# Patient Record
Sex: Male | Born: 1950 | Race: White | Hispanic: No | Marital: Single
Health system: Midwestern US, Community
[De-identification: ages and names within clinical notes are randomized; demographics above are authoritative.]

## PROBLEM LIST (undated history)

## (undated) DIAGNOSIS — Z789 Other specified health status: Secondary | ICD-10-CM

## (undated) DIAGNOSIS — Z515 Encounter for palliative care: Secondary | ICD-10-CM

## (undated) DIAGNOSIS — A09 Infectious gastroenteritis and colitis, unspecified: Secondary | ICD-10-CM

## (undated) DIAGNOSIS — C029 Malignant neoplasm of tongue, unspecified: Principal | ICD-10-CM

## (undated) DIAGNOSIS — K9423 Gastrostomy malfunction: Principal | ICD-10-CM

## (undated) DIAGNOSIS — R911 Solitary pulmonary nodule: Secondary | ICD-10-CM

## (undated) DIAGNOSIS — F320 Major depressive disorder, single episode, mild: Secondary | ICD-10-CM

## (undated) DIAGNOSIS — G893 Neoplasm related pain (acute) (chronic): Secondary | ICD-10-CM

## (undated) DIAGNOSIS — R0602 Shortness of breath: Secondary | ICD-10-CM

## (undated) DIAGNOSIS — C069 Malignant neoplasm of mouth, unspecified: Principal | ICD-10-CM

## (undated) DIAGNOSIS — M545 Low back pain, unspecified: Secondary | ICD-10-CM

## (undated) DIAGNOSIS — I10 Essential (primary) hypertension: Secondary | ICD-10-CM

## (undated) DIAGNOSIS — R918 Other nonspecific abnormal finding of lung field: Secondary | ICD-10-CM

## (undated) DIAGNOSIS — G8929 Other chronic pain: Secondary | ICD-10-CM

## (undated) DIAGNOSIS — M79604 Pain in right leg: Secondary | ICD-10-CM

## (undated) DIAGNOSIS — R52 Pain, unspecified: Secondary | ICD-10-CM

## (undated) DIAGNOSIS — C760 Malignant neoplasm of head, face and neck: Secondary | ICD-10-CM

## (undated) DIAGNOSIS — F5101 Primary insomnia: Secondary | ICD-10-CM

## (undated) DIAGNOSIS — H53413 Scotoma involving central area, bilateral: Secondary | ICD-10-CM

## (undated) DIAGNOSIS — R059 Cough, unspecified: Secondary | ICD-10-CM

## (undated) DIAGNOSIS — C049 Malignant neoplasm of floor of mouth, unspecified: Secondary | ICD-10-CM

## (undated) DIAGNOSIS — K148 Other diseases of tongue: Secondary | ICD-10-CM

## (undated) DIAGNOSIS — M79605 Pain in left leg: Secondary | ICD-10-CM

## (undated) DIAGNOSIS — D0007 Carcinoma in situ of tongue: Secondary | ICD-10-CM

## (undated) DIAGNOSIS — E860 Dehydration: Secondary | ICD-10-CM

## (undated) DIAGNOSIS — K1379 Other lesions of oral mucosa: Secondary | ICD-10-CM

## (undated) DIAGNOSIS — F329 Major depressive disorder, single episode, unspecified: Secondary | ICD-10-CM

## (undated) DIAGNOSIS — J432 Centrilobular emphysema: Secondary | ICD-10-CM

## (undated) DIAGNOSIS — Z01818 Encounter for other preprocedural examination: Secondary | ICD-10-CM

## (undated) DIAGNOSIS — Z720 Tobacco use: Secondary | ICD-10-CM

## (undated) DIAGNOSIS — I071 Rheumatic tricuspid insufficiency: Secondary | ICD-10-CM

## (undated) DIAGNOSIS — J449 Chronic obstructive pulmonary disease, unspecified: Secondary | ICD-10-CM

## (undated) DIAGNOSIS — I519 Heart disease, unspecified: Secondary | ICD-10-CM

## (undated) DIAGNOSIS — R011 Cardiac murmur, unspecified: Secondary | ICD-10-CM

## (undated) DIAGNOSIS — I341 Nonrheumatic mitral (valve) prolapse: Secondary | ICD-10-CM

## (undated) DIAGNOSIS — J189 Pneumonia, unspecified organism: Secondary | ICD-10-CM

## (undated) DIAGNOSIS — R05 Cough: Secondary | ICD-10-CM

## (undated) MED FILL — oxyCODONE HCL TAB 10MG: 10 MG | 15 days supply | Qty: 90 | Fill #0 | Status: AC

## (undated) MED FILL — DIPHENOXYLATE-ATROPI 2.5-0.025 TABS: 2.5-0.025 MG | 15 days supply | Qty: 60 | Fill #0 | Status: AC

## (undated) MED FILL — LORAZEPAM 0.5MG TABS: 0.5 MG | 30 days supply | Qty: 30 | Fill #0 | Status: AC

## (undated) MED FILL — SERTRALINE HCL 20MG/ML CONC: 20 MG/ML | 24 days supply | Qty: 60 | Fill #0 | Status: AC

## (undated) MED FILL — SERTRALINE HCL 20MG/ML CONC: 20 MG/ML | 30 days supply | Qty: 60 | Fill #0 | Status: AC

## (undated) MED FILL — DIPHENOXYLATE-ATROPI 2.5-0.025 TABS: 2.5-0.025 MG | 15 days supply | Qty: 60 | Fill #1 | Status: AC

## (undated) MED FILL — SERTRALINE HCL 20MG/ML CONC: 20 MG/ML | 30 days supply | Qty: 60 | Fill #1 | Status: AC

---

## 2014-08-25 ENCOUNTER — Inpatient Hospital Stay (HOSPITAL_COMMUNITY)
Admission: EM | Admit: 2014-08-25 | Discharge: 2014-08-27 | DRG: 871 | Disposition: A | Discharge status: Home / Self Care | Payer: Medicaid Other | Attending: Internal Medicine | Admitting: Internal Medicine

## 2014-08-25 ENCOUNTER — Emergency Department (HOSPITAL_COMMUNITY): Payer: Medicaid Other

## 2014-08-25 ENCOUNTER — Encounter (HOSPITAL_COMMUNITY): Payer: Self-pay | Admitting: Emergency Medicine

## 2014-08-25 DIAGNOSIS — I071 Rheumatic tricuspid insufficiency: Secondary | ICD-10-CM | POA: Diagnosis present

## 2014-08-25 DIAGNOSIS — J441 Chronic obstructive pulmonary disease with (acute) exacerbation: Secondary | ICD-10-CM | POA: Diagnosis present

## 2014-08-25 DIAGNOSIS — R509 Fever, unspecified: Secondary | ICD-10-CM | POA: Diagnosis present

## 2014-08-25 DIAGNOSIS — E860 Dehydration: Secondary | ICD-10-CM | POA: Diagnosis present

## 2014-08-25 DIAGNOSIS — A419 Sepsis, unspecified organism: Secondary | ICD-10-CM | POA: Diagnosis present

## 2014-08-25 DIAGNOSIS — I519 Heart disease, unspecified: Secondary | ICD-10-CM | POA: Diagnosis present

## 2014-08-25 DIAGNOSIS — Z681 Body mass index (BMI) 19 or less, adult: Secondary | ICD-10-CM | POA: Diagnosis not present

## 2014-08-25 DIAGNOSIS — J9600 Acute respiratory failure, unspecified whether with hypoxia or hypercapnia: Secondary | ICD-10-CM | POA: Diagnosis present

## 2014-08-25 DIAGNOSIS — E41 Nutritional marasmus: Secondary | ICD-10-CM | POA: Diagnosis present

## 2014-08-25 DIAGNOSIS — R0600 Dyspnea, unspecified: Secondary | ICD-10-CM

## 2014-08-25 DIAGNOSIS — E871 Hypo-osmolality and hyponatremia: Secondary | ICD-10-CM | POA: Diagnosis present

## 2014-08-25 DIAGNOSIS — D72829 Elevated white blood cell count, unspecified: Secondary | ICD-10-CM

## 2014-08-25 DIAGNOSIS — R6520 Severe sepsis without septic shock: Secondary | ICD-10-CM

## 2014-08-25 DIAGNOSIS — Z23 Encounter for immunization: Secondary | ICD-10-CM | POA: Diagnosis not present

## 2014-08-25 DIAGNOSIS — R011 Cardiac murmur, unspecified: Secondary | ICD-10-CM | POA: Diagnosis present

## 2014-08-25 DIAGNOSIS — R7309 Other abnormal glucose: Secondary | ICD-10-CM | POA: Diagnosis present

## 2014-08-25 DIAGNOSIS — T380X5A Adverse effect of glucocorticoids and synthetic analogues, initial encounter: Secondary | ICD-10-CM | POA: Diagnosis present

## 2014-08-25 DIAGNOSIS — R0602 Shortness of breath: Secondary | ICD-10-CM | POA: Diagnosis not present

## 2014-08-25 DIAGNOSIS — F17200 Nicotine dependence, unspecified, uncomplicated: Secondary | ICD-10-CM | POA: Diagnosis present

## 2014-08-25 DIAGNOSIS — E861 Hypovolemia: Secondary | ICD-10-CM | POA: Diagnosis present

## 2014-08-25 DIAGNOSIS — J189 Pneumonia, unspecified organism: Secondary | ICD-10-CM | POA: Diagnosis present

## 2014-08-25 DIAGNOSIS — I2789 Other specified pulmonary heart diseases: Secondary | ICD-10-CM | POA: Diagnosis present

## 2014-08-25 DIAGNOSIS — D509 Iron deficiency anemia, unspecified: Secondary | ICD-10-CM | POA: Diagnosis present

## 2014-08-25 DIAGNOSIS — I369 Nonrheumatic tricuspid valve disorder, unspecified: Secondary | ICD-10-CM

## 2014-08-25 DIAGNOSIS — D649 Anemia, unspecified: Secondary | ICD-10-CM | POA: Diagnosis present

## 2014-08-25 DIAGNOSIS — E43 Unspecified severe protein-calorie malnutrition: Secondary | ICD-10-CM | POA: Diagnosis present

## 2014-08-25 DIAGNOSIS — I341 Nonrheumatic mitral (valve) prolapse: Secondary | ICD-10-CM

## 2014-08-25 DIAGNOSIS — R739 Hyperglycemia, unspecified: Secondary | ICD-10-CM | POA: Diagnosis present

## 2014-08-25 DIAGNOSIS — J9601 Acute respiratory failure with hypoxia: Secondary | ICD-10-CM

## 2014-08-25 HISTORY — DX: Heart disease, unspecified: I51.9

## 2014-08-25 HISTORY — DX: Nonrheumatic mitral (valve) prolapse: I34.1

## 2014-08-25 HISTORY — DX: Tobacco use: Z72.0

## 2014-08-25 HISTORY — DX: Pneumonia, unspecified organism: J18.9

## 2014-08-25 HISTORY — DX: Rheumatic tricuspid insufficiency: I07.1

## 2014-08-25 HISTORY — DX: Cardiac murmur, unspecified: R01.1

## 2014-08-25 HISTORY — DX: Cough: R05

## 2014-08-25 HISTORY — DX: Cough, unspecified: R05.9

## 2014-08-25 LAB — COMPREHENSIVE METABOLIC PANEL
ALK PHOS: 107 U/L (ref 39–117)
ALT: 27 U/L (ref 0–53)
AST: 29 U/L (ref 0–37)
Albumin: 3.5 g/dL (ref 3.5–5.2)
Anion gap: 13 (ref 5–15)
BILIRUBIN TOTAL: 0.4 mg/dL (ref 0.3–1.2)
BUN: 25 mg/dL — ABNORMAL HIGH (ref 6–23)
CHLORIDE: 100 meq/L (ref 96–112)
CO2: 23 mEq/L (ref 19–32)
Calcium: 8.7 mg/dL (ref 8.4–10.5)
Creatinine, Ser: 1.02 mg/dL (ref 0.50–1.35)
GFR calc Af Amer: 89 mL/min — ABNORMAL LOW (ref >90)
GFR calc non Af Amer: 77 mL/min — ABNORMAL LOW (ref >90)
Glucose, Bld: 137 mg/dL — ABNORMAL HIGH (ref 70–99)
Potassium: 3.9 mEq/L (ref 3.7–5.3)
SODIUM: 136 meq/L — AB (ref 137–147)
TOTAL PROTEIN: 7.8 g/dL (ref 6.0–8.3)

## 2014-08-25 LAB — CBC WITH DIFFERENTIAL/PLATELET
BASOS PCT: 0 % (ref 0–1)
Basophils Absolute: 0.1 10*3/uL (ref 0.0–0.1)
EOS ABS: 0.1 10*3/uL (ref 0.0–0.7)
Eosinophils Relative: 0 % (ref 0–5)
HCT: 35.7 % — ABNORMAL LOW (ref 39.0–52.0)
HEMOGLOBIN: 12.2 g/dL — AB (ref 13.0–17.0)
LYMPHS ABS: 1.4 10*3/uL (ref 0.7–4.0)
Lymphocytes Relative: 8 % — ABNORMAL LOW (ref 12–46)
MCH: 32.4 pg (ref 26.0–34.0)
MCHC: 34.2 g/dL (ref 30.0–36.0)
MCV: 94.7 fL (ref 78.0–100.0)
Monocytes Absolute: 1 10*3/uL (ref 0.1–1.0)
Monocytes Relative: 6 % (ref 3–12)
NEUTROS PCT: 86 % — AB (ref 43–77)
Neutro Abs: 15.1 10*3/uL — ABNORMAL HIGH (ref 1.7–7.7)
PLATELETS: 351 10*3/uL (ref 150–400)
RBC: 3.77 MIL/uL — AB (ref 4.22–5.81)
RDW: 13.7 % (ref 11.5–15.5)
WBC: 17.6 10*3/uL — ABNORMAL HIGH (ref 4.0–10.5)

## 2014-08-25 LAB — URINALYSIS, ROUTINE W REFLEX MICROSCOPIC
BILIRUBIN URINE: NEGATIVE
Glucose, UA: 100 mg/dL — AB
HGB URINE DIPSTICK: NEGATIVE
Ketones, ur: NEGATIVE mg/dL
Leukocytes, UA: NEGATIVE
Nitrite: NEGATIVE
PROTEIN: NEGATIVE mg/dL
SPECIFIC GRAVITY, URINE: 1.01 (ref 1.005–1.030)
UROBILINOGEN UA: 0.2 mg/dL (ref 0.0–1.0)
pH: 5.5 (ref 5.0–8.0)

## 2014-08-25 LAB — HEMOGLOBIN A1C
HEMOGLOBIN A1C: 5.6 % (ref <5.7)
MEAN PLASMA GLUCOSE: 114 mg/dL (ref <117)

## 2014-08-25 LAB — TROPONIN I: Troponin I: <0.3 ng/mL (ref <0.30)

## 2014-08-25 LAB — TSH: TSH: 1.74 u[IU]/mL (ref 0.350–4.500)

## 2014-08-25 LAB — GLUCOSE, CAPILLARY
GLUCOSE-CAPILLARY: 196 mg/dL — AB (ref 70–99)
Glucose-Capillary: 275 mg/dL — ABNORMAL HIGH (ref 70–99)

## 2014-08-25 LAB — LACTIC ACID, PLASMA: LACTIC ACID, VENOUS: 1.7 mmol/L (ref 0.5–2.2)

## 2014-08-25 LAB — PRO B NATRIURETIC PEPTIDE: Pro B Natriuretic peptide (BNP): 656.8 pg/mL — ABNORMAL HIGH (ref 0–125)

## 2014-08-25 LAB — MRSA PCR SCREENING: MRSA by PCR: POSITIVE — AB

## 2014-08-25 MED ORDER — SODIUM CHLORIDE 0.9 % IV SOLN
INTRAVENOUS | Status: AC
  Administered 2014-08-25: 11:00:00 via INTRAVENOUS

## 2014-08-25 MED ORDER — ONDANSETRON HCL 4 MG PO TABS
4.0000 mg | ORAL_TABLET | Freq: Four times a day (QID) | ORAL | Status: DC | PRN
  Administered 2014-08-26: 4 mg via ORAL
  Filled 2014-08-25: qty 1

## 2014-08-25 MED ORDER — ALBUTEROL SULFATE (2.5 MG/3ML) 0.083% IN NEBU
2.5000 mg | INHALATION_SOLUTION | RESPIRATORY_TRACT | Status: DC | PRN
  Administered 2014-08-25: 2.5 mg via RESPIRATORY_TRACT
  Filled 2014-08-25: qty 3

## 2014-08-25 MED ORDER — SALINE SPRAY 0.65 % NA SOLN: 1.0000 | NASAL | Status: DC | PRN

## 2014-08-25 MED ORDER — SODIUM CHLORIDE 0.9 % IV BOLUS (SEPSIS)
1000.0000 mL | Freq: Once | INTRAVENOUS | Status: AC
  Administered 2014-08-25: 1000 mL via INTRAVENOUS

## 2014-08-25 MED ORDER — ACETAMINOPHEN 325 MG PO TABS
650.0000 mg | ORAL_TABLET | Freq: Four times a day (QID) | ORAL | Status: DC | PRN
  Administered 2014-08-25 – 2014-08-27 (×2): 650 mg via ORAL
  Filled 2014-08-25 (×2): qty 2

## 2014-08-25 MED ORDER — ENOXAPARIN SODIUM 30 MG/0.3ML ~~LOC~~ SOLN
30.0000 mg | SUBCUTANEOUS | Status: DC
  Filled 2014-08-25 (×2): qty 0.3

## 2014-08-25 MED ORDER — PREDNISONE 20 MG PO TABS
40.0000 mg | ORAL_TABLET | Freq: Two times a day (BID) | ORAL | Status: DC
  Administered 2014-08-25 – 2014-08-26 (×2): 40 mg via ORAL
  Filled 2014-08-25 (×2): qty 2

## 2014-08-25 MED ORDER — BENZONATATE 100 MG PO CAPS
100.0000 mg | ORAL_CAPSULE | Freq: Three times a day (TID) | ORAL | Status: DC | PRN
  Administered 2014-08-25: 100 mg via ORAL
  Filled 2014-08-25: qty 1

## 2014-08-25 MED ORDER — ALUM & MAG HYDROXIDE-SIMETH 200-200-20 MG/5ML PO SUSP
30.0000 mL | Freq: Four times a day (QID) | ORAL | Status: DC | PRN
  Administered 2014-08-25 – 2014-08-27 (×3): 30 mL via ORAL
  Filled 2014-08-25 (×3): qty 30

## 2014-08-25 MED ORDER — LEVOFLOXACIN IN D5W 750 MG/150ML IV SOLN: 750.0000 mg | INTRAVENOUS | Status: DC

## 2014-08-25 MED ORDER — DEXAMETHASONE SODIUM PHOSPHATE 10 MG/ML IJ SOLN
8.0000 mg | Freq: Once | INTRAMUSCULAR | Status: AC
  Administered 2014-08-25: 8 mg via INTRAVENOUS
  Filled 2014-08-25: qty 1

## 2014-08-25 MED ORDER — OXYMETAZOLINE HCL 0.05 % NA SOLN
NASAL | Status: AC
  Filled 2014-08-25: qty 15

## 2014-08-25 MED ORDER — IPRATROPIUM-ALBUTEROL 0.5-2.5 (3) MG/3ML IN SOLN
3.0000 mL | Freq: Once | RESPIRATORY_TRACT | Status: AC
  Administered 2014-08-25: 3 mL via RESPIRATORY_TRACT
  Filled 2014-08-25: qty 3

## 2014-08-25 MED ORDER — NICOTINE 21 MG/24HR TD PT24
21.0000 mg | MEDICATED_PATCH | Freq: Every day | TRANSDERMAL | Status: DC
  Administered 2014-08-25 – 2014-08-27 (×3): 21 mg via TRANSDERMAL
  Filled 2014-08-25 (×3): qty 1

## 2014-08-25 MED ORDER — MUPIROCIN 2 % EX OINT
1.0000 "application " | TOPICAL_OINTMENT | Freq: Two times a day (BID) | CUTANEOUS | Status: DC
  Administered 2014-08-25 – 2014-08-27 (×4): 1 via NASAL
  Filled 2014-08-25: qty 22

## 2014-08-25 MED ORDER — BISACODYL 10 MG RE SUPP: 10.0000 mg | Freq: Every day | RECTAL | Status: DC | PRN

## 2014-08-25 MED ORDER — ACETAMINOPHEN 650 MG RE SUPP: 650.0000 mg | Freq: Four times a day (QID) | RECTAL | Status: DC | PRN

## 2014-08-25 MED ORDER — CHLORHEXIDINE GLUCONATE CLOTH 2 % EX PADS
6.0000 | MEDICATED_PAD | Freq: Every day | CUTANEOUS | Status: DC
  Administered 2014-08-26 – 2014-08-27 (×2): 6 via TOPICAL

## 2014-08-25 MED ORDER — LEVOFLOXACIN IN D5W 750 MG/150ML IV SOLN
750.0000 mg | Freq: Once | INTRAVENOUS | Status: AC
  Administered 2014-08-25: 750 mg via INTRAVENOUS
  Filled 2014-08-25: qty 150

## 2014-08-25 MED ORDER — ACETAMINOPHEN 500 MG PO TABS
1000.0000 mg | ORAL_TABLET | Freq: Once | ORAL | Status: AC
  Administered 2014-08-25: 1000 mg via ORAL
  Filled 2014-08-25: qty 2

## 2014-08-25 MED ORDER — HYDROCODONE-ACETAMINOPHEN 5-325 MG PO TABS
1.0000 | ORAL_TABLET | ORAL | Status: DC | PRN
  Administered 2014-08-26 (×2): 1 via ORAL
  Filled 2014-08-25 (×2): qty 1

## 2014-08-25 MED ORDER — INSULIN ASPART 100 UNIT/ML ~~LOC~~ SOLN
0.0000 [IU] | Freq: Three times a day (TID) | SUBCUTANEOUS | Status: DC
  Administered 2014-08-25: 8 [IU] via SUBCUTANEOUS
  Administered 2014-08-26 (×2): 3 [IU] via SUBCUTANEOUS
  Administered 2014-08-26: 2 [IU] via SUBCUTANEOUS

## 2014-08-25 MED ORDER — ENOXAPARIN SODIUM 40 MG/0.4ML ~~LOC~~ SOLN: 40.0000 mg | SUBCUTANEOUS | Status: DC

## 2014-08-25 MED ORDER — INSULIN ASPART 100 UNIT/ML ~~LOC~~ SOLN
0.0000 [IU] | Freq: Every day | SUBCUTANEOUS | Status: DC
  Administered 2014-08-25: 3 [IU] via SUBCUTANEOUS

## 2014-08-25 MED ORDER — ONDANSETRON HCL 4 MG/2ML IJ SOLN: 4.0000 mg | Freq: Four times a day (QID) | INTRAMUSCULAR | Status: DC | PRN

## 2014-08-25 MED ORDER — INSULIN DETEMIR 100 UNIT/ML ~~LOC~~ SOLN
5.0000 [IU] | Freq: Two times a day (BID) | SUBCUTANEOUS | Status: DC
  Administered 2014-08-25 – 2014-08-26 (×3): 5 [IU] via SUBCUTANEOUS
  Filled 2014-08-25 (×8): qty 0.05

## 2014-08-25 MED ORDER — ALBUTEROL SULFATE (2.5 MG/3ML) 0.083% IN NEBU
2.5000 mg | INHALATION_SOLUTION | RESPIRATORY_TRACT | Status: DC | PRN
Start: 2014-08-25 — End: 2014-08-27

## 2014-08-25 MED ORDER — OXYMETAZOLINE HCL 0.05 % NA SOLN
1.0000 | Freq: Two times a day (BID) | NASAL | Status: DC | PRN
  Administered 2014-08-25: 1 via NASAL
  Filled 2014-08-25: qty 15

## 2014-08-25 MED ORDER — IPRATROPIUM-ALBUTEROL 0.5-2.5 (3) MG/3ML IN SOLN
3.0000 mL | RESPIRATORY_TRACT | Status: DC
  Administered 2014-08-25 – 2014-08-26 (×7): 3 mL via RESPIRATORY_TRACT
  Filled 2014-08-25 (×7): qty 3

## 2014-08-25 MED ORDER — TRAZODONE HCL 50 MG PO TABS
25.0000 mg | ORAL_TABLET | Freq: Every evening | ORAL | Status: DC | PRN
  Administered 2014-08-26 (×2): 25 mg via ORAL
  Filled 2014-08-25 (×2): qty 1

## 2014-08-25 MED ORDER — SODIUM CHLORIDE 0.9 % IV SOLN
INTRAVENOUS | Status: DC
  Administered 2014-08-26 – 2014-08-27 (×2): via INTRAVENOUS

## 2014-08-25 NOTE — ED Provider Notes (Signed)
CSN: [id number]     Arrival date & time 08/25/14  4098 History  This chart was scribed for Enid Skeens, MD by Littie Deeds, ED Scribe. This patient was seen in room APA11/APA11 and the patient's care was started at 8:07 AM.     Chief Complaint  Patient presents with  . Shortness of Breath      Patient is a 63 y.o. male presenting with shortness of breath. The history is provided by the patient. No language interpreter was used.  Shortness of Breath Severity:  Severe Onset quality:  Gradual Duration:  1 month Timing:  Constant Progression:  Worsening Worsened by:  Exertion Associated symptoms: cough, diaphoresis and fever   Associated symptoms: no abdominal pain, no chest pain, no headaches and no sore throat   Risk factors: tobacco use    HPI Comments: Vincent Robertson is a 63 y.o. male who presents to the Emergency Department complaining of gradually worsening, severe, constant, SOB that started a month ago, but has worsened the past two days. He notes associated chills, fever, rhinorrhea, diaphoresis, throbbing back pain, sore throat, weakness and productive cough with greenish yellow sputum. Patient states that SOB is worsened with activity. Patient has not been seen by a PCP in 15 years. He denies Hx of lung dz, surgery, CA, and blood clots.  He denies CP, leg edema and abdominal pain. Patient has been smoking for about 40 years.   History reviewed. No pertinent past medical history. History reviewed. No pertinent past surgical history. History reviewed. No pertinent family history. History  Substance Use Topics  . Smoking status: Current Some Day Smoker -- 0.50 packs/day    Types: Cigarettes  . Smokeless tobacco: Not on file  . Alcohol Use: Yes    Review of Systems  Constitutional: Positive for fever, chills, diaphoresis and fatigue.  HENT: Positive for rhinorrhea. Negative for sore throat.   Eyes: Negative for visual disturbance.  Respiratory: Positive for cough  and shortness of breath.   Cardiovascular: Negative for chest pain.  Gastrointestinal: Negative for abdominal pain.  Musculoskeletal: Positive for back pain.  Neurological: Positive for light-headedness. Negative for headaches.  All other systems reviewed and are negative.     Allergies  Review of patient's allergies indicates no known allergies.  Home Medications   Prior to Admission medications   Not on File   BP 142/106  Pulse 117  Temp(Src) 98.8 F (37.1 C) (Oral)  Resp 24  Ht  (1.676 m)  Wt 100 lb (45.36 kg)  BMI 16.15 kg/m2  SpO2 92% Physical Exam  Nursing note and vitals reviewed. Constitutional: He is oriented to person, place, and time. He appears well-developed and well-nourished. No distress.  HENT:  Head: Normocephalic and atraumatic.  Mouth/Throat: No oropharyngeal exudate.  Mild dry mucous membranes  Eyes: Pupils are equal, round, and reactive to light.  Neck: Neck supple.  Cardiovascular: Normal rate.   Murmur heard. 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 He has wheezes. He has rales.  Mild tachypnea, mild decreased air movement bilaterally  Musculoskeletal: He exhibits no edema and no tenderness.  no calf tenderness edema bilaterally  Neurological: He is alert and oriented to person, place, and time. No cranial nerve deficit.  Skin: Skin is warm and dry. No rash noted.  Psychiatric: He has a normal mood and affect. His behavior is normal.    ED Course  Procedures    EMERGENCY DEPARTMENT [place] CARDIAC EXAM "Study: Limited Ultrasound of the heart and pericardium"  INDICATIONS:Dyspnea Multiple views of the heart and pericardium were obtained in real-time with a multi-frequency probe.  PERFORMED ZO:XWRUEA  IMAGES ARCHIVED?: Yes  FINDINGS: No pericardial effusion, Hyperdynamic contractility and Tamponade physiology absent  LIMITATIONS:   body habitus  VIEWS USED: Subcostal 4 chamber, Parasternal long axis, Parasternal short axis, Apical 4 chamber  and Inferior Vena Cava  INTERPRETATION: Cardiac activity present, Pericardial effusioin absent, Cardiac tamponade absent, Probable low CVP and Increased contractility   DIAGNOSTIC STUDIES: Oxygen Saturation is 92% on RA, adequate by my interpretation.    COORDINATION OF CARE: 8:19 AM-Discussed treatment plan which includes breathing Tx, labs, CXR with pt at bedside and pt agreed to plan.     Labs Review Labs Reviewed  CBC WITH DIFFERENTIAL - Abnormal; Notable for the following:    WBC 17.6 (*)    RBC 3.77 (*)    Hemoglobin 12.2 (*)    HCT 35.7 (*)    Neutrophils Relative % 86 (*)    Neutro Abs 15.1 (*)    Lymphocytes Relative 8 (*)    All other components within normal limits  COMPREHENSIVE METABOLIC PANEL - Abnormal; Notable for the following:    Sodium 136 (*)    Glucose, Bld 137 (*)    BUN 25 (*)    GFR calc non Af Amer 77 (*)    GFR calc Af Amer 89 (*)    All other components within normal limits  PRO B NATRIURETIC PEPTIDE - Abnormal; Notable for the following:    Pro B Natriuretic peptide (BNP) 656.8 (*)    All other components within normal limits  TROPONIN I  LACTIC ACID, PLASMA  LACTIC ACID, PLASMA  I-STAT CG4 LACTIC ACID, ED    Imaging Review Dg Chest 2 View  08/25/2014   CLINICAL DATA:  Cough, congestion  EXAM: CHEST  2 VIEW  COMPARISON:  None.  FINDINGS: Cardiomediastinal silhouette is unremarkable. Hyperinflation is noted. Mild perihilar increased bronchial markings. There is patchy infiltrate/pneumonia in left upper lobe. Follow-up to resolution is recommended.  IMPRESSION: Hyperinflation. Patchy infiltrate/ pneumonia in left upper lobe. Follow-up to resolution is recommended.   Electronically Signed   By: Natasha Mead M.D.   On: 08/25/2014 09:28     EKG Interpretation   Date/Time:  Thursday August 25 2014 09:05:25 EDT Ventricular Rate:   92 PR Interval:  112 QRS Duration: 94 QT Interval:  353 QTC Calculation: 437 R Axis:   157 Text Interpretation:  Sinus rhythm Borderline short PR interval Probable  left atrial enlargement Consider left ventricular hypertrophy  Probable   LVH Baseline wander in lead(s) V4 V5 V6 Confirmed by Theia Dezeeuw  MD, Loria Lacina  ([id number]) on 08/25/2014 9:11:35 AM      MDM   Final diagnoses:  CAP (community acquired pneumonia)  Acute dyspnea  Heart murmur  Sepsis due to pneumonia   Clinically patient has not seen a physician in many years presents with clinically underlying lung disease and bronchitis/pneumonia. Plan for blood work, chest x-ray, afebrile however tachycardic. With exertional shortness of breath, age, smoker and murmur since childhood plan for evaluation for signs of heart failure as well and cardiac strain.  No blood clot risk factors.  Patient has significant murmur left lower sternal border holosystolic with possible diastolic component. Patient says she's had a unique murmur for years and has been evaluated in the past. No current cardiologist. No heart failure  known, patient denies orthopnea or weight gain. Bedside ultrasound showed hyperdynamic heart with likely mitral regurgitation, grossly normal ejection fraction, IVC collapsing with respirations.  Chest x-ray reviewed by myself left upper infiltrate concerning for pneumonia with chronic lung disease appearance. Patient had improvement on recheck however still has mild tachypnea, mild low oxygenation in with likely lung disease and heart murmur plan for admission for further evaluation and treatment of pneumonia/sepsis.  Discussed with Dr. Sherrie Mustache who agrees with plan and stepped-down placed.  The patients results and plan were reviewed and discussed.   Any x-rays performed were personally reviewed by myself.   Differential diagnosis were considered with the presenting HPI.  Medications  levofloxacin (LEVAQUIN) IVPB 750 mg (750 mg  Intravenous New Bag/Given 08/25/14 1011)  0.9 %  sodium chloride infusion ( Intravenous New Bag/Given 08/25/14 1115)  albuterol (PROVENTIL) (2.5 MG/3ML) 0.083% nebulizer solution 2.5 mg (not administered)  ipratropium-albuterol (DUONEB) 0.5-2.5 (3) MG/3ML nebulizer solution 3 mL (not administered)  ipratropium-albuterol (DUONEB) 0.5-2.5 (3) MG/3ML nebulizer solution 3 mL (3 mLs Nebulization Given 08/25/14 0843)  dexamethasone (DECADRON) injection 8 mg (8 mg Intravenous Given 08/25/14 0836)  sodium chloride 0.9 % bolus 1,000 mL (0 mLs Intravenous Stopped 08/25/14 1114)  acetaminophen (TYLENOL) tablet 1,000 mg (1,000 mg Oral Given 08/25/14 1041)    Filed Vitals:   08/25/14 0951 08/25/14 0957 08/25/14 1041 08/25/14 1100  BP: 159/67 145/65 146/66 136/63  Pulse: 92 89 91 81  Temp:  102.7 F (39.3 C)    TempSrc:  Rectal    Resp: Height:      Weight:      SpO2: 94%  95% 96%    Final diagnoses:  CAP (community acquired pneumonia)  Acute dyspnea  Heart murmur  Sepsis due to pneumonia    Admission/ observation were discussed with the admitting physician, patient and/or family and they are comfortable with the plan.     Enid Skeens, MD 08/25/14 [phone]

## 2014-08-25 NOTE — ED Notes (Signed)
Fever of 102 Rectal. Warm blankets removed from pt and sheet given to pt. NAD.

## 2014-08-25 NOTE — H&P (Signed)
The patient was seen and examined. His medical record, vital signs, and laboratory studies were reviewed. He was discussed with nurse practitioner Ms. Black. I agree with her findings with additions below.  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 He also has a history of smoking one to 2 packs of cigarettes daily. He is being admitted for community-acquired pneumonia and COPD with exacerbation. Levaquin and dual nebs have been started. He was given a small dose of Decadron in the ED, but we will start a prednisone taper. We will add a nicotine patch and tobacco cessation counseling. He was instructed to stop smoking. We'll add when necessary Tessalon Perles. 2-D echocardiogram has been ordered for evaluation of a heart murmur.

## 2014-08-25 NOTE — Progress Notes (Signed)
ANTIBIOTIC CONSULT NOTE  Pharmacy Consult for Levaquin Indication: pneumonia  No Known Allergies  Patient Measurements: Height:  (167.6 cm) Weight: 92 lb 9.5 oz (42 kg) IBW/kg (Calculated) : 63.8  Vital Signs: Temp: 98.2 F (36.8 C) (09/17 1227) Temp src: Oral (09/17 1227) BP: 136/63 mmHg (09/17 1100) Pulse Rate: 90 (09/17 1130) Intake/Output from previous day:   Intake/Output from this shift: Total I/O In: 1000 [I.V.:1000] Out: -   Labs:  Recent Labs  08/25/14 0838  WBC 17.6*  HGB 12.2*  PLT 351  CREATININE 1.02   Estimated Creatinine Clearance: 44.6 ml/min (by C-G formula based on Cr of 1.02). No results found for this basename: VANCOTROUGH, VANCOPEAK, VANCORANDOM, GENTTROUGH, GENTPEAK, GENTRANDOM, TOBRATROUGH, TOBRAPEAK, TOBRARND, AMIKACINPEAK, AMIKACINTROU, AMIKACIN,  in the last 72 hours   Microbiology: No results found for this or any previous visit (from the past 720 hour(s)).  Anti-infectives   Start     Dose/Rate Route Frequency Ordered Stop   08/25/14 1000  levofloxacin (LEVAQUIN) IVPB 750 mg     750 mg 100 mL/hr over 90 Minutes Intravenous  Once 08/25/14 1610 08/25/14 1142     Assessment: Sepsis: Likely related to community-acquired pneumonia in the setting of probable COPD exacerbation and a heavy smoker  Goal of Therapy:  Eradicate infection.   Plan:  Levaquin  IV every 48 hours. Follow up culture results  Mady Gemma 08/25/2014,12:57 PM

## 2014-08-25 NOTE — ED Notes (Signed)
Pt co productive cough with green sputum, heart racing, and cold chills x3 days.

## 2014-08-25 NOTE — Progress Notes (Signed)
  Echocardiogram 2D Echocardiogram has been performed.  Vincent Robertson 08/25/2014, 4:03 PM 

## 2014-08-25 NOTE — H&P (Signed)
[facility] History and Physical  Vincent Robertson ZOX:[id number] DOB: May 13, 1951 DOA: 08/25/2014  Referring physician:  PCP: No PCP Per Patient   Chief Complaint: Worsening shortness of breath  HPI: Vincent Robertson is a 63 y.o. male who has a history of smoking and no medical care for over 10 years presents to the emergency department with the chief complaint of worsening dyspnea on exertion. Initial evaluation in the emergency room reveals sepsis related to community acquired pneumonia in the setting of likely COPD exacerbation resulting resulting in acute respiratory failure.  Patient reports a gradual worsening of dyspnea on exertion over the last 4-5 weeks. Over the last 2 days DOB developed into shortness of breath at rest with dry nonproductive cough. Associated symptoms include chills, subjective fever and generalized weakness. He denies chest pain palpitations headache dizziness syncope or near-syncope. He denies any abdominal pain nausea vomiting diarrhea constipation or melena. He denies dysuria hematuria frequency or urgency. He denies lower extremity edema but describes an intermittent orthopnea. He denies any unintentional weight loss  Workup in the emergency room reveals a chest x-ray with Hyperinflation. Patchy infiltrate/ pneumonia in left upper lobe, leukocytosis of 17.6 with 86% relative neutrophils, proBNP of 656.8, lactic acid 1.7. Basic metabolic panel with sodium and 136 BUN and 25 serum glucose 137. Initial troponin negative. EKG with normal sinus rhythm and probable left ventricular hypertrophy. Vital signs reveal a rectal temperature of 102.7. He has hemodynamically stable and mildly hypoxic with tachypnea.  He is provided with 2 L of normal saline, 80 mg of Decadron intravenously, nebulizer and Levaquin. The time of my exam he is nontoxic appearing  Review of Systems:  10 point review of systems completed and all systems are negative except as indicated in  the history of present illness   Past Medical History  Diagnosis Date  . Tobacco use   . Murmur   . Cough    History reviewed. No pertinent past surgical history. Social History:  reports that he has been smoking Cigarettes.  He has been smoking about 0.50 packs per day. He does not have any smokeless tobacco history on file. He reports that he drinks alcohol. His drug history is not on file.  No Known Allergies  History reviewed. No pertinent family history.   Prior to Admission medications   Medication Sig Start Date End Date Taking? Authorizing Provider  OVER THE COUNTER MEDICATION Take 30 mLs by mouth every 6 (six) hours as needed (cough). OTC cough medication   Yes Historical Provider, MD   Physical Exam: Filed Vitals:   08/25/14 1115 08/25/14 1130 08/25/14 1137 08/25/14 1149  BP:      Pulse: 83 90    Temp:   97.8 F (36.6 C)   TempSrc:   Oral   Resp: 25 22    Height:      Weight:      SpO2: 95% 96%  97%    Wt Readings from Last 3 Encounters:  08/25/14 45.36 kg (100 lb)    General:  Appears calm and comfortable . In Eyes: PERRL, normal lids, irises & conjunctiva ENT: grossly normal hearing, very poor dentition mucous membranes of his mouth slightly pale slightly dry Neck: no LAD, masses or thyromegaly Cardiovascular: S1 and S2 +murmur no gallup or rub no lower extremity pedal pulses are present and palpable Respiratory: Normal effort breath sounds very diminished. Faint wheezes diffusely. No Abdomen: soft, flat positive bowel sounds throughout nontender to palpate Skin: no rash or  induration seen on limited exam Musculoskeletal: grossly normal tone BUE/BLE Psychiatric: grossly normal mood and affect, speech fluent and appropriate Neurologic: grossly non-focal. Speech clear facial symmetry           Labs on Admission:  Basic Metabolic Panel:  Recent Labs Lab 08/25/14 0838  NA 136*  K 3.9  CL 100  CO2 23  GLUCOSE 137*  BUN 25*  CREATININE 1.02    CALCIUM 8.7   Liver Function Tests:  Recent Labs Lab 08/25/14 0838  AST 29  ALT 27  ALKPHOS 107  BILITOT 0.4  PROT 7.8  ALBUMIN 3.5   No results found for this basename: LIPASE, AMYLASE,  in the last 168 hours No results found for this basename: AMMONIA,  in the last 168 hours CBC:  Recent Labs Lab 08/25/14 0838  WBC 17.6*  NEUTROABS 15.1*  HGB 12.2*  HCT 35.7*  MCV 94.7  PLT 351   Cardiac Enzymes:  Recent Labs Lab 08/25/14 0838  TROPONINI <0.30    BNP (last 3 results)  Recent Labs  08/25/14 0838  PROBNP 656.8*   CBG: No results found for this basename: GLUCAP,  in the last 168 hours  Radiological Exams on Admission: Dg Chest 2 View  08/25/2014   CLINICAL DATA:  Cough, congestion  EXAM: CHEST  2 VIEW  COMPARISON:  None.  FINDINGS: Cardiomediastinal silhouette is unremarkable. Hyperinflation is noted. Mild perihilar increased bronchial markings. There is patchy infiltrate/pneumonia in left upper lobe. Follow-up to resolution is recommended.  IMPRESSION: Hyperinflation. Patchy infiltrate/ pneumonia in left upper lobe. Follow-up to resolution is recommended.   Electronically Signed   By: Natasha Mead M.D.   On: 08/25/2014 09:28    EKG: Independently reviewed. SR with LVH  Assessment/Plan Principal Problem:   Sepsis: Likely related to community-acquired pneumonia in the setting of probable COPD exacerbation and a heavy smoker. Patient with a history of heart murmur so some concern for some degree of heart failure. At the time of my exam he is afebrile. He has remained hemodynamically stable and only mildly hypoxic.Tachypnea  much improved. Will obtain blood cultures urine cultures sputum cultures. Will obtain Legionella urine antigen and strep pneumo urinary antigen. Will also obtain 2-D echo for further evaluation of murmur and reported prolapse valve. Will provide supportive therapy in the form of IV fluids and antibiotics as well as oxygen supplementation as  indicated. Admitted to step down unit for close monitoring Active Problems:    CAP (community acquired pneumonia): See 1. Will obtain blood cultures sputum cultures. Continue Levaquin that was initiated in the emergency department. Check strep pneumo urinary antigen and Legionella urine antigen. Surgeon supplementation as indicated.   COPD exacerbation: A heavy smoker. He hasn't been to the doctor in many years but likely chronic disease due to smoking. Will provide scheduled nebulizer treatment. Antibiotics as indicated above. He was provided with Decadron in the emergency department. Will consider prednisone if no improvement. He will likely need pulmonary function tests in the outpatient basis.     Acute respiratory failure with hypoxia: Rated to #2 and #3. Hypoxia mild. Improved at the time of my exam after nabs and Decadron provided in the emergency department. Oxygen saturation levels 93% on 2 L of nasal cannula. See therapy for #12 and 3. Monitor closely on the step down unit. Patient what he has described he may be in need of oxygen on the outpatient basis with activity. Once acute phase of illness resolved we'll conduct oxygen trials  Systolic murmur: Patient reports history of same. Will obtain a 2-D echo for further evaluation.    Fever, unspecified: See #1. Tylenol as indicated. Await blood cultures    Hyponatremia: Mild. Likely do to decreased by mouth intake over the last 2 days. Will gently hydrate with IV fluids. Will recheck in the morning    Dehydration: Patient reports decreased by mouth intake on the last 2-3 days. Will provide IV fluids.    Hyperglycemia: I clearly related to infectious process. No history of diabetes. Will check A1c. Monitor CBGs    Tobacco use disorder: Cessation counseling offered    Code Status: full DVT Prophylaxis: Family Communication: fiance at bedside Disposition Plan: home when ready  Time spent: 65 minutes  [facility] Pager [phone]

## 2014-08-26 ENCOUNTER — Encounter (HOSPITAL_COMMUNITY): Payer: Self-pay | Admitting: Internal Medicine

## 2014-08-26 DIAGNOSIS — J9600 Acute respiratory failure, unspecified whether with hypoxia or hypercapnia: Secondary | ICD-10-CM

## 2014-08-26 DIAGNOSIS — I341 Nonrheumatic mitral (valve) prolapse: Secondary | ICD-10-CM

## 2014-08-26 DIAGNOSIS — D649 Anemia, unspecified: Secondary | ICD-10-CM

## 2014-08-26 DIAGNOSIS — I071 Rheumatic tricuspid insufficiency: Secondary | ICD-10-CM

## 2014-08-26 DIAGNOSIS — I519 Heart disease, unspecified: Secondary | ICD-10-CM

## 2014-08-26 HISTORY — DX: Nonrheumatic mitral (valve) prolapse: I34.1

## 2014-08-26 HISTORY — DX: Heart disease, unspecified: I51.9

## 2014-08-26 HISTORY — DX: Rheumatic tricuspid insufficiency: I07.1

## 2014-08-26 LAB — CBC
HCT: 31 % — ABNORMAL LOW (ref 39.0–52.0)
HEMOGLOBIN: 10.4 g/dL — AB (ref 13.0–17.0)
MCH: 31.9 pg (ref 26.0–34.0)
MCHC: 33.5 g/dL (ref 30.0–36.0)
MCV: 95.1 fL (ref 78.0–100.0)
Platelets: 317 10*3/uL (ref 150–400)
RBC: 3.26 MIL/uL — ABNORMAL LOW (ref 4.22–5.81)
RDW: 13.8 % (ref 11.5–15.5)
WBC: 19.1 10*3/uL — ABNORMAL HIGH (ref 4.0–10.5)

## 2014-08-26 LAB — COMPREHENSIVE METABOLIC PANEL
ALK PHOS: 96 U/L (ref 39–117)
ALT: 20 U/L (ref 0–53)
AST: 18 U/L (ref 0–37)
Albumin: 2.8 g/dL — ABNORMAL LOW (ref 3.5–5.2)
Anion gap: 12 (ref 5–15)
BUN: 21 mg/dL (ref 6–23)
CO2: 21 meq/L (ref 19–32)
Calcium: 7.9 mg/dL — ABNORMAL LOW (ref 8.4–10.5)
Chloride: 105 mEq/L (ref 96–112)
Creatinine, Ser: 0.95 mg/dL (ref 0.50–1.35)
GFR, EST NON AFRICAN AMERICAN: 87 mL/min — AB (ref >90)
Glucose, Bld: 167 mg/dL — ABNORMAL HIGH (ref 70–99)
POTASSIUM: 3.9 meq/L (ref 3.7–5.3)
SODIUM: 138 meq/L (ref 137–147)
Total Bilirubin: 0.2 mg/dL — ABNORMAL LOW (ref 0.3–1.2)
Total Protein: 6.3 g/dL (ref 6.0–8.3)

## 2014-08-26 LAB — IRON AND TIBC
Iron: 11 ug/dL — ABNORMAL LOW (ref 42–135)
Saturation Ratios: 4 % — ABNORMAL LOW (ref 20–55)
TIBC: 257 ug/dL (ref 215–435)
UIBC: 246 ug/dL (ref 125–400)

## 2014-08-26 LAB — GLUCOSE, CAPILLARY
GLUCOSE-CAPILLARY: 191 mg/dL — AB (ref 70–99)
Glucose-Capillary: 146 mg/dL — ABNORMAL HIGH (ref 70–99)
Glucose-Capillary: 151 mg/dL — ABNORMAL HIGH (ref 70–99)

## 2014-08-26 LAB — LEGIONELLA ANTIGEN, URINE: LEGIONELLA ANTIGEN, URINE: NEGATIVE

## 2014-08-26 LAB — VITAMIN B12: VITAMIN B 12: 971 pg/mL — AB (ref 211–911)

## 2014-08-26 LAB — STREP PNEUMONIAE URINARY ANTIGEN: STREP PNEUMO URINARY ANTIGEN: NEGATIVE

## 2014-08-26 LAB — FERRITIN: Ferritin: 82 ng/mL (ref 22–322)

## 2014-08-26 MED ORDER — BOOST HIGH PROTEIN PO LIQD
237.0000 mL | Freq: Three times a day (TID) | ORAL | Status: DC
  Administered 2014-08-26 – 2014-08-27 (×3): 1 via ORAL
  Filled 2014-08-26 (×9): qty 1

## 2014-08-26 MED ORDER — PNEUMOCOCCAL VAC POLYVALENT 25 MCG/0.5ML IJ INJ
0.5000 mL | INJECTION | INTRAMUSCULAR | Status: AC
  Administered 2014-08-27: 0.5 mL via INTRAMUSCULAR
  Filled 2014-08-26: qty 0.5

## 2014-08-26 MED ORDER — PREDNISONE 20 MG PO TABS
30.0000 mg | ORAL_TABLET | Freq: Two times a day (BID) | ORAL | Status: DC
  Administered 2014-08-26 – 2014-08-27 (×2): 30 mg via ORAL
  Filled 2014-08-26 (×4): qty 1

## 2014-08-26 MED ORDER — LEVOFLOXACIN IN D5W 750 MG/150ML IV SOLN
750.0000 mg | INTRAVENOUS | Status: DC
  Administered 2014-08-27: 750 mg via INTRAVENOUS
  Filled 2014-08-26: qty 150

## 2014-08-26 MED ORDER — INFLUENZA VAC SPLIT QUAD 0.5 ML IM SUSY
0.5000 mL | PREFILLED_SYRINGE | INTRAMUSCULAR | Status: AC
  Administered 2014-08-27: 0.5 mL via INTRAMUSCULAR
  Filled 2014-08-26: qty 0.5

## 2014-08-26 NOTE — Plan of Care (Signed)
Problem: Phase I Progression Outcomes Goal: Dyspnea controlled at rest Outcome: Progressing Patient has no complaint of dyspnea  Goal: Pain controlled with appropriate interventions Outcome: Completed/Met Date Met:  08/26/14 No complaints of pain, has PRN medication if needed. Goal: OOB as tolerated unless otherwise ordered Outcome: Progressing OOB with BRP; unsteady on his feet, 1 assist Goal: Code status addressed with pt/family Outcome: Completed/Met Date Met:  08/26/14 Full code Goal: Initial discharge plan identified Outcome: Completed/Met Date Met:  08/26/14 Home with family Goal: Voiding-avoid urinary catheter unless indicated Outcome: Completed/Met Date Met:  08/26/14 Voids without difficulty Goal: Hemodynamically stable Outcome: Progressing Currently vital signs are stable

## 2014-08-26 NOTE — Progress Notes (Signed)
UR chart review completed.  

## 2014-08-26 NOTE — Progress Notes (Signed)
INITIAL NUTRITION ASSESSMENT  DOCUMENTATION CODES Per approved criteria  -Severe malnutrition in the context of social and environmental circumstances   Pt meets criteria for severe MALNUTRITION in the context of social and environmental circumstances as evidenced by severe fat and muscle depletion.  INTERVENTION: Boost TID  NUTRITION DIAGNOSIS: Inadequate oral intake related to disordered eating pattern as evidenced by diet hx, severe fat and muscle depletion.   Goal: Pt will meet >90% of estimated nutritional needs  Monitor:  PO/supplement intake, labs, weight changes, I/O's  Reason for Assessment: Low BMI  63 y.o. male  Admitting Dx: Sepsis  Vincent Robertson is a 63 y.o. male who presents to the Emergency Department complaining of gradually worsening, severe, constant, SOB that started a month ago, but has worsened the past two days. He notes associated chills, fever, rhinorrhea, diaphoresis, throbbing back pain, sore throat, weakness and productive cough with greenish yellow sputum. Patient states that SOB is worsened with activity. Patient has not been seen by a PCP in 15 years. He denies Hx of lung dz, surgery, CA, and blood clots.   ASSESSMENT: Pt admitted with sepsis and acute respiratory failure with hypoxia. He has not sought medical care in over 10 years.  Spoke with RN who reports pt is doing well and ate 100% of breakfast this AM.  Hx obtained by pt and fiance at bedside. 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 Reports UBW of 115#. He reveals his highest weight was 145#, which he last weighed in his 20's, when he enlisted in the [employer].  He reports nutritional status has improved greatly over the past 4 months, as his fiance cooks for him and eats a well balanced diet. His only complaint is he can never eat enough. He is constantly snacking on oreos, potato chips, and yogurt at night. Prior to this, however, he reports he would generally eat only one meal  per day. He has since gained weight- he reports that he weighed about 96# prior to this.  He has not taken nutritional supplements recently, but has tried Boost in the past and liked the chocolate flavor. He is agreeable to drink during hospitalization. Stressed importance of continued good nutrition in hospital and after discharge to promote healing and preserve muscle mass. Also discussed ways pt could include more protein in his diet by choosing healthier snack items such as yogurt, peanut butter, cheese, and lean proteins. Pt and fiance very grateful and agreeable to recommendations.   Labs reviewed. Glucose: 167. CBGS: 146-275.Elevated glucose likely due to initiation of steroids.  Nutrition Focused Physical Exam:  Subcutaneous Fat:  Orbital Region: severe depletion  Upper Arm Region: severe depletion  Thoracic and Lumbar Region: severe depletion  Muscle:  Temple Region: severe depeltion Clavicle Bone Region: severe depletion Clavicle and Acromion Bone Region: severe depeltion Scapular Bone Region: severe depletion Dorsal Hand: severe depletion Patellar Region: severe depletion Anterior Thigh Region: severe depletion Posterior Calf Region: severe depletion  Edema: none present  Height: Ht Readings from Last 1 Encounters:  08/25/14  (1.676 m)    Weight: Wt Readings from Last 1 Encounters:  08/26/14 100 lb 12 oz (45.7 kg)    Ideal Body Weight: 154#  % Ideal Body Weight: 65%  Wt Readings from Last 10 Encounters:  08/26/14 100 lb 12 oz (45.7 kg)    Usual Body Weight: 115#  % Usual Body Weight: 87%  BMI:  Body mass index is 16.27 kg/(m^2). Underweight  Estimated Nutritional Needs:  Kcal: 1371-1600 Protein: 57-67 grams Fluid: 1.4-1.6 L  Skin: WDL  Diet Order: Cardiac  EDUCATION NEEDS: -Education needs addressed   Intake/Output Summary (Last 24 hours) at 08/26/14 0937 Last data filed at 08/26/14 0800  Gross per 24 hour  Intake   4105 ml  Output      0  ml  Net   4105 ml    Last BM: 08/24/14  Labs:   Recent Labs Lab 08/25/14 0838 08/26/14 0437  NA 136* 138  K 3.9 3.9  CL 100 105  CO2 23 21  BUN 25* 21  CREATININE 1.02 0.95  CALCIUM 8.7 7.9*  GLUCOSE 137* 167*    CBG (last 3)   Recent Labs  08/25/14 1629 08/25/14 2058 08/26/14 0732  GLUCAP 275* 196* 146*    Scheduled Meds: . Chlorhexidine Gluconate Cloth  6 each Topical Q0600  . enoxaparin (LOVENOX) injection  30 mg Subcutaneous Q24H  . [START ON 08/27/2014] Influenza vac split quadrivalent PF  0.5 mL Intramuscular Tomorrow-1000  . insulin aspart  0-15 Units Subcutaneous TID WC  . insulin aspart  0-5 Units Subcutaneous QHS  . insulin detemir  5 Units Subcutaneous BID  . ipratropium-albuterol  3 mL Nebulization Q4H  . [START ON 08/27/2014] levofloxacin (LEVAQUIN) IV  750 mg Intravenous Q48H  . mupirocin ointment  1 application Nasal BID  . nicotine  21 mg Transdermal Daily  . [START ON 08/27/2014] pneumococcal 23 valent vaccine  0.5 mL Intramuscular Tomorrow-1000  . predniSONE  40 mg Oral BID WC    Continuous Infusions: . sodium chloride 75 mL/hr at 08/26/14 0800    Past Medical History  Diagnosis Date  . Tobacco use   . Murmur   . Cough   . 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 Tricuspid regurgitation 08/26/2014    Mild-mod; Mild Pulm HTN  48 mmHg  . CAP (community acquired pneumonia) 08/25/2014  . Mild mitral valve prolapse 08/26/2014    History reviewed. No pertinent past surgical history.  Burdette Gergely A. Mayford Knife, RD, LDN Pager: [phone]

## 2014-08-26 NOTE — Progress Notes (Signed)
[facility] PROGRESS NOTE  Vincent Robertson:[id number] DOB: February 01, 1951 DOA: 08/25/2014 PCP: No PCP Per Patient    Code Status: Full code Family Communication: Discussed with [name] on 9/17. Disposition Plan: Plan to discharge to home in the next 24-48 hours.   Consultants:  None  Procedures: 2-D echocardiogram 08/25/14:Study Conclusions - Left ventricle: Systolic function was vigorous. The estimated ejection fraction was in the range of 65% to 70%. Wall motion was normal; there were no regional wall motion abnormalities. Doppler parameters are consistent with abnormal left ventricular relaxation (grade 1 diastolic dysfunction). Doppler parameters are consistent with both elevated ventricular end-diastolic filling pressure and elevated left atrial filling pressure. Mild posterior wall thickening. - Aortic valve: Trileaflet; mildly thickened leaflets. - Mitral valve: Moderately thickened leaflets. 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 Late systolic anterior leaflet motion. There was mild regurgitation. Valve area by pressure half-time: 1.8 cm^2. Valve area by continuity equation (using LVOT flow): 1.88 cm^2. - Left atrium: The atrium was moderately dilated. - Tricuspid valve: Mild to moderately thickened leaflets. There was mild-moderate regurgitation. - Pulmonary arteries: PA peak pressure: 48 mm Hg (S). Mild to moderately elevated pulmonary pressures.    Antibiotics:   Levaquin 08/25/14>>  HPI/Subjective: The patient is sitting up in bed eating breakfast. He says that he is breathing much better. No complaints of chest pain or shortness of breath at rest.  Objective: Filed Vitals:   08/26/14 0800  BP: 125/63  Pulse: 75  Temp: 98.1 F (36.7 C)  Resp: 19   oxygen saturation 100% on nasal cannula oxygen.  Intake/Output Summary (Last 24 hours) at 08/26/14 0927 Last data filed at 08/26/14 0800  Gross per 24 hour   Intake   4105 ml  Output      0 ml  Net   4105 ml   Filed Weights   08/25/14 0758 08/25/14 1227 08/26/14 0500  Weight: 45.36 kg (100 lb) 42 kg (92 lb 9.5 oz) 45.7 kg (100 lb 12 oz)    Exam:   General:  Small framed 63 year old Caucasian man sitting up in bed, in no acute distress.  Cardiovascular: S1, S2, with a 2-3/6 systolic murmur.  Respiratory: Fine scattered crackles and wheezes, significantly decreased from yesterday. Breathing nonlabored at rest.  Abdomen: Positive bowel sounds, soft, nontender, nondistended.  Musculoskeletal: No acute hot joints. Pedal pulses palpable. No pedal edema.   Data Reviewed: Basic Metabolic Panel:  Recent Labs Lab 08/25/14 0838 08/26/14 0437  NA 136* 138  K 3.9 3.9  CL 100 105  CO2 23 21  GLUCOSE 137* 167*  BUN 25* 21  CREATININE 1.02 0.95  CALCIUM 8.7 7.9*   Liver Function Tests:  Recent Labs Lab 08/25/14 0838 08/26/14 0437  AST 29 18  ALT 27 20  ALKPHOS 107 96  BILITOT 0.4 0.2*  PROT 7.8 6.3  ALBUMIN 3.5 2.8*   No results found for this basename: LIPASE, AMYLASE,  in the last 168 hours No results found for this basename: AMMONIA,  in the last 168 hours CBC:  Recent Labs Lab 08/25/14 0838 08/26/14 0437  WBC 17.6* 19.1*  NEUTROABS 15.1*  --   HGB 12.2* 10.4*  HCT 35.7* 31.0*  MCV 94.7 95.1  PLT 351 317   Cardiac Enzymes:  Recent Labs Lab 08/25/14 0838 08/25/14 1532  TROPONINI <0.30 <0.30   BNP (last 3 results)  Recent Labs  08/25/14 0838  PROBNP 656.8*   CBG:  Recent Labs Lab 08/25/14 1629 08/25/14 2058  08/26/14 0732  GLUCAP 275* 196* 146*    Recent Results (from the past 240 hour(s))  MRSA PCR SCREENING     Status: Abnormal   Collection Time    08/25/14  1:20 PM      Result Value Ref Range Status   MRSA by PCR POSITIVE (*) NEGATIVE Final   Comment:            The GeneXpert MRSA Assay (FDA     approved for NASAL specimens     only), is one component of a     comprehensive MRSA  colonization     surveillance program. It is not     intended to diagnose MRSA     infection nor to guide or     monitor treatment for     MRSA infections.     RESULT CALLED TO, READ BACK BY AND VERIFIED WITH:     PHILLIPS,C. AT 1526 ON 08/25/2014 BY BAUGHAM,M.     Studies: Dg Chest 2 View  08/25/2014   CLINICAL DATA:  Cough, congestion  EXAM: CHEST  2 VIEW  COMPARISON:  None.  FINDINGS: Cardiomediastinal silhouette is unremarkable. Hyperinflation is noted. Mild perihilar increased bronchial markings. There is patchy infiltrate/pneumonia in left upper lobe. Follow-up to resolution is recommended.  IMPRESSION: Hyperinflation. Patchy infiltrate/ pneumonia in left upper lobe. Follow-up to resolution is recommended.   Electronically Signed   By: Natasha Mead M.D.   On: 08/25/2014 09:28    Scheduled Meds: . Chlorhexidine Gluconate Cloth  6 each Topical Q0600  . enoxaparin (LOVENOX) injection  30 mg Subcutaneous Q24H  . [START ON 08/27/2014] Influenza vac split quadrivalent PF  0.5 mL Intramuscular Tomorrow-1000  . insulin aspart  0-15 Units Subcutaneous TID WC  . insulin aspart  0-5 Units Subcutaneous QHS  . insulin detemir  5 Units Subcutaneous BID  . ipratropium-albuterol  3 mL Nebulization Q4H  . [START ON 08/27/2014] levofloxacin (LEVAQUIN) IV  750 mg Intravenous Q48H  . mupirocin ointment  1 application Nasal BID  . nicotine  21 mg Transdermal Daily  . [START ON 08/27/2014] pneumococcal 23 valent vaccine  0.5 mL Intramuscular Tomorrow-1000  . predniSONE  40 mg Oral BID WC   Continuous Infusions: . sodium chloride 75 mL/hr at 08/26/14 0800   Assessment and plan:  Principal Problem:   Sepsis Active Problems:   Mild diastolic dysfunction   Tricuspid regurgitation   Mild mitral valve prolapse   CAP (community acquired pneumonia)   Acute respiratory failure with hypoxia   COPD exacerbation   Systolic murmur   Fever, unspecified   Leukocytosis, unspecified   Hyponatremia    Dehydration   Hyperglycemia   Tobacco use disorder   Anemia, unspecified    1. Acute respiratory failure with hypoxia, secondary to CAP and COPD exacerbation. The patient is improving clinically and symptomatically. He is currently afebrile. He has fewer bronchospasms. His oxygen saturation has improved. His white blood cell count has increased, likely from the steroid therapy. We will continue IV Levaquin, supportive treatment, oxygen as needed, and prednisone. Will titrate down prednisone slightly today. Strep pneumo antigen was negative. Legionella antigen is still pending.  Sepsis secondary to #1. Clinically resolving. Blood cultures apparently not ordered in the ED. His blood pressure, heart rate, and oxygenation have improved. Lactic acid was within normal limits.  Tobacco abuse. The patient was strongly advised to stop smoking. Tobacco cessation counseling was ordered. We'll continue nicotine replacement therapy.  Significant heart murmur, appears to be  systolic, query diastolic heart murmur as well.. The patient has mild tricuspid valve regurgitation, mild pulmonary hypertension, and mild mitral mitral valve prolapse, per 2-D echocardiogram.. He has diastolic dysfunction, grade 1 as well. No signs of decompensated heart failure. He is asymptomatic.  Hyponatremia, secondary to hypovolemia/dehydration. Resolving with IV fluids. We'll titrate down the IV fluids.  Hyperglycemia. Likely steroid-induced and stress induced. His hemoglobin A1c was within normal limits at 5.6.  Anemia. Some dilutional effects from the IV fluids. Will order an anemia panel for further evaluation.  Underweight. The patient has been relatively small framed all of his life as reported by [name]. His TSH is within normal limits.   Time spent: 35 minutes    [facility]  [facility] Pager [phone]. If 7PM-7AM, please contact night-coverage at [email or web address], password [email or web address] 08/26/2014,  9:27 AM  LOS: 1 day

## 2014-08-26 NOTE — Progress Notes (Signed)
Pt transferred to room 324. Report given to Darvin Neighbours RN. Vital signs stable at transfer.

## 2014-08-26 NOTE — Progress Notes (Signed)
ANTIBIOTIC CONSULT NOTE  Pharmacy Consult for Levaquin Indication: pneumonia  No Known Allergies  Patient Measurements: Height:  (167.6 cm) Weight: 100 lb 12 oz (45.7 kg) IBW/kg (Calculated) : 63.8  Vital Signs: Temp: 98.1 F (36.7 C) (09/18 0800) Temp src: Oral (09/18 0800) BP: 125/63 mmHg (09/18 0800) Pulse Rate: 75 (09/18 0800) Intake/Output from previous day: 09/17 0701 - 09/18 0700 In: 3550 [P.O.:1200; I.V.:2350] Out: -  Intake/Output from this shift: Total I/O In: 555 [P.O.:480; I.V.:75] Out: -   Labs:  Recent Labs  08/25/14 0838 08/26/14 0437  WBC 17.6* 19.1*  HGB 12.2* 10.4*  PLT 351 317  CREATININE 1.02 0.95   Estimated Creatinine Clearance: 52.1 ml/min (by C-G formula based on Cr of 0.95). No results found for this basename: VANCOTROUGH, Leodis Binet, VANCORANDOM, GENTTROUGH, GENTPEAK, GENTRANDOM, TOBRATROUGH, TOBRAPEAK, TOBRARND, AMIKACINPEAK, AMIKACINTROU, AMIKACIN,  in the last 72 hours   Microbiology: Recent Results (from the past 720 hour(s))  MRSA PCR SCREENING     Status: Abnormal   Collection Time    08/25/14  1:20 PM      Result Value Ref Range Status   MRSA by PCR POSITIVE (*) NEGATIVE Final   Comment:            The GeneXpert MRSA Assay (FDA     approved for NASAL specimens     only), is one component of a     comprehensive MRSA colonization     surveillance program. It is not     intended to diagnose MRSA     infection nor to guide or     monitor treatment for     MRSA infections.     RESULT CALLED TO, READ BACK BY AND VERIFIED WITH:     PHILLIPS,C. AT 1526 ON 08/25/2014 BY BAUGHAM,M.    Anti-infectives   Start     Dose/Rate Route Frequency Ordered Stop   08/27/14 0800  levofloxacin (LEVAQUIN) IVPB 750 mg     750 mg 100 mL/hr over 90 Minutes Intravenous Every 48 hours 08/25/14 1305     08/25/14 1000  levofloxacin (LEVAQUIN) IVPB 750 mg     750 mg 100 mL/hr over 90 Minutes Intravenous  Once 08/25/14 0952 08/25/14 1142      Assessment: 62yo underweight F with sepsis likely related to community-acquired pneumonia in the setting of probable COPD exacerbation and a heavy smoker. Slight improvement in renal function since admission.  Estimated CrCl ~ 50-44ml/min.   Levaquin 9/17>>  Goal of Therapy:  Eradicate infection.   Plan:  Increase Levaquin  IV every 24 hours Monitor renal function and cx data  Duration of therapy per MD.   Change to PO once clinically appropriate.   Vincent Robertson 08/26/2014,11:08 AM

## 2014-08-27 ENCOUNTER — Encounter (HOSPITAL_COMMUNITY): Payer: Self-pay | Admitting: Internal Medicine

## 2014-08-27 DIAGNOSIS — E43 Unspecified severe protein-calorie malnutrition: Secondary | ICD-10-CM | POA: Diagnosis present

## 2014-08-27 LAB — GLUCOSE, CAPILLARY
Glucose-Capillary: 196 mg/dL — ABNORMAL HIGH (ref 70–99)
Glucose-Capillary: 116 mg/dL — ABNORMAL HIGH (ref 70–99)
Glucose-Capillary: 117 mg/dL — ABNORMAL HIGH (ref 70–99)

## 2014-08-27 LAB — CBC
HCT: 30.2 % — ABNORMAL LOW (ref 39.0–52.0)
Hemoglobin: 10 g/dL — ABNORMAL LOW (ref 13.0–17.0)
MCH: 31.9 pg (ref 26.0–34.0)
MCHC: 33.1 g/dL (ref 30.0–36.0)
MCV: 96.5 fL (ref 78.0–100.0)
PLATELETS: 346 10*3/uL (ref 150–400)
RBC: 3.13 MIL/uL — ABNORMAL LOW (ref 4.22–5.81)
RDW: 14.2 % (ref 11.5–15.5)
WBC: 18.7 10*3/uL — AB (ref 4.0–10.5)

## 2014-08-27 MED ORDER — PREDNISONE 10 MG PO TABS: ORAL_TABLET | ORAL | Status: DC

## 2014-08-27 MED ORDER — IPRATROPIUM-ALBUTEROL 0.5-2.5 (3) MG/3ML IN SOLN
3.0000 mL | RESPIRATORY_TRACT | Status: DC
  Administered 2014-08-27 (×2): 3 mL via RESPIRATORY_TRACT
  Filled 2014-08-27 (×3): qty 3

## 2014-08-27 MED ORDER — ALBUTEROL SULFATE HFA 108 (90 BASE) MCG/ACT IN AERS: 2.0000 | INHALATION_SPRAY | Freq: Three times a day (TID) | RESPIRATORY_TRACT | Status: AC

## 2014-08-27 MED ORDER — FERROUS SULFATE 325 (65 FE) MG PO TABS
325.0000 mg | ORAL_TABLET | Freq: Two times a day (BID) | ORAL | Status: DC
  Administered 2014-08-27: 325 mg via ORAL
  Filled 2014-08-27: qty 1

## 2014-08-27 MED ORDER — OXYMETAZOLINE HCL 0.05 % NA SOLN: 1.0000 | Freq: Two times a day (BID) | NASAL | Status: DC | PRN

## 2014-08-27 MED ORDER — FERROUS SULFATE 325 (65 FE) MG PO TABS: 325.0000 mg | ORAL_TABLET | Freq: Two times a day (BID) | ORAL | Status: AC

## 2014-08-27 MED ORDER — BENZONATATE 100 MG PO CAPS: 100.0000 mg | ORAL_CAPSULE | Freq: Three times a day (TID) | ORAL | Status: DC | PRN

## 2014-08-27 MED ORDER — LEVOFLOXACIN 750 MG PO TABS
750.0000 mg | ORAL_TABLET | Freq: Every day | ORAL | Status: DC
Start: 2014-08-27 — End: 2014-09-29

## 2014-08-27 NOTE — Progress Notes (Signed)
Vincent Robertson discharged home per MD order.  Discharge instructions reviewed and discussed with the patient, all questions and concerns answered. Copy of instructions and scripts given to patient. Reviewed and gave patient handouts on [facility] program. Patient had no questions.    Medication List    STOP taking these medications       OVER THE COUNTER MEDICATION      TAKE these medications       albuterol 108 (90 BASE) MCG/ACT inhaler  Commonly known as:  PROVENTIL HFA;VENTOLIN HFA  Inhale 2 puffs into the lungs 3 (three) times daily.     benzonatate 100 MG capsule  Commonly known as:  TESSALON  Take 1 capsule (100 mg total) by mouth 3 (three) times daily as needed for cough.     ferrous sulfate 325 (65 FE) MG tablet  Take 1 tablet (325 mg total) by mouth 2 (two) times daily with a meal.     levofloxacin 750 MG tablet  Commonly known as:  LEVAQUIN  Take 1 tablet (750 mg total) by mouth daily. Antibiotic to be taken for 5 more days.     oxymetazoline 0.05 % nasal spray  Commonly known as:  AFRIN  Place 1 spray into both nostrils 2 (two) times daily as needed for congestion.     predniSONE 10 MG tablet  Commonly known as:  DELTASONE  Starting tomorrow, take 5 tablets daily for one day; then 4 tablets the next day; then 3 tablets the next day; then 2 tablets the next day; then 1 tablet the next day; then stop.        Patients skin is clean, dry and intact, no evidence of skin break down. IV site discontinued and catheter remains intact. Site without signs and symptoms of complications. Dressing and pressure applied.  Patient escorted to car by Ladona Ridgel RN in a wheelchair,  no distress noted upon discharge.  Ubaldo Glassing 08/27/2014 3:54 PM

## 2014-08-27 NOTE — Discharge Summary (Signed)
Physician Discharge Summary  Vincent Robertson ZOX:[id number] DOB: 1951-07-12 DOA: 08/25/2014  PCP: No PCP Per Patient  Admit date: 08/25/2014 Discharge date: 08/27/2014  Time spent: Greater than 30 minutes  Recommendations for Outpatient Follow-up:  1. The patient was referred to the refill free clinic or the [facility] for primary care followup.  2. He will need his hemoglobin/hematocrit reassessed in the near future. Consider outpatient GI evaluation in the near future.   Discharge Diagnoses:  1. Sepsis secondary to pneumonia and COPD exacerbation. 2. Community-acquired pneumonia. 3. COPD with exacerbation. 4. Acute respiratory failure with hypoxia. Resolved with medical treatment. 5. Tobacco abuse. The patient was advised to stop smoking. 6. 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 7. Iron deficiency anemia. Total iron was 11. Hemoglobin at the time of discharge was 10.0. 8. Hyponatremia secondary to dehydration. Resolved. 9. Severe malnutrition. 10. Steroid-induced hyperglycemia.  Discharge Condition: Improved.  Diet recommendation: Regular as tolerated.  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 He presented to the emergency department on 08/25/2014 with a chief complaint of progressive shortness of breath. In the ED, he was febrile with a temperature of 102.7. His oxygen saturations were on the low 90s to upper 80s on supplemental oxygen. His heart rate was 117. His blood pressure was within normal limits. His serum sodium was 136. His glucose was 137. His WBC was 17.6.  His hemoglobin was 12.2. His lactic acid was within normal limits. His troponin I was negative. His chest x-ray revealed patchy infiltrate/pneumonia in the left upper lobe. He was admitted for further evaluation and management.  Hospital Course:   1. Acute respiratory failure with hypoxia, secondary to CAP and COPD exacerbation.  The patient was admitted to the step down unit for further evaluation and management. He was started on IV antibiotic therapy with Levaquin. Solu-Medrol was given for acute bronchospasms from COPD exacerbation. Oxygen was applied and titrated to keep his oxygen saturations greater than 90%. Bronchodilator therapy was given with albuterol and Atrovent nebulizers. He improved clinically and symptomatically. His fever resolved. Strep pneumo antigen was negative. Legionella antigen was negative. His white blood cell count did increase, but this was thought to be secondary to steroid therapy. His oxygen saturations were in the upper 90s on room air. He was discharged on a prednisone taper, Levaquin, and albuterol inhaler. He was strongly advised to stop smoking.  Sepsis secondary to #1.  Blood cultures apparently not ordered in the ED. They were not ordered following admission because he had already gotten IV Levaquin. With treatment as above in #1, sepsis resolved. His blood pressure, heart rate, and oxygenation had improved. Lactic acid was within normal limits.  Tobacco abuse.  The patient was strongly advised to stop smoking. Tobacco cessation counseling was ordered. Nicotine replacement therapy was given.  Heart murmur, secondary to...  The patient was noted to have a significant heart murmur. 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 The 2-D echocardiogram was ordered and it revealed mild tricuspid valve regurgitation, mild pulmonary hypertension, and mild mitral mitral valve prolapse,  and diastolic dysfunction, grade 1. No signs of decompensated heart failure.  He was asymptomatic.  Hyponatremia, secondary to hypovolemia/dehydration. Resolved with IV fluids. Hyperglycemia.  Likely steroid-induced and stress induced. He was started on sliding scale NovoLog. I would expect his blood glucose to normalize following the completion of steroid therapy. His hemoglobin A1c was within normal limits at 5.6.  Anemia.  His hemoglobin was 12.21 admission and fell to 10.0. This was thought to be from the dilutional effects of IV fluids. An anemia panel was ordered and revealed a total iron of 11, TIBC of 257, ferritin of 82, and vitamin B12 of 971. He was started on ferrous sulfate supplementation. He will likely need a screening colonoscopy in the near future if he would agree to it. His TSH was within normal limits.   Procedures: 2-D echocardiogram 08/25/14:Study Conclusions - Left ventricle: Systolic function was vigorous. The estimated ejection fraction was in the range of 65% to 70%. Wall motion was normal; there were no regional wall motion abnormalities. Doppler parameters are consistent with abnormal left ventricular relaxation (grade 1 diastolic dysfunction). Doppler parameters are consistent with both elevated ventricular end-diastolic filling pressure and elevated left atrial filling pressure. Mild posterior wall thickening. - Aortic valve: Trileaflet; mildly thickened leaflets. - Mitral valve: Moderately thickened leaflets. 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 Late systolic anterior leaflet motion. There was mild regurgitation. Valve area by pressure half-time: 1.8 cm^2. Valve area by continuity equation (using LVOT flow): 1.88 cm^2. - Left atrium: The atrium was moderately dilated. - Tricuspid valve: Mild to moderately thickened leaflets. There was mild-moderate regurgitation. - Pulmonary arteries: PA peak pressure: 48 mm Hg (S). Mild to moderately elevated pulmonary pressures.   Consultations:  None  Discharge  Exam: Filed Vitals:   08/27/14 0514  BP: 113/65  Pulse: 73  Temp: 97.9 F (36.6 C)  Resp: 20    General: Small framed 63 year old Caucasian man sitting up in bed, in no acute distress. Cardiovascular: S1, S2, with a 2-3/6 systolic murmur. Respiratory: Breathing is nonlabored. Lungs are mostly clear with coarse breath sounds in the bases. Breathing nonlabored at rest. Abdomen: Positive bowel sounds, soft, nontender, nondistended. Musculoskeletal: No acute hot joints. Pedal pulses palpable. No pedal edema.    Discharge Instructions You were cared for by a hospitalist during your hospital stay. If you have any questions about your discharge medications or the care you received while you were in the hospital after you are discharged, you can call the unit and asked to speak with the hospitalist on call if the hospitalist that took care of you is not available. Once you are discharged, your primary care physician will handle any further medical issues. Please note that NO REFILLS for any discharge medications will be authorized once you are discharged, as it is imperative that you return to your primary care physician (or establish a relationship with a primary care physician if you do not have one) for your aftercare needs so that they can reassess your need for medications and monitor your lab values.  Discharge Instructions   Diet general    Complete by:  As directed      Discharge instructions    Complete by:  As directed   Take medications as prescribed. Try to stop smoking. Consider making an appointment with the health department or the [facility] for primary care.     Increase activity slowly    Complete by:  As directed  Current Discharge Medication List    START taking these medications   Details  albuterol (PROVENTIL HFA;VENTOLIN HFA) 108 (90 BASE) MCG/ACT inhaler Inhale 2 puffs into the lungs 3 (three) times daily. Qty: 1 Inhaler, Refills: 2     benzonatate (TESSALON) 100 MG capsule Take 1 capsule (100 mg total) by mouth 3 (three) times daily as needed for cough. Qty: 20 capsule, Refills: 0    ferrous sulfate 325 (65 FE) MG tablet Take 1 tablet (325 mg total) by mouth 2 (two) times daily with a meal.    levofloxacin (LEVAQUIN) 750 MG tablet Take 1 tablet (750 mg total) by mouth daily. Antibiotic to be taken for 5 more days. Qty: 5 tablet, Refills: 0    oxymetazoline (AFRIN) 0.05 % nasal spray Place 1 spray into both nostrils 2 (two) times daily as needed for congestion.    predniSONE (DELTASONE) 10 MG tablet Starting tomorrow, take 5 tablets daily for one day; then 4 tablets the next day; then 3 tablets the next day; then 2 tablets the next day; then 1 tablet the next day; then stop. Qty: 15 tablet, Refills: 0      STOP taking these medications     OVER THE COUNTER MEDICATION        No Known Allergies    The results of significant diagnostics from this hospitalization (including imaging, microbiology, ancillary and laboratory) are listed below for reference.    Significant Diagnostic Studies: Dg Chest 2 View  08/25/2014   CLINICAL DATA:  Cough, congestion  EXAM: CHEST  2 VIEW  COMPARISON:  None.  FINDINGS: Cardiomediastinal silhouette is unremarkable. Hyperinflation is noted. Mild perihilar increased bronchial markings. There is patchy infiltrate/pneumonia in left upper lobe. Follow-up to resolution is recommended.  IMPRESSION: Hyperinflation. Patchy infiltrate/ pneumonia in left upper lobe. Follow-up to resolution is recommended.   Electronically Signed   By: Natasha Mead M.D.   On: 08/25/2014 09:28    Microbiology: Recent Results (from the past 240 hour(s))  MRSA PCR SCREENING     Status: Abnormal   Collection Time    08/25/14  1:20 PM      Result Value Ref Range Status   MRSA by PCR POSITIVE (*) NEGATIVE Final   Comment:            The GeneXpert MRSA Assay (FDA     approved for NASAL specimens     only), is one  component of a     comprehensive MRSA colonization     surveillance program. It is not     intended to diagnose MRSA     infection nor to guide or     monitor treatment for     MRSA infections.     RESULT CALLED TO, READ BACK BY AND VERIFIED WITH:     PHILLIPS,C. AT 1526 ON 08/25/2014 BY BAUGHAM,M.     Labs: Basic Metabolic Panel:  Recent Labs Lab 08/25/14 0838 08/26/14 0437  NA 136* 138  K 3.9 3.9  CL 100 105  CO2 23 21  GLUCOSE 137* 167*  BUN 25* 21  CREATININE 1.02 0.95  CALCIUM 8.7 7.9*   Liver Function Tests:  Recent Labs Lab 08/25/14 0838 08/26/14 0437  AST 29 18  ALT 27 20  ALKPHOS 107 96  BILITOT 0.4 0.2*  PROT 7.8 6.3  ALBUMIN 3.5 2.8*   No results found for this basename: LIPASE, AMYLASE,  in the last 168 hours No results found for this basename: AMMONIA,  in  the last 168 hours CBC:  Recent Labs Lab 08/25/14 0838 08/26/14 0437 08/27/14 0538  WBC 17.6* 19.1* 18.7*  NEUTROABS 15.1*  --   --   HGB 12.2* 10.4* 10.0*  HCT 35.7* 31.0* 30.2*  MCV 94.7 95.1 96.5  PLT 351 317 346   Cardiac Enzymes:  Recent Labs Lab 08/25/14 0838 08/25/14 1532  TROPONINI <0.30 <0.30   BNP: BNP (last 3 results)  Recent Labs  08/25/14 0838  PROBNP 656.8*   CBG:  Recent Labs Lab 08/26/14 1135 08/26/14 1614 08/26/14 2058 08/27/14 0723 08/27/14 1121  GLUCAP 151* 191* 196* 116* 117*       Signed:  Glennis Montenegro  [facility] 08/27/2014, 12:29 PM

## 2014-08-27 NOTE — Progress Notes (Signed)
CARE MANAGEMENT NOTE 08/27/2014  Patient:  Vincent Robertson   Account Number:  [id number]  Date Initiated:  08/27/2014  Documentation initiated by:  [facility]  Subjective/Objective Assessment:   pneumonia     Action/Plan:   Anticipated DC Date:  08/27/2014   Anticipated DC Plan:  HOME/SELF CARE      DC Planning Services  CM consult  MATCH Program  Medication Assistance      Choice offered to / List presented to:             Status of service:  Completed, signed off Medicare Important Message given?   (If response is "NO", the following Medicare IM given date fields will be blank) Date Medicare IM given:   Medicare IM given by:   Date Additional Medicare IM given:   Additional Medicare IM given by:    Discharge Disposition:  HOME/SELF CARE  Per UR Regulation:    If discussed at Long Length of Stay Meetings, dates discussed:    Comments:  08/27/2014 1245 NCM spoke to pt and explained MATCH and copay of $3.00. Explained can use once per year. Isidoro Donning RN CCM Case Mgmt phone [phone]

## 2014-08-27 NOTE — Plan of Care (Signed)
Problem: Phase II Progression Outcomes Goal: Wean O2 if indicated Outcome: Completed/Met Date Met:  08/27/14 Pt on Room Air

## 2014-09-28 ENCOUNTER — Ambulatory Visit (INDEPENDENT_AMBULATORY_CARE_PROVIDER_SITE_OTHER): Payer: Self-pay | Admitting: Psychology

## 2014-09-28 ENCOUNTER — Encounter (HOSPITAL_COMMUNITY): Payer: Self-pay | Admitting: Psychology

## 2014-09-28 DIAGNOSIS — F4322 Adjustment disorder with anxiety: Secondary | ICD-10-CM

## 2014-09-28 NOTE — Progress Notes (Signed)
PROGRESS NOTE  Patient:   Vincent Robertson   DOB:   08/15/1951  MR Number:  [id number]  [facility]  [facility] [address] [place] [address] Dept: [phone]           Date of Service:   09/28/2014  Start Time:   9 AM End Time:   9:55 AM  Provider/Observer:  Hershal CoriaJohn R Kiauna Zywicki PSYD       Billing Code/Service: [phone]  Chief Complaint:     Chief Complaint  Patient presents with  . Stress  . Agitation    Reason for Service:  The patient was self-referred because of difficulties with a new relationship he is been engaged in. The patient reports that when he was a teenager and very [place] he got married and they were married for 5 years. There relationships last marriage ended due to his infidelity but he always continued to have very strong feelings for his first wife. Recently, they have gotten back together after 30 years apart and mild the relationship is going very well and are talking about getting married again he has been engaging in behaviors that have pushed her away at times. He is trying to find out how we can stop these types of activities. The patient reports that he loves this woman but that he "keeps messing and." The patient reports that whenever they get very close he tends to chaser way. However, recently he was put on medications including prednisone and reports that he became much more agitated during his medication and these behaviors exacerbated with his wife/girlfriend the patient reports that he is likely still harboring deal from the marriage the first time. Narrowing was her infidelity but they also ended up having to abort the child they were going to have because of Rh factor issues. The patient has been married 3 times and his ex-wife/girlfriend has been married 4 times Intermarriage to each other.  Current Status:  The patient reports stress and worry most  anxiety and fear of losing this  Reliability of Information: The information is provided by the patient as well as review of available medical records.  Behavioral Observation: Vincent Robertson  presents as a 63 y.o.-year-old Right Caucasian Male who appeared his stated age. his dress was Appropriate and he was Fairly Groomed and his manners were Appropriate to the situation.  There were  physical disabilities noted.  he displayed an appropriate level of cooperation and motivation.    Interactions:    Active   Attention:   within normal limits  Memory:   within normal limits  Visuo-spatial:   within normal limits  Speech (Volume):  normal  Speech:   normal pitch  Thought Process:  Coherent  Though Content:  WNL  Orientation:   person, place, time/date and situation  Judgment:   Good  Planning:   Good  Affect:    Anxious  Mood:    Anxious  Insight:   Present  Intelligence:   normal  Marital Status/Living: The patient has been married 3 times and is back to her relationship with his first wife. This is the primary issue related to his difficulties at this point. He is worried that he will do think that we'll break this relationship again like it was in the past.  Current Employment: The patient is not working now and is the same. He worked in [employer] company prior.  Past Employment:  Substance Use:  No concerns of substance abuse are reported.    Education:   Graduate  Medical History:   Past Medical History  Diagnosis Date  . Tobacco use   . Murmur   . Cough   . 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 Tricuspid regurgitation 08/26/2014    Mild-mod; Mild Pulm HTN  48 mmHg  . CAP (community acquired pneumonia) 08/25/2014    With mild sepsis.  . Mild mitral valve prolapse 08/26/2014        Outpatient Encounter Prescriptions as of 09/28/2014  Medication Sig  . albuterol (PROVENTIL HFA;VENTOLIN HFA) 108 (90 BASE) MCG/ACT inhaler Inhale 2 puffs into the lungs 3  (three) times daily.  . benzonatate (TESSALON) 100 MG capsule Take 1 capsule (100 mg total) by mouth 3 (three) times daily as needed for cough.  . ferrous sulfate 325 (65 FE) MG tablet Take 1 tablet (325 mg total) by mouth 2 (two) times daily with a meal.  . levofloxacin (LEVAQUIN) 750 MG tablet Take 1 tablet (750 mg total) by mouth daily. Antibiotic to be taken for 5 more days.  Marland Kitchen. oxymetazoline (AFRIN) 0.05 % nasal spray Place 1 spray into both nostrils 2 (two) times daily as needed for congestion.  . predniSONE (DELTASONE) 10 MG tablet Starting tomorrow, take 5 tablets daily for one day; then 4 tablets the next day; then 3 tablets the next day; then 2 tablets the next day; then 1 tablet the next day; then stop.          Sexual History:   History  Sexual Activity  . Sexual Activity: Not on file    Abuse/Trauma History: The patient denies any history of abusive trauma.  Psychiatric History:  The patient denies any prior psychiatric history.  Family Med/Psych History: No family history on file.  Risk of Suicide/Violence: virtually non-existent the patient denies any suicidal or homicidal ideation.  Impression/DX:  The patient is having some adjustment difficulties and worried about maintaining a relationship. He had been married to this woman 30 years ago and the marriage ended when he was disloyal to her and had a fair. The patient reports that he continued to have and maintain feelings for her and then reconnected Begun a relationship again. However, he is very anxious and worried that he will do things in this relationship.  Disposition/Plan:  We'll set the patient for individual psychotherapeutic interventions.  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

## 2014-09-29 ENCOUNTER — Encounter (HOSPITAL_COMMUNITY): Payer: Self-pay | Admitting: Emergency Medicine

## 2014-09-29 ENCOUNTER — Emergency Department (HOSPITAL_COMMUNITY): Payer: Medicaid Other

## 2014-09-29 ENCOUNTER — Inpatient Hospital Stay (HOSPITAL_COMMUNITY)
Admission: EM | Admit: 2014-09-29 | Discharge: 2014-10-01 | DRG: 190 | Disposition: A | Discharge status: Home Health | Payer: Medicaid Other | Attending: Internal Medicine | Admitting: Internal Medicine

## 2014-09-29 DIAGNOSIS — T380X5A Adverse effect of glucocorticoids and synthetic analogues, initial encounter: Secondary | ICD-10-CM | POA: Diagnosis present

## 2014-09-29 DIAGNOSIS — J441 Chronic obstructive pulmonary disease with (acute) exacerbation: Principal | ICD-10-CM | POA: Diagnosis present

## 2014-09-29 DIAGNOSIS — F17210 Nicotine dependence, cigarettes, uncomplicated: Secondary | ICD-10-CM | POA: Diagnosis present

## 2014-09-29 DIAGNOSIS — R0602 Shortness of breath: Secondary | ICD-10-CM | POA: Diagnosis present

## 2014-09-29 DIAGNOSIS — Z720 Tobacco use: Secondary | ICD-10-CM

## 2014-09-29 DIAGNOSIS — J9601 Acute respiratory failure with hypoxia: Secondary | ICD-10-CM | POA: Diagnosis present

## 2014-09-29 DIAGNOSIS — Z791 Long term (current) use of non-steroidal anti-inflammatories (NSAID): Secondary | ICD-10-CM | POA: Diagnosis not present

## 2014-09-29 DIAGNOSIS — E41 Nutritional marasmus: Secondary | ICD-10-CM

## 2014-09-29 DIAGNOSIS — Z79899 Other long term (current) drug therapy: Secondary | ICD-10-CM | POA: Diagnosis not present

## 2014-09-29 DIAGNOSIS — E43 Unspecified severe protein-calorie malnutrition: Secondary | ICD-10-CM | POA: Diagnosis present

## 2014-09-29 DIAGNOSIS — Z681 Body mass index (BMI) 19 or less, adult: Secondary | ICD-10-CM

## 2014-09-29 DIAGNOSIS — F17200 Nicotine dependence, unspecified, uncomplicated: Secondary | ICD-10-CM | POA: Diagnosis present

## 2014-09-29 HISTORY — DX: Chronic obstructive pulmonary disease, unspecified: J44.9

## 2014-09-29 LAB — MRSA PCR SCREENING: MRSA by PCR: NEGATIVE

## 2014-09-29 LAB — PRO B NATRIURETIC PEPTIDE: PRO B NATRI PEPTIDE: 750.3 pg/mL — AB (ref 0–125)

## 2014-09-29 LAB — COMPREHENSIVE METABOLIC PANEL
ALT: 15 U/L (ref 0–53)
AST: 16 U/L (ref 0–37)
Albumin: 3.7 g/dL (ref 3.5–5.2)
Alkaline Phosphatase: 121 U/L — ABNORMAL HIGH (ref 39–117)
Anion gap: 13 (ref 5–15)
BUN: 18 mg/dL (ref 6–23)
CO2: 27 meq/L (ref 19–32)
CREATININE: 0.98 mg/dL (ref 0.50–1.35)
Calcium: 9.4 mg/dL (ref 8.4–10.5)
Chloride: 101 mEq/L (ref 96–112)
GFR calc Af Amer: >90 mL/min (ref >90)
GFR, EST NON AFRICAN AMERICAN: 86 mL/min — AB (ref >90)
Glucose, Bld: 97 mg/dL (ref 70–99)
Potassium: 4.3 mEq/L (ref 3.7–5.3)
Sodium: 141 mEq/L (ref 137–147)
Total Bilirubin: 0.4 mg/dL (ref 0.3–1.2)
Total Protein: 7.6 g/dL (ref 6.0–8.3)

## 2014-09-29 LAB — CBC WITH DIFFERENTIAL/PLATELET
BASOS ABS: 0.1 10*3/uL (ref 0.0–0.1)
Basophils Relative: 1 % (ref 0–1)
EOS ABS: 0.1 10*3/uL (ref 0.0–0.7)
EOS PCT: 1 % (ref 0–5)
HEMATOCRIT: 41.7 % (ref 39.0–52.0)
Hemoglobin: 13.8 g/dL (ref 13.0–17.0)
LYMPHS PCT: 11 % — AB (ref 12–46)
Lymphs Abs: 1.8 10*3/uL (ref 0.7–4.0)
MCH: 32 pg (ref 26.0–34.0)
MCHC: 33.1 g/dL (ref 30.0–36.0)
MCV: 96.8 fL (ref 78.0–100.0)
MONO ABS: 1.3 10*3/uL — AB (ref 0.1–1.0)
Monocytes Relative: 8 % (ref 3–12)
Neutro Abs: 12.7 10*3/uL — ABNORMAL HIGH (ref 1.7–7.7)
Neutrophils Relative %: 79 % — ABNORMAL HIGH (ref 43–77)
Platelets: 371 10*3/uL (ref 150–400)
RBC: 4.31 MIL/uL (ref 4.22–5.81)
RDW: 14 % (ref 11.5–15.5)
WBC: 16.1 10*3/uL — ABNORMAL HIGH (ref 4.0–10.5)

## 2014-09-29 LAB — TROPONIN I

## 2014-09-29 LAB — D-DIMER, QUANTITATIVE: D-Dimer, Quant: 0.4 ug/mL-FEU (ref 0.00–0.48)

## 2014-09-29 MED ORDER — ONDANSETRON HCL 4 MG/2ML IJ SOLN: 4.0000 mg | Freq: Four times a day (QID) | INTRAMUSCULAR | Status: DC | PRN

## 2014-09-29 MED ORDER — SODIUM CHLORIDE 0.9 % IV SOLN: 250.0000 mL | INTRAVENOUS | Status: DC | PRN

## 2014-09-29 MED ORDER — SODIUM CHLORIDE 0.9 % IJ SOLN: 3.0000 mL | INTRAMUSCULAR | Status: DC | PRN

## 2014-09-29 MED ORDER — GUAIFENESIN ER 600 MG PO TB12
600.0000 mg | ORAL_TABLET | Freq: Two times a day (BID) | ORAL | Status: DC
  Administered 2014-09-29 – 2014-10-01 (×5): 600 mg via ORAL
  Filled 2014-09-29 (×5): qty 1

## 2014-09-29 MED ORDER — OXYCODONE HCL 5 MG PO TABS
5.0000 mg | ORAL_TABLET | Freq: Once | ORAL | Status: AC
  Administered 2014-09-29: 5 mg via ORAL
  Filled 2014-09-29: qty 1

## 2014-09-29 MED ORDER — ENOXAPARIN SODIUM 40 MG/0.4ML ~~LOC~~ SOLN
40.0000 mg | SUBCUTANEOUS | Status: DC
  Administered 2014-09-29: 40 mg via SUBCUTANEOUS
  Filled 2014-09-29 (×2): qty 0.4

## 2014-09-29 MED ORDER — AMOXICILLIN-POT CLAVULANATE 875-125 MG PO TABS
1.0000 | ORAL_TABLET | Freq: Two times a day (BID) | ORAL | Status: DC
  Administered 2014-09-29 – 2014-10-01 (×5): 1 via ORAL
  Filled 2014-09-29 (×5): qty 1

## 2014-09-29 MED ORDER — METHYLPREDNISOLONE SODIUM SUCC 125 MG IJ SOLR
125.0000 mg | Freq: Once | INTRAMUSCULAR | Status: AC
  Administered 2014-09-29: 125 mg via INTRAVENOUS
  Filled 2014-09-29: qty 2

## 2014-09-29 MED ORDER — MOMETASONE FURO-FORMOTEROL FUM 100-5 MCG/ACT IN AERO
2.0000 | INHALATION_SPRAY | Freq: Two times a day (BID) | RESPIRATORY_TRACT | Status: DC
  Administered 2014-09-29 – 2014-10-01 (×5): 2 via RESPIRATORY_TRACT
  Filled 2014-09-29: qty 8.8

## 2014-09-29 MED ORDER — ACETAMINOPHEN 325 MG PO TABS
650.0000 mg | ORAL_TABLET | Freq: Four times a day (QID) | ORAL | Status: DC | PRN
  Administered 2014-09-30: 650 mg via ORAL
  Filled 2014-09-29: qty 2

## 2014-09-29 MED ORDER — IPRATROPIUM-ALBUTEROL 0.5-2.5 (3) MG/3ML IN SOLN
3.0000 mL | Freq: Once | RESPIRATORY_TRACT | Status: AC
  Administered 2014-09-29: 3 mL via RESPIRATORY_TRACT
  Filled 2014-09-29: qty 3

## 2014-09-29 MED ORDER — SODIUM CHLORIDE 0.9 % IV SOLN
INTRAVENOUS | Status: AC
  Administered 2014-09-29: 10:00:00 via INTRAVENOUS

## 2014-09-29 MED ORDER — FERROUS SULFATE 325 (65 FE) MG PO TABS
325.0000 mg | ORAL_TABLET | Freq: Two times a day (BID) | ORAL | Status: DC
  Administered 2014-09-29 – 2014-10-01 (×4): 325 mg via ORAL
  Filled 2014-09-29 (×4): qty 1

## 2014-09-29 MED ORDER — ALBUTEROL SULFATE (2.5 MG/3ML) 0.083% IN NEBU: 2.5000 mg | INHALATION_SOLUTION | RESPIRATORY_TRACT | Status: DC | PRN

## 2014-09-29 MED ORDER — ONDANSETRON HCL 4 MG PO TABS: 4.0000 mg | ORAL_TABLET | Freq: Four times a day (QID) | ORAL | Status: DC | PRN

## 2014-09-29 MED ORDER — ALBUTEROL SULFATE (2.5 MG/3ML) 0.083% IN NEBU
2.5000 mg | INHALATION_SOLUTION | Freq: Once | RESPIRATORY_TRACT | Status: AC
  Administered 2014-09-29: 2.5 mg via RESPIRATORY_TRACT
  Filled 2014-09-29: qty 3

## 2014-09-29 MED ORDER — BOOST HIGH PROTEIN PO LIQD
237.0000 mL | Freq: Three times a day (TID) | ORAL | Status: DC
  Administered 2014-09-29 – 2014-10-01 (×4): 237 mL via ORAL
  Filled 2014-09-29 (×12): qty 237

## 2014-09-29 MED ORDER — ACETAMINOPHEN 650 MG RE SUPP: 650.0000 mg | Freq: Four times a day (QID) | RECTAL | Status: DC | PRN

## 2014-09-29 MED ORDER — NICOTINE 21 MG/24HR TD PT24
21.0000 mg | MEDICATED_PATCH | Freq: Every day | TRANSDERMAL | Status: DC
  Administered 2014-09-29 – 2014-10-01 (×3): 21 mg via TRANSDERMAL
  Filled 2014-09-29 (×3): qty 1

## 2014-09-29 MED ORDER — SODIUM CHLORIDE 0.9 % IJ SOLN
3.0000 mL | Freq: Two times a day (BID) | INTRAMUSCULAR | Status: DC
  Administered 2014-09-29 – 2014-10-01 (×4): 3 mL via INTRAVENOUS

## 2014-09-29 MED ORDER — METHYLPREDNISOLONE SODIUM SUCC 125 MG IJ SOLR
60.0000 mg | Freq: Four times a day (QID) | INTRAMUSCULAR | Status: DC
  Administered 2014-09-29 – 2014-09-30 (×5): 60 mg via INTRAVENOUS
  Filled 2014-09-29 (×5): qty 2

## 2014-09-29 MED ORDER — ALPRAZOLAM 0.25 MG PO TABS
0.2500 mg | ORAL_TABLET | Freq: Three times a day (TID) | ORAL | Status: DC | PRN
  Administered 2014-09-29 – 2014-09-30 (×4): 0.25 mg via ORAL
  Filled 2014-09-29 (×4): qty 1

## 2014-09-29 MED ORDER — IPRATROPIUM-ALBUTEROL 0.5-2.5 (3) MG/3ML IN SOLN
3.0000 mL | RESPIRATORY_TRACT | Status: DC
  Administered 2014-09-29 – 2014-10-01 (×12): 3 mL via RESPIRATORY_TRACT
  Filled 2014-09-29 (×12): qty 3

## 2014-09-29 MED ORDER — LEVOFLOXACIN IN D5W 500 MG/100ML IV SOLN
500.0000 mg | Freq: Once | INTRAVENOUS | Status: AC
  Administered 2014-09-29: 500 mg via INTRAVENOUS
  Filled 2014-09-29: qty 100

## 2014-09-29 NOTE — Care Management Utilization Note (Signed)
UR completed 

## 2014-09-29 NOTE — ED Notes (Signed)
Pt.  Reports shortness of breath starting this morning. Pt. Reports that he is out of inhaler.

## 2014-09-29 NOTE — ED Notes (Signed)
Dr. Rancour at bedside. 

## 2014-09-29 NOTE — Progress Notes (Signed)
Patient complains of SOB, O2 sats 98% on O2 2lpm. Respiratory therapist called, patient just received neb treatment. patient appears anxious. Dr. Kerry HoughMemon notified.

## 2014-09-29 NOTE — ED Notes (Signed)
Pt. Decreased to 2L [place].

## 2014-09-29 NOTE — ED Notes (Signed)
In to ambulate patient with pulse ox. Patient 87-89% on RA and dyspnea at rest. MD notified, in to see patient. Did not ambulate patient. Awaiting admission.

## 2014-09-29 NOTE — Progress Notes (Signed)
INITIAL NUTRITION ASSESSMENT  DOCUMENTATION CODES Per approved criteria  -Severe malnutrition in the context of chronic illness -Underweight   INTERVENTION:  Boost Plus TID (provides 360 kcal, 14 gr protein each)  Recommend pt follow up with outpatient RD for further education and nutrition monitoring   NUTRITION DIAGNOSIS: Inadequate oral intake related to COPD exacerbation and chronic undernutrition (only 1 meal most days) as evidenced by muscle wasting and diet hx .   Goal: Pt to meet >/= 90% of their estimated nutrition needs    Monitor:  Po intake, labs and wt trends   Reason for Assessment: evaluate nutrition requirements/ status  63 y.o. male  Admitting Dx: COPD exacerbation  ASSESSMENT: Pt says he has always been small person but weighed 135-140# years ago. His weight has continued to decrease over the years to a low of 97# which he has maintained the past 4 years. Pt  says he had just stopped taking care of himself. He is smoker and eats only 1 meal daily. Skips breakfast and lunch then has an early dinner.  Reviewed with him the importance of adequate protein and energy daily taken consistentently throughout the day. Encouraged small frequent meals 6 times per day. We discussed high protein nutrient choices to help meet increased nutrient needs.   Nutrition Focused Physical Exam:  Subcutaneous Fat:  Orbital Region: mild depletion  Upper Arm Region: moderate depletion Thoracic and Lumbar Region: moderate depletion  Muscle:  Temple Region: moderate wasting Clavicle Bone Region: mild wasting Clavicle and Acromion Bone Region: mild wasting Scapular Bone Region: moderate wasting Dorsal Hand: moderate wasting Patellar Region: severe wasting Anterior Thigh Region: moderate wasting Posterior Calf Region: mild wasting  Edema: none    Height: Ht Readings from Last 1 Encounters:  09/29/14 5\' 6"  (1.676 m)    Weight: Wt Readings from Last 1 Encounters:  09/29/14  100 lb (45.36 kg)    Ideal Body Weight: 142# (65 kg)  % Ideal Body Weight: 70%   Wt Readings from Last 10 Encounters:  09/29/14 100 lb (45.36 kg)  08/26/14 100 lb 12 oz (45.7 kg)    Usual Body Weight: 97#  % Usual Body Weight: 102%  BMI:  Body mass index is 16.15 kg/(m^2). underweight  Estimated Nutritional Needs: Kcal: 1800 (to promote gradual wt gain) Protein: 80-90 gr Fluid: 1600 ml daily  Skin: intact  Diet Order: General  EDUCATION NEEDS: -No education needs identified at this time   Intake/Output Summary (Last 24 hours) at 09/29/14 1432 Last data filed at 09/29/14 1318  Gross per 24 hour  Intake    480 ml  Output      0 ml  Net    480 ml    Last BM: 10/21  Labs:   Recent Labs Lab 09/29/14 0536  NA 141  K 4.3  CL 101  CO2 27  BUN 18  CREATININE 0.98  CALCIUM 9.4  GLUCOSE 97    CBG (last 3)  No results found for this basename: GLUCAP,  in the last 72 hours  Scheduled Meds: . sodium chloride   Intravenous STAT  . amoxicillin-clavulanate  1 tablet Oral Q12H  . enoxaparin (LOVENOX) injection  40 mg Subcutaneous Q24H  . ferrous sulfate  325 mg Oral BID WC  . guaiFENesin  600 mg Oral BID  . ipratropium-albuterol  3 mL Nebulization Q4H  . methylPREDNISolone (SOLU-MEDROL) injection  60 mg Intravenous Q6H  . mometasone-formoterol  2 puff Inhalation BID  . nicotine  21 mg  Transdermal Daily  . sodium chloride  3 mL Intravenous Q12H    Continuous Infusions:   Past Medical History  Diagnosis Date  . Tobacco use   . Murmur   . Cough   . 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 Tricuspid regurgitation 08/26/2014    Mild-mod; Mild Pulm HTN  48 mmHg  . CAP (community acquired pneumonia) 08/25/2014    With mild sepsis.  . Mild mitral valve prolapse 08/26/2014  . COPD (chronic obstructive pulmonary disease)     History reviewed. No pertinent past surgical history.  Royann ShiversLynn Gwyndolyn Guilford MS,RD,CSG,LDN Office: [phone] Pager: [phone]

## 2014-09-29 NOTE — ED Provider Notes (Signed)
CSN: [id number]     Arrival date & time 09/29/14  0532 History   First MD Initiated Contact with Patient 09/29/14 [phone]     No chief complaint on file.    (Consider location/radiation/quality/duration/timing/severity/associated sxs/prior Treatment) HPI Comments: Patient presents with difficulty breathing the onset this morning and woke him from sleep. States out of his inhaler. He has a history of extensive smoking and COPD. Similar to admission to the hospital in September with pneumonia. Denies any chest pain. Cough is productive of yellow/green mucous. Denies any fever. Denies any leg pain or leg swelling. No history of blood clots. No abdominal pain, nausea vomiting.  The history is provided by the patient and a relative. The history is limited by the condition of the patient.    Past Medical History  Diagnosis Date  . Tobacco use   . Murmur   . Cough   . 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 Tricuspid regurgitation 08/26/2014    Mild-mod; Mild Pulm HTN  48 mmHg  . CAP (community acquired pneumonia) 08/25/2014    With mild sepsis.  . Mild mitral valve prolapse 08/26/2014  . COPD (chronic obstructive pulmonary disease)    History reviewed. No pertinent past surgical history. No family history on file. History  Substance Use Topics  . Smoking status: Current Some Day Smoker -- 0.50 packs/day    Types: Cigarettes  . Smokeless tobacco: Not on file  . Alcohol Use: No    Review of Systems  Constitutional: Positive for fatigue. Negative for fever.  HENT: Positive for congestion and rhinorrhea.   Eyes: Negative for visual disturbance.  Respiratory: Positive for cough and shortness of breath. Negative for chest tightness.   Cardiovascular: Negative for chest pain.  Gastrointestinal: Negative for nausea, vomiting and abdominal pain.  Genitourinary: Negative for dysuria and hematuria.  Musculoskeletal: Negative for arthralgias and myalgias.  Skin: Negative for rash.   Neurological: Negative for dizziness, weakness and headaches.  A complete 10 system review of systems was obtained and all systems are negative except as noted in the HPI and PMH.      Allergies  Review of patient's allergies indicates no known allergies.  Home Medications   Prior to Admission medications   Medication Sig Start Date End Date Taking? Authorizing Provider  albuterol (PROVENTIL HFA;VENTOLIN HFA) 108 (90 BASE) MCG/ACT inhaler Inhale 2 puffs into the lungs 3 (three) times daily. 08/27/14   Elliot Cousin, MD  benzonatate (TESSALON) 100 MG capsule Take 1 capsule (100 mg total) by mouth 3 (three) times daily as needed for cough. 08/27/14   Elliot Cousin, MD  ferrous sulfate 325 (65 FE) MG tablet Take 1 tablet (325 mg total) by mouth 2 (two) times daily with a meal. 08/27/14   Elliot Cousin, MD  levofloxacin (LEVAQUIN) 750 MG tablet Take 1 tablet (750 mg total) by mouth daily. Antibiotic to be taken for 5 more days. 08/27/14   Elliot Cousin, MD  oxymetazoline (AFRIN) 0.05 % nasal spray Place 1 spray into both nostrils 2 (two) times daily as needed for congestion. 08/27/14   Elliot Cousin, MD  predniSONE (DELTASONE) 10 MG tablet Starting tomorrow, take 5 tablets daily for one day; then 4 tablets the next day; then 3 tablets the next day; then 2 tablets the next day; then 1 tablet the next day; then stop. 08/27/14   Elliot Cousin, MD   BP 135/71  Pulse 87  Temp(Src) 98.6 F (37 C) (Oral)  Resp 26  Ht 5\' 6"  (1.676  m)  Wt 100 lb (45.36 kg)  BMI 16.15 kg/m2  SpO2 91% Physical Exam  Nursing note and vitals reviewed. Constitutional: He is oriented to person, place, and time. He appears well-developed and well-nourished. He appears distressed.  Mild respiratory distress with increased work of breathing  HENT:  Head: Normocephalic and atraumatic.  Mouth/Throat: Oropharynx is clear and moist. No oropharyngeal exudate.  Eyes: Conjunctivae and EOM are normal. Pupils are equal, round, and  reactive to light.  Neck: Normal range of motion. Neck supple.  No meningismus.  Cardiovascular: Normal rate, regular rhythm and intact distal pulses.   Murmur heard. Pulmonary/Chest: Effort normal. No respiratory distress. He has wheezes.  Decreased breath sounds throughout with scattered expiratory wheezing.  Abdominal: Soft. There is no tenderness. There is no rebound and no guarding.  Musculoskeletal: Normal range of motion. He exhibits no edema and no tenderness.  Neurological: He is alert and oriented to person, place, and time. No cranial nerve deficit. He exhibits normal muscle tone. Coordination normal.  No ataxia on finger to nose bilaterally. No pronator drift. 5/5 strength throughout. CN 2-12 intact. Negative Romberg. Equal grip strength. Sensation intact. Gait is normal.   Skin: Skin is warm.  Psychiatric: He has a normal mood and affect. His behavior is normal.    ED Course  Procedures (including critical care time) Labs Review Labs Reviewed  CBC WITH DIFFERENTIAL - Abnormal; Notable for the following:    WBC 16.1 (*)    Neutrophils Relative % 79 (*)    Neutro Abs 12.7 (*)    Lymphocytes Relative 11 (*)    Monocytes Absolute 1.3 (*)    All other components within normal limits  COMPREHENSIVE METABOLIC PANEL - Abnormal; Notable for the following:    Alkaline Phosphatase 121 (*)    GFR calc non Af Amer 86 (*)    All other components within normal limits  PRO B NATRIURETIC PEPTIDE - Abnormal; Notable for the following:    Pro B Natriuretic peptide (BNP) 750.3 (*)    All other components within normal limits  TROPONIN I  D-DIMER, QUANTITATIVE  URINALYSIS, ROUTINE W REFLEX MICROSCOPIC    Imaging Review Dg Chest Portable 1 View  09/29/2014   CLINICAL DATA:  Shortness of breath starting this morning.  EXAM: PORTABLE CHEST - 1 VIEW  COMPARISON:  On 08/25/2014  FINDINGS: Normal heart size and pulmonary vascularity. Emphysematous changes and scattered fibrosis in the  lungs. Improving infiltration in the left mid lung since prior study. Mild blunting of costophrenic angles probably represents pleural thickening.  IMPRESSION: Emphysematous changes and fibrosis in the lungs. Improving infiltrate in the left mid lung since prior study.   Electronically Signed   By: Burman NievesWilliam  Stevens M.D.   On: 09/29/2014 06:34     EKG Interpretation   Date/Time:  Thursday September 29 2014 05:39:44 EDT Ventricular Rate:  91 PR Interval:  118 QRS Duration: 89 QT Interval:  363 QTC Calculation: 447 R Axis:   89 Text Interpretation:  Sinus rhythm Ventricular premature complex  Borderline short PR interval Probable left atrial enlargement Borderline  right axis deviation Probable left ventricular hypertrophy Nonspecific T  abnormalities, lateral leads Baseline wander in lead(s) V5 Artifact No  significant change was found Confirmed by Manus GunningANCOUR  MD, Dosha Broshears [phone]([id number]) on  09/29/2014 5:47:35 AM      MDM   Final diagnoses:  Acute respiratory failure with hypoxia  COPD exacerbation    History of COPD with shortness of breath onset this morning.  No chest pain or fever. Cough productive of yellow mucus. No leg pain or leg swelling. Admission for pneumonia about one month ago.   Nebulizers, antibiotics, steroids for presumed COPD exacerbation.  EKG unchanged. D-dimer negative.   Patient with hypoxic respiratory failure and oxygenation dropped to 87% at rest. He does not wear oxygen at home. Breath sounds improved with improved air exchang. He Is improved but will admit for ongoing COPD exacerbation and hypoxia. D/w Dr. Kerry HoughMemon.    Glynn OctaveStephen Torrance Stockley, MD 09/29/14 [phone]

## 2014-09-29 NOTE — ED Notes (Signed)
RT paged.

## 2014-09-29 NOTE — H&P (Signed)
[facility] History and Physical  Vincent AdamLawrence E Kinney JYN:[id number] DOB: 08/25/1951 DOA: 09/29/2014  Referring physician: Dr. Manus Gunningancour, ER physician PCP: No PCP Per Patient   Chief Complaint: shortness of breath  HPI: Vincent Robertson is a 63 y.o. male with history of COPD and ongoing tobacco use, presents to the hospital with complaints of shortness of breath. Patient was recently discharged from the hospital on 9/19 after being treated for a COPD exacerbation. He reports that his breathing never did quite improved since his discharge. He has been short of breath since his last hospital stay and this has gotten progressively worse, especially the last several days. He reports having a productive cough. He has felt feverish although has not checked his temperature. He's been having diaphoresis at night. Denies any vomiting, diarrhea, dysuria. The patient continues to smoke half a pack of cigarettes a day. On further questioning, his family reports that the patient becomes extremely short of breath when ambulating to his bathroom from his bedroom. This appears to be his baseline functional status. He was evaluated in the emergency room and noted to be hypoxic on room air with wheezing and shortness of breath. He is admitted for further treatment of COPD exacerbation   Review of Systems:  Pertinent positives as per HPI, otherwise negative  Past Medical History  Diagnosis Date  . Tobacco use   . Murmur   . Cough   . 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 Tricuspid regurgitation 08/26/2014    Mild-mod; Mild Pulm HTN  48 mmHg  . CAP (community acquired pneumonia) 08/25/2014    With mild sepsis.  . Mild mitral valve prolapse 08/26/2014  . COPD (chronic obstructive pulmonary disease)    History reviewed. No pertinent past surgical history. Social History:  reports that he has been smoking Cigarettes.  He has been smoking about 0.50 packs per day. He does not have any smokeless  tobacco history on file. He reports that he does not drink alcohol. His drug history is not on file.  No Known Allergies  History reviewed. No pertinent family history.   Prior to Admission medications   Medication Sig Start Date End Date Taking? Authorizing Provider  albuterol (PROVENTIL HFA;VENTOLIN HFA) 108 (90 BASE) MCG/ACT inhaler Inhale 2 puffs into the lungs 3 (three) times daily. 08/27/14  Yes Elliot Cousinenise Fisher, MD  calcium carbonate (TUMS - DOSED IN MG ELEMENTAL CALCIUM) 500 MG chewable tablet Chew 1 tablet by mouth daily as needed for indigestion or heartburn.   Yes Historical Provider, MD  ferrous sulfate 325 (65 FE) MG tablet Take 1 tablet (325 mg total) by mouth 2 (two) times daily with a meal. 08/27/14  Yes Elliot Cousinenise Fisher, MD  ibuprofen (ADVIL,MOTRIN) 200 MG tablet Take 1,000 mg by mouth every 8 (eight) hours as needed for moderate pain.   Yes Historical Provider, MD  oxymetazoline (AFRIN) 0.05 % nasal spray Place 1 spray into both nostrils 2 (two) times daily as needed for congestion. 08/27/14  Yes Elliot Cousinenise Fisher, MD   Physical Exam: Filed Vitals:   09/29/14 0730 09/29/14 0800 09/29/14 0815 09/29/14 0859  BP: 130/65 135/71  149/67  Pulse: 90 84 87 94  Temp:    98.3 F (36.8 C)  TempSrc:    Oral  Resp: 23 18 26 20   Height:    5\' 6"  (1.676 m)  Weight:    45.36 kg (100 lb)  SpO2:   91% 97%    Wt Readings from Last 3 Encounters:  09/29/14 45.36  kg (100 lb)  08/26/14 45.7 kg (100 lb 12 oz)    General:  Appears calm and comfortable, frail Eyes: PERRL, normal lids, irises & conjunctiva ENT: grossly normal hearing, lips & tongue, poor dentition Neck: no LAD, masses or thyromegaly Cardiovascular: RRR, no m/r/g. No LE edema. Telemetry: SR, no arrhythmias  Respiratory: diminished breath sounds bilaterally with scattered rhonchi. Normal respiratory effort. Abdomen: soft, ntnd Skin: no rash or induration seen on limited exam Musculoskeletal: grossly normal tone  BUE/BLE Psychiatric: grossly normal mood and affect, speech fluent and appropriate Neurologic: grossly non-focal.          Labs on Admission:  Basic Metabolic Panel:  Recent Labs Lab 09/29/14 0536  NA 141  K 4.3  CL 101  CO2 27  GLUCOSE 97  BUN 18  CREATININE 0.98  CALCIUM 9.4   Liver Function Tests:  Recent Labs Lab 09/29/14 0536  AST 16  ALT 15  ALKPHOS 121*  BILITOT 0.4  PROT 7.6  ALBUMIN 3.7   No results found for this basename: LIPASE, AMYLASE,  in the last 168 hours No results found for this basename: AMMONIA,  in the last 168 hours CBC:  Recent Labs Lab 09/29/14 0536  WBC 16.1*  NEUTROABS 12.7*  HGB 13.8  HCT 41.7  MCV 96.8  PLT 371   Cardiac Enzymes:  Recent Labs Lab 09/29/14 0536  TROPONINI <0.30    BNP (last 3 results)  Recent Labs  08/25/14 0838 09/29/14 0536  PROBNP 656.8* 750.3*   CBG: No results found for this basename: GLUCAP,  in the last 168 hours  Radiological Exams on Admission: Dg Chest Portable 1 View  09/29/2014   CLINICAL DATA:  Shortness of breath starting this morning.  EXAM: PORTABLE CHEST - 1 VIEW  COMPARISON:  On 08/25/2014  FINDINGS: Normal heart size and pulmonary vascularity. Emphysematous changes and scattered fibrosis in the lungs. Improving infiltration in the left mid lung since prior study. Mild blunting of costophrenic angles probably represents pleural thickening.  IMPRESSION: Emphysematous changes and fibrosis in the lungs. Improving infiltrate in the left mid lung since prior study.   Electronically Signed   By: Burman NievesWilliam  Stevens M.D.   On: 09/29/2014 06:34    EKG: Independently reviewed. No acute findings  Assessment/Plan Principal Problem:   COPD exacerbation Active Problems:   Acute respiratory failure with hypoxia   Tobacco use disorder   Severe malnutrition   1. COPD exacerbation. Start the patient on intravenous steroids, bronchodilators and antibiotics. Add flutter valve and mucolytics.  Wean down oxygen as tolerated 2. Acute respiratory failure related to #1. Will try to wean off oxygen as tolerated. Patient is on room air home. He may in fact need to be on chronic oxygen, but this will be evaluated during his hospital course. 3. Tobacco use disorder. Patient extensively counseled on the importance of tobacco cessation and the effects of tobacco related to his disease process. We'll start the patient on a nicotine patch. 4. Severe malnutrition. Nutrition consult.   Code Status: full code DVT Prophylaxis: lovenox Family Communication: discussed with patient and wife at the bedside Disposition Plan: discharge home once improved  Time spent: 60mins  [facility] LLCMEMON,Vincent Rosales [facility] Pager [phone]

## 2014-09-30 DIAGNOSIS — E43 Unspecified severe protein-calorie malnutrition: Secondary | ICD-10-CM | POA: Insufficient documentation

## 2014-09-30 LAB — BASIC METABOLIC PANEL
Anion gap: 14 (ref 5–15)
BUN: 23 mg/dL (ref 6–23)
CHLORIDE: 100 meq/L (ref 96–112)
CO2: 26 mEq/L (ref 19–32)
Calcium: 9 mg/dL (ref 8.4–10.5)
Creatinine, Ser: 1.09 mg/dL (ref 0.50–1.35)
GFR, EST AFRICAN AMERICAN: 82 mL/min — AB (ref >90)
GFR, EST NON AFRICAN AMERICAN: 71 mL/min — AB (ref >90)
Glucose, Bld: 174 mg/dL — ABNORMAL HIGH (ref 70–99)
POTASSIUM: 3.4 meq/L — AB (ref 3.7–5.3)
SODIUM: 140 meq/L (ref 137–147)

## 2014-09-30 LAB — URINALYSIS, ROUTINE W REFLEX MICROSCOPIC
Bilirubin Urine: NEGATIVE
Glucose, UA: NEGATIVE mg/dL
Hgb urine dipstick: NEGATIVE
Ketones, ur: NEGATIVE mg/dL
Leukocytes, UA: NEGATIVE
Nitrite: NEGATIVE
PROTEIN: 30 mg/dL — AB
Specific Gravity, Urine: 1.02 (ref 1.005–1.030)
UROBILINOGEN UA: 0.2 mg/dL (ref 0.0–1.0)
pH: 7 (ref 5.0–8.0)

## 2014-09-30 LAB — CBC
HCT: 34.5 % — ABNORMAL LOW (ref 39.0–52.0)
HEMOGLOBIN: 11.6 g/dL — AB (ref 13.0–17.0)
MCH: 31.8 pg (ref 26.0–34.0)
MCHC: 33.6 g/dL (ref 30.0–36.0)
MCV: 94.5 fL (ref 78.0–100.0)
PLATELETS: 333 10*3/uL (ref 150–400)
RBC: 3.65 MIL/uL — AB (ref 4.22–5.81)
RDW: 13.7 % (ref 11.5–15.5)
WBC: 21.8 10*3/uL — AB (ref 4.0–10.5)

## 2014-09-30 LAB — URINE MICROSCOPIC-ADD ON

## 2014-09-30 MED ORDER — TRAMADOL HCL 50 MG PO TABS
50.0000 mg | ORAL_TABLET | Freq: Four times a day (QID) | ORAL | Status: DC | PRN
  Administered 2014-09-30 (×2): 50 mg via ORAL
  Filled 2014-09-30 (×3): qty 1

## 2014-09-30 MED ORDER — METHYLPREDNISOLONE SODIUM SUCC 125 MG IJ SOLR
60.0000 mg | Freq: Two times a day (BID) | INTRAMUSCULAR | Status: DC
Start: 2014-09-30 — End: 2014-10-01
  Administered 2014-09-30 – 2014-10-01 (×2): 60 mg via INTRAVENOUS
  Filled 2014-09-30 (×2): qty 2

## 2014-09-30 MED ORDER — POTASSIUM CHLORIDE CRYS ER 20 MEQ PO TBCR
40.0000 meq | EXTENDED_RELEASE_TABLET | Freq: Once | ORAL | Status: AC
  Administered 2014-09-30: 40 meq via ORAL
  Filled 2014-09-30: qty 2

## 2014-09-30 NOTE — Progress Notes (Signed)
[facility] PROGRESS NOTE  Vincent Robertson [name] DOB: 03/21/1951 DOA: 09/29/2014 PCP: No PCP Per Patient  Assessment/Plan: 1. COPD exacerbation. Patient is starting to feel better. Continue bronchodilators and antibiotics. We'll start to wean down steroids. Continue pulmonary hygiene. His baseline functional capacity appears to be poor. 2. Acute respiratory failure secondary to #1. Continue to wean down oxygen as tolerated. 3. Tobacco use. Continue nicotine patch. 4. Severe malnutrition. Impression following.  5. Leukocytosis. Likely related to steroids.  Code Status: full code Family Communication: discussed with patient, no family present Disposition Plan: discharge home once improved   Consultants:    Procedures:    Antibiotics:  augmentin 10/22>>  HPI/Subjective: Feeling better today, continues to have productive cough  Objective: Filed Vitals:   09/30/14 1543  BP:   Pulse: 112  Temp:   Resp:     Intake/Output Summary (Last 24 hours) at 09/30/14 1650 Last data filed at 09/30/14 1100  Gross per 24 hour  Intake 1501.25 ml  Output      0 ml  Net 1501.25 ml   Filed Weights   09/29/14 0535 09/29/14 0859  Weight: 45.36 kg (100 lb) 45.36 kg (100 lb)    Exam:   General:  NAD, thin, frail  Cardiovascular: s1, s2, tachycardic  Respiratory: diminished breath sounds bilaterally  Abdomen: soft, nt, nd, bs+  Musculoskeletal:  No edema b/l   Data Reviewed: Basic Metabolic Panel:  Recent Labs Lab 09/29/14 0536 09/30/14 0515  NA 141 140  K 4.3 3.4*  CL 101 100  CO2 27 26  GLUCOSE 97 174*  BUN 18 23  CREATININE 0.98 1.09  CALCIUM 9.4 9.0   Liver Function Tests:  Recent Labs Lab 09/29/14 0536  AST 16  ALT 15  ALKPHOS 121*  BILITOT 0.4  PROT 7.6  ALBUMIN 3.7   No results found for this basename: LIPASE, AMYLASE,  in the last 168 hours No results found for this basename: AMMONIA,  in the last 168 hours CBC:  Recent  Labs Lab 09/29/14 0536 09/30/14 0515  WBC 16.1* 21.8*  NEUTROABS 12.7*  --   HGB 13.8 11.6*  HCT 41.7 34.5*  MCV 96.8 94.5  PLT 371 333   Cardiac Enzymes:  Recent Labs Lab 09/29/14 0536  TROPONINI <0.30   BNP (last 3 results)  Recent Labs  08/25/14 0838 09/29/14 0536  PROBNP 656.8* 750.3*   CBG: No results found for this basename: GLUCAP,  in the last 168 hours  Recent Results (from the past 240 hour(s))  MRSA PCR SCREENING     Status: None   Collection Time    09/29/14 12:15 PM      Result Value Ref Range Status   MRSA by PCR NEGATIVE  NEGATIVE Final   Comment:            The GeneXpert MRSA Assay (FDA     approved for NASAL specimens     only), is one component of a     comprehensive MRSA colonization     surveillance program. It is not     intended to diagnose MRSA     infection nor to guide or     monitor treatment for     MRSA infections.     Studies: Dg Chest Portable 1 View  09/29/2014   CLINICAL DATA:  Shortness of breath starting this morning.  EXAM: PORTABLE CHEST - 1 VIEW  COMPARISON:  On 08/25/2014  FINDINGS: Normal heart size and pulmonary vascularity. Emphysematous  changes and scattered fibrosis in the lungs. Improving infiltration in the left mid lung since prior study. Mild blunting of costophrenic angles probably represents pleural thickening.  IMPRESSION: Emphysematous changes and fibrosis in the lungs. Improving infiltrate in the left mid lung since prior study.   Electronically Signed   By: Burman NievesWilliam  Stevens M.D.   On: 09/29/2014 06:34    Scheduled Meds: . amoxicillin-clavulanate  1 tablet Oral Q12H  . enoxaparin (LOVENOX) injection  40 mg Subcutaneous Q24H  . feeding supplement  237 mL Oral TID BM  . ferrous sulfate  325 mg Oral BID WC  . guaiFENesin  600 mg Oral BID  . ipratropium-albuterol  3 mL Nebulization Q4H  . [START ON 10/01/2014] methylPREDNISolone (SOLU-MEDROL) injection  60 mg Intravenous Q12H  . mometasone-formoterol  2 puff  Inhalation BID  . nicotine  21 mg Transdermal Daily  . potassium chloride  40 mEq Oral Once  . sodium chloride  3 mL Intravenous Q12H   Continuous Infusions:   Principal Problem:   COPD exacerbation Active Problems:   Acute respiratory failure with hypoxia   Tobacco use disorder   Severe malnutrition   Protein-calorie malnutrition, severe    Time spent: 25mins    [facility]MEMON,[name]  [facility] Pager [phone]. If 7PM-7AM, please contact night-coverage at [email or web address], password [email or web address] 09/30/2014, 4:50 PM  LOS: 1 day

## 2014-09-30 NOTE — Care Management Note (Signed)
    Page 1 of 1   09/30/2014     3:25:41 PM CARE MANAGEMENT NOTE 09/30/2014  Patient:  Vincent Robertson,Vincent Robertson   Account Number:  [id number]  Date Initiated:  09/30/2014  Documentation initiated by:  Sharrie RothmanBLACKWELL,Hugh Garrow C  Subjective/Objective Assessment:   Pt admitted from home with COPD exacerbation. Pt lives with his fiance and will return home at discharge. Pt is awaiting Medicaid to come through in November and no PCP.     Action/Plan:   [facility]. IF pt needs home O2 or neb machine, [facility] Weekend staff can arrange. Pt aware that he is not eligible for MATCH, just got last month.   Anticipated DC Date:  10/03/2014   Anticipated DC Plan:  HOME/SELF CARE  In-house referral  Financial Counselor      DC Planning Services  CM consult      Choice offered to / List presented to:             Status of service:  Completed, signed off Medicare Important Message given?   (If response is "NO", the following Medicare IM given date fields will be blank) Date Medicare IM given:   Medicare IM given by:   Date Additional Medicare IM given:   Additional Medicare IM given by:    Discharge Disposition:  HOME/SELF CARE  Per UR Regulation:    If discussed at Long Length of Stay Meetings, dates discussed:    Comments:  09/30/14 1500 Arlyss Queenammy Mariluz Crespo, RN BSN CM

## 2014-09-30 NOTE — Progress Notes (Signed)
Pt ambulated in hallway on RA with standby assist from NT. Pt tolerated well. No c/o SOB or pain during ambulation.  Pt ambulated approximately 200 feet.

## 2014-10-01 MED ORDER — PREDNISONE 10 MG PO TABS: ORAL_TABLET | ORAL | Status: DC

## 2014-10-01 MED ORDER — AMOXICILLIN-POT CLAVULANATE 875-125 MG PO TABS: 1.0000 | ORAL_TABLET | Freq: Two times a day (BID) | ORAL | Status: DC

## 2014-10-01 MED ORDER — HYDROXYZINE HCL 25 MG PO TABS: 25.0000 mg | ORAL_TABLET | Freq: Three times a day (TID) | ORAL | Status: DC | PRN

## 2014-10-01 MED ORDER — IPRATROPIUM-ALBUTEROL 0.5-2.5 (3) MG/3ML IN SOLN: 3.0000 mL | Freq: Three times a day (TID) | RESPIRATORY_TRACT | Status: DC

## 2014-10-01 MED ORDER — GUAIFENESIN ER 600 MG PO TB12: 600.0000 mg | ORAL_TABLET | Freq: Two times a day (BID) | ORAL | Status: DC

## 2014-10-01 MED ORDER — TRAMADOL HCL 50 MG PO TABS: 50.0000 mg | ORAL_TABLET | Freq: Four times a day (QID) | ORAL | Status: DC | PRN

## 2014-10-01 MED ORDER — ALBUTEROL SULFATE (2.5 MG/3ML) 0.083% IN NEBU: 2.5000 mg | INHALATION_SOLUTION | Freq: Four times a day (QID) | RESPIRATORY_TRACT | Status: AC | PRN

## 2014-10-01 MED ORDER — NICOTINE 21 MG/24HR TD PT24
21.0000 mg | MEDICATED_PATCH | Freq: Every day | TRANSDERMAL | Status: DC
Start: 2014-10-01 — End: 2015-06-14

## 2014-10-01 NOTE — Progress Notes (Signed)
DME Neb machine order, HH RN order, Face-to-face sheet, Facesheet, D/C summary faxed to [facility]. Pt made aware. Verbalized understanding.

## 2014-10-01 NOTE — Discharge Summary (Signed)
Physician Discharge Summary  Vincent Robertson ZOX:[id number] DOB: 1951/12/01 DOA: 09/29/2014  PCP: No PCP Per Patient  Admit date: 09/29/2014 Discharge date: 10/01/2014  Time spent: 40 minutes  Recommendations for Outpatient Follow-up:  1. Patient has been referred to [facility] clinic for primary care follow up  Discharge Diagnoses:  Principal Problem:   COPD exacerbation Active Problems:   Acute respiratory failure with hypoxia   Tobacco use disorder   Severe malnutrition   Protein-calorie malnutrition, severe   Discharge Condition: improved  Diet recommendation: low salt  Filed Weights   09/29/14 0535 09/29/14 0859  Weight: 45.36 kg (100 lb) 45.36 kg (100 lb)    History of present illness:  Vincent Robertson is a 63 y.o. male with history of COPD and ongoing tobacco use, who presented to the hospital with complaints of shortness of breath. Patient was recently discharged from the hospital on 9/19 after being treated for a COPD exacerbation. He reported that his breathing never did quite improved since his discharge. He had been short of breath since his last hospital stay and this had gotten progressively worse, especially in the last several days prior to admission. He reported having a productive cough. He had felt feverish although had not checked his temperature. He had been having diaphoresis at night. Denied any vomiting, diarrhea, dysuria. The patient continued to smoke half a pack of cigarettes a day. On further questioning, his family reported that the patient normally becomes extremely short of breath when ambulating to his bathroom from his bedroom. This appeared to be his baseline functional status. He was evaluated in the emergency room and noted to be hypoxic on room air with wheezing and shortness of breath. He was admitted for further treatment of COPD exacerbation   Hospital Course:  This patient was admitted to the hospital with progressive shortness of  breath related to COPD exacerbation. He was started on antibiotics, intravenous steroids and bronchodilators. With supportive treatment, his respiratory status has improved. He is weaned off of oxygen and is breathing comfortably on room air. He is able to ambulate in the hall without any significant difficulty. He has been transitioned to prednisone taper. We will arrange for a nebulizer treatment at home. He was strongly advised to abstain from any further smoking. He appears to be back to baseline and is ready for discharge home.  Procedures:    Consultations:    Discharge Exam: Filed Vitals:   10/01/14 0618  BP: 132/50  Pulse: 88  Temp: 98.2 F (36.8 C)  Resp: 24    General: NAD Cardiovascular: s1, s2, rrr Respiratory: CTA B  Discharge Instructions You were cared for by a hospitalist during your hospital stay. If you have any questions about your discharge medications or the care you received while you were in the hospital after you are discharged, you can call the unit and asked to speak with the hospitalist on call if the hospitalist that took care of you is not available. Once you are discharged, your primary care physician will handle any further medical issues. Please note that NO REFILLS for any discharge medications will be authorized once you are discharged, as it is imperative that you return to your primary care physician (or establish a relationship with a primary care physician if you do not have one) for your aftercare needs so that they can reassess your need for medications and monitor your lab values.  Discharge Instructions   Call MD for:  difficulty breathing, headache or  visual disturbances    Complete by:  As directed      Call MD for:  temperature >100.4    Complete by:  As directed      DME Nebulizer machine    Complete by:  As directed      Diet - low sodium heart healthy    Complete by:  As directed      Face-to-face encounter (required for  Medicare/Medicaid patients)    Complete by:  As directed   I [name],JEHANZEB certify that this patient is under my care and that I, or a nurse practitioner or physician's assistant working with me, had a face-to-face encounter that meets the physician face-to-face encounter requirements with this patient on 10/01/2014. The encounter with the patient was in whole, or in part for the following medical condition(s) which is the primary reason for home health care (List medical condition): copd exacerbation, would benefit from home health RN  The encounter with the patient was in whole, or in part, for the following medical condition, which is the primary reason for home health care:  copd exacerbation  I certify that, based on my findings, the following services are medically necessary home health services:  Nursing  My clinical findings support the need for the above services:  Shortness of breath with activity  Further, I certify that my clinical findings support that this patient is homebound due to:  Shortness of Breath with activity  Reason for Medically Necessary Home Health Services:  Skilled Nursing- Teaching of Disease Process/Symptom Management     Home Health    Complete by:  As directed   To provide the following care/treatments:  RN     Increase activity slowly    Complete by:  As directed           Current Discharge Medication List    START taking these medications   Details  albuterol (PROVENTIL) (2.5 MG/3ML) 0.083% nebulizer solution Take 3 mLs (2.5 mg total) by nebulization every 6 (six) hours as needed for wheezing. Qty: 75 mL, Refills: 12    amoxicillin-clavulanate (AUGMENTIN) 875-125 MG per tablet Take 1 tablet by mouth every 12 (twelve) hours. Qty: 14 tablet, Refills: 0    guaiFENesin (MUCINEX) 600 MG 12 hr tablet Take 1 tablet (600 mg total) by mouth 2 (two) times daily. Qty: 14 tablet, Refills: 0    hydrOXYzine (ATARAX/VISTARIL) 25 MG tablet Take 1 tablet (25 mg total)  by mouth 3 (three) times daily as needed for anxiety. Qty: 30 tablet, Refills: 0    nicotine (NICODERM CQ - DOSED IN MG/24 HOURS) 21 mg/24hr patch Place 1 patch (21 mg total) onto the skin daily. Qty: 28 patch, Refills: 0    predniSONE (DELTASONE) 10 MG tablet Take 40mg  po daily for 2 days then 30mg  po daily for 2 days then 20mg  po daily for 2 days then 10mg  po daily for 2 days then stop Qty: 20 tablet, Refills: 0    traMADol (ULTRAM) 50 MG tablet Take 1 tablet (50 mg total) by mouth every 6 (six) hours as needed for severe pain. Qty: 30 tablet, Refills: 0      CONTINUE these medications which have NOT CHANGED   Details  albuterol (PROVENTIL HFA;VENTOLIN HFA) 108 (90 BASE) MCG/ACT inhaler Inhale 2 puffs into the lungs 3 (three) times daily. Qty: 1 Inhaler, Refills: 2    calcium carbonate (TUMS - DOSED IN MG ELEMENTAL CALCIUM) 500 MG chewable tablet Chew 1 tablet by mouth daily as  needed for indigestion or heartburn.    ferrous sulfate 325 (65 FE) MG tablet Take 1 tablet (325 mg total) by mouth 2 (two) times daily with a meal.    ibuprofen (ADVIL,MOTRIN) 200 MG tablet Take 1,000 mg by mouth every 8 (eight) hours as needed for moderate pain.    oxymetazoline (AFRIN) 0.05 % nasal spray Place 1 spray into both nostrils 2 (two) times daily as needed for congestion.       No Known Allergies Follow-up Information   [facility] (at 1:30)    Contact information:   [address] [place] [address] [phone]        The results of significant diagnostics from this hospitalization (including imaging, microbiology, ancillary and laboratory) are listed below for reference.    Significant Diagnostic Studies: Dg Chest Portable 1 View  09/29/2014   CLINICAL DATA:  Shortness of breath starting this morning.  EXAM: PORTABLE CHEST - 1 VIEW  COMPARISON:  On 08/25/2014  FINDINGS: Normal heart size and pulmonary vascularity. Emphysematous  changes and scattered fibrosis in the lungs. Improving infiltration in the left mid lung since prior study. Mild blunting of costophrenic angles probably represents pleural thickening.  IMPRESSION: Emphysematous changes and fibrosis in the lungs. Improving infiltrate in the left mid lung since prior study.   Electronically Signed   By: Burman NievesWilliam  Stevens M.D.   On: 09/29/2014 06:34    Microbiology: Recent Results (from the past 240 hour(s))  MRSA PCR SCREENING     Status: None   Collection Time    09/29/14 12:15 PM      Result Value Ref Range Status   MRSA by PCR NEGATIVE  NEGATIVE Final   Comment:            The GeneXpert MRSA Assay (FDA     approved for NASAL specimens     only), is one component of a     comprehensive MRSA colonization     surveillance program. It is not     intended to diagnose MRSA     infection nor to guide or     monitor treatment for     MRSA infections.     Labs: Basic Metabolic Panel:  Recent Labs Lab 09/29/14 0536 09/30/14 0515  NA 141 140  K 4.3 3.4*  CL 101 100  CO2 27 26  GLUCOSE 97 174*  BUN 18 23  CREATININE 0.98 1.09  CALCIUM 9.4 9.0   Liver Function Tests:  Recent Labs Lab 09/29/14 0536  AST 16  ALT 15  ALKPHOS 121*  BILITOT 0.4  PROT 7.6  ALBUMIN 3.7   No results found for this basename: LIPASE, AMYLASE,  in the last 168 hours No results found for this basename: AMMONIA,  in the last 168 hours CBC:  Recent Labs Lab 09/29/14 0536 09/30/14 0515  WBC 16.1* 21.8*  NEUTROABS 12.7*  --   HGB 13.8 11.6*  HCT 41.7 34.5*  MCV 96.8 94.5  PLT 371 333   Cardiac Enzymes:  Recent Labs Lab 09/29/14 0536  TROPONINI <0.30   BNP: BNP (last 3 results)  Recent Labs  08/25/14 0838 09/29/14 0536  PROBNP 656.8* 750.3*   CBG: No results found for this basename: GLUCAP,  in the last 168 hours     Signed:  [name],JEHANZEB  [facility] 10/01/2014, 12:19 PM

## 2014-10-01 NOTE — Discharge Instructions (Signed)

## 2014-10-01 NOTE — Progress Notes (Signed)
Pt discharged home today per Dr. [name]. Pt's IV site D/C'd and WDL. Pt's VSS. Pt provided with home medication list, discharge instructions and prescriptions. Verbalized understanding. Pt left floor via WC in stable condition accompanied by NT.  

## 2014-10-13 ENCOUNTER — Ambulatory Visit (INDEPENDENT_AMBULATORY_CARE_PROVIDER_SITE_OTHER): Payer: Self-pay | Admitting: Psychology

## 2014-10-13 DIAGNOSIS — F4322 Adjustment disorder with anxiety: Secondary | ICD-10-CM

## 2014-11-09 ENCOUNTER — Ambulatory Visit (HOSPITAL_COMMUNITY): Payer: Self-pay | Admitting: Psychology

## 2014-11-16 ENCOUNTER — Encounter (HOSPITAL_COMMUNITY): Payer: Self-pay | Admitting: Psychology

## 2014-11-16 NOTE — Progress Notes (Signed)
   Patient:  Vincent Robertson   DOB: 12/03/1951  MR Number: [id number]  [facility] [address] [place] [address] Dept: [phone]  Start: 9 AM End: 10 AM  Provider/Observer:     Hershal CoriaJohn R Nykiah Ma PSYD  Chief Complaint:      Chief Complaint  Patient presents with  . Anxiety  . Depression  . Stress    Reason For Service:     The patient was self-referred because of difficulties with a new relationship he is been engaged in. The patient reports that when he was a teenager and very [place] he got married and they were married for 5 years. There relationships last marriage ended due to his infidelity but he always continued to have very strong feelings for his first wife. Recently, they have gotten back together after 30 years apart and mild the relationship is going very well and are talking about getting married again he has been engaging in behaviors that have pushed her away at times. He is trying to find out how we can stop these types of activities. The patient reports that he loves this woman but that he "keeps messing and." The patient reports that whenever they get very close he tends to chaser way. However, recently he was put on medications including prednisone and reports that he became much more agitated during his medication and these behaviors exacerbated with his wife/girlfriend the patient reports that he is likely still harboring deal from the marriage the first time. Narrowing was her infidelity but they also ended up having to abort the child they were going to have because of Rh factor issues. The patient has been married 3 times and his ex-wife/girlfriend has been married 4 times Intermarriage to each other.  Interventions Strategy:  Cognitive/behavioral psychotherapeutic interventions  Participation Level:   Active  Participation Quality:  Appropriate       Behavioral Observation:  Well Groomed, Alert, and Appropriate.   Current Psychosocial Factors: The patient reports that the situation with he and his girlfriend have been going very well and that he has been using some of the therapies and interventions we have develop.  Content of Session:   Reviewed current symptoms and continued work on therapeutic interventions around building better coping skills and strategies with his adjustment difficulties.  Current Status:   The patient reports that he is doing much better with his  Patient Progress:   Stable  Target Goals:   Target goals include building better relationships and understanding his destructive tendencies and relationships.  Last Reviewed:   10/12/2014  Goals Addressed Today:    Goals addressed building better resources and relationships.  Impression/Diagnosis:  The patient is having some adjustment difficulties and worried about maintaining a relationship. He had been married to this woman 30 years ago and the marriage ended when he was disloyal to her and had a fair. The patient reports that he continued to have and maintain feelings for her and then reconnected Begun a relationship again. However, he is very anxious and worried that he will do things in this relationship.   Diagnosis:    Axis I: Adjustment disorder with anxious mood

## 2015-06-03 ENCOUNTER — Emergency Department (HOSPITAL_COMMUNITY): Payer: Self-pay

## 2015-06-03 ENCOUNTER — Encounter (HOSPITAL_COMMUNITY): Payer: Self-pay

## 2015-06-03 ENCOUNTER — Emergency Department (HOSPITAL_COMMUNITY)
Admission: EM | Admit: 2015-06-03 | Discharge: 2015-06-04 | Disposition: A | Discharge status: Other Acute Inpatient Hospital | Payer: Self-pay | Attending: Emergency Medicine | Admitting: Emergency Medicine

## 2015-06-03 DIAGNOSIS — Z7952 Long term (current) use of systemic steroids: Secondary | ICD-10-CM | POA: Insufficient documentation

## 2015-06-03 DIAGNOSIS — Z8701 Personal history of pneumonia (recurrent): Secondary | ICD-10-CM | POA: Insufficient documentation

## 2015-06-03 DIAGNOSIS — J441 Chronic obstructive pulmonary disease with (acute) exacerbation: Secondary | ICD-10-CM | POA: Insufficient documentation

## 2015-06-03 DIAGNOSIS — Z79899 Other long term (current) drug therapy: Secondary | ICD-10-CM | POA: Insufficient documentation

## 2015-06-03 DIAGNOSIS — R011 Cardiac murmur, unspecified: Secondary | ICD-10-CM | POA: Insufficient documentation

## 2015-06-03 DIAGNOSIS — Z7951 Long term (current) use of inhaled steroids: Secondary | ICD-10-CM | POA: Insufficient documentation

## 2015-06-03 DIAGNOSIS — Z87891 Personal history of nicotine dependence: Secondary | ICD-10-CM | POA: Insufficient documentation

## 2015-06-03 DIAGNOSIS — Z8679 Personal history of other diseases of the circulatory system: Secondary | ICD-10-CM | POA: Insufficient documentation

## 2015-06-03 DIAGNOSIS — R45851 Suicidal ideations: Secondary | ICD-10-CM | POA: Insufficient documentation

## 2015-06-03 LAB — BASIC METABOLIC PANEL
ANION GAP: 7 (ref 5–15)
BUN: 21 mg/dL — ABNORMAL HIGH (ref 6–20)
CALCIUM: 9 mg/dL (ref 8.9–10.3)
CO2: 27 mmol/L (ref 22–32)
CREATININE: 1.22 mg/dL (ref 0.61–1.24)
Chloride: 106 mmol/L (ref 101–111)
GFR calc Af Amer: >60 mL/min (ref >60)
Glucose, Bld: 87 mg/dL (ref 65–99)
POTASSIUM: 3.8 mmol/L (ref 3.5–5.1)
SODIUM: 140 mmol/L (ref 135–145)

## 2015-06-03 LAB — CBC WITH DIFFERENTIAL/PLATELET
Basophils Absolute: 0.1 10*3/uL (ref 0.0–0.1)
Basophils Relative: 2 % — ABNORMAL HIGH (ref 0–1)
EOS ABS: 0.1 10*3/uL (ref 0.0–0.7)
Eosinophils Relative: 2 % (ref 0–5)
HCT: 39.3 % (ref 39.0–52.0)
Hemoglobin: 12.7 g/dL — ABNORMAL LOW (ref 13.0–17.0)
Lymphocytes Relative: 35 % (ref 12–46)
Lymphs Abs: 2.4 10*3/uL (ref 0.7–4.0)
MCH: 30.2 pg (ref 26.0–34.0)
MCHC: 32.3 g/dL (ref 30.0–36.0)
MCV: 93.3 fL (ref 78.0–100.0)
MONO ABS: 0.7 10*3/uL (ref 0.1–1.0)
MONOS PCT: 10 % (ref 3–12)
Neutro Abs: 3.5 10*3/uL (ref 1.7–7.7)
Neutrophils Relative %: 51 % (ref 43–77)
Platelets: 237 10*3/uL (ref 150–400)
RBC: 4.21 MIL/uL — ABNORMAL LOW (ref 4.22–5.81)
RDW: 13.8 % (ref 11.5–15.5)
WBC: 6.8 10*3/uL (ref 4.0–10.5)

## 2015-06-03 LAB — RAPID URINE DRUG SCREEN, HOSP PERFORMED
Amphetamines: NOT DETECTED
BARBITURATES: NOT DETECTED
Benzodiazepines: NOT DETECTED
COCAINE: NOT DETECTED
OPIATES: NOT DETECTED
Tetrahydrocannabinol: NOT DETECTED

## 2015-06-03 LAB — TROPONIN I

## 2015-06-03 LAB — ETHANOL: Alcohol, Ethyl (B): 5 mg/dL — ABNORMAL HIGH (ref <5)

## 2015-06-03 MED ORDER — PREDNISONE 50 MG PO TABS
60.0000 mg | ORAL_TABLET | Freq: Once | ORAL | Status: AC
  Administered 2015-06-03: 60 mg via ORAL
  Filled 2015-06-03 (×2): qty 1

## 2015-06-03 MED ORDER — IPRATROPIUM-ALBUTEROL 0.5-2.5 (3) MG/3ML IN SOLN
3.0000 mL | Freq: Once | RESPIRATORY_TRACT | Status: AC
  Administered 2015-06-03: 3 mL via RESPIRATORY_TRACT
  Filled 2015-06-03: qty 3

## 2015-06-03 MED ORDER — IBUPROFEN 800 MG PO TABS
800.0000 mg | ORAL_TABLET | Freq: Once | ORAL | Status: AC
  Administered 2015-06-03: 800 mg via ORAL
  Filled 2015-06-03: qty 1

## 2015-06-03 NOTE — ED Notes (Signed)
Patient states its been hard to breath for the past could of days and has left rib pain with deep breathing X2 days. Patient states he was diagnosed with COPD and uses rescue inhaler at home.

## 2015-06-03 NOTE — ED Notes (Signed)
Patient states that he has been on Prozac for a while and feels like something is not right in his head. States that everything around him is falling apart. Maybe stress is causing the pain, I don't know. Patient states that he has been having thoughts of hurting himself and that it is scary due to his upbringing in a [place] [employer] [place] and the straight line to hell is suicide.

## 2015-06-03 NOTE — ED Provider Notes (Addendum)
CSN: [id number]     Arrival date & time 06/03/15  2004 History  This chart was scribed for Dione Booze, MD by Evon Slack, ED Scribe. This patient was seen in room APA05/APA05 and the patient's care was started at 11:22 PM.    Chief Complaint  Patient presents with  . Pleurisy   The history is provided by the patient. No language interpreter was used.   HPI Comments: JACQUEES GONGORA is a 64 y.o. male with PMHx COPD who presents to the Emergency Department complaining of a constant difficulty breathing and chest pain onset 2 days prior. He rates the severity of his pain 7/10. Pt states that the pain is worse when taking deep breaths. Pt states that he has used his albuterol inhaler that provides slight relief. Pt states that he is prescribed a Dulera inhaler that he his suppose to inhale twice a day. Denies cough, fever, chills, nausea or diaphoresis. Pt states that he has quit smoking 8 months prior.   Past Medical History  Diagnosis Date  . Tobacco use   . Murmur   . Cough   . 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 Tricuspid regurgitation 08/26/2014    Mild-mod; Mild Pulm HTN  48 mmHg  . CAP (community acquired pneumonia) 08/25/2014    With mild sepsis.  . Mild mitral valve prolapse 08/26/2014  . COPD (chronic obstructive pulmonary disease)    History reviewed. No pertinent past surgical history. History reviewed. No pertinent family history. History  Substance Use Topics  . Smoking status: Former Smoker -- 0.50 packs/day    Types: Cigarettes    Quit date: 09/08/2014  . Smokeless tobacco: Not on file  . Alcohol Use: No    Review of Systems  Constitutional: Negative for fever and chills.  Respiratory: Positive for shortness of breath. Negative for cough.   Cardiovascular: Positive for chest pain.  Gastrointestinal: Negative for nausea.  All other systems reviewed and are negative.    Allergies  Review of patient's allergies indicates no known allergies.  Home  Medications   Prior to Admission medications   Medication Sig Start Date End Date Taking? Authorizing Provider  albuterol (PROVENTIL HFA;VENTOLIN HFA) 108 (90 BASE) MCG/ACT inhaler Inhale 2 puffs into the lungs 3 (three) times daily. 08/27/14  Yes Elliot Cousin, MD  albuterol (PROVENTIL) (2.5 MG/3ML) 0.083% nebulizer solution Take 3 mLs (2.5 mg total) by nebulization every 6 (six) hours as needed for wheezing. 10/01/14  Yes Erick Blinks, MD  ferrous sulfate 325 (65 FE) MG tablet Take 1 tablet (325 mg total) by mouth 2 (two) times daily with a meal. Patient taking differently: Take 325 mg by mouth daily.  08/27/14  Yes Elliot Cousin, MD  FLUoxetine (PROZAC) 20 MG capsule Take 20 mg by mouth daily.   Yes Historical Provider, MD  guaiFENesin (MUCINEX) 600 MG 12 hr tablet Take 1 tablet (600 mg total) by mouth 2 (two) times daily. Patient taking differently: Take 600 mg by mouth 2 (two) times daily as needed for to loosen phlegm.  10/01/14  Yes Erick Blinks, MD  ibuprofen (ADVIL,MOTRIN) 200 MG tablet Take 800 mg by mouth every 8 (eight) hours as needed for mild pain or moderate pain.    Yes Historical Provider, MD  mometasone-formoterol (DULERA) 100-5 MCG/ACT AERO Inhale 2 puffs into the lungs 2 (two) times daily.   Yes Historical Provider, MD  amoxicillin-clavulanate (AUGMENTIN) 875-125 MG per tablet Take 1 tablet by mouth every 12 (twelve) hours. 10/01/14   Durward Mallard  [name], MD  hydrOXYzine (ATARAX/VISTARIL) 25 MG tablet Take 1 tablet (25 mg total) by mouth 3 (three) times daily as needed for anxiety. 10/01/14   Erick Blinks, MD  nicotine (NICODERM CQ - DOSED IN MG/24 HOURS) 21 mg/24hr patch Place 1 patch (21 mg total) onto the skin daily. 10/01/14   Erick Blinks, MD  oxymetazoline (AFRIN) 0.05 % nasal spray Place 1 spray into both nostrils 2 (two) times daily as needed for congestion. 08/27/14   Elliot Cousin, MD  predniSONE (DELTASONE) 10 MG tablet Take 40mg  po daily for 2 days then 30mg  po daily  for 2 days then 20mg  po daily for 2 days then 10mg  po daily for 2 days then stop 10/01/14   Erick Blinks, MD  traMADol (ULTRAM) 50 MG tablet Take 1 tablet (50 mg total) by mouth every 6 (six) hours as needed for severe pain. 10/01/14   Erick Blinks, MD   BP 137/67 mmHg  Pulse 59  Temp(Src) 97.8 F (36.6 C) (Oral)  Resp 24  Ht 5\' 4"  (1.626 m)  Wt 105 lb (47.628 kg)  BMI 18.01 kg/m2  SpO2 98%   Physical Exam  Constitutional: He is oriented to person, place, and time. He appears well-developed and well-nourished. No distress.  HENT:  Head: Normocephalic and atraumatic.  Eyes: Conjunctivae and EOM are normal. Pupils are equal, round, and reactive to light.  Neck: Normal range of motion. Neck supple. No JVD present.  Cardiovascular: Normal rate, regular rhythm and normal heart sounds.   No murmur heard. Pulmonary/Chest: Effort normal and breath sounds normal. He has no wheezes. He has no rales. He exhibits tenderness.  Distant breath sounds. Chest localized pain right lower parasternal area with reproduces his pain.   Abdominal: Soft. Bowel sounds are normal. He exhibits no distension. There is no tenderness.  Musculoskeletal: Normal range of motion. He exhibits no edema or tenderness.  Lymphadenopathy:    He has no cervical adenopathy.  Neurological: He is alert and oriented to person, place, and time. No cranial nerve deficit. Coordination normal.  Skin: Skin is warm and dry. No rash noted.  Psychiatric: His behavior is normal. Thought content normal.  Nursing note and vitals reviewed.   ED Course  Procedures (including critical care time) DIAGNOSTIC STUDIES: Oxygen Saturation is 100% on RA, normal by my interpretation.    COORDINATION OF CARE: 11:25 PM-Discussed treatment plan with pt at bedside and pt agreed to plan.     Labs Review Results for orders placed or performed during the hospital encounter of 06/03/15  Ethanol  Result Value Ref Range   Alcohol, Ethyl (B) 5  (H) <5 mg/dL  Basic metabolic panel  Result Value Ref Range   Sodium 140 135 - 145 mmol/L   Potassium 3.8 3.5 - 5.1 mmol/L   Chloride 106 101 - 111 mmol/L   CO2 27 22 - 32 mmol/L   Glucose, Bld 87 65 - 99 mg/dL   BUN 21 (H) 6 - 20 mg/dL   Creatinine, Ser 1.61 0.61 - 1.24 mg/dL   Calcium 9.0 8.9 - 09.6 mg/dL   GFR calc non Af Amer >60 >60 mL/min   GFR calc Af Amer >60 >60 mL/min   Anion gap 7 5 - 15  Troponin I  Result Value Ref Range   Troponin I <0.03 <0.031 ng/mL  CBC with Differential  Result Value Ref Range   WBC 6.8 4.0 - 10.5 K/uL   RBC 4.21 (L) 4.22 - 5.81 MIL/uL   Hemoglobin  12.7 (L) 13.0 - 17.0 g/dL   HCT 94.0 76.8 - 08.8 %   MCV 93.3 78.0 - 100.0 fL   MCH 30.2 26.0 - 34.0 pg   MCHC 32.3 30.0 - 36.0 g/dL   RDW 11.0 31.5 - 94.5 %   Platelets 237 150 - 400 K/uL   Neutrophils Relative % 51 43 - 77 %   Neutro Abs 3.5 1.7 - 7.7 K/uL   Lymphocytes Relative 35 12 - 46 %   Lymphs Abs 2.4 0.7 - 4.0 K/uL   Monocytes Relative 10 3 - 12 %   Monocytes Absolute 0.7 0.1 - 1.0 K/uL   Eosinophils Relative 2 0 - 5 %   Eosinophils Absolute 0.1 0.0 - 0.7 K/uL   Basophils Relative 2 (H) 0 - 1 %   Basophils Absolute 0.1 0.0 - 0.1 K/uL  Urine rapid drug screen (hosp performed)  Result Value Ref Range   Opiates NONE DETECTED NONE DETECTED   Cocaine NONE DETECTED NONE DETECTED   Benzodiazepines NONE DETECTED NONE DETECTED   Amphetamines NONE DETECTED NONE DETECTED   Tetrahydrocannabinol NONE DETECTED NONE DETECTED   Barbiturates NONE DETECTED NONE DETECTED   Imaging Review Dg Chest 2 View  06/03/2015   CLINICAL DATA:  Difficulty breathing for a few days, LEFT rib pain with inspiration for 2 days. History of COPD, CHF. Prior smoker.  EXAM: CHEST  2 VIEW  COMPARISON:  Chest radiograph September 29, 2014  FINDINGS: Cardiomediastinal silhouette is unremarkable. Mild interstitial prominence and increased lung volumes consistent with COPD, pleural thickening at the lung bases. No pleural  effusion or focal consolidation. No pneumothorax. Soft tissue planes and included osseous structure nonsuspicious. Osteopenia.  IMPRESSION: COPD without acute cardiopulmonary process.   Electronically Signed   By: Awilda Metro M.D.   On: 06/03/2015 23:45     EKG Interpretation   Date/Time:  Saturday June 03 2015 20:27:11 EDT Ventricular Rate:  60 PR Interval:  136 QRS Duration: 102 QT Interval:  444 QTC Calculation: 444 R Axis:   69 Text Interpretation:  Sinus rhythm with occasional Premature ventricular  complexes Possible Left atrial enlargement Borderline ECG When compared  with ECG of 10/22/215, No significant change was found Confirmed by [facility]   MD, Otie Headlee ([id number]) on 06/03/2015 11:18:11 PM      MDM   Final diagnoses:  COPD exacerbation  Suicidal ideation      COPD exacerbation. He feels much better after albuterol with ipratropium. Chest x-ray shows no evidence of acute process. However, patient does claim that he has been having thoughts of hurting himself for the past week. He states that he has thought of what size rope and what them to use. He denies crying spells but has had early morning wakening and anhedonia. He denies hallucinations. He has been under the care of a therapist and was started on a mood stabilizer which has not helped. Consultation will be obtained with TTS.   TTS states patient meets inpatient criteria are currently working on appropriate placement.  I personally performed the services described in this documentation, which was scribed in my presence. The recorded information has been reviewed and is accurate.       Dione Booze, MD 06/04/15 0543  Patient has been accepted at [facility] by Dr. Guss Bunde.   Dione Booze, MD 06/04/15 0630

## 2015-06-04 MED ORDER — MOMETASONE FURO-FORMOTEROL FUM 100-5 MCG/ACT IN AERO
2.0000 | INHALATION_SPRAY | Freq: Two times a day (BID) | RESPIRATORY_TRACT | Status: DC
  Filled 2015-06-04: qty 8.8

## 2015-06-04 MED ORDER — ALBUTEROL SULFATE (2.5 MG/3ML) 0.083% IN NEBU: 2.5000 mg | INHALATION_SOLUTION | Freq: Four times a day (QID) | RESPIRATORY_TRACT | Status: DC | PRN

## 2015-06-04 MED ORDER — LORAZEPAM 1 MG PO TABS: 1.0000 mg | ORAL_TABLET | Freq: Three times a day (TID) | ORAL | Status: DC | PRN

## 2015-06-04 MED ORDER — ONDANSETRON HCL 4 MG PO TABS: 4.0000 mg | ORAL_TABLET | Freq: Three times a day (TID) | ORAL | Status: DC | PRN

## 2015-06-04 MED ORDER — ALUM & MAG HYDROXIDE-SIMETH 200-200-20 MG/5ML PO SUSP: 30.0000 mL | ORAL | Status: DC | PRN

## 2015-06-04 MED ORDER — FLUOXETINE HCL 20 MG PO CAPS: 20.0000 mg | ORAL_CAPSULE | Freq: Every day | ORAL | Status: DC

## 2015-06-04 MED ORDER — FERROUS SULFATE 325 (65 FE) MG PO TABS
325.0000 mg | ORAL_TABLET | Freq: Every day | ORAL | Status: DC
  Filled 2015-06-04 (×2): qty 1

## 2015-06-04 MED ORDER — ACETAMINOPHEN 325 MG PO TABS: 650.0000 mg | ORAL_TABLET | ORAL | Status: DC | PRN

## 2015-06-04 MED ORDER — ZOLPIDEM TARTRATE 5 MG PO TABS: 5.0000 mg | ORAL_TABLET | Freq: Every evening | ORAL | Status: DC | PRN

## 2015-06-04 MED ORDER — IBUPROFEN 400 MG PO TABS: 600.0000 mg | ORAL_TABLET | Freq: Three times a day (TID) | ORAL | Status: DC | PRN

## 2015-06-04 NOTE — BHH Counselor (Signed)
Received call from Candace at [facility] stating Dr. Guss Bunde has accepted Pt on voluntary basis. Nursing report can be called to [phone]. Notified Dr. Preston Fleeting and Wolfgang Phoenix, RN of acceptance.   Harlin Rain Patsy Baltimore, LPC, [facility], [facility] Triage Specialist [phone]

## 2015-06-04 NOTE — ED Notes (Signed)
Rep from [facility] to inquired about pts condition and update on status. States she would present pt case to the doctor and update ASAP if pt is accepted for placement.

## 2015-06-04 NOTE — ED Notes (Signed)
TTS online at this time 

## 2015-06-04 NOTE — BH Assessment (Signed)
Assessment completed. Consulted Vincent Means, DNP who recommended inpatient treatment. Dr. Preston Fleeting has been informed of the treatment recommendation. Spoke with pt to discuss treatment recommendation. Pt is willing to go to treatment voluntarily. TTS will seek placement.

## 2015-06-04 NOTE — BH Assessment (Signed)
Faxed clinical information to the following facilities for placement:  [facility]   [address] [place], [place], [facility], [facility] Triage Specialist [phone]

## 2015-06-04 NOTE — BH Assessment (Addendum)
Tele Assessment Note   Vincent Robertson is an 64 y.o. male presenting to [facility] due to difficulty breathing. During triage pt informed staff that he has been having thoughts to harm himself. Pt stated "I have been having trouble breathing". "About 2-3 days ago I sat in the yard and imagined what rope and which limb". "It's been building up". "I've seen a therapist because I have trouble controlling my temper". "Everything is going to hell in a hand basket". "I am hurting on the inside".  "I haven't grieved for my wife". "We were together for 21 years and I haven't shed a tear". "I am tired of playing the game, I just want out". Pt reported that he was raised a [address] and he is aware that hurting yourself with get you a free ride to hell. Pt stated "sometimes I'm tempted to override those thoughts". Pt did not report any previous suicide attempts or psychiatric hospitalizations. Pt reported that he has received mental health treatment in the past; however he was unable to provide the names of any of his providers. Pt did not report any self-injurious behaviors. Pt is endorsing multiple depressive symptoms and shared that he is dealing with multiple stressors such as the death of his wife, a failed engagement and not working due to his health issues. Pt denies HI and AVH at this time. When pt was asked about access to weapons or firearms pt stated "if it's all the same to you ma'am I'm not going to answer that question". 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 Pt did not report any physical, sexual or emotional abuse at this time.   Axis I: Major Depression, single episode  Past Medical History:  Past Medical History  Diagnosis Date  . Tobacco use   . Murmur   . Cough   . 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 Tricuspid regurgitation 08/26/2014    Mild-mod; Mild Pulm HTN  48 mmHg  . CAP (community acquired pneumonia)  08/25/2014    With mild sepsis.  . Mild mitral valve prolapse 08/26/2014  . COPD (chronic obstructive pulmonary disease)     History reviewed. No pertinent past surgical history.  Family History: History reviewed. No pertinent family history.  Social History:  reports that he quit smoking about 8 months ago. His smoking use included Cigarettes. He smoked 0.50 packs per day. He does not have any smokeless tobacco history on file. He reports that he does not drink alcohol or use illicit drugs.  Additional Social History:  Alcohol / Drug Use History of alcohol / drug use?: No history of alcohol / drug abuse (Pt reported that he stopped drinking 4 years ago. )  CIWA: CIWA-Ar BP: 140/64 mmHg Pulse Rate: (!) 58 COWS:    PATIENT STRENGTHS: (choose at least two) Average or above average intelligence Communication skills  Allergies: No Known Allergies  Home Medications:  (Not in a hospital admission)  OB/GYN Status:  No LMP for male patient.  General Assessment Data [facility] of [facility]: [facility] TTS Assessment: In system Is this a Tele or Face-to-Face Assessment?: Tele Assessment Is this an Initial Assessment or a Re-assessment for this encounter?: Initial Assessment Marital status: Widowed Living Arrangements: Alone Can pt return to current living arrangement?: Yes Admission Status: Voluntary Is patient capable of signing voluntary admission?: Yes Referral Source: Self/Family/Friend Insurance type: None      Crisis Care Plan  Living Arrangements: Alone Name of Psychiatrist: No provider reported at this time.  Name of Therapist: No provider reported at this time.   Education Status Is patient currently in school?: No Current Grade: NA Highest grade of school patient has completed: 12 Name of school: NA Contact person: NA  Risk to self with the past 6 months Suicidal Ideation: Yes-Currently Present Has patient been a risk to self within the past 6 months prior to  admission? : No Suicidal Intent: No Has patient had any suicidal intent within the past 6 months prior to admission? : No Is patient at risk for suicide?: No Suicidal Plan?: No Has patient had any suicidal plan within the past 6 months prior to admission? : No Access to Means: No What has been your use of drugs/alcohol within the last 12 months?: No alcohol or drug use reported.  Previous Attempts/Gestures: No How many times?: 0 Other Self Harm Risks: No other self harm risk identified at this time  Triggers for Past Attempts: None known Intentional Self Injurious Behavior: None Family Suicide History: No Recent stressful life event(s): Loss (Comment) (Death of wife-2012) Persecutory voices/beliefs?: No Depression: Yes Depression Symptoms: Isolating, Fatigue, Guilt, Loss of interest in usual pleasures, Feeling worthless/self pity, Feeling angry/irritable Substance abuse history and/or treatment for substance abuse?: No  Risk to Others within the past 6 months Homicidal Ideation: No Does patient have any lifetime risk of violence toward others beyond the six months prior to admission? : No Thoughts of Harm to Others: No Current Homicidal Intent: No Current Homicidal Plan: No Access to Homicidal Means: No Identified Victim: NA History of harm to others?: No Assessment of Violence: None Noted Violent Behavior Description: No violent behaviors observed. Pt is calm and cooperative.  Does patient have access to weapons?:  ("If it's all the same to you ma'am; I'm not goingt to answet) Criminal Charges Pending?: No Does patient have a court date: No Is patient on probation?: No  Psychosis Hallucinations: None noted Delusions: None noted  Mental Status Report Appearance/Hygiene: Unable to Assess Eye Contact: Good Motor Activity: Freedom of movement Speech: Logical/coherent Level of Consciousness: Quiet/awake Mood: Pleasant, Euthymic Affect: Appropriate to circumstance Anxiety  Level: Minimal Thought Processes: Relevant, Coherent Judgement: Unimpaired Orientation: Person, Place, Time, Situation Obsessive Compulsive Thoughts/Behaviors: None  Cognitive Functioning Concentration: Decreased Memory: Recent Intact IQ: Average Insight: Good Impulse Control: Good Appetite: Good Weight Loss: 0 Weight Gain: 0 Sleep: Decreased Total Hours of Sleep: 6 Vegetative Symptoms: None  ADLScreening [facility] Assessment Services) Patient's cognitive ability adequate to safely complete daily activities?: Yes Patient able to express need for assistance with ADLs?: Yes Independently performs ADLs?: Yes (appropriate for developmental age)  Prior Inpatient Therapy Prior Inpatient Therapy: No Prior Therapy Dates: NA Prior Therapy Facilty/Provider(s): NA Reason for Treatment: NA  Prior Outpatient Therapy Prior Outpatient Therapy: Yes Prior Therapy Dates: unknown  Prior Therapy Facilty/Provider(s): Pt is unable to provide the name of the provider.  Reason for Treatment: unknown  Does patient have an ACCT team?: No Does patient have Intensive In-House Services?  : No Does patient have [facility] services? : No Does patient have P4CC services?: No  ADL Screening (condition at time of admission) Patient's cognitive ability adequate to safely complete daily activities?: Yes Patient able to express need for assistance with ADLs?: Yes Independently performs ADLs?: Yes (appropriate for developmental age)       Abuse/Neglect Assessment (Assessment to be complete while patient is alone) Physical Abuse: Denies Verbal Abuse: Denies Sexual Abuse:  Denies Exploitation of patient/patient's resources: Denies Self-Neglect: Denies     Merchant [employer] officer (For Healthcare) Does patient have an advance directive?: No Would patient like information on creating an advanced directive?: No - patient declined information    Additional Information 1:1 In Past 12 Months?: No CIRT Risk:  No Elopement Risk: No Does patient have medical clearance?: Yes     Disposition:  Disposition Initial Assessment Completed for this Encounter: Yes Disposition of Patient: Inpatient treatment program Type of inpatient treatment program: Adult  Chaze Hruska S 06/04/2015 1:40 AM

## 2015-06-04 NOTE — ED Notes (Signed)
Resting quietly at this time, no concerns.

## 2015-06-04 NOTE — ED Notes (Signed)
Adela Lank from [facility] called for information on patient. Inquired if patient would have available transportation to pick him up if placed at [facility]. Pt stated he did not have anyone who would be able to pick him up from the facility.

## 2015-06-04 NOTE — ED Notes (Signed)
[facility] here to transport to [facility]

## 2015-06-04 NOTE — ED Notes (Signed)
Pt called nurse to room and stated he would have money available to him to be able to pay for transportation home from facility. Notified [facility].

## 2015-06-14 ENCOUNTER — Emergency Department (HOSPITAL_COMMUNITY): Payer: Self-pay

## 2015-06-14 ENCOUNTER — Emergency Department (HOSPITAL_COMMUNITY)
Admission: EM | Admit: 2015-06-14 | Discharge: 2015-06-15 | Disposition: A | Discharge status: Other Acute Inpatient Hospital | Payer: Self-pay | Attending: Emergency Medicine | Admitting: Emergency Medicine

## 2015-06-14 ENCOUNTER — Encounter (HOSPITAL_COMMUNITY): Payer: Self-pay | Admitting: Emergency Medicine

## 2015-06-14 DIAGNOSIS — R911 Solitary pulmonary nodule: Secondary | ICD-10-CM

## 2015-06-14 DIAGNOSIS — F1310 Sedative, hypnotic or anxiolytic abuse, uncomplicated: Secondary | ICD-10-CM | POA: Insufficient documentation

## 2015-06-14 DIAGNOSIS — Z8679 Personal history of other diseases of the circulatory system: Secondary | ICD-10-CM | POA: Insufficient documentation

## 2015-06-14 DIAGNOSIS — Z7951 Long term (current) use of inhaled steroids: Secondary | ICD-10-CM | POA: Insufficient documentation

## 2015-06-14 DIAGNOSIS — Z79899 Other long term (current) drug therapy: Secondary | ICD-10-CM | POA: Insufficient documentation

## 2015-06-14 DIAGNOSIS — R0789 Other chest pain: Secondary | ICD-10-CM

## 2015-06-14 DIAGNOSIS — F419 Anxiety disorder, unspecified: Secondary | ICD-10-CM | POA: Insufficient documentation

## 2015-06-14 DIAGNOSIS — R45851 Suicidal ideations: Secondary | ICD-10-CM

## 2015-06-14 DIAGNOSIS — Z87891 Personal history of nicotine dependence: Secondary | ICD-10-CM | POA: Insufficient documentation

## 2015-06-14 DIAGNOSIS — Z8701 Personal history of pneumonia (recurrent): Secondary | ICD-10-CM | POA: Insufficient documentation

## 2015-06-14 DIAGNOSIS — J441 Chronic obstructive pulmonary disease with (acute) exacerbation: Secondary | ICD-10-CM | POA: Insufficient documentation

## 2015-06-14 DIAGNOSIS — R011 Cardiac murmur, unspecified: Secondary | ICD-10-CM | POA: Insufficient documentation

## 2015-06-14 LAB — COMPREHENSIVE METABOLIC PANEL
ALK PHOS: 106 U/L (ref 38–126)
ALT: 22 U/L (ref 17–63)
AST: 20 U/L (ref 15–41)
Albumin: 4 g/dL (ref 3.5–5.0)
Anion gap: 7 (ref 5–15)
BUN: 26 mg/dL — ABNORMAL HIGH (ref 6–20)
CO2: 28 mmol/L (ref 22–32)
Calcium: 9 mg/dL (ref 8.9–10.3)
Chloride: 105 mmol/L (ref 101–111)
Creatinine, Ser: 1.36 mg/dL — ABNORMAL HIGH (ref 0.61–1.24)
GFR calc non Af Amer: 54 mL/min — ABNORMAL LOW (ref >60)
GLUCOSE: 80 mg/dL (ref 65–99)
POTASSIUM: 4.3 mmol/L (ref 3.5–5.1)
SODIUM: 140 mmol/L (ref 135–145)
TOTAL PROTEIN: 6.9 g/dL (ref 6.5–8.1)
Total Bilirubin: 0.4 mg/dL (ref 0.3–1.2)

## 2015-06-14 LAB — CBC WITH DIFFERENTIAL/PLATELET
Basophils Absolute: 0.1 10*3/uL (ref 0.0–0.1)
Basophils Relative: 2 % — ABNORMAL HIGH (ref 0–1)
EOS ABS: 0.1 10*3/uL (ref 0.0–0.7)
EOS PCT: 2 % (ref 0–5)
HCT: 40.1 % (ref 39.0–52.0)
Hemoglobin: 13.1 g/dL (ref 13.0–17.0)
LYMPHS ABS: 1.8 10*3/uL (ref 0.7–4.0)
Lymphocytes Relative: 29 % (ref 12–46)
MCH: 30.9 pg (ref 26.0–34.0)
MCHC: 32.7 g/dL (ref 30.0–36.0)
MCV: 94.6 fL (ref 78.0–100.0)
Monocytes Absolute: 0.7 10*3/uL (ref 0.1–1.0)
Monocytes Relative: 11 % (ref 3–12)
Neutro Abs: 3.6 10*3/uL (ref 1.7–7.7)
Neutrophils Relative %: 58 % (ref 43–77)
Platelets: 247 10*3/uL (ref 150–400)
RBC: 4.24 MIL/uL (ref 4.22–5.81)
RDW: 14.1 % (ref 11.5–15.5)
WBC: 6.3 10*3/uL (ref 4.0–10.5)

## 2015-06-14 LAB — RAPID URINE DRUG SCREEN, HOSP PERFORMED
AMPHETAMINES: NOT DETECTED
BARBITURATES: NOT DETECTED
BENZODIAZEPINES: POSITIVE — AB
Cocaine: NOT DETECTED
Opiates: NOT DETECTED
Tetrahydrocannabinol: NOT DETECTED

## 2015-06-14 LAB — TROPONIN I: Troponin I: <0.03 ng/mL (ref <0.031)

## 2015-06-14 MED ORDER — TEMAZEPAM 15 MG PO CAPS
15.0000 mg | ORAL_CAPSULE | Freq: Every day | ORAL | Status: DC
  Administered 2015-06-14: 15 mg via ORAL

## 2015-06-14 MED ORDER — IOHEXOL 350 MG/ML SOLN
100.0000 mL | Freq: Once | INTRAVENOUS | Status: AC | PRN
  Administered 2015-06-14: 100 mL via INTRAVENOUS

## 2015-06-14 MED ORDER — MOMETASONE FURO-FORMOTEROL FUM 100-5 MCG/ACT IN AERO
INHALATION_SPRAY | RESPIRATORY_TRACT | Status: AC
  Filled 2015-06-14: qty 8.8

## 2015-06-14 MED ORDER — FLUOXETINE HCL 20 MG PO CAPS
40.0000 mg | ORAL_CAPSULE | Freq: Every day | ORAL | Status: DC
  Administered 2015-06-14 (×2): 40 mg via ORAL

## 2015-06-14 MED ORDER — FLUOXETINE HCL 20 MG PO CAPS
ORAL_CAPSULE | ORAL | Status: AC
  Administered 2015-06-14: 40 mg via ORAL
  Filled 2015-06-14: qty 2

## 2015-06-14 MED ORDER — ALBUTEROL SULFATE HFA 108 (90 BASE) MCG/ACT IN AERS
2.0000 | INHALATION_SPRAY | Freq: Three times a day (TID) | RESPIRATORY_TRACT | Status: DC
  Administered 2015-06-14: 2 via RESPIRATORY_TRACT
  Filled 2015-06-14: qty 6.7

## 2015-06-14 MED ORDER — FERROUS SULFATE 325 (65 FE) MG PO TABS
325.0000 mg | ORAL_TABLET | Freq: Every day | ORAL | Status: DC
  Filled 2015-06-14 (×2): qty 1

## 2015-06-14 MED ORDER — MOMETASONE FURO-FORMOTEROL FUM 100-5 MCG/ACT IN AERO
2.0000 | INHALATION_SPRAY | Freq: Two times a day (BID) | RESPIRATORY_TRACT | Status: DC
  Administered 2015-06-14: 2 via RESPIRATORY_TRACT
  Filled 2015-06-14: qty 8.8

## 2015-06-14 MED ORDER — OXYMETAZOLINE HCL 0.05 % NA SOLN: 1.0000 | Freq: Two times a day (BID) | NASAL | Status: DC | PRN

## 2015-06-14 NOTE — BH Assessment (Addendum)
Tele Assessment Note   Vincent Robertson is an 64 y.o. male.  -Clinician spoke with Dr. Manus Gunning regarding need for TTS.  Patient has been discharged from [facility] on 06/10/15 after being there for about 5 days.  He is still having suicidal thoughts with plan to hang self or get hit by a truck.  Patient said that he was discharged from [facility] on Saturday (07/02).  He said that he had thought that he was okay but he realizes now that the depression and anxiety are the same as when he was admitted.  He has been thinking about getting hit by a truck or hanging himself.  No previous attempts.  When asked if he would be okay to go home he says, "I don't think that would be a good idea."  He denies any HI or A/V hallucinations.  Patient has depression and anxiety.  He talks about being estranged from his sons and his ex-wife.  Most of them are in [place] and patient says that they do not return calls and he has been in contact with them.  He says that this weighs on him also.  Patient says that his 2nd wife died four years ago.  Patient is worried about the relationship with current girlfriend.  -Clinician discussed patient care with Donell Sievert, PA.  He said that since patient was just discharged from [facility] that he should be referred there or to other facilities.  TTS to seek placement.  Clinician informed Dr. Manus Gunning of disposition.  Axis I: Major Depression, Recurrent severe Axis II: Deferred Axis III:  Past Medical History  Diagnosis Date  . Tobacco use   . Murmur   . Cough   . 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 Tricuspid regurgitation 08/26/2014    Mild-mod; Mild Pulm HTN  48 mmHg  . CAP (community acquired pneumonia) 08/25/2014    With mild sepsis.  . Mild mitral valve prolapse 08/26/2014  . COPD (chronic obstructive pulmonary disease)    Axis IV: housing problems, other psychosocial or environmental problems and problems with primary support group Axis V:  31-40 impairment in reality testing  Past Medical History:  Past Medical History  Diagnosis Date  . Tobacco use   . Murmur   . Cough   . 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 Tricuspid regurgitation 08/26/2014    Mild-mod; Mild Pulm HTN  48 mmHg  . CAP (community acquired pneumonia) 08/25/2014    With mild sepsis.  . Mild mitral valve prolapse 08/26/2014  . COPD (chronic obstructive pulmonary disease)     History reviewed. No pertinent past surgical history.  Family History: No family history on file.  Social History:  reports that he quit smoking about 9 months ago. His smoking use included Cigarettes. He smoked 0.50 packs per day. He does not have any smokeless tobacco history on file. He reports that he does not drink alcohol or use illicit drugs.  Additional Social History:  Alcohol / Drug Use Prescriptions: Prozac 40mg ; there are some other medications but patient is unsure of what they are. Over the Counter: Ibuprophen History of alcohol / drug use?: No history of alcohol / drug abuse  CIWA: CIWA-Ar BP: 159/76 mmHg Pulse Rate: 79 COWS:    PATIENT STRENGTHS: (choose at least two) Average or above average intelligence Capable of independent living Communication skills Motivation for treatment/growth  Allergies: No Known Allergies  Home Medications:  (Not in a hospital admission)  OB/GYN Status:  No LMP  for male patient.  General Assessment Data Location of Assessment: [facility] TTS Assessment: In system Is this a Tele or Face-to-Face Assessment?: Tele Assessment Is this an Initial Assessment or a Re-assessment for this encounter?: Initial Assessment Marital status: Widowed Is patient pregnant?: No Pregnancy Status: No Living Arrangements: Spouse/significant other (Pt lives currently with girlfriend) Can pt return to current living arrangement?: Yes Admission Status: Voluntary Is patient capable of signing voluntary admission?: Yes Referral Source:  Self/Family/Friend Insurance type: MCD?     Crisis Care Plan Living Arrangements: Spouse/significant other (Pt lives currently with girlfriend) Name of Psychiatrist: [facility] Name of Therapist: Lyla Sonarrie at [facility]  Education Status Is patient currently in school?: No Highest grade of school patient has completed: 12th grade  Risk to self with the past 6 months Suicidal Ideation: Yes-Currently Present Has patient been a risk to self within the past 6 months prior to admission? : Yes Suicidal Intent: Yes-Currently Present Has patient had any suicidal intent within the past 6 months prior to admission? : Yes Is patient at risk for suicide?: Yes Suicidal Plan?: Yes-Currently Present Has patient had any suicidal plan within the past 6 months prior to admission? : Yes Specify Current Suicidal Plan: Hang self or get hit by a truck Access to Means: Yes Specify Access to Suicidal Means: Rope or traffic What has been your use of drugs/alcohol within the last 12 months?: None Previous Attempts/Gestures: No How many times?: 0 Other Self Harm Risks: None Triggers for Past Attempts: None known Intentional Self Injurious Behavior: None Family Suicide History: No Recent stressful life event(s): Turmoil (Comment) Persecutory voices/beliefs?: No Depression: Yes Depression Symptoms: Despondent, Insomnia, Guilt, Loss of interest in usual pleasures, Feeling worthless/self pity Substance abuse history and/or treatment for substance abuse?: No Suicide prevention information given to non-admitted patients: Not applicable  Risk to Others within the past 6 months Homicidal Ideation: No Does patient have any lifetime risk of violence toward others beyond the six months prior to admission? : No Thoughts of Harm to Others: No Current Homicidal Intent: No Current Homicidal Plan: No Access to Homicidal Means: No Identified Victim: No one History of harm to others?: No Assessment of  Violence: None Noted Violent Behavior Description: None Does patient have access to weapons?: Yes (Comment) (Pt has access to knifes.) Criminal Charges Pending?: No Does patient have a court date: No Is patient on probation?: No  Psychosis Hallucinations: None noted Delusions: None noted  Mental Status Report Appearance/Hygiene: Unremarkable, In scrubs Eye Contact: Good Motor Activity: Freedom of movement, Unremarkable Speech: Logical/coherent Level of Consciousness: Alert Mood: Depressed, Anxious, Sad, Despair, Helpless Affect: Anxious, Depressed, Sad Anxiety Level: Moderate Thought Processes: Coherent, Relevant Judgement: Unimpaired Orientation: Person, Place, Time, Situation Obsessive Compulsive Thoughts/Behaviors: None  Cognitive Functioning Concentration: Decreased Memory: Recent Impaired, Remote Intact IQ: Average Insight: Good Impulse Control: Fair Appetite: Fair Weight Loss: 0 Weight Gain: 0 Sleep: Decreased Total Hours of Sleep:  (<6H/D) Vegetative Symptoms: None  ADLScreening [facility] Assessment Services) Patient's cognitive ability adequate to safely complete daily activities?: Yes Patient able to express need for assistance with ADLs?: Yes Independently performs ADLs?: Yes (appropriate for developmental age)  Prior Inpatient Therapy Prior Inpatient Therapy: Yes Prior Therapy Dates: 06-27/16 to 06-10-15 [facility] Reason for Treatment: SI  Prior Outpatient Therapy Prior Outpatient Therapy: Yes Prior Therapy Dates: February '16 to current Prior Therapy Facilty/Provider(s): [facility] Reason for Treatment: Med manatgement Does patient have an ACCT team?: No Does patient  have Intensive In-House Services?  : No Does patient have [facility] services? 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.) Does the patient have difficulty seeing, even when wearing glasses/contacts?: No (Does wear glasses.) Does the patient have difficulty concentrating, remembering, or making decisions?: Yes Patient able to express need for assistance with ADLs?: Yes Does the patient have difficulty dressing or bathing?: No Independently performs ADLs?: Yes (appropriate for developmental age) Does the patient have difficulty walking or climbing stairs?: No Weakness of Legs: None Weakness of Arms/Hands: None       Abuse/Neglect Assessment (Assessment to be complete while patient is alone) Physical Abuse: Denies Verbal Abuse: Denies Sexual Abuse: Denies Exploitation of patient/patient's resources: Denies Self-Neglect: Denies     Merchant [employer] officer (For Healthcare) Does patient have an advance directive?: No Would patient like information on creating an advanced directive?: No - patient declined information    Additional Information 1:1 In Past 12 Months?: No CIRT Risk: No Elopement Risk: No Does patient have medical clearance?: Yes     Disposition:  Disposition Initial Assessment Completed for this Encounter: Yes Disposition of Patient: Inpatient treatment program, Referred to Type of inpatient treatment program: Adult Patient referred to:  (To be reviewed wtih PA)  Beatriz Stallion Ray 06/14/2015 9:43 PM

## 2015-06-14 NOTE — ED Notes (Signed)
Pt states he lives with is girlfriend, but he has not support system. Pt having thoughts of hanging himself with a rope, using something with a a point, or hoping a semi truck would run over his van. Pt has been dealing with these thought for the last 2 day, gotten worse in the last 24 hrs.

## 2015-06-14 NOTE — ED Provider Notes (Signed)
CSN: [id number]     Arrival date & time 06/14/15  1445 History   First MD Initiated Contact with Patient 06/14/15 1459     Chief Complaint  Patient presents with  . V70.1     (Consider location/radiation/quality/duration/timing/severity/associated sxs/prior Treatment) HPI Comments: Patient complains of suicidal thoughts for the past several days. He was recently hospitalized for the same. He's had thoughts of hanging himself with a rope as well as hoping to the run over by a truck. He states he was recently hospitalized in his Prozac was increased. Denies drug or alcohol abuse. He also complains of pain to his left rib where his been hurting for the past one week. It is point tenderness worse with palpation. He feels his COPD is at baseline denies any difficulty breathing now or cough. He no longer smokes. He denies any exertional chest pain or diaphoresis.  The history is provided by the patient.    Past Medical History  Diagnosis Date  . Tobacco use   . Murmur   . Cough   . 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 Tricuspid regurgitation 08/26/2014    Mild-mod; Mild Pulm HTN  48 mmHg  . CAP (community acquired pneumonia) 08/25/2014    With mild sepsis.  . Mild mitral valve prolapse 08/26/2014  . COPD (chronic obstructive pulmonary disease)    History reviewed. No pertinent past surgical history. No family history on file. History  Substance Use Topics  . Smoking status: Former Smoker -- 0.50 packs/day    Types: Cigarettes    Quit date: 09/08/2014  . Smokeless tobacco: Not on file  . Alcohol Use: No    Review of Systems  Constitutional: Negative for fever, activity change and appetite change.  Respiratory: Positive for cough and shortness of breath. Negative for chest tightness.   Cardiovascular: Positive for chest pain.  Gastrointestinal: Negative for nausea, vomiting and abdominal pain.  Genitourinary: Negative for dysuria and hematuria.  Musculoskeletal: Negative for  myalgias, back pain and arthralgias.  Skin: Negative for rash.  Neurological: Negative for dizziness, facial asymmetry, weakness and headaches.  Psychiatric/Behavioral: Positive for suicidal ideas, self-injury, dysphoric mood and decreased concentration. The patient is nervous/anxious.   A complete 10 system review of systems was obtained and all systems are negative except as noted in the HPI and PMH.      Allergies  Review of patient's allergies indicates no known allergies.  Home Medications   Prior to Admission medications   Medication Sig Start Date End Date Taking? Authorizing Provider  albuterol (PROVENTIL HFA;VENTOLIN HFA) 108 (90 BASE) MCG/ACT inhaler Inhale 2 puffs into the lungs 3 (three) times daily. 08/27/14  Yes Elliot Cousin, MD  albuterol (PROVENTIL) (2.5 MG/3ML) 0.083% nebulizer solution Take 3 mLs (2.5 mg total) by nebulization every 6 (six) hours as needed for wheezing. 10/01/14  Yes Erick Blinks, MD  ferrous sulfate 325 (65 FE) MG tablet Take 1 tablet (325 mg total) by mouth 2 (two) times daily with a meal. Patient taking differently: Take 325 mg by mouth daily.  08/27/14  Yes Elliot Cousin, MD  FLUoxetine (PROZAC) 20 MG capsule Take 40 mg by mouth daily.    Yes Historical Provider, MD  ibuprofen (ADVIL,MOTRIN) 200 MG tablet Take 400-800 mg by mouth every 8 (eight) hours as needed for mild pain or moderate pain.    Yes Historical Provider, MD  mometasone-formoterol (DULERA) 100-5 MCG/ACT AERO Inhale 2 puffs into the lungs 2 (two) times daily.   Yes Historical Provider, MD  oxymetazoline (AFRIN) 0.05 % nasal spray Place 1 spray into both nostrils 2 (two) times daily as needed for congestion. 08/27/14  Yes Elliot Cousinenise Fisher, MD  temazepam (RESTORIL) 15 MG capsule Take 15 mg by mouth at bedtime.   Yes Historical Provider, MD   BP 159/76 mmHg  Pulse 79  Temp(Src) 98.5 F (36.9 C) (Oral)  Resp 18  Ht 5\' 4"  (1.626 m)  Wt 105 lb (47.628 kg)  BMI 18.01 kg/m2  SpO2  97% Physical Exam  Constitutional: He is oriented to person, place, and time. He appears well-developed and well-nourished. No distress.  HENT:  Head: Normocephalic and atraumatic.  Mouth/Throat: Oropharynx is clear and moist. No oropharyngeal exudate.  Eyes: Conjunctivae and EOM are normal. Pupils are equal, round, and reactive to light.  Neck: Normal range of motion. Neck supple.  No meningismus.  Cardiovascular: Normal rate, regular rhythm, normal heart sounds and intact distal pulses.   No murmur heard. Pulmonary/Chest: Effort normal and breath sounds normal. No respiratory distress. He has no wheezes. He exhibits tenderness.  Decreased breath sounds Point tenderness to L chest wall  Abdominal: Soft. There is no tenderness. There is no rebound and no guarding.  Musculoskeletal: Normal range of motion. He exhibits no edema or tenderness.  Neurological: He is alert and oriented to person, place, and time. No cranial nerve deficit. He exhibits normal muscle tone. Coordination normal.  No ataxia on finger to nose bilaterally. No pronator drift. 5/5 strength throughout. CN 2-12 intact. Negative Romberg. Equal grip strength. Sensation intact. Gait is normal.   Skin: Skin is warm.  Psychiatric: He has a normal mood and affect. His behavior is normal.  Nursing note and vitals reviewed.   ED Course  Procedures (including critical care time) Labs Review Labs Reviewed  CBC WITH DIFFERENTIAL/PLATELET - Abnormal; Notable for the following:    Basophils Relative 2 (*)    All other components within normal limits  COMPREHENSIVE METABOLIC PANEL - Abnormal; Notable for the following:    BUN 26 (*)    Creatinine, Ser 1.36 (*)    GFR calc non Af Amer 54 (*)    All other components within normal limits  URINE RAPID DRUG SCREEN, HOSP PERFORMED - Abnormal; Notable for the following:    Benzodiazepines POSITIVE (*)    All other components within normal limits  TROPONIN I    Imaging Review Dg  Chest 2 View  06/14/2015   CLINICAL DATA:  LEFT side chest pain for 1 week, chronic shortness of breath, history COPD, former smoker  EXAM: CHEST  2 VIEW  COMPARISON:  06/03/2015  FINDINGS: Normal heart size, mediastinal contours and pulmonary vascularity.  Scarring at the medial aspect of RIGHT middle lobe unchanged.  Emphysematous and bronchitic changes consistent with COPD.  No acute infiltrate, pleural effusion or pneumothorax.  Question new subtle LEFT upper lobe nodular density 10 mm diameter.  No acute osseous findings.  IMPRESSION: COPD changes with RIGHT middle lobe scarring.  Question subtle new LEFT upper lobe nodule 10 mm diameter; CT chest recommended to exclude pulmonary nodule.   Electronically Signed   By: Ulyses SouthwardMark  Boles M.D.   On: 06/14/2015 16:30   Ct Angio Chest Pe W/cm &/or Wo Cm  06/14/2015   CLINICAL DATA:  64 year old male with abnormal chest x-ray and lung nodule.  EXAM: CT ANGIOGRAPHY CHEST WITH CONTRAST  TECHNIQUE: Multidetector CT imaging of the chest was performed using the standard protocol during bolus administration of intravenous contrast. Multiplanar CT image reconstructions  and MIPs were obtained to evaluate the vascular anatomy.  CONTRAST:  OMNIPAQUE IOHEXOL 350 MG/ML SOLN  COMPARISON:  Chest radiograph dated from 06/14/2015.  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 There is a 7 mm right upper lobe pulmonary nodule (series 8, image 122). There are periapical scarring. There is partial consolidation of the right middle lobe with air bronchograms compatible with atelectasis versus pneumonia. There is no pleural effusion. No pneumothorax. The central airways are patent.  The thoracic aorta appears unremarkable. No aneurysm or dissection. No CT evidence of pulmonary embolism. No adenopathy. Top-normal cardiac size. No pericardial effusion. The mild degenerative changes of the spine.  Review of the MIP images confirms the above findings.  IMPRESSION: No CT evidence of pulmonary  embolism.  Emphysema with partial right middle lobe atelectasis versus pneumonia.  A 7 mm right upper lobe pulmonary nodule. If the patient is at high risk for bronchogenic carcinoma, follow-up chest CT at 3-63months is recommended. If the patient is at low risk for bronchogenic carcinoma, follow-up chest CT at 6-12 months is recommended. This recommendation follows the consensus statement: Guidelines for Management of Small Pulmonary Nodules Detected on CT Scans: A Statement from the Fleischner Society as published in Radiology 2005; 237:395-400.   Electronically Signed   By: Elgie Collard M.D.   On: 06/14/2015 18:06     EKG Interpretation   Date/Time:  Wednesday June 14 2015 16:43:38 EDT Ventricular Rate:  64 PR Interval:  132 QRS Duration: 94 QT Interval:  424 QTC Calculation: 437 R Axis:   82 Text Interpretation:  Normal sinus rhythm Possible Left atrial enlargement  Borderline ECG No significant change was found Confirmed by Manus Gunning  MD,  Jasemine Nawaz [id number]) on 06/14/2015 5:11:50 PM      MDM   Final diagnoses:  None   suicidal thoughts with plan to hang self. Breathing is at baseline. Chest pain is reproducible and constant for the past one week. Atypical for ACS.  EKG is unchanged. Chest x-ray shows possible new lung nodule. CT confirms this. Patient informed that he needs to have repeat CT scan in 6 months due to his risk for carcinoma.  He is medically clear for psychiatric evaluation.   Glynn Octave, MD 06/14/15 [phone]

## 2015-06-14 NOTE — Progress Notes (Signed)
Patient has been referred for IP treatment/followed up at: [facility] - per Delorise ShinerGrace, fax referral. D/c on 7/06.  [facility] - left voicemail. [facility] - left voicemail. [facility] - per intake, fax for waitlist. [facility] - per Morrie SheldonAshley, fax it for review. [facility] male bed, adolescent male/male, adult regular, and adult 2518-64yo. [facility] - left voicemail. [facility] - per Mervyn GayLora, d/c tomorrow, fax it.  [facility]   CSW will continue to seek placement.  Melbourne Abtsatia Cayde Held, LCSWA Disposition staff 06/14/2015 6:00 PM

## 2015-06-15 NOTE — ED Provider Notes (Signed)
Birder RobsonLequesta, TSS, states pt accepted at [facility] by Dr Althea Grimmerosado  PT is awake and cooperative, he has flat affect, he is willing for the admission.   Devoria AlbeIva Raven Harmes, MD, Concha PyoFACEP      Ceniya Fowers, MD 06/15/15 0630

## 2015-06-15 NOTE — ED Notes (Signed)
Pt calm and cooperative, having breakfast prior to transport. States his girlfriend, Eunice BlaseDebbie, picked up all his belongings yesterday. [facility] at bedside for transport.

## 2015-06-15 NOTE — ED Notes (Signed)
Patient has been accepted to [facility] in [place], [place], [facility], patient needs to enter through ED and be registered, [facility] will pick up patient around 730am.

## 2015-06-15 NOTE — BH Assessment (Addendum)
Spoke with Misty StanleyStacey at [facility] who reported that pt has been accepted to the services of Dr. Althea Grimmerosado. Nurse to nurse report can be called to [phone]. Dr. Devoria AlbeIva Knapp has been informed of the disposition. Spoke with pt's nurse Bronson IngYvette, RN to discuss transportation options back [facility]. She relayed that pt thinks he would have transportation back home.

## 2015-09-11 ENCOUNTER — Encounter (HOSPITAL_COMMUNITY): Payer: Self-pay | Admitting: Emergency Medicine

## 2015-09-11 ENCOUNTER — Emergency Department (HOSPITAL_COMMUNITY)
Admission: EM | Admit: 2015-09-11 | Discharge: 2015-09-11 | Disposition: A | Discharge status: Home / Self Care | Payer: Self-pay | Attending: Emergency Medicine | Admitting: Emergency Medicine

## 2015-09-11 DIAGNOSIS — Z79899 Other long term (current) drug therapy: Secondary | ICD-10-CM | POA: Insufficient documentation

## 2015-09-11 DIAGNOSIS — R011 Cardiac murmur, unspecified: Secondary | ICD-10-CM | POA: Insufficient documentation

## 2015-09-11 DIAGNOSIS — Y9289 Other specified places as the place of occurrence of the external cause: Secondary | ICD-10-CM | POA: Insufficient documentation

## 2015-09-11 DIAGNOSIS — Z720 Tobacco use: Secondary | ICD-10-CM | POA: Insufficient documentation

## 2015-09-11 DIAGNOSIS — M5441 Lumbago with sciatica, right side: Secondary | ICD-10-CM

## 2015-09-11 DIAGNOSIS — J449 Chronic obstructive pulmonary disease, unspecified: Secondary | ICD-10-CM | POA: Insufficient documentation

## 2015-09-11 DIAGNOSIS — Y9389 Activity, other specified: Secondary | ICD-10-CM | POA: Insufficient documentation

## 2015-09-11 DIAGNOSIS — Z8679 Personal history of other diseases of the circulatory system: Secondary | ICD-10-CM | POA: Insufficient documentation

## 2015-09-11 DIAGNOSIS — Y998 Other external cause status: Secondary | ICD-10-CM | POA: Insufficient documentation

## 2015-09-11 DIAGNOSIS — F419 Anxiety disorder, unspecified: Secondary | ICD-10-CM | POA: Insufficient documentation

## 2015-09-11 DIAGNOSIS — W501XXA Accidental kick by another person, initial encounter: Secondary | ICD-10-CM | POA: Insufficient documentation

## 2015-09-11 DIAGNOSIS — Z8701 Personal history of pneumonia (recurrent): Secondary | ICD-10-CM | POA: Insufficient documentation

## 2015-09-11 DIAGNOSIS — Z7951 Long term (current) use of inhaled steroids: Secondary | ICD-10-CM | POA: Insufficient documentation

## 2015-09-11 MED ORDER — KETOROLAC TROMETHAMINE 60 MG/2ML IM SOLN
60.0000 mg | Freq: Once | INTRAMUSCULAR | Status: AC
  Administered 2015-09-11: 60 mg via INTRAMUSCULAR
  Filled 2015-09-11: qty 2

## 2015-09-11 NOTE — ED Notes (Signed)
Pt made aware to return if symptoms worsen or if any life threatening symptoms occur.   

## 2015-09-11 NOTE — Discharge Instructions (Signed)
Back Pain, Adult °Back pain is very common. The pain often gets better over time. The cause of back pain is usually not dangerous. Most people can learn to manage their back pain on their own.  °HOME CARE  °· Stay active. Start with short walks on flat ground if you can. Try to walk farther each day. °· Do not sit, drive, or stand in one place for more than 30 minutes. Do not stay in bed. °· Do not avoid exercise or work. Activity can help your back heal faster. °· Be careful when you bend or lift an object. Bend at your knees, keep the object close to you, and do not twist. °· Sleep on a firm mattress. Lie on your side, and bend your knees. If you lie on your back, put a pillow under your knees. °· Only take medicines as told by your doctor. °· Put ice on the injured area. °· Put ice in a plastic bag. °· Place a towel between your skin and the bag. °· Leave the ice on for 15-20 minutes, 03-04 times a day for the first 2 to 3 days. After that, you can switch between ice and heat packs. °· Ask your doctor about back exercises or massage. °· Avoid feeling anxious or stressed. Find good ways to deal with stress, such as exercise. °GET HELP RIGHT AWAY IF:  °· Your pain does not go away with rest or medicine. °· Your pain does not go away in 1 week. °· You have new problems. °· You do not feel well. °· The pain spreads into your legs. °· You cannot control when you poop (bowel movement) or pee (urinate). °· Your arms or legs feel weak or lose feeling (numbness). °· You feel sick to your stomach (nauseous) or throw up (vomit). °· You have belly (abdominal) pain. °· You feel like you may pass out (faint). °MAKE SURE YOU:  °· Understand these instructions. °· Will watch your condition. °· Will get help right away if you are not doing well or get worse. °Document Released: 05/13/2008 Document Revised: 02/17/2012 Document Reviewed: 03/29/2014 °ExitCare® Patient Information ©2015 [employer]. This information is not intended  to replace advice given to you by your health care provider. Make sure you discuss any questions you have with your health care provider. ° °Back Exercises °Back exercises help treat and prevent back injuries. The goal of back exercises is to increase the strength of your abdominal and back muscles and the flexibility of your back. These exercises should be started when you no longer have back pain. Back exercises include: °· Pelvic Tilt. Lie on your back with your knees bent. Tilt your pelvis until the lower part of your back is against the floor. Hold this position 5 to 10 sec and repeat 5 to 10 times. °· Knee to Chest. Pull first 1 knee up against your chest and hold for 20 to 30 seconds, repeat this with the other knee, and then both knees. This may be done with the other leg straight or bent, whichever feels better. °· Sit-Ups or Curl-Ups. Bend your knees 90 degrees. Start with tilting your pelvis, and do a partial, slow sit-up, lifting your trunk only 30 to 45 degrees off the floor. Take at least 2 to 3 seconds for each sit-up. Do not do sit-ups with your knees out straight. If partial sit-ups are difficult, simply do the above but with only tightening your abdominal muscles and holding it as directed. °· Hip-Lift.   Lie on your back with your knees flexed 90 degrees. Push down with your feet and shoulders as you raise your hips a couple inches off the floor; hold for 10 seconds, repeat 5 to 10 times. °· Back arches. Lie on your stomach, propping yourself up on bent elbows. Slowly press on your hands, causing an arch in your low back. Repeat 3 to 5 times. Any initial stiffness and discomfort should lessen with repetition over time. °· Shoulder-Lifts. Lie face down with arms beside your body. Keep hips and torso pressed to floor as you slowly lift your head and shoulders off the floor. °Do not overdo your exercises, especially in the beginning. Exercises may cause you some mild back discomfort which lasts for a  few minutes; however, if the pain is more severe, or lasts for more than 15 minutes, do not continue exercises until you see your caregiver. Improvement with exercise therapy for back problems is slow.  °See your caregivers for assistance with developing a proper back exercise program. °Document Released: 01/02/2005 Document Revised: 02/17/2012 Document Reviewed: 09/26/2011 °ExitCare® Patient Information ©2015 [employer]. This information is not intended to replace advice given to you by your health care provider. Make sure you discuss any questions you have with your health care provider. °Sciatica °Sciatica is pain, weakness, numbness, or tingling along the path of the sciatic nerve. The nerve starts in the lower back and runs down the back of each leg. The nerve controls the muscles in the lower leg and in the back of the knee, while also providing sensation to the back of the thigh, lower leg, and the sole of your foot. Sciatica is a symptom of another medical condition. For instance, nerve damage or certain conditions, such as a herniated disk or bone spur on the spine, pinch or put pressure on the sciatic nerve. This causes the pain, weakness, or other sensations normally associated with sciatica. Generally, sciatica only affects one side of the body. °CAUSES  °· Herniated or slipped disc. °· Degenerative disk disease. °· A pain disorder involving the narrow muscle in the buttocks (piriformis syndrome). °· Pelvic injury or fracture. °· Pregnancy. °· Tumor (rare). °SYMPTOMS  °Symptoms can vary from mild to very severe. The symptoms usually travel from the low back to the buttocks and down the back of the leg. Symptoms can include: °· Mild tingling or dull aches in the lower back, leg, or hip. °· Numbness in the back of the calf or sole of the foot. °· Burning sensations in the lower back, leg, or hip. °· Sharp pains in the lower back, leg, or hip. °· Leg weakness. °· Severe back pain inhibiting  movement. °These symptoms may get worse with coughing, sneezing, laughing, or prolonged sitting or standing. Also, being overweight may worsen symptoms. °DIAGNOSIS  °Your caregiver will perform a physical exam to look for common symptoms of sciatica. He or she may ask you to do certain movements or activities that would trigger sciatic nerve pain. Other tests may be performed to find the cause of the sciatica. These may include: °· Blood tests. °· X-rays. °· Imaging tests, such as an MRI or CT scan. °TREATMENT  °Treatment is directed at the cause of the sciatic pain. Sometimes, treatment is not necessary and the pain and discomfort goes away on its own. If treatment is needed, your caregiver may suggest: °· Over-the-counter medicines to relieve pain. °· Prescription medicines, such as anti-inflammatory medicine, muscle relaxants, or narcotics. °· Applying heat or ice   to the painful area. °· Steroid injections to lessen pain, irritation, and inflammation around the nerve. °· Reducing activity during periods of pain. °· Exercising and stretching to strengthen your abdomen and improve flexibility of your spine. Your caregiver may suggest losing weight if the extra weight makes the back pain worse. °· Physical therapy. °· Surgery to eliminate what is pressing or pinching the nerve, such as a bone spur or part of a herniated disk. °HOME CARE INSTRUCTIONS  °· Only take over-the-counter or prescription medicines for pain or discomfort as directed by your caregiver. °· Apply ice to the affected area for 20 minutes, 3-4 times a day for the first 48-72 hours. Then try heat in the same way. °· Exercise, stretch, or perform your usual activities if these do not aggravate your pain. °· Attend physical therapy sessions as directed by your caregiver. °· Keep all follow-up appointments as directed by your caregiver. °· Do not wear high heels or shoes that do not provide proper support. °· Check your mattress to see if it is too  soft. A firm mattress may lessen your pain and discomfort. °SEEK IMMEDIATE MEDICAL CARE IF:  °· You lose control of your bowel or bladder (incontinence). °· You have increasing weakness in the lower back, pelvis, buttocks, or legs. °· You have redness or swelling of your back. °· You have a burning sensation when you urinate. °· You have pain that gets worse when you lie down or awakens you at night. °· Your pain is worse than you have experienced in the past. °· Your pain is lasting longer than 4 weeks. °· You are suddenly losing weight without reason. °MAKE SURE YOU: °· Understand these instructions. °· Will watch your condition. °· Will get help right away if you are not doing well or get worse. °Document Released: 11/19/2001 Document Revised: 05/26/2012 Document Reviewed: 04/05/2012 °ExitCare® Patient Information ©2015 [employer]. This information is not intended to replace advice given to you by your health care provider. Make sure you discuss any questions you have with your health care provider. ° °

## 2015-09-11 NOTE — ED Notes (Signed)
Patient complaining of right sided back pain since yesterday after moving furniture. States pain radiates down right leg.
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
## 2015-10-09 ENCOUNTER — Emergency Department (HOSPITAL_COMMUNITY): Payer: Self-pay

## 2015-10-09 ENCOUNTER — Emergency Department (HOSPITAL_COMMUNITY)
Admission: EM | Admit: 2015-10-09 | Discharge: 2015-10-09 | Disposition: A | Discharge status: Home / Self Care | Payer: Self-pay | Attending: Emergency Medicine | Admitting: Emergency Medicine

## 2015-10-09 ENCOUNTER — Encounter (HOSPITAL_COMMUNITY): Payer: Self-pay | Admitting: Emergency Medicine

## 2015-10-09 DIAGNOSIS — R42 Dizziness and giddiness: Secondary | ICD-10-CM | POA: Insufficient documentation

## 2015-10-09 DIAGNOSIS — Z720 Tobacco use: Secondary | ICD-10-CM | POA: Insufficient documentation

## 2015-10-09 DIAGNOSIS — M545 Low back pain: Secondary | ICD-10-CM | POA: Insufficient documentation

## 2015-10-09 DIAGNOSIS — R011 Cardiac murmur, unspecified: Secondary | ICD-10-CM | POA: Insufficient documentation

## 2015-10-09 DIAGNOSIS — Z79899 Other long term (current) drug therapy: Secondary | ICD-10-CM | POA: Insufficient documentation

## 2015-10-09 DIAGNOSIS — Z8701 Personal history of pneumonia (recurrent): Secondary | ICD-10-CM | POA: Insufficient documentation

## 2015-10-09 DIAGNOSIS — Z7951 Long term (current) use of inhaled steroids: Secondary | ICD-10-CM | POA: Insufficient documentation

## 2015-10-09 DIAGNOSIS — Z8679 Personal history of other diseases of the circulatory system: Secondary | ICD-10-CM | POA: Insufficient documentation

## 2015-10-09 DIAGNOSIS — J441 Chronic obstructive pulmonary disease with (acute) exacerbation: Secondary | ICD-10-CM

## 2015-10-09 LAB — BASIC METABOLIC PANEL
Anion gap: 7 (ref 5–15)
BUN: 21 mg/dL — ABNORMAL HIGH (ref 6–20)
CHLORIDE: 101 mmol/L (ref 101–111)
CO2: 27 mmol/L (ref 22–32)
CREATININE: 1.47 mg/dL — AB (ref 0.61–1.24)
Calcium: 8.8 mg/dL — ABNORMAL LOW (ref 8.9–10.3)
GFR calc non Af Amer: 49 mL/min — ABNORMAL LOW (ref >60)
GFR, EST AFRICAN AMERICAN: 57 mL/min — AB (ref >60)
Glucose, Bld: 115 mg/dL — ABNORMAL HIGH (ref 65–99)
POTASSIUM: 4 mmol/L (ref 3.5–5.1)
Sodium: 135 mmol/L (ref 135–145)

## 2015-10-09 LAB — URINE MICROSCOPIC-ADD ON

## 2015-10-09 LAB — URINALYSIS, ROUTINE W REFLEX MICROSCOPIC
Bilirubin Urine: NEGATIVE
Glucose, UA: NEGATIVE mg/dL
Ketones, ur: NEGATIVE mg/dL
LEUKOCYTES UA: NEGATIVE
NITRITE: NEGATIVE
PROTEIN: NEGATIVE mg/dL
SPECIFIC GRAVITY, URINE: 1.015 (ref 1.005–1.030)
UROBILINOGEN UA: 0.2 mg/dL (ref 0.0–1.0)
pH: 7 (ref 5.0–8.0)

## 2015-10-09 LAB — CBC
HEMATOCRIT: 40.3 % (ref 39.0–52.0)
HEMOGLOBIN: 13.5 g/dL (ref 13.0–17.0)
MCH: 31.3 pg (ref 26.0–34.0)
MCHC: 33.5 g/dL (ref 30.0–36.0)
MCV: 93.5 fL (ref 78.0–100.0)
Platelets: 189 10*3/uL (ref 150–400)
RBC: 4.31 MIL/uL (ref 4.22–5.81)
RDW: 13.9 % (ref 11.5–15.5)
WBC: 5.2 10*3/uL (ref 4.0–10.5)

## 2015-10-09 MED ORDER — PREDNISONE 10 MG PO TABS: ORAL_TABLET | ORAL | Status: DC

## 2015-10-09 MED ORDER — PREDNISONE 50 MG PO TABS
60.0000 mg | ORAL_TABLET | Freq: Once | ORAL | Status: AC
  Administered 2015-10-09: 60 mg via ORAL
  Filled 2015-10-09 (×2): qty 1

## 2015-10-09 MED ORDER — ALBUTEROL SULFATE HFA 108 (90 BASE) MCG/ACT IN AERS
2.0000 | INHALATION_SPRAY | Freq: Once | RESPIRATORY_TRACT | Status: AC
  Administered 2015-10-09: 2 via RESPIRATORY_TRACT
  Filled 2015-10-09: qty 6.7

## 2015-10-09 MED ORDER — ONDANSETRON HCL 4 MG PO TABS
4.0000 mg | ORAL_TABLET | Freq: Once | ORAL | Status: AC
  Administered 2015-10-09: 4 mg via ORAL
  Filled 2015-10-09: qty 1

## 2015-10-09 NOTE — ED Notes (Signed)
Went in to give pt medication and Pt reports," I forgot to tell the doctor something." Pt reports increased frequency in urination at night and dysuria with urination x several days. PA aware and given verbal order to obtain urine sample. Toileting offered and pt assisted to restroom. nad noted. Pt ambulated with steady gait.

## 2015-10-09 NOTE — ED Notes (Signed)
Having SOB, dizziness and cough for last 4 days.  C/o back pain, rates pain 8/10.

## 2015-10-09 NOTE — ED Provider Notes (Signed)
CSN: [id number]     Arrival date & time 10/09/15  0746 History   First MD Initiated Contact with Patient 10/09/15 [phone]     Chief Complaint  Patient presents with  . Shortness of Breath  . Dizziness  . Cough  . Back Pain     (Consider location/radiation/quality/duration/timing/severity/associated sxs/prior Treatment) HPI Comments: Patient is a 64 year old male who presents to the emergency department with complaint of shortness of breath, dizziness, cough, and back pain.The patient states that over the past 4 or 5 days he's been having increasing problems with shortness of breath and cough. He states that on yesterday he began to have some problems with dizziness. He has a sensation of spinning with certain movements. The patient also states he has some back pain, and would like to have these evaluated. No high fever reported. No vomiting or diarrhea reported. Patient states the phlegm has some color to it but he is unsure if there is any blood in it.  Patient is a 64 y.o. male presenting with shortness of breath, dizziness, cough, and back pain. The history is provided by the patient.  Shortness of Breath Severity:  Moderate Associated symptoms: cough   Dizziness Associated symptoms: shortness of breath   Cough Associated symptoms: shortness of breath   Back Pain   Past Medical History  Diagnosis Date  . Tobacco use   . Murmur   . Cough   . 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 Tricuspid regurgitation 08/26/2014    Mild-mod; Mild Pulm HTN  48 mmHg  . CAP (community acquired pneumonia) 08/25/2014    With mild sepsis.  . Mild mitral valve prolapse 08/26/2014  . COPD (chronic obstructive pulmonary disease) (HCC)    History reviewed. No pertinent past surgical history. History reviewed. No pertinent family history. Social History  Substance Use Topics  . Smoking status: Current Every Day Smoker -- 0.50 packs/day    Types: Cigarettes    Last Attempt to Quit: 09/08/2014  .  Smokeless tobacco: None  . Alcohol Use: No    Review of Systems  Respiratory: Positive for cough and shortness of breath.   Musculoskeletal: Positive for back pain.  Neurological: Positive for dizziness.  All other systems reviewed and are negative.     Allergies  Review of patient's allergies indicates no known allergies.  Home Medications   Prior to Admission medications   Medication Sig Start Date End Date Taking? Authorizing Provider  albuterol (PROVENTIL HFA;VENTOLIN HFA) 108 (90 BASE) MCG/ACT inhaler Inhale 2 puffs into the lungs 3 (three) times daily. 08/27/14  Yes Elliot Cousin, MD  albuterol (PROVENTIL) (2.5 MG/3ML) 0.083% nebulizer solution Take 3 mLs (2.5 mg total) by nebulization every 6 (six) hours as needed for wheezing. 10/01/14  Yes Erick Blinks, MD  ferrous sulfate 325 (65 FE) MG tablet Take 1 tablet (325 mg total) by mouth 2 (two) times daily with a meal. Patient taking differently: Take 325 mg by mouth daily.  08/27/14  Yes Elliot Cousin, MD  ibuprofen (ADVIL,MOTRIN) 200 MG tablet Take 200 mg by mouth every 6 (six) hours as needed.   Yes Historical Provider, MD  ibuprofen (ADVIL,MOTRIN) 800 MG tablet Take 800 mg by mouth every 8 (eight) hours as needed.   Yes Historical Provider, MD  mometasone-formoterol (DULERA) 100-5 MCG/ACT AERO Inhale 2 puffs into the lungs 2 (two) times daily.   Yes Historical Provider, MD  PARoxetine (PAXIL) 20 MG tablet Take 20 mg by mouth daily.   Yes Historical Provider, MD  temazepam (RESTORIL) 15 MG capsule Take 15 mg by mouth at bedtime.   Yes Historical Provider, MD   BP 144/79 mmHg  Pulse 74  Temp(Src) 99.6 F (37.6 C) (Oral)  Resp 14  Ht  (1.626 m)  Wt 112 lb (50.803 kg)  BMI 19.22 kg/m2  SpO2 97% Physical Exam  Constitutional: He is oriented to person, place, and time. He appears well-developed and well-nourished.  Non-toxic appearance.  HENT:  Head: Normocephalic.  Right Ear: Tympanic membrane and external ear  normal.  Left Ear: Tympanic membrane and external ear normal.  Eyes: EOM and lids are normal. Pupils are equal, round, and reactive to light.  Neck: Normal range of motion. Neck supple. Carotid bruit is not present.  Cardiovascular: Normal rate, regular rhythm, normal heart sounds, intact distal pulses and normal pulses.   Pulmonary/Chest: Breath sounds normal. No respiratory distress.  Course breath sounds present, with few scattered rhonchi. Symmetrical rise and fall of the chest. The patient speaks in complete sentences without problem.  Abdominal: Soft. Bowel sounds are normal. There is no tenderness. There is no guarding.  Musculoskeletal: Normal range of motion. He exhibits no edema.       Lumbar back: He exhibits tenderness.  Negative Homans sign  Pain of the lower back with change of position. No hot areas appreciated. No palpable step off.  Lymphadenopathy:       Head (right side): No submandibular adenopathy present.       Head (left side): No submandibular adenopathy present.    He has no cervical adenopathy.  Neurological: He is alert and oriented to person, place, and time. He has normal strength. No cranial nerve deficit or sensory deficit. He exhibits normal muscle tone. Coordination normal.  Skin: Skin is warm and dry.  Psychiatric: He has a normal mood and affect. His speech is normal.  Nursing note and vitals reviewed.   ED Course  Procedures (including critical care time) Labs Review Labs Reviewed  BASIC METABOLIC PANEL - Abnormal; Notable for the following:    Glucose, Bld 115 (*)    BUN 21 (*)    Creatinine, Ser 1.47 (*)    Calcium 8.8 (*)    GFR calc non Af Amer 49 (*)    GFR calc Af Amer 57 (*)    All other components within normal limits  URINALYSIS, ROUTINE W REFLEX MICROSCOPIC (NOT AT [facility]) - Abnormal; Notable for the following:    Hgb urine dipstick TRACE (*)    All other components within normal limits  URINE MICROSCOPIC-ADD ON - Abnormal; Notable for  the following:    Squamous Epithelial / LPF FEW (*)    All other components within normal limits  CBC    Imaging Review Dg Chest 2 View  10/09/2015  CLINICAL DATA:  Shortness of breath for 4 days.  COPD. EXAM: CHEST  2 VIEW COMPARISON:  CT chest 06/14/2015 FINDINGS: The lungs are hyperinflated likely secondary to COPD. There is no focal parenchymal opacity. There is no pleural effusion or pneumothorax. The heart and mediastinal contours are unremarkable. The osseous structures are unremarkable. IMPRESSION: No active cardiopulmonary disease. Electronically Signed   By: Elige Ko   On: 10/09/2015 08:38   I have personally reviewed and evaluated these images and lab results as part of my medical decision-making.   EKG Interpretation None      MDM  The pulse oximetry ranged from 98% to 92% on room air. Patient speaks in complete sentences without problem.  Patient has had similar episodes in the past. Breathing improved with albuterol.  The patient states even having some increasing urination recently, the urine was negative for infection or signs of kidney stone. Complete blood count was within normal limits. The basic metabolic panel showed some increase in renal function, but this has been present during most of the testing done at this facility. The chest x-ray showed no acute cardiopulmonary disease. There was noted hyperinflation related to chronic obstructive pulmonary disease.  Suspect patient had an exacerbation of his chronic obstructive pulmonary disease.  The patient is given albuterol 2 puffs every 4 hours and a prescription for prednisone taper. The patient is to follow-up with his primary physician as soon as possible. He will return to the emergency department if any changes, problems, or concerns.    Final diagnoses:  None    **I have reviewed nursing notes, vital signs, and all appropriate lab and imaging results for this patient.Ivery Quale*    Auriana Scalia, PA-C 10/09/15  2125  Samuel JesterKathleen McManus, DO 10/11/15 [phone]

## 2015-10-09 NOTE — Discharge Instructions (Signed)
Your chest x-ray suggests chronic obstructive pulmonary disease findings, but no acute pneumonia or acute lung findings. Your urine test is negative for infection. You have some abnormality of your kidney function, but this is in the same ranges that it has been over the last few times that you have been checked at this facility. Please see your primary physician concerning both your COPD, and your kidney function. Chronic Obstructive Pulmonary Disease Chronic obstructive pulmonary disease (COPD) is a common lung condition in which airflow from the lungs is limited. COPD is a general term that can be used to describe many different lung problems that limit airflow, including both chronic bronchitis and emphysema. If you have COPD, your lung function will probably never return to normal, but there are measures you can take to improve lung function and make yourself feel better. CAUSES   Smoking (common).  Exposure to secondhand smoke.  Genetic problems.  Chronic inflammatory lung diseases or recurrent infections. SYMPTOMS  Shortness of breath, especially with physical activity.  Deep, persistent (chronic) cough with a large amount of thick mucus.  Wheezing.  Rapid breaths (tachypnea).  Gray or bluish discoloration (cyanosis) of the skin, especially in your fingers, toes, or lips.  Fatigue.  Weight loss.  Frequent infections or episodes when breathing symptoms become much worse (exacerbations).  Chest tightness. DIAGNOSIS Your health care provider will take a medical history and perform a physical examination to diagnose COPD. Additional tests for COPD may include:  Lung (pulmonary) function tests.  Chest X-ray.  CT scan.  Blood tests. TREATMENT  Treatment for COPD may include:  Inhaler and nebulizer medicines. These help manage the symptoms of COPD and make your breathing more comfortable.  Supplemental oxygen. Supplemental oxygen is only helpful if you have a low oxygen  level in your blood.  Exercise and physical activity. These are beneficial for nearly all people with COPD.  Lung surgery or transplant.  Nutrition therapy to gain weight, if you are underweight.  Pulmonary rehabilitation. This may involve working with a team of health care providers and specialists, such as respiratory, occupational, and physical therapists. HOME CARE INSTRUCTIONS  Take all medicines (inhaled or pills) as directed by your health care provider.  Avoid over-the-counter medicines or cough syrups that dry up your airway (such as antihistamines) and slow down the elimination of secretions unless instructed otherwise by your health care provider.  If you are a smoker, the most important thing that you can do is stop smoking. Continuing to smoke will cause further lung damage and breathing trouble. Ask your health care provider for help with quitting smoking. He or she can direct you to community resources or hospitals that provide support.  Avoid exposure to irritants such as smoke, chemicals, and fumes that aggravate your breathing.  Use oxygen therapy and pulmonary rehabilitation if directed by your health care provider. If you require home oxygen therapy, ask your health care provider whether you should purchase a pulse oximeter to measure your oxygen level at home.  Avoid contact with individuals who have a contagious illness.  Avoid extreme temperature and humidity changes.  Eat healthy foods. Eating smaller, more frequent meals and resting before meals may help you maintain your strength.  Stay active, but balance activity with periods of rest. Exercise and physical activity will help you maintain your ability to do things you want to do.  Preventing infection and hospitalization is very important when you have COPD. Make sure to receive all the vaccines your health care  provider recommends, especially the pneumococcal and influenza vaccines. Ask your health care  provider whether you need a pneumonia vaccine.  Learn and use relaxation techniques to manage stress.  Learn and use controlled breathing techniques as directed by your health care provider. Controlled breathing techniques include:  Pursed lip breathing. Start by breathing in (inhaling) through your nose for 1 second. Then, purse your lips as if you were going to whistle and breathe out (exhale) through the pursed lips for 2 seconds.  Diaphragmatic breathing. Start by putting one hand on your abdomen just above your waist. Inhale slowly through your nose. The hand on your abdomen should move out. Then purse your lips and exhale slowly. You should be able to feel the hand on your abdomen moving in as you exhale.  Learn and use controlled coughing to clear mucus from your lungs. Controlled coughing is a series of short, progressive coughs. The steps of controlled coughing are: 1. Lean your head slightly forward. 2. Breathe in deeply using diaphragmatic breathing. 3. Try to hold your breath for 3 seconds. 4. Keep your mouth slightly open while coughing twice. 5. Spit any mucus out into a tissue. 6. Rest and repeat the steps once or twice as needed. SEEK MEDICAL CARE IF:  You are coughing up more mucus than usual.  There is a change in the color or thickness of your mucus.  Your breathing is more labored than usual.  Your breathing is faster than usual. SEEK IMMEDIATE MEDICAL CARE IF:  You have shortness of breath while you are resting.  You have shortness of breath that prevents you from:  Being able to talk.  Performing your usual physical activities.  You have chest pain lasting longer than 5 minutes.  Your skin color is more cyanotic than usual.  You measure low oxygen saturations for longer than 5 minutes with a pulse oximeter. MAKE SURE YOU:  Understand these instructions.  Will watch your condition.  Will get help right away if you are not doing well or get worse.     This information is not intended to replace advice given to you by your health care provider. Make sure you discuss any questions you have with your health care provider.   Document Released: 09/04/2005 Document Revised: 12/16/2014 Document Reviewed: 07/22/2013 Elsevier Interactive Patient Education [employer]

## 2015-10-28 IMAGING — DX DG CHEST 2V
2 series · 2 of 2 positions shown · non-contrast
Comparison: 06/03/2015

CLINICAL DATA: LEFT side chest pain for 1 week, chronic shortness
of breath, history COPD, former smoker

EXAM:
CHEST  2 VIEW

[chest pa]
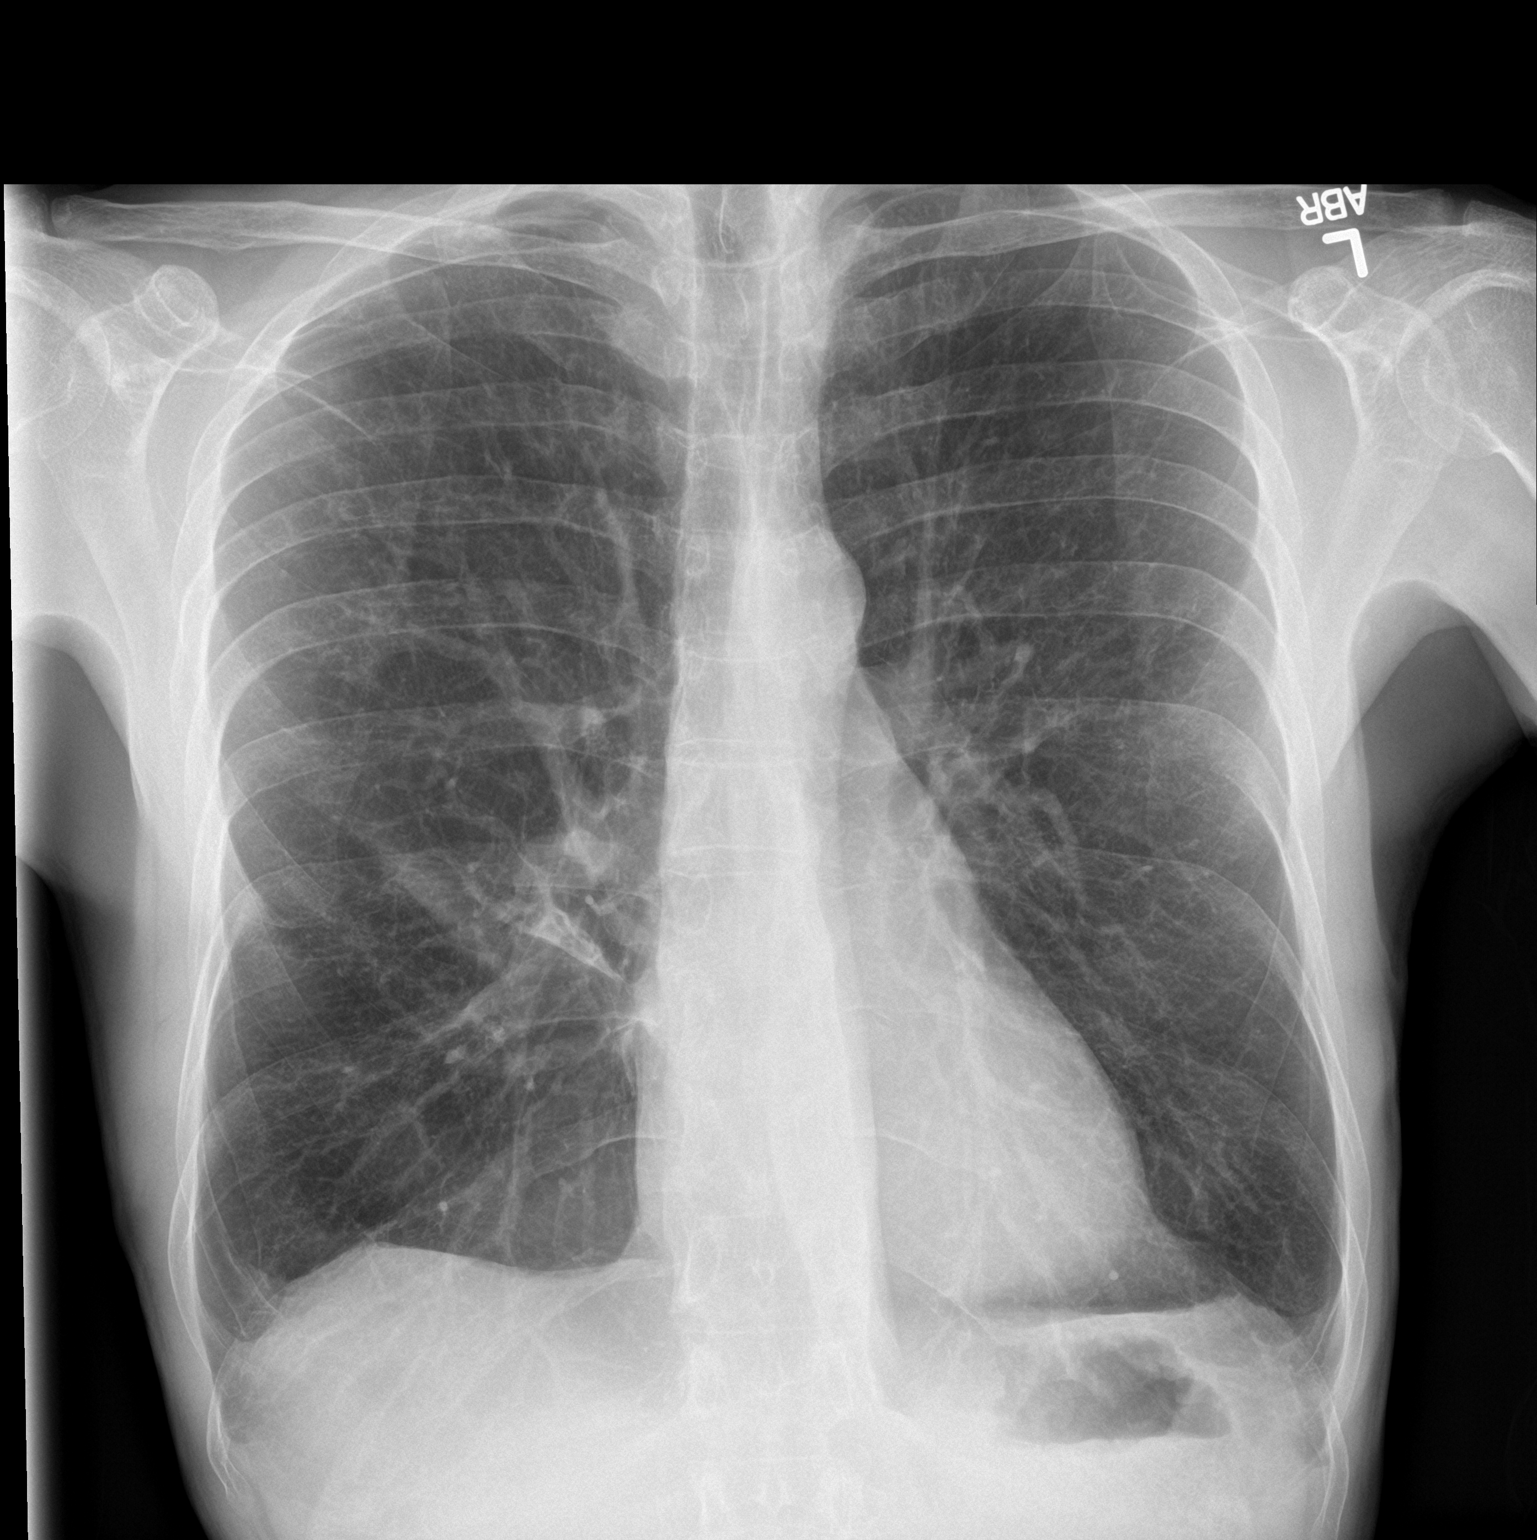

[chest lat]
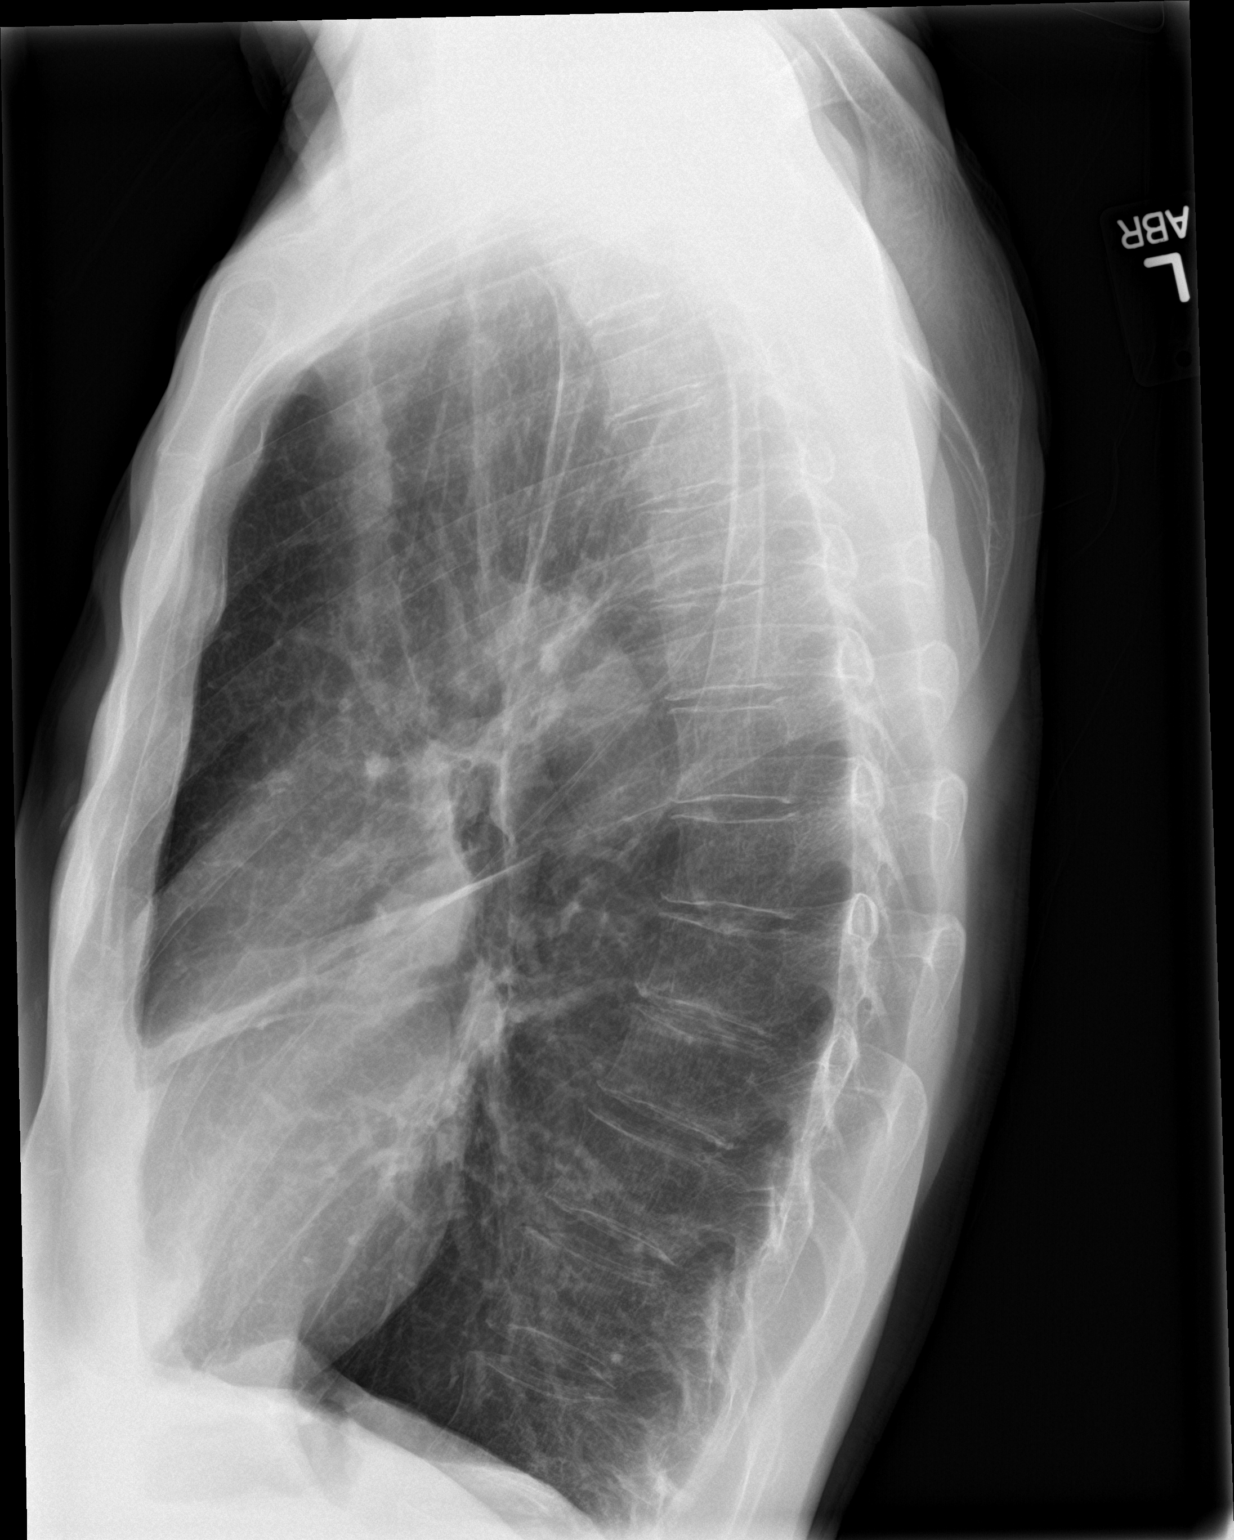

[2 of 2 positions shown; findings below may reference images not displayed]

FINDINGS: Normal heart size, mediastinal contours and pulmonary vascularity.

Scarring at the medial aspect of RIGHT middle lobe unchanged.

Emphysematous and bronchitic changes consistent with COPD.

No acute infiltrate, pleural effusion or pneumothorax.

Question new subtle LEFT upper lobe nodular density 10 mm diameter.

No acute osseous findings.
IMPRESSION: COPD changes with RIGHT middle lobe scarring.

Question subtle new LEFT upper lobe nodule 10 mm diameter; CT chest
recommended to exclude pulmonary nodule.

## 2015-12-26 ENCOUNTER — Encounter (HOSPITAL_COMMUNITY): Payer: Self-pay | Admitting: Emergency Medicine

## 2015-12-26 ENCOUNTER — Emergency Department (HOSPITAL_COMMUNITY)
Admission: EM | Admit: 2015-12-26 | Discharge: 2015-12-27 | Disposition: A | Discharge status: Home / Self Care | Payer: Self-pay | Attending: Emergency Medicine | Admitting: Emergency Medicine

## 2015-12-26 DIAGNOSIS — Z8679 Personal history of other diseases of the circulatory system: Secondary | ICD-10-CM | POA: Insufficient documentation

## 2015-12-26 DIAGNOSIS — Z7951 Long term (current) use of inhaled steroids: Secondary | ICD-10-CM | POA: Insufficient documentation

## 2015-12-26 DIAGNOSIS — K5909 Other constipation: Secondary | ICD-10-CM

## 2015-12-26 DIAGNOSIS — J45909 Unspecified asthma, uncomplicated: Secondary | ICD-10-CM | POA: Insufficient documentation

## 2015-12-26 DIAGNOSIS — Z87891 Personal history of nicotine dependence: Secondary | ICD-10-CM | POA: Insufficient documentation

## 2015-12-26 DIAGNOSIS — Z79899 Other long term (current) drug therapy: Secondary | ICD-10-CM | POA: Insufficient documentation

## 2015-12-26 DIAGNOSIS — K4020 Bilateral inguinal hernia, without obstruction or gangrene, not specified as recurrent: Secondary | ICD-10-CM

## 2015-12-26 DIAGNOSIS — Z8701 Personal history of pneumonia (recurrent): Secondary | ICD-10-CM | POA: Insufficient documentation

## 2015-12-26 DIAGNOSIS — R011 Cardiac murmur, unspecified: Secondary | ICD-10-CM | POA: Insufficient documentation

## 2015-12-26 NOTE — ED Provider Notes (Signed)
CSN: [id number]     Arrival date & time 12/26/15  2147 History   First MD Initiated Contact with Patient 12/26/15 2305     Chief Complaint  Patient presents with  . Groin Pain     (Consider location/radiation/quality/duration/timing/severity/associated sxs/prior Treatment) HPI Comments: Patient reports a bilateral groin pain for 2 or 3 days. The pain is aggravated with movement, walking, and cough. The patient does not recall any injury to the groin. He states he does not do any heavy lifting or straining. He's not had any recent abdominal surgeries.  Patient is a 65 y.o. male presenting with groin pain. The history is provided by the patient.  Groin Pain This is a new problem. The current episode started in the past 7 days. The problem has been unchanged. Pertinent negatives include no chills, fever, nausea or vomiting. The symptoms are aggravated by coughing and walking. He has tried nothing for the symptoms. The treatment provided no relief.    Past Medical History  Diagnosis Date  . Tobacco use   . Murmur   . Cough   . 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 Tricuspid regurgitation 08/26/2014    Mild-mod; Mild Pulm HTN  48 mmHg  . CAP (community acquired pneumonia) 08/25/2014    With mild sepsis.  . Mild mitral valve prolapse 08/26/2014  . COPD (chronic obstructive pulmonary disease) (HCC)    History reviewed. No pertinent past surgical history. No family history on file. Social History  Substance Use Topics  . Smoking status: Former Smoker -- 0.00 packs/day    Types: Cigarettes    Quit date: 09/08/2014  . Smokeless tobacco: None  . Alcohol Use: No    Review of Systems  Constitutional: Negative for fever and chills.  Gastrointestinal: Negative for nausea and vomiting.  Genitourinary:       Groin pain  All other systems reviewed and are negative.     Allergies  Review of patient's allergies indicates no known allergies.  Home Medications   Prior to Admission  medications   Medication Sig Start Date End Date Taking? Authorizing Provider  albuterol (PROVENTIL HFA;VENTOLIN HFA) 108 (90 BASE) MCG/ACT inhaler Inhale 2 puffs into the lungs 3 (three) times daily. 08/27/14   Elliot Cousin, MD  albuterol (PROVENTIL) (2.5 MG/3ML) 0.083% nebulizer solution Take 3 mLs (2.5 mg total) by nebulization every 6 (six) hours as needed for wheezing. 10/01/14   Erick Blinks, MD  ferrous sulfate 325 (65 FE) MG tablet Take 1 tablet (325 mg total) by mouth 2 (two) times daily with a meal. Patient taking differently: Take 325 mg by mouth daily.  08/27/14   Elliot Cousin, MD  ibuprofen (ADVIL,MOTRIN) 200 MG tablet Take 200 mg by mouth every 6 (six) hours as needed.    Historical Provider, MD  ibuprofen (ADVIL,MOTRIN) 800 MG tablet Take 800 mg by mouth every 8 (eight) hours as needed.    Historical Provider, MD  mometasone-formoterol (DULERA) 100-5 MCG/ACT AERO Inhale 2 puffs into the lungs 2 (two) times daily.    Historical Provider, MD  PARoxetine (PAXIL) 20 MG tablet Take 20 mg by mouth daily.    Historical Provider, MD  predniSONE (DELTASONE) 10 MG tablet 5,4,3,2,1 - take with food 10/09/15   Ivery Quale, PA-C  temazepam (RESTORIL) 15 MG capsule Take 15 mg by mouth at bedtime.    Historical Provider, MD   BP 142/63 mmHg  Pulse 61  Temp(Src) 99.7 F (37.6 C) (Oral)  Resp 18  Ht  (1.626  m)  Wt 44.453 kg  BMI 16.81 kg/m2  SpO2 99% Physical Exam  Constitutional: He is oriented to person, place, and time. He appears well-developed and well-nourished.  Non-toxic appearance.  HENT:  Head: Normocephalic.  Right Ear: Tympanic membrane and external ear normal.  Left Ear: Tympanic membrane and external ear normal.  Eyes: EOM and lids are normal. Pupils are equal, round, and reactive to light.  Neck: Normal range of motion. Neck supple. Carotid bruit is not present.  Cardiovascular: Normal rate, regular rhythm, normal heart sounds, intact distal pulses and normal  pulses.   Pulmonary/Chest: Breath sounds normal. No respiratory distress.  Course breath sounds.  Abdominal: Soft. Bowel sounds are normal. There is no tenderness. There is no guarding.  Hernia defect in the inguinal area with Valsalva maneuver on the right and the left. Pain and hernia aggravated with cough.  Musculoskeletal: Normal range of motion.  Lymphadenopathy:       Head (right side): No submandibular adenopathy present.       Head (left side): No submandibular adenopathy present.    He has no cervical adenopathy.  Neurological: He is alert and oriented to person, place, and time. He has normal strength. No cranial nerve deficit or sensory deficit.  Skin: Skin is warm and dry.  Psychiatric: He has a normal mood and affect. His speech is normal.  Nursing note and vitals reviewed.   ED Course  Procedures (including critical care time) Labs Review Labs Reviewed - No data to display  Imaging Review No results found. I have personally reviewed and evaluated these images and lab results as part of my medical decision-making.   EKG Interpretation None      MDM  The temperature is 99.7, the pulse rate is 61, the respiratory rate is 18, the blood pressures 142/63, the oxygen level is 99% on room air. The examination at this point suggest bilateral inguinal hernias. No signs of obstruction or infection. Complete blood count is well within normal limits. Urinalysis is negative for any acute changes.  CT pending.  Pt care to be continued by Dr Elesa Massed.   Final diagnoses:  None    *I have reviewed nursing notes, vital signs, and all appropriate lab and imaging results for this patient.Ivery Quale, PA-C 12/29/15 1315

## 2015-12-26 NOTE — ED Notes (Signed)
Onset 2 days ago, painful in groin area and swelling, worse with movement, and cough

## 2015-12-27 ENCOUNTER — Emergency Department (HOSPITAL_COMMUNITY): Payer: Self-pay

## 2015-12-27 LAB — COMPREHENSIVE METABOLIC PANEL
ALK PHOS: 129 U/L — AB (ref 38–126)
ALT: 18 U/L (ref 17–63)
ANION GAP: 8 (ref 5–15)
AST: 20 U/L (ref 15–41)
Albumin: 3.9 g/dL (ref 3.5–5.0)
BUN: 19 mg/dL (ref 6–20)
CALCIUM: 9.4 mg/dL (ref 8.9–10.3)
CO2: 28 mmol/L (ref 22–32)
CREATININE: 1.08 mg/dL (ref 0.61–1.24)
Chloride: 104 mmol/L (ref 101–111)
Glucose, Bld: 85 mg/dL (ref 65–99)
Potassium: 4.3 mmol/L (ref 3.5–5.1)
Sodium: 140 mmol/L (ref 135–145)
Total Bilirubin: 0.4 mg/dL (ref 0.3–1.2)
Total Protein: 6.8 g/dL (ref 6.5–8.1)

## 2015-12-27 LAB — CBC WITH DIFFERENTIAL/PLATELET
Basophils Absolute: 0.1 10*3/uL (ref 0.0–0.1)
Basophils Relative: 1 %
EOS PCT: 1 %
Eosinophils Absolute: 0.1 10*3/uL (ref 0.0–0.7)
HCT: 39.3 % (ref 39.0–52.0)
Hemoglobin: 13.2 g/dL (ref 13.0–17.0)
LYMPHS ABS: 1.7 10*3/uL (ref 0.7–4.0)
LYMPHS PCT: 20 %
MCH: 30.9 pg (ref 26.0–34.0)
MCHC: 33.6 g/dL (ref 30.0–36.0)
MCV: 92 fL (ref 78.0–100.0)
MONOS PCT: 9 %
Monocytes Absolute: 0.8 10*3/uL (ref 0.1–1.0)
Neutro Abs: 6 10*3/uL (ref 1.7–7.7)
Neutrophils Relative %: 69 %
PLATELETS: 302 10*3/uL (ref 150–400)
RBC: 4.27 MIL/uL (ref 4.22–5.81)
RDW: 14.4 % (ref 11.5–15.5)
WBC: 8.7 10*3/uL (ref 4.0–10.5)

## 2015-12-27 LAB — URINALYSIS, ROUTINE W REFLEX MICROSCOPIC
Bilirubin Urine: NEGATIVE
GLUCOSE, UA: NEGATIVE mg/dL
HGB URINE DIPSTICK: NEGATIVE
Ketones, ur: NEGATIVE mg/dL
Leukocytes, UA: NEGATIVE
Nitrite: NEGATIVE
PH: 7 (ref 5.0–8.0)
Protein, ur: NEGATIVE mg/dL
SPECIFIC GRAVITY, URINE: 1.02 (ref 1.005–1.030)

## 2015-12-27 MED ORDER — DOCUSATE SODIUM 100 MG PO CAPS: 100.0000 mg | ORAL_CAPSULE | Freq: Two times a day (BID) | ORAL | Status: DC

## 2015-12-27 MED ORDER — ONDANSETRON HCL 4 MG/2ML IJ SOLN
4.0000 mg | Freq: Once | INTRAMUSCULAR | Status: AC
  Administered 2015-12-27: 4 mg via INTRAMUSCULAR
  Filled 2015-12-27: qty 2

## 2015-12-27 MED ORDER — ONDANSETRON 4 MG PO TBDP: 4.0000 mg | ORAL_TABLET | Freq: Three times a day (TID) | ORAL | Status: DC | PRN

## 2015-12-27 MED ORDER — HYDROCODONE-ACETAMINOPHEN 5-325 MG PO TABS: 1.0000 | ORAL_TABLET | Freq: Four times a day (QID) | ORAL | Status: DC | PRN

## 2015-12-27 MED ORDER — IOHEXOL 300 MG/ML  SOLN
100.0000 mL | Freq: Once | INTRAMUSCULAR | Status: AC | PRN
  Administered 2015-12-27: 100 mL via INTRAVENOUS

## 2015-12-27 MED ORDER — MORPHINE SULFATE (PF) 4 MG/ML IV SOLN
4.0000 mg | Freq: Once | INTRAVENOUS | Status: AC
  Administered 2015-12-27: 4 mg via INTRAVENOUS
  Filled 2015-12-27: qty 1

## 2015-12-27 MED ORDER — POLYETHYLENE GLYCOL 3350 17 G PO PACK: 17.0000 g | PACK | Freq: Every day | ORAL | Status: DC

## 2015-12-27 NOTE — ED Provider Notes (Signed)
Medical screening examination/treatment/procedure(s) were conducted as a shared visit with non-physician practitioner(s) and myself.  I personally evaluated the patient during the encounter.   EKG Interpretation None      Pt is a 65 y.o. male with history of COPD who presents to the emergency department with bilateral inguinal pain for several days. No history of straining, lifting anything heavy. He does have bilateral inguinal hernias which appear mild. I am able to reduce these hernias when patient is in Trendelenburg. He is tender over this area. No testicular pain, tenderness on exam. No testicular swelling. Hemodynamically stable. Labs, urine unremarkable. CT scan shows moderate stool in the distal colon with no bowel obstruction. No evidence of hernia. Prostate and seminal vesicles appear normal. Normal appendix. He has prominence of the vasculature of the spermatic cord bilaterally and no drainable fluid collection or abscess. His testicular exam is completely normal. Discussed these findings with patient and have recommended bowel regimen. We'll discharge with short prescription for pain medication but have discussed with him that this could increase constipation. Recommendedclose outpatient follow-up with his PCP. Discussed return precautions. He verbalizes understanding and is comfortable with this plan.  Layla Maw Torina Ey, DO 12/27/15 [phone]

## 2015-12-27 NOTE — Discharge Instructions (Signed)
Hernia, Adult °A hernia is the bulging of an organ or tissue through a weak spot in the muscles of the abdomen (abdominal wall). Hernias develop most often near the navel or groin. °There are many kinds of hernias. Common kinds include: °· Femoral hernia. This kind of hernia develops under the groin in the upper thigh area. °· Inguinal hernia. This kind of hernia develops in the groin or scrotum. °· Umbilical hernia. This kind of hernia develops near the navel. °· Hiatal hernia. This kind of hernia causes part of the stomach to be pushed up into the chest. °· Incisional hernia. This kind of hernia bulges through a scar from an abdominal surgery. °CAUSES °This condition may be caused by: °· Heavy lifting. °· Coughing over a long period of time. °· Straining to have a bowel movement. °· An incision made during an abdominal surgery. °· A birth defect (congenital defect). °· Excess weight or obesity. °· Smoking. °· Poor nutrition. °· Cystic fibrosis. °· Excess fluid in the abdomen. °· Undescended testicles. °SYMPTOMS °Symptoms of a hernia include: °· A lump on the abdomen. This is the first sign of a hernia. The lump may become more obvious with standing, straining, or coughing. It may get bigger over time if it is not treated or if the condition causing it is not treated. °· Pain. A hernia is usually painless, but it may become painful over time if treatment is delayed. The pain is usually dull and may get worse with standing or lifting heavy objects. °Sometimes a hernia gets tightly squeezed in the weak spot (strangulated) or stuck there (incarcerated) and causes additional symptoms. These symptoms may include: °· Vomiting. °· Nausea. °· Constipation. °· Irritability. °DIAGNOSIS °A hernia may be diagnosed with: °· A physical exam. During the exam your health care provider may ask you to cough or to make a specific movement, because a hernia is usually more visible when you move. °· Imaging tests. These can  include: °· X-rays. °· Ultrasound. °· CT scan. °TREATMENT °A hernia that is small and painless may not need to be treated. A hernia that is large or painful may be treated with surgery. Inguinal hernias may be treated with surgery to prevent incarceration or strangulation. Strangulated hernias are always treated with surgery, because lack of blood to the trapped organ or tissue can cause it to die. °Surgery to treat a hernia involves pushing the bulge back into place and repairing the weak part of the abdomen. °HOME CARE INSTRUCTIONS °· Avoid straining. °· Do not lift anything heavier than 10 lb (4.5 kg). °· Lift with your leg muscles, not your back muscles. This helps avoid strain. °· When coughing, try to cough gently. °· Prevent constipation. Constipation leads to straining with bowel movements, which can make a hernia worse or cause a hernia repair to break down. You can prevent constipation by: °· Eating a high-fiber diet that includes plenty of fruits and vegetables. °· Drinking enough fluids to keep your urine clear or pale yellow. Aim to drink 6-8 glasses of water per day. °· Using a stool softener as directed by your health care provider. °· Lose weight, if you are overweight. °· Do not use any tobacco products, including cigarettes, chewing tobacco, or electronic cigarettes. If you need help quitting, ask your health care provider. °· Keep all follow-up visits as directed by your health care provider. This is important. Your health care provider may need to monitor your condition. °SEEK MEDICAL CARE IF: °· You have   swelling, redness, and pain in the affected area.  Your bowel habits change. SEEK IMMEDIATE MEDICAL CARE IF:  You have a fever.  You have abdominal pain that is getting worse.  You feel nauseous or you vomit.  You cannot push the hernia back in place by gently pressing on it while you are lying down.  The hernia:  Changes in shape or size.  Is stuck outside the  abdomen.  Becomes discolored.  Feels hard or tender.   This information is not intended to replace advice given to you by your health care provider. Make sure you discuss any questions you have with your health care provider.   Document Released: 11/25/2005 Document Revised: 12/16/2014 Document Reviewed: 10/05/2014 Elsevier Interactive Patient Education 2016 [employer].  Constipation, Adult Constipation is when a person has fewer than three bowel movements a week, has difficulty having a bowel movement, or has stools that are dry, hard, or larger than normal. As people grow older, constipation is more common. A low-fiber diet, not taking in enough fluids, and taking certain medicines may make constipation worse.  CAUSES   Certain medicines, such as antidepressants, pain medicine, iron supplements, antacids, and water pills.   Certain diseases, such as diabetes, irritable bowel syndrome (IBS), thyroid disease, or depression.   Not drinking enough water.   Not eating enough fiber-rich foods.   Stress or travel.   Lack of physical activity or exercise.   Ignoring the urge to have a bowel movement.   Using laxatives too much.  SIGNS AND SYMPTOMS   Having fewer than three bowel movements a week.   Straining to have a bowel movement.   Having stools that are hard, dry, or larger than normal.   Feeling full or bloated.   Pain in the lower abdomen.   Not feeling relief after having a bowel movement.  DIAGNOSIS  Your health care provider will take a medical history and perform a physical exam. Further testing may be done for severe constipation. Some tests may include:  A barium enema X-ray to examine your rectum, colon, and, sometimes, your small intestine.   A sigmoidoscopy to examine your lower colon.   A colonoscopy to examine your entire colon. TREATMENT  Treatment will depend on the severity of your constipation and what is causing it. Some dietary  treatments include drinking more fluids and eating more fiber-rich foods. Lifestyle treatments may include regular exercise. If these diet and lifestyle recommendations do not help, your health care provider may recommend taking over-the-counter laxative medicines to help you have bowel movements. Prescription medicines may be prescribed if over-the-counter medicines do not work.  HOME CARE INSTRUCTIONS   Eat foods that have a lot of fiber, such as fruits, vegetables, whole grains, and beans.  Limit foods high in fat and processed sugars, such as french fries, hamburgers, cookies, candies, and soda.   A fiber supplement may be added to your diet if you cannot get enough fiber from foods.   Drink enough fluids to keep your urine clear or pale yellow.   Exercise regularly or as directed by your health care provider.   Go to the restroom when you have the urge to go. Do not hold it.   Only take over-the-counter or prescription medicines as directed by your health care provider. Do not take other medicines for constipation without talking to your health care provider first.  SEEK IMMEDIATE MEDICAL CARE IF:   You have bright red blood in your stool.  Your constipation lasts for more than 4 days or gets worse.   You have abdominal or rectal pain.   You have thin, pencil-like stools.   You have unexplained weight loss. MAKE SURE YOU:   Understand these instructions.  Will watch your condition.  Will get help right away if you are not doing well or get worse.   This information is not intended to replace advice given to you by your health care provider. Make sure you discuss any questions you have with your health care provider.   Document Released: 08/23/2004 Document Revised: 12/16/2014 Document Reviewed: 09/06/2013 Elsevier Interactive Patient Education 2016 Elsevier Inc.  High-Fiber Diet Fiber, also called dietary fiber, is a type of carbohydrate found in fruits,  vegetables, whole grains, and beans. A high-fiber diet can have many health benefits. Your health care provider may recommend a high-fiber diet to help:  Prevent constipation. Fiber can make your bowel movements more regular.  Lower your cholesterol.  Relieve hemorrhoids, uncomplicated diverticulosis, or irritable bowel syndrome.  Prevent overeating as part of a weight-loss plan.  Prevent heart disease, type 2 diabetes, and certain cancers. WHAT IS MY PLAN? The recommended daily intake of fiber includes:  38 grams for men under age 58.  30 grams for men over age 19.  25 grams for women under age 15.  21 grams for women over age 74. You can get the recommended daily intake of dietary fiber by eating a variety of fruits, vegetables, grains, and beans. Your health care provider may also recommend a fiber supplement if it is not possible to get enough fiber through your diet. WHAT DO I NEED TO KNOW ABOUT A HIGH-FIBER DIET?  Fiber supplements have not been widely studied for their effectiveness, so it is better to get fiber through food sources.  Always check the fiber content on thenutrition facts label of any prepackaged food. Look for foods that contain at least 5 grams of fiber per serving.  Ask your dietitian if you have questions about specific foods that are related to your condition, especially if those foods are not listed in the following section.  Increase your daily fiber consumption gradually. Increasing your intake of dietary fiber too quickly may cause bloating, cramping, or gas.  Drink plenty of water. Water helps you to digest fiber. WHAT FOODS CAN I EAT? Grains Whole-grain breads. Multigrain cereal. Oats and oatmeal. Brown rice. Barley. Bulgur wheat. Millet. Bran muffins. Popcorn. Rye wafer crackers. Vegetables Sweet potatoes. Spinach. Kale. Artichokes. Cabbage. Broccoli. Green peas. Carrots. Squash. Fruits Berries. Pears. Apples. Oranges. Avocados. Prunes and  raisins. Dried figs. Meats and Other Protein Sources Navy, kidney, pinto, and soy beans. Split peas. Lentils. Nuts and seeds. Dairy Fiber-fortified yogurt. Beverages Fiber-fortified soy milk. Fiber-fortified orange juice. Other Fiber bars. The items listed above may not be a complete list of recommended foods or beverages. Contact your dietitian for more options. WHAT FOODS ARE NOT RECOMMENDED? Grains White bread. Pasta made with refined flour. White rice. Vegetables Fried potatoes. Canned vegetables. Well-cooked vegetables.  Fruits Fruit juice. Cooked, strained fruit. Meats and Other Protein Sources Fatty cuts of meat. Fried Environmental education officer or fried fish. Dairy Milk. Yogurt. Cream cheese. Sour cream. Beverages Soft drinks. Other Cakes and pastries. Butter and oils. The items listed above may not be a complete list of foods and beverages to avoid. Contact your dietitian for more information. WHAT ARE SOME TIPS FOR INCLUDING HIGH-FIBER FOODS IN MY DIET?  Eat a wide variety of high-fiber foods.  Make  sure that half of all grains consumed each day are whole grains.  Replace breads and cereals made from refined flour or white flour with whole-grain breads and cereals.  Replace white rice with brown rice, bulgur wheat, or millet.  Start the day with a breakfast that is high in fiber, such as a cereal that contains at least 5 grams of fiber per serving.  Use beans in place of meat in soups, salads, or pasta.  Eat high-fiber snacks, such as berries, raw vegetables, nuts, or popcorn.   This information is not intended to replace advice given to you by your health care provider. Make sure you discuss any questions you have with your health care provider.   Document Released: 11/25/2005 Document Revised: 12/16/2014 Document Reviewed: 05/10/2014 Elsevier Interactive Patient Education [employer].

## 2016-02-23 ENCOUNTER — Encounter (HOSPITAL_COMMUNITY): Payer: Self-pay

## 2016-02-23 ENCOUNTER — Emergency Department (HOSPITAL_COMMUNITY): Payer: Self-pay

## 2016-02-23 ENCOUNTER — Emergency Department (HOSPITAL_COMMUNITY)
Admission: EM | Admit: 2016-02-23 | Discharge: 2016-02-24 | Disposition: A | Discharge status: Home / Self Care | Payer: Self-pay | Attending: Emergency Medicine | Admitting: Emergency Medicine

## 2016-02-23 DIAGNOSIS — J441 Chronic obstructive pulmonary disease with (acute) exacerbation: Secondary | ICD-10-CM | POA: Insufficient documentation

## 2016-02-23 DIAGNOSIS — Z87891 Personal history of nicotine dependence: Secondary | ICD-10-CM | POA: Insufficient documentation

## 2016-02-23 LAB — BASIC METABOLIC PANEL
Anion gap: 9 (ref 5–15)
BUN: 20 mg/dL (ref 6–20)
CHLORIDE: 105 mmol/L (ref 101–111)
CO2: 26 mmol/L (ref 22–32)
Calcium: 9.1 mg/dL (ref 8.9–10.3)
Creatinine, Ser: 1.15 mg/dL (ref 0.61–1.24)
Glucose, Bld: 88 mg/dL (ref 65–99)
POTASSIUM: 4 mmol/L (ref 3.5–5.1)
SODIUM: 140 mmol/L (ref 135–145)

## 2016-02-23 LAB — CBC
HEMATOCRIT: 42 % (ref 39.0–52.0)
Hemoglobin: 14 g/dL (ref 13.0–17.0)
MCH: 31 pg (ref 26.0–34.0)
MCHC: 33.3 g/dL (ref 30.0–36.0)
MCV: 92.9 fL (ref 78.0–100.0)
PLATELETS: 262 10*3/uL (ref 150–400)
RBC: 4.52 MIL/uL (ref 4.22–5.81)
RDW: 14.3 % (ref 11.5–15.5)
WBC: 8.9 10*3/uL (ref 4.0–10.5)

## 2016-02-23 NOTE — ED Notes (Signed)
I think my COPD is kicking up again, having a cough and shortness of breath.

## 2016-02-24 LAB — BRAIN NATRIURETIC PEPTIDE: B NATRIURETIC PEPTIDE 5: 81 pg/mL (ref 0.0–100.0)

## 2016-02-24 MED ORDER — AZITHROMYCIN 250 MG PO TABS: 250.0000 mg | ORAL_TABLET | Freq: Every day | ORAL | Status: DC

## 2016-02-24 MED ORDER — IPRATROPIUM-ALBUTEROL 0.5-2.5 (3) MG/3ML IN SOLN
3.0000 mL | Freq: Once | RESPIRATORY_TRACT | Status: AC
  Administered 2016-02-24: 3 mL via RESPIRATORY_TRACT
  Filled 2016-02-24: qty 3

## 2016-02-24 MED ORDER — ALBUTEROL SULFATE (2.5 MG/3ML) 0.083% IN NEBU
5.0000 mg | INHALATION_SOLUTION | Freq: Once | RESPIRATORY_TRACT | Status: AC
  Administered 2016-02-24: 5 mg via RESPIRATORY_TRACT
  Filled 2016-02-24: qty 6

## 2016-02-24 MED ORDER — DOXYCYCLINE HYCLATE 100 MG PO TABS
100.0000 mg | ORAL_TABLET | Freq: Once | ORAL | Status: AC
  Administered 2016-02-24: 100 mg via ORAL
  Filled 2016-02-24: qty 1

## 2016-02-24 MED ORDER — METHYLPREDNISOLONE SODIUM SUCC 125 MG IJ SOLR
125.0000 mg | Freq: Once | INTRAMUSCULAR | Status: AC
  Administered 2016-02-24: 125 mg via INTRAVENOUS
  Filled 2016-02-24: qty 2

## 2016-02-24 MED ORDER — ALBUTEROL (5 MG/ML) CONTINUOUS INHALATION SOLN
10.0000 mg/h | INHALATION_SOLUTION | Freq: Once | RESPIRATORY_TRACT | Status: AC
  Administered 2016-02-24: 10 mg/h via RESPIRATORY_TRACT
  Filled 2016-02-24: qty 20

## 2016-02-24 MED ORDER — ALBUTEROL SULFATE HFA 108 (90 BASE) MCG/ACT IN AERS
2.0000 | INHALATION_SPRAY | Freq: Once | RESPIRATORY_TRACT | Status: DC
  Filled 2016-02-24: qty 6.7

## 2016-02-24 NOTE — ED Notes (Signed)
Julie PA at bedside,  

## 2016-02-24 NOTE — ED Notes (Signed)
Pt c/o sob that started yesterday, feels like it may be his copd acting up,

## 2016-02-24 NOTE — ED Notes (Signed)
Ambulate with pulse Ox in hallway pt o2 started at 98 dropped to 90 then ranged 90-93. After returning to bed sats dropped to 87 then recovered to 97-98.

## 2016-02-24 NOTE — ED Notes (Signed)
Pt updated on plan of care. Raynelle FanningJulie, PA at bedside,

## 2016-02-24 NOTE — ED Provider Notes (Signed)
CSN: [id number]     Arrival date & time 02/23/16  2247 History   First MD Initiated Contact with Patient 02/23/16 2330     Chief Complaint  Patient presents with  . Shortness of Breath     (Consider location/radiation/quality/duration/timing/severity/associated sxs/prior Treatment) The history is provided by the patient.   Vincent Robertson is a 65 y.o. male presenting with increased cough with white to yellow thick sputum production (denies hemoptysis) along with shortness of breath and wheezing with exertion.  He denies chest pain, orthopnea and peripheral edema. He reports feeling like he is not getting enough air with intake. He uses his dulera bid and has albuterol for rescue use, states has used this inhaler 4 times this week.  He denies fevers, chills, abdominal pain, nausea, vomiting or other complaints.  He states occasionally still smokes when under stress and had one cigarette today, but for the most part does not smoke daily.      Past Medical History  Diagnosis Date  . Tobacco use   . Murmur   . Cough   . 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 Tricuspid regurgitation 08/26/2014    Mild-mod; Mild Pulm HTN  48 mmHg  . CAP (community acquired pneumonia) 08/25/2014    With mild sepsis.  . Mild mitral valve prolapse 08/26/2014  . COPD (chronic obstructive pulmonary disease) (HCC)    History reviewed. No pertinent past surgical history. No family history on file. Social History  Substance Use Topics  . Smoking status: Former Smoker -- 0.00 packs/day    Types: Cigarettes    Quit date: 09/08/2014  . Smokeless tobacco: None  . Alcohol Use: No    Review of Systems  Constitutional: Negative for fever.  HENT: Negative for congestion and sore throat.   Eyes: Negative.   Respiratory: Positive for chest tightness, shortness of breath and wheezing.   Cardiovascular: Negative for chest pain, palpitations and leg swelling.  Gastrointestinal: Negative for nausea and  abdominal pain.  Genitourinary: Negative.   Musculoskeletal: Negative for joint swelling, arthralgias and neck pain.  Skin: Negative.  Negative for rash and wound.  Neurological: Negative for dizziness, weakness, light-headedness, numbness and headaches.  Psychiatric/Behavioral: Negative.       Allergies  Review of patient's allergies indicates no known allergies.  Home Medications   Prior to Admission medications   Medication Sig Start Date End Date Taking? Authorizing Provider  albuterol (PROVENTIL HFA;VENTOLIN HFA) 108 (90 BASE) MCG/ACT inhaler Inhale 2 puffs into the lungs 3 (three) times daily. 08/27/14   Elliot Cousin, MD  albuterol (PROVENTIL) (2.5 MG/3ML) 0.083% nebulizer solution Take 3 mLs (2.5 mg total) by nebulization every 6 (six) hours as needed for wheezing. 10/01/14   Erick Blinks, MD  docusate sodium (COLACE) 100 MG capsule Take 1 capsule (100 mg total) by mouth every 12 (twelve) hours. 12/27/15   Kristen N Ward, DO  ferrous sulfate 325 (65 FE) MG tablet Take 1 tablet (325 mg total) by mouth 2 (two) times daily with a meal. Patient taking differently: Take 325 mg by mouth daily.  08/27/14   Elliot Cousin, MD  HYDROcodone-acetaminophen (NORCO/VICODIN) 5-325 MG tablet Take 1-2 tablets by mouth every 6 (six) hours as needed. 12/27/15   Kristen N Ward, DO  ibuprofen (ADVIL,MOTRIN) 200 MG tablet Take 200 mg by mouth every 6 (six) hours as needed.    Historical Provider, MD  ibuprofen (ADVIL,MOTRIN) 800 MG tablet Take 800 mg by mouth every 8 (eight) hours as needed.  Historical Provider, MD  mometasone-formoterol (DULERA) 100-5 MCG/ACT AERO Inhale 2 puffs into the lungs 2 (two) times daily.    Historical Provider, MD  ondansetron (ZOFRAN ODT) 4 MG disintegrating tablet Take 1 tablet (4 mg total) by mouth every 8 (eight) hours as needed for nausea or vomiting. 12/27/15   Kristen N Ward, DO  PARoxetine (PAXIL) 20 MG tablet Take 20 mg by mouth daily.    Historical Provider, MD   polyethylene glycol (MIRALAX / GLYCOLAX) packet Take 17 g by mouth daily. 12/27/15   Kristen N Ward, DO  predniSONE (DELTASONE) 10 MG tablet 5,4,3,2,1 - take with food 10/09/15   Ivery QualeHobson Bryant, PA-C  temazepam (RESTORIL) 15 MG capsule Take 15 mg by mouth at bedtime.    Historical Provider, MD   BP 144/60 mmHg  Pulse 83  Temp(Src) 97.5 F (36.4 C) (Oral)  Resp 16  Ht 5\' 4"  (1.626 m)  Wt 48.988 kg  BMI 18.53 kg/m2  SpO2 94% Physical Exam  Constitutional: He appears well-developed and well-nourished.  HENT:  Head: Normocephalic and atraumatic.  Eyes: Conjunctivae are normal.  Neck: Normal range of motion.  Cardiovascular: Normal rate, regular rhythm, normal heart sounds and intact distal pulses.   Pulmonary/Chest: Effort normal. No respiratory distress. He has decreased breath sounds. He has no wheezes. He has no rales. He exhibits no tenderness.  Decreased breath sounds throughout with prolonged expiration, no wheezing.  Distant sounds throughout.  Abdominal: Soft. Bowel sounds are normal. There is no tenderness. There is no guarding.  Musculoskeletal: Normal range of motion. He exhibits no edema.  Neurological: He is alert.  Skin: Skin is warm and dry.  Psychiatric: He has a normal mood and affect.  Nursing note and vitals reviewed.   ED Course  Procedures (including critical care time)  Pt given albuterol/atrovent neb tx.  Solumedrol IV.  Examined after first neb, a little bit of improvement , cough more productive, but still feels like he is trying to breath "through a mask".  Will repeat albuterol neb.  Xray suggesting vascular congestion, bnp added to labs.  2:03 AM Recheck of sx after hour long albuterol neb.  Pt does not appear in respiratory distress, pulse ox 96% on room air. bnp normal.  Still with complaint of not feeling any better with breathing. Tight breath sounds, distant.  No wheezing on repeat exam.  Labs Review Labs Reviewed  BASIC METABOLIC PANEL  CBC   BRAIN NATRIURETIC PEPTIDE    Imaging Review Dg Chest 2 View  02/23/2016  CLINICAL DATA:  Acute onset of cough and shortness of breath. Initial encounter. EXAM: CHEST  2 VIEW COMPARISON:  Chest radiograph performed 10/09/2015 FINDINGS: The lungs are hyperexpanded, with flattening of the hemidiaphragms, compatible with COPD. Vascular congestion is noted. There is no evidence of focal opacification, pleural effusion or pneumothorax. The heart is normal in size; the mediastinal contour is within normal limits. No acute osseous abnormalities are seen. IMPRESSION: Findings of COPD. Vascular congestion noted. Lungs otherwise grossly clear. Electronically Signed   By: Roanna RaiderJeffery  Chang M.D.   On: 02/23/2016 23:57   I have personally reviewed and evaluated these images and lab results as part of my medical decision-making.   EKG Interpretation   Date/Time:  Saturday February 24 2016 00:10:52 EDT Ventricular Rate:  73 PR Interval:  141 QRS Duration: 93 QT Interval:  416 QTC Calculation: 458 R Axis:   80 Text Interpretation:  Sinus rhythm Left atrial enlargement Minimal ST  elevation, inferior leads  No significant change since last tracing  Confirmed by Rhunette Croft, MD, ANKIT [phone]) on 02/24/2016 1:04:05 AM      MDM   Final diagnoses:  Chronic obstructive pulmonary disease with acute exacerbation (HCC)    Discussed with Dr. Rhunette Croft who will eval and dispo patient.  Walking pulse ox ordered.    Burgess Amor, PA-C 02/24/16 [id number]  Derwood Kaplan, MD 02/24/16 [phone]

## 2016-02-24 NOTE — Discharge Instructions (Signed)
We saw you in the ER for your COPd  related complains. We gave you some breathing treatments in the ER, and seems like your symptoms have improved. Please take albuterol as needed every 4 hours. Please take the medications prescribed. Please refrain from smoking or smoke exposure. Please see a primary care doctor in 1 week. Return to the ER if your symptoms worsen.   Chronic Obstructive Pulmonary Disease Exacerbation Chronic obstructive pulmonary disease (COPD) is a common lung condition in which airflow from the lungs is limited. COPD is a general term that can be used to describe many different lung problems that limit airflow, including chronic bronchitis and emphysema. COPD exacerbations are episodes when breathing symptoms become much worse and require extra treatment. Without treatment, COPD exacerbations can be life threatening, and frequent COPD exacerbations can cause further damage to your lungs. CAUSES  Respiratory infections.  Exposure to smoke.  Exposure to air pollution, chemical fumes, or dust. Sometimes there is no apparent cause or trigger. RISK FACTORS  Smoking cigarettes.  Older age.  Frequent prior COPD exacerbations. SIGNS AND SYMPTOMS  Increased coughing.  Increased thick spit (sputum) production.  Increased wheezing.  Increased shortness of breath.  Rapid breathing.  Chest tightness. DIAGNOSIS Your medical history, a physical exam, and tests will help your health care provider make a diagnosis. Tests may include:  A chest X-ray.  Basic lab tests.  Sputum testing.  An arterial blood gas test. TREATMENT Depending on the severity of your COPD exacerbation, you may need to be admitted to a hospital for treatment. Some of the treatments commonly used to treat COPD exacerbations are:   Antibiotic medicines.  Bronchodilators. These are drugs that expand the air passages. They may be given with an inhaler or nebulizer. Spacer devices may be needed  to help improve drug delivery.  Corticosteroid medicines.  Supplemental oxygen therapy.  Airway clearing techniques, such as noninvasive ventilation (NIV) and positive expiratory pressure (PEP). These provide respiratory support through a mask or other noninvasive device. HOME CARE INSTRUCTIONS  Do not smoke. Quitting smoking is very important to prevent COPD from getting worse and exacerbations from happening as often.  Avoid exposure to all substances that irritate the airway, especially to tobacco smoke.  If you were prescribed an antibiotic medicine, finish it all even if you start to feel better.  Take all medicines as directed by your health care provider.It is important to use correct technique with inhaled medicines.  Drink enough fluids to keep your urine clear or pale yellow (unless you have a medical condition that requires fluid restriction).  Use a cool mist vaporizer. This makes it easier to clear your chest when you cough.  If you have a home nebulizer and oxygen, continue to use them as directed.  Maintain all necessary vaccinations to prevent infections.  Exercise regularly.  Eat a healthy diet.  Keep all follow-up appointments as directed by your health care provider. SEEK IMMEDIATE MEDICAL CARE IF:  You have worsening shortness of breath.  You have trouble talking.  You have severe chest pain.  You have blood in your sputum.  You have a fever.  You have weakness, vomit repeatedly, or faint.  You feel confused.  You continue to get worse. MAKE SURE YOU:  Understand these instructions.  Will watch your condition.  Will get help right away if you are not doing well or get worse.   This information is not intended to replace advice given to you by your health care  provider. Make sure you discuss any questions you have with your health care provider.   Document Released: 09/22/2007 Document Revised: 12/16/2014 Document Reviewed:  07/30/2013 Elsevier Interactive Patient Education [employer]2016 Elsevier Inc.

## 2016-08-04 ENCOUNTER — Encounter (HOSPITAL_COMMUNITY): Payer: Self-pay | Admitting: Emergency Medicine

## 2016-08-04 ENCOUNTER — Emergency Department (HOSPITAL_COMMUNITY): Payer: Self-pay

## 2016-08-04 ENCOUNTER — Other Ambulatory Visit: Payer: Self-pay

## 2016-08-04 ENCOUNTER — Emergency Department (HOSPITAL_COMMUNITY)
Admission: EM | Admit: 2016-08-04 | Discharge: 2016-08-04 | Disposition: A | Discharge status: Home / Self Care | Payer: Self-pay | Attending: Emergency Medicine | Admitting: Emergency Medicine

## 2016-08-04 DIAGNOSIS — J441 Chronic obstructive pulmonary disease with (acute) exacerbation: Secondary | ICD-10-CM | POA: Insufficient documentation

## 2016-08-04 DIAGNOSIS — Z791 Long term (current) use of non-steroidal anti-inflammatories (NSAID): Secondary | ICD-10-CM | POA: Insufficient documentation

## 2016-08-04 DIAGNOSIS — Z79899 Other long term (current) drug therapy: Secondary | ICD-10-CM | POA: Insufficient documentation

## 2016-08-04 DIAGNOSIS — Z87891 Personal history of nicotine dependence: Secondary | ICD-10-CM | POA: Insufficient documentation

## 2016-08-04 LAB — TROPONIN I: Troponin I: <0.03 ng/mL (ref <0.03)

## 2016-08-04 LAB — COMPREHENSIVE METABOLIC PANEL
ALBUMIN: 4.3 g/dL (ref 3.5–5.0)
ALK PHOS: 89 U/L (ref 38–126)
ALT: 28 U/L (ref 17–63)
ANION GAP: 3 — AB (ref 5–15)
AST: 39 U/L (ref 15–41)
BUN: 22 mg/dL — ABNORMAL HIGH (ref 6–20)
CALCIUM: 8.7 mg/dL — AB (ref 8.9–10.3)
CO2: 26 mmol/L (ref 22–32)
Chloride: 106 mmol/L (ref 101–111)
Creatinine, Ser: 1.18 mg/dL (ref 0.61–1.24)
GFR calc Af Amer: >60 mL/min (ref >60)
GFR calc non Af Amer: >60 mL/min (ref >60)
GLUCOSE: 81 mg/dL (ref 65–99)
Potassium: 4.2 mmol/L (ref 3.5–5.1)
SODIUM: 135 mmol/L (ref 135–145)
Total Bilirubin: 0.4 mg/dL (ref 0.3–1.2)
Total Protein: 7 g/dL (ref 6.5–8.1)

## 2016-08-04 LAB — CBC
HCT: 42.3 % (ref 39.0–52.0)
HEMOGLOBIN: 14.1 g/dL (ref 13.0–17.0)
MCH: 30.7 pg (ref 26.0–34.0)
MCHC: 33.3 g/dL (ref 30.0–36.0)
MCV: 92.2 fL (ref 78.0–100.0)
Platelets: 290 10*3/uL (ref 150–400)
RBC: 4.59 MIL/uL (ref 4.22–5.81)
RDW: 13.4 % (ref 11.5–15.5)
WBC: 5.8 10*3/uL (ref 4.0–10.5)

## 2016-08-04 MED ORDER — DOXYCYCLINE HYCLATE 100 MG PO TABS: 100.0000 mg | ORAL_TABLET | Freq: Two times a day (BID) | ORAL | 0 refills | Status: DC

## 2016-08-04 MED ORDER — IPRATROPIUM BROMIDE 0.02 % IN SOLN
1.0000 mg | Freq: Once | RESPIRATORY_TRACT | Status: AC
  Administered 2016-08-04: 1 mg via RESPIRATORY_TRACT
  Filled 2016-08-04: qty 5

## 2016-08-04 MED ORDER — PREDNISONE 20 MG PO TABS: 40.0000 mg | ORAL_TABLET | Freq: Every day | ORAL | 0 refills | Status: DC

## 2016-08-04 MED ORDER — ALBUTEROL (5 MG/ML) CONTINUOUS INHALATION SOLN
10.0000 mg/h | INHALATION_SOLUTION | Freq: Once | RESPIRATORY_TRACT | Status: AC
  Administered 2016-08-04: 10 mg/h via RESPIRATORY_TRACT
  Filled 2016-08-04: qty 20

## 2016-08-04 MED ORDER — ALBUTEROL SULFATE HFA 108 (90 BASE) MCG/ACT IN AERS
4.0000 | INHALATION_SPRAY | RESPIRATORY_TRACT | Status: AC
  Administered 2016-08-04: 4 via RESPIRATORY_TRACT
  Filled 2016-08-04: qty 6.7

## 2016-08-04 MED ORDER — PREDNISONE 50 MG PO TABS
60.0000 mg | ORAL_TABLET | Freq: Once | ORAL | Status: AC
  Administered 2016-08-04: 60 mg via ORAL
  Filled 2016-08-04: qty 1

## 2016-08-04 NOTE — ED Provider Notes (Signed)
[facility]-EMERGENCY DEPT Provider Note   CSN: [id number] Arrival date & time: 08/04/16  1824     History   Chief Complaint Chief Complaint  Patient presents with  . Shortness of Breath    HPI Vincent Robertson is a 65 y.o. male.  HPI  Pt was seen at 2000.  Per pt, c/o gradual onset and worsening of persistent cough, wheezing and SOB for the past 2 days, worse since waking up this morning at 0700/0730. Has been associated with bilateral upper chest wall "cramping pain."  Pt endorses mowing his lawn yesterday with a push mower before his symptoms began. Denies CP while mowing his lawn with the lawn mower.  Denies palpitations, no back pain, no abd pain, no N/V/D, no fevers, no rash.     Past Medical History:  Diagnosis Date  . CAP (community acquired pneumonia) 08/25/2014   With mild sepsis.  Marland Kitchen COPD (chronic obstructive pulmonary disease) (HCC)   . Cough   . 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 Mild mitral valve prolapse 08/26/2014  . Murmur   . Tobacco use   . Tricuspid regurgitation 08/26/2014   Mild-mod; Mild Pulm HTN  48 mmHg    Patient Active Problem List   Diagnosis Date Noted  . Protein-calorie malnutrition, severe (HCC) 09/30/2014  . Severe malnutrition (HCC) 08/27/2014  . 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 Tricuspid regurgitation 08/26/2014  . Anemia, unspecified 08/26/2014  . Mild mitral valve prolapse 08/26/2014  . Sepsis (HCC) 08/25/2014  . CAP (community acquired pneumonia) 08/25/2014  . Acute respiratory failure with hypoxia (HCC) 08/25/2014  . COPD exacerbation (HCC) 08/25/2014  . Systolic murmur 08/25/2014  . Fever, unspecified 08/25/2014  . Leukocytosis, unspecified 08/25/2014  . Hyponatremia 08/25/2014  . Dehydration 08/25/2014  . Hyperglycemia 08/25/2014  . Tobacco use disorder 08/25/2014    History reviewed. No pertinent surgical history.     Home Medications    Prior to Admission medications   Medication Sig Start Date End Date  Taking? Authorizing Provider  albuterol (PROVENTIL HFA;VENTOLIN HFA) 108 (90 BASE) MCG/ACT inhaler Inhale 2 puffs into the lungs 3 (three) times daily. 08/27/14  Yes Elliot Cousin, MD  albuterol (PROVENTIL) (2.5 MG/3ML) 0.083% nebulizer solution Take 3 mLs (2.5 mg total) by nebulization every 6 (six) hours as needed for wheezing. 10/01/14  Yes Erick Blinks, MD  ferrous sulfate 325 (65 FE) MG tablet Take 1 tablet (325 mg total) by mouth 2 (two) times daily with a meal. Patient taking differently: Take 325 mg by mouth daily.  08/27/14  Yes Elliot Cousin, MD  ibuprofen (ADVIL,MOTRIN) 800 MG tablet Take 800 mg by mouth every 8 (eight) hours as needed.   Yes Historical Provider, MD  mometasone-formoterol (DULERA) 100-5 MCG/ACT AERO Inhale 2 puffs into the lungs 2 (two) times daily.   Yes Historical Provider, MD    Family History History reviewed. No pertinent family history.  Social History Social History  Substance Use Topics  . Smoking status: Former Smoker    Packs/day: 0.00    Types: Cigarettes    Quit date: 09/08/2014  . Smokeless tobacco: Not on file  . Alcohol use No     Allergies   Review of patient's allergies indicates no known allergies.   Review of Systems Review of Systems ROS: Statement: All systems negative except as marked or noted in the HPI; Constitutional: Negative for fever and chills. ; ; Eyes: Negative for eye pain, redness and discharge. ; ; ENMT: Negative for ear pain, hoarseness, nasal  congestion, sinus pressure and sore throat. ; ; Cardiovascular: Negative for palpitations, diaphoresis, and peripheral edema. ; ; Respiratory: +cough, wheezing, SOB. Negative for stridor. ; ; Gastrointestinal: Negative for nausea, vomiting, diarrhea, abdominal pain, blood in stool, hematemesis, jaundice and rectal bleeding. . ; ; Genitourinary: Negative for dysuria, flank pain and hematuria. ; ; Musculoskeletal: +upper chest wall pain. Negative for back pain and neck pain. Negative for  swelling and trauma.; ; Skin: Negative for pruritus, rash, abrasions, blisters, bruising and skin lesion.; ; Neuro: Negative for headache, lightheadedness and neck stiffness. Negative for weakness, altered level of consciousness, altered mental status, extremity weakness, paresthesias, involuntary movement, seizure and syncope.       Physical Exam Updated Vital Signs BP 138/71 (BP Location: Left Arm)   Pulse (!) 52   Temp 98.1 F (36.7 C) (Oral)   Resp 18   Ht 5\' 4"  (1.626 m)   Wt 108 lb (49 kg)   SpO2 100%   BMI 18.54 kg/m   Physical Exam 2005: Physical examination:  Nursing notes reviewed; Vital signs and O2 SAT reviewed;  Constitutional: Well developed, Well nourished, Well hydrated, In no acute distress; Head:  Normocephalic, atraumatic; Eyes: EOMI, PERRL, No scleral icterus; ENMT: Mouth and pharynx normal, Mucous membranes moist; Neck: Supple, Full range of motion, No lymphadenopathy; Cardiovascular: Regular rate and rhythm, No gallop; Respiratory: Breath sounds diminished & equal bilaterally, faint scattered wheezes. No audible wheezing. Speaking full sentences with ease, Normal respiratory effort/excursion; Chest: +upper bilat anterior chest wall areas tender to palp. No rash, no deformity, no soft tissue crepitus. Movement normal; Abdomen: Soft, Nontender, Nondistended, Normal bowel sounds; Genitourinary: No CVA tenderness; Extremities: Pulses normal, No tenderness, No edema, No calf edema or asymmetry.; Neuro: AA&Ox3, Major CN grossly intact.  Speech clear. No gross focal motor or sensory deficits in extremities.; Skin: Color normal, Warm, Dry.   ED Treatments / Results  Labs (all labs ordered are listed, but only abnormal results are displayed)   EKG  EKG Interpretation  Date/Time:  Sunday August 04 2016 18:32:50 EDT Ventricular Rate:  72 PR Interval:  134 QRS Duration: 94 QT Interval:  404 QTC Calculation: 442 R Axis:   26 Text Interpretation:  Normal sinus rhythm  Possible Left atrial enlargement Borderline ECG When compared with ECG of 06/14/2015 No significant change was found Confirmed by [facility]CMANUS  MD, Nicholos JohnsKATHLEEN [phone]([id number]) on 08/04/2016 8:43:19 PM       Radiology   Procedures Procedures (including critical care time)  Medications Ordered in ED Medications  predniSONE (DELTASONE) tablet 60 mg (60 mg Oral Given 08/04/16 2025)  albuterol (PROVENTIL,VENTOLIN) solution continuous neb (10 mg/hr Nebulization Given 08/04/16 2022)  ipratropium (ATROVENT) nebulizer solution 1 mg (1 mg Nebulization Given 08/04/16 2022)     Initial Impression / Assessment and Plan / ED Course  I have reviewed the triage vital signs and the nursing notes.  Pertinent labs & imaging results that were available during my care of the patient were reviewed by me and considered in my medical decision making (see chart for details).  MDM Reviewed: previous chart, nursing note and vitals Reviewed previous: labs and ECG Interpretation: labs, ECG and x-ray   Results for orders placed or performed during the hospital encounter of 08/04/16  CBC  Result Value Ref Range   WBC 5.8 4.0 - 10.5 K/uL   RBC 4.59 4.22 - 5.81 MIL/uL   Hemoglobin 14.1 13.0 - 17.0 g/dL   HCT 84.642.3 96.239.0 - 95.252.0 %   MCV 92.2  78.0 - 100.0 fL   MCH 30.7 26.0 - 34.0 pg   MCHC 33.3 30.0 - 36.0 g/dL   RDW 21.3 08.6 - 57.8 %   Platelets 290 150 - 400 K/uL  Comprehensive metabolic panel  Result Value Ref Range   Sodium 135 135 - 145 mmol/L   Potassium 4.2 3.5 - 5.1 mmol/L   Chloride 106 101 - 111 mmol/L   CO2 26 22 - 32 mmol/L   Glucose, Bld 81 65 - 99 mg/dL   BUN 22 (H) 6 - 20 mg/dL   Creatinine, Ser 4.69 0.61 - 1.24 mg/dL   Calcium 8.7 (L) 8.9 - 10.3 mg/dL   Total Protein 7.0 6.5 - 8.1 g/dL   Albumin 4.3 3.5 - 5.0 g/dL   AST 39 15 - 41 U/L   ALT 28 17 - 63 U/L   Alkaline Phosphatase 89 38 - 126 U/L   Total Bilirubin 0.4 0.3 - 1.2 mg/dL   GFR calc non Af Amer >60 >60 mL/min   GFR calc Af Amer >60 >60  mL/min   Anion gap 3 (L) 5 - 15  Troponin I  Result Value Ref Range   Troponin I <0.03 <0.03 ng/mL   Dg Chest 2 View Result Date: 08/04/2016 CLINICAL DATA:  Chest pain, shortness of breath and dry cough. EXAM: CHEST  2 VIEW COMPARISON:  02/23/2016 FINDINGS: Cardiomediastinal silhouette is normal. Mediastinal contours appear intact. There is no evidence of focal airspace consolidation, pleural effusion or pneumothorax. Hyperinflation of the lungs with exaggerated interstitial markings, consistent with COPD. Osseous structures are without acute abnormality. Soft tissues are grossly normal. IMPRESSION: COPD. No evidence of acute process. Electronically Signed   By: Ted Mcalpine M.D.   On: 08/04/2016 18:50    2220:  Pt states he "feels better" after neb and steroid.  NAD, lungs CTA bilat, no wheezing, resps easy, speaking full sentences, Sats 98-100% R/A.  Pt ambulated around the ED with Sats remaining 93-97 % R/A, resps easy, NAD.  Pt states he wants to go home now. Doubt PE as cause for symptoms with low risk Wells.  Doubt ACS as cause for symptoms with normal troponin and unchanged EKG from previous after 12+ hours of constant/atypical symptoms. Tx COPD exacerbation at this time.  Dx and testing d/w pt.  Questions answered.  Verb understanding, agreeable to d/c home with outpt f/u.    Final Clinical Impressions(s) / ED Diagnoses   Final diagnoses:  None    New Prescriptions New Prescriptions   No medications on file     Samuel Jester, DO 08/08/16 2211

## 2016-08-04 NOTE — Discharge Instructions (Signed)
Take the prescriptions as directed.  Use your albuterol inhaler (2 to 4 puffs) or your albuterol nebulizer (1 unit dose) every 4 hours for the next 7 days, then as needed for cough, wheezing, or shortness of breath.  Call your regular medical doctor tomorrow morning to schedule a follow up appointment within the next 3 days.  Return to the Emergency Department immediately sooner if worsening.  ° °

## 2016-08-04 NOTE — ED Notes (Signed)
Patient ambulated around nursing station. Patient O2 sat ranged between 93 to 97 percent room air. Patient returned to room with no complaints at this time. Patient given Coke to drink.

## 2016-08-04 NOTE — ED Triage Notes (Signed)
Reports increasing shortness of breath for the past few days.  Worse today.  Mowed grass yesterday.  Non-productive cough.  Lung sounds clear but diminished in bases.

## 2016-12-27 DIAGNOSIS — E785 Hyperlipidemia, unspecified: Secondary | ICD-10-CM | POA: Diagnosis not present

## 2017-01-01 DIAGNOSIS — E785 Hyperlipidemia, unspecified: Secondary | ICD-10-CM | POA: Diagnosis not present

## 2017-01-22 ENCOUNTER — Telehealth: Payer: Self-pay

## 2017-01-22 NOTE — Telephone Encounter (Signed)
[phone]  PATIENT WAS REFERRED BY THE [facility] TO SCHEDULE A TCS, STATED HE DID NOT RECEIVE HIS LETTER

## 2017-01-29 ENCOUNTER — Telehealth: Payer: Self-pay

## 2017-01-29 NOTE — Telephone Encounter (Signed)
LMOM to call.

## 2017-01-29 NOTE — Telephone Encounter (Signed)
See separate triage.  

## 2017-02-04 NOTE — Telephone Encounter (Signed)
Gastroenterology Pre-Procedure Review  Request Date: 02/18/2017 Requesting Physician: Pearson GrippeJames Kim, MD  PATIENT REVIEW QUESTIONS: The patient responded to the following health history questions as indicated:    1. Diabetes Melitis: no 2. Joint replacements in the past 12 months: no 3. Major health problems in the past 3 months: no  4. Has an artificial valve or MVP: MPV 5. Has a defibrillator: no 6. Has been advised in past to take antibiotics in advance of a procedure like teeth cleaning: YES 7. Family history of colon cancer: no  8. Alcohol Use: no  9. History of sleep apnea: no  10. History of coronary artery or other vascular stents placed within the last 12 months: no    MEDICATIONS & ALLERGIES:    Patient reports the following regarding taking any blood thinners:   Plavix? no Aspirin? no Coumadin? no Brilinta? no Xarelto? no Eliquis? no Pradaxa? no Savaysa? no Effient? no  Patient confirms/reports the following medications:  Current Outpatient Prescriptions  Medication Sig Dispense Refill  . albuterol (PROVENTIL HFA;VENTOLIN HFA) 108 (90 BASE) MCG/ACT inhaler Inhale 2 puffs into the lungs 3 (three) times daily. 1 Inhaler 2  . albuterol (PROVENTIL) (2.5 MG/3ML) 0.083% nebulizer solution Take 3 mLs (2.5 mg total) by nebulization every 6 (six) hours as needed for wheezing. 75 mL 12  . ferrous sulfate 325 (65 FE) MG tablet Take 1 tablet (325 mg total) by mouth 2 (two) times daily with a meal. (Patient taking differently: Take 325 mg by mouth daily. )    . ibuprofen (ADVIL,MOTRIN) 800 MG tablet Take 800 mg by mouth every 8 (eight) hours as needed.    . lovastatin (MEVACOR) 20 MG tablet Take 20 mg by mouth at bedtime.    . mometasone-formoterol (DULERA) 100-5 MCG/ACT AERO Inhale 2 puffs into the lungs 2 (two) times daily.    . predniSONE (DELTASONE) 20 MG tablet Take 2 tablets (40 mg total) by mouth daily. (Patient not taking: Reported on 01/29/2017) 10 tablet 0   No current  facility-administered medications for this visit.     Patient confirms/reports the following allergies:  No Known Allergies  No orders of the defined types were placed in this encounter.   AUTHORIZATION INFORMATION Primary Insurance:  ID #:   Group #:  Pre-Cert / Auth required: Pre-Cert / Auth #:   Secondary Insurance: ID #:   Group #:  Pre-Cert / Auth required: Pre-Cert / Auth #:   SCHEDULE INFORMATION: Procedure has been scheduled as follows:  Date: 02/18/2017          Time:  9:30 AM Location: [facility] Short Stay  This Gastroenterology Pre-Precedure Review Form is being routed to the following provider(s): R. Roetta SessionsMichael Rourk, MD

## 2017-02-04 NOTE — Telephone Encounter (Signed)
Appropriate.

## 2017-02-05 ENCOUNTER — Other Ambulatory Visit: Payer: Self-pay

## 2017-02-05 DIAGNOSIS — Z1211 Encounter for screening for malignant neoplasm of colon: Secondary | ICD-10-CM

## 2017-02-05 MED ORDER — PEG 3350-KCL-NA BICARB-NACL 420 G PO SOLR: 4000.0000 mL | ORAL | 0 refills | Status: DC

## 2017-02-05 NOTE — Telephone Encounter (Signed)
Rx sent to pharmacy and instructions mailed to pt.  

## 2017-02-13 ENCOUNTER — Telehealth: Payer: Self-pay

## 2017-02-13 NOTE — Telephone Encounter (Signed)
Pt called to say he needs to reschedule his colonoscopy with Dr. Jena Gaussourk from 02/18/2017 to 03/26/2017 at 11:30 AM.  He had to move and had extra expenses.  I am updating his address and will mail new instructions for him.

## 2017-03-24 ENCOUNTER — Telehealth: Payer: Self-pay

## 2017-03-24 NOTE — Telephone Encounter (Signed)
Pt called office to cancel his TCS that is scheduled for 03/26/17. He said he just doesn't want to do it right now and said "I'm just a coward". Endo scheduler informed.  Doris, he said if you want to reschedule to a later date you can call him. Routing message to DS.

## 2017-03-24 NOTE — Telephone Encounter (Signed)
I tried to call pt and phone not working. Called emergency contact to get in touch with pt and have him call, I need to see if there has been any change in his meds since he was tiraged for the colonoscopy.

## 2017-03-25 NOTE — Telephone Encounter (Signed)
See separate note. Pt cancelled.  

## 2017-03-25 NOTE — Telephone Encounter (Signed)
I am mailing him a letter to call me when he is ready to schedule.

## 2017-03-26 ENCOUNTER — Ambulatory Visit (HOSPITAL_COMMUNITY): Admit: 2017-03-26 | Payer: MEDICAID | Admitting: Internal Medicine

## 2017-03-26 ENCOUNTER — Encounter (HOSPITAL_COMMUNITY): Payer: Self-pay

## 2017-03-26 SURGERY — COLONOSCOPY
Anesthesia: Moderate Sedation

## 2017-05-10 DIAGNOSIS — E785 Hyperlipidemia, unspecified: Secondary | ICD-10-CM | POA: Diagnosis not present

## 2017-05-10 DIAGNOSIS — J441 Chronic obstructive pulmonary disease with (acute) exacerbation: Secondary | ICD-10-CM | POA: Diagnosis not present

## 2017-05-10 DIAGNOSIS — R011 Cardiac murmur, unspecified: Secondary | ICD-10-CM | POA: Diagnosis not present

## 2017-05-10 DIAGNOSIS — R030 Elevated blood-pressure reading, without diagnosis of hypertension: Secondary | ICD-10-CM | POA: Diagnosis not present

## 2017-05-16 ENCOUNTER — Other Ambulatory Visit (HOSPITAL_COMMUNITY): Payer: Self-pay | Admitting: Internal Medicine

## 2017-05-16 DIAGNOSIS — R911 Solitary pulmonary nodule: Secondary | ICD-10-CM

## 2017-05-19 ENCOUNTER — Other Ambulatory Visit (HOSPITAL_COMMUNITY): Payer: Self-pay | Admitting: Respiratory Therapy

## 2017-05-19 DIAGNOSIS — J441 Chronic obstructive pulmonary disease with (acute) exacerbation: Secondary | ICD-10-CM

## 2017-05-22 ENCOUNTER — Telehealth (HOSPITAL_COMMUNITY): Payer: Self-pay | Admitting: *Deleted

## 2017-05-22 DIAGNOSIS — J441 Chronic obstructive pulmonary disease with (acute) exacerbation: Secondary | ICD-10-CM | POA: Diagnosis not present

## 2017-05-22 DIAGNOSIS — R454 Irritability and anger: Secondary | ICD-10-CM | POA: Diagnosis not present

## 2017-05-22 DIAGNOSIS — E785 Hyperlipidemia, unspecified: Secondary | ICD-10-CM | POA: Diagnosis not present
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
## 2017-05-22 NOTE — Telephone Encounter (Signed)
returned phone call regarding appointment. no answer, left voice message. 

## 2017-05-27 ENCOUNTER — Telehealth (HOSPITAL_COMMUNITY): Payer: Self-pay | Admitting: *Deleted

## 2017-05-27 ENCOUNTER — Ambulatory Visit (INDEPENDENT_AMBULATORY_CARE_PROVIDER_SITE_OTHER): Payer: Medicare Other | Admitting: Psychiatry

## 2017-05-27 ENCOUNTER — Encounter (HOSPITAL_COMMUNITY): Payer: Self-pay | Admitting: Psychiatry

## 2017-05-27 VITALS — BP 164/70 | HR 75 | Ht 64.0 in | Wt 99.0 lb

## 2017-05-27 DIAGNOSIS — F4323 Adjustment disorder with mixed anxiety and depressed mood: Secondary | ICD-10-CM

## 2017-05-27 DIAGNOSIS — Z791 Long term (current) use of non-steroidal anti-inflammatories (NSAID): Secondary | ICD-10-CM | POA: Diagnosis not present

## 2017-05-27 DIAGNOSIS — I341 Nonrheumatic mitral (valve) prolapse: Secondary | ICD-10-CM | POA: Diagnosis not present

## 2017-05-27 DIAGNOSIS — Z87820 Personal history of traumatic brain injury: Secondary | ICD-10-CM

## 2017-05-27 DIAGNOSIS — J449 Chronic obstructive pulmonary disease, unspecified: Secondary | ICD-10-CM

## 2017-05-27 DIAGNOSIS — F4320 Adjustment disorder, unspecified: Secondary | ICD-10-CM | POA: Insufficient documentation

## 2017-05-27 DIAGNOSIS — Z811 Family history of alcohol abuse and dependence: Secondary | ICD-10-CM

## 2017-05-27 DIAGNOSIS — F17210 Nicotine dependence, cigarettes, uncomplicated: Secondary | ICD-10-CM

## 2017-05-27 DIAGNOSIS — Z7951 Long term (current) use of inhaled steroids: Secondary | ICD-10-CM | POA: Diagnosis not present

## 2017-05-27 DIAGNOSIS — Z79899 Other long term (current) drug therapy: Secondary | ICD-10-CM

## 2017-05-27 MED ORDER — ARIPIPRAZOLE 2 MG PO TABS
2.0000 mg | ORAL_TABLET | Freq: Every day | ORAL | 1 refills | Status: DC
Start: 2017-05-27 — End: 2017-06-23

## 2017-05-27 MED ORDER — SERTRALINE HCL 50 MG PO TABS: ORAL_TABLET | ORAL | 1 refills | Status: DC

## 2017-05-27 NOTE — Telephone Encounter (Signed)
Before pt left appt, he wanted to ask provider a question. Per pt, he would like to know if Dr. Vanetta ShawlHisada could write him a letter for him to take to his Landlord. Per pt, if Dr. Vanetta ShawlHisada thinks that being active and going out is healthy for his mental health, he would like a letter stating that do he can take it to his landlord so he won't be stuck in his one year least. Per pt, he would like to go back to his family. Per pt he would like to go back to live with the woman he was living with and not stuck in his 2 bedroom apartment . Per pt at his apartment right now, he has no tv, no radio, no wi-fi, no phone nothing for him to be active. Per pt he do not even have a car. Per pt, would like a call back from Dr. Vanetta ShawlHisada so he can go into more details with her. Pt number is [phone]. Per pt, his rent is due on the 3rd and if it is okay for him to leave his apartment.

## 2017-05-27 NOTE — Telephone Encounter (Signed)
Discussed with the patient. I believe that the it would not be appropriate to provide a letter given unknown circumstances. Patient understands it.

## 2017-05-27 NOTE — Patient Instructions (Signed)
1. Start sertraline 25 mg daily for two weeks, then 50 mg daily 2. Continue Abilify 2 mg daily 3. Return to clinic in one month for 30 mins 4. Referral to therapy

## 2017-05-28 ENCOUNTER — Ambulatory Visit (HOSPITAL_COMMUNITY)
Admission: RE | Admit: 2017-05-28 | Discharge: 2017-05-28 | Disposition: A | Discharge status: Home / Self Care | Payer: Medicare Other | Source: Ambulatory Visit | Attending: Internal Medicine | Admitting: Internal Medicine

## 2017-05-28 DIAGNOSIS — J441 Chronic obstructive pulmonary disease with (acute) exacerbation: Secondary | ICD-10-CM | POA: Insufficient documentation

## 2017-05-28 LAB — SPIROMETRY WITH GRAPH
FEF 25-75 Pre: 0.45 L/sec
FEF2575-%Pred-Pre: 20 %
FEV1-%Pred-Pre: 39 %
FEV1-Pre: 1.07 L
FEV1FVC-%Pred-Pre: 58 %
FEV6-%PRED-PRE: 69 %
FEV6-Pre: 2.35 L
FEV6FVC-%PRED-PRE: 104 %
FVC-%PRED-PRE: 66 %
FVC-PRE: 2.42 L
PRE FEV1/FVC RATIO: 44 %
PRE FEV6/FVC RATIO: 98 %

## 2017-05-28 NOTE — Telephone Encounter (Signed)
noted 

## 2017-05-29 ENCOUNTER — Ambulatory Visit (HOSPITAL_COMMUNITY)
Admission: RE | Admit: 2017-05-29 | Discharge: 2017-05-29 | Disposition: A | Discharge status: Home / Self Care | Payer: Medicare Other | Source: Ambulatory Visit | Attending: Internal Medicine | Admitting: Internal Medicine

## 2017-05-29 DIAGNOSIS — J439 Emphysema, unspecified: Secondary | ICD-10-CM | POA: Insufficient documentation

## 2017-05-29 DIAGNOSIS — I700 Atherosclerosis of aorta: Secondary | ICD-10-CM | POA: Insufficient documentation

## 2017-05-29 DIAGNOSIS — R911 Solitary pulmonary nodule: Secondary | ICD-10-CM | POA: Insufficient documentation

## 2017-05-29 DIAGNOSIS — N200 Calculus of kidney: Secondary | ICD-10-CM | POA: Diagnosis not present

## 2017-06-03 ENCOUNTER — Telehealth (HOSPITAL_COMMUNITY): Payer: Self-pay | Admitting: *Deleted

## 2017-06-03 ENCOUNTER — Ambulatory Visit (INDEPENDENT_AMBULATORY_CARE_PROVIDER_SITE_OTHER): Payer: Medicare Other | Admitting: Licensed Clinical Social Worker

## 2017-06-03 DIAGNOSIS — F4325 Adjustment disorder with mixed disturbance of emotions and conduct: Secondary | ICD-10-CM | POA: Diagnosis not present

## 2017-06-03 DIAGNOSIS — Z630 Problems in relationship with spouse or partner: Secondary | ICD-10-CM | POA: Diagnosis not present

## 2017-06-03 NOTE — Telephone Encounter (Signed)
returned phone call, no answer, left voice message. 

## 2017-06-04 ENCOUNTER — Encounter (HOSPITAL_COMMUNITY): Payer: Self-pay | Admitting: Licensed Clinical Social Worker

## 2017-06-04 DIAGNOSIS — N200 Calculus of kidney: Secondary | ICD-10-CM | POA: Diagnosis not present

## 2017-06-04 DIAGNOSIS — J984 Other disorders of lung: Secondary | ICD-10-CM | POA: Diagnosis not present

## 2017-06-04 DIAGNOSIS — M545 Low back pain: Secondary | ICD-10-CM | POA: Diagnosis not present

## 2017-06-04 DIAGNOSIS — J449 Chronic obstructive pulmonary disease, unspecified: Secondary | ICD-10-CM | POA: Diagnosis not present

## 2017-06-04 DIAGNOSIS — I700 Atherosclerosis of aorta: Secondary | ICD-10-CM | POA: Diagnosis not present
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
## 2017-06-05 ENCOUNTER — Other Ambulatory Visit (HOSPITAL_COMMUNITY): Payer: Self-pay | Admitting: Respiratory Therapy

## 2017-06-05 DIAGNOSIS — J449 Chronic obstructive pulmonary disease, unspecified: Secondary | ICD-10-CM

## 2017-06-17 ENCOUNTER — Encounter (HOSPITAL_COMMUNITY): Payer: Self-pay | Admitting: Licensed Clinical Social Worker

## 2017-06-17 ENCOUNTER — Ambulatory Visit (INDEPENDENT_AMBULATORY_CARE_PROVIDER_SITE_OTHER): Payer: Medicare Other | Admitting: Licensed Clinical Social Worker

## 2017-06-17 DIAGNOSIS — F4325 Adjustment disorder with mixed disturbance of emotions and conduct: Secondary | ICD-10-CM | POA: Diagnosis not present
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
## 2017-06-19 NOTE — Progress Notes (Signed)
BH MD/PA/NP OP Progress Note  06/23/2017 2:13 PM MAITLAND LESIAK  MRN:  [id number]  Chief Complaint:  Chief Complaint    Depression; Follow-up     Subjective:  "I don't like the "feeling"" HPI:  Patient presents for follow up appointment for adjustment disorder. He states that he feels less irritable since the last appointment. He moved back to the house with his ex-wife. He tries to work on house chores to help his ex-wife, thinking that it would help for conflict between each other. He believes that he has not acted on his anger outburst, and it becomes less. He wonders that he might be pushing her away (emotionally) so that he won't get hurt by losing her. He talks about bereavement from loss of his wife, daughter and his father. She reports less insomnia since starting melatonin. He has more motivation to do things, although he has occasional fatigue. He denies anhedonia. He denies SI, HI, AH, VH. He feels anxious at times. He denies panic attacks. He states that Abilify was increased by his PCP; he does not care about the dose he takes as long as he can get his ex-wife back.  Visit Diagnosis:    ICD-10-CM   1. 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  Outpatient: at [facility] clinic before Psychiatry admission: [facility] twice in 2016 for SI ("lied" for his anger outburst to stay away from people) Previous suicide attempt: denies Past trials of medication: lamotrigine, mirtazapine, Abilify History of violence: denies  Past Medical History:  Past Medical History:  Diagnosis Date  . CAP (community acquired pneumonia) 08/25/2014   With mild sepsis.  Marland Kitchen COPD (chronic obstructive pulmonary disease) (HCC)   . Cough   . 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 Mild mitral valve prolapse 08/26/2014  . Murmur   . Tobacco use    . Tricuspid regurgitation 08/26/2014   Mild-mod; Mild Pulm HTN  48 mmHg   No past surgical history on file.  Family Psychiatric History:  Mother- alcohol use, maternal uncle- alcohol use,   Family History: No family history on file.  Social History:  Social History   Social History  . Marital status: Single    Spouse name: N/A  . Number of children: N/A  . Years of education: N/A   Social History Main Topics  . Smoking status: Current Some Day Smoker    Packs/day: 0.00    Types: Cigarettes    Last attempt to quit: 09/08/2014  . Smokeless tobacco: Never Used  . Alcohol use No     Comment: 05-27-2017 per pt Rarely  . Drug use: No     Comment: 05-27-2017 per pt stopped Marijuana at 66 years old  . Sexual activity: Not Asked   Other Topics Concern  . None   Social History Narrative  . None   Military: served in [employer] until 1972, no combat experience Lives by himself, he has two children in [place]. Married three times, divorced twice and the third wife deceased Education: 12 th grade  Work: used to work at [employer], and Forensic scientist, used to Scientist, clinical (histocompatibility and immunogenetics) for COPD Legal: some charge for broke the [employer] and raised in [place], spent 30 years in [place], he reports "fantastic" childhood, reports "buddy" relationship with his mother, three sisters,   Allergies: No Known Allergies  Metabolic  Disorder Labs: Lab Results  Component Value Date   HGBA1C 5.6 08/25/2014   MPG 114 08/25/2014   No results found for: PROLACTIN No results found for: CHOL, TRIG, HDL, CHOLHDL, VLDL, LDLCALC   Current Medications: Current Outpatient Prescriptions  Medication Sig Dispense Refill  . albuterol (PROVENTIL HFA;VENTOLIN HFA) 108 (90 BASE) MCG/ACT inhaler Inhale 2 puffs into the lungs 3 (three) times daily. 1 Inhaler 2  . albuterol (PROVENTIL) (2.5 MG/3ML) 0.083% nebulizer solution Take 3 mLs (2.5 mg total) by nebulization every 6 (six) hours as needed for wheezing. 75 mL  12  . ARIPiprazole (ABILIFY) 5 MG tablet Take 1 tablet (5 mg total) by mouth daily. 30 tablet 1  . ferrous sulfate 325 (65 FE) MG tablet Take 1 tablet (325 mg total) by mouth 2 (two) times daily with a meal. (Patient taking differently: Take 325 mg by mouth daily. )    . ibuprofen (ADVIL,MOTRIN) 800 MG tablet Take 800 mg by mouth every 8 (eight) hours as needed.    . IRON PO Take 50 mg by mouth daily.    Marland Kitchen. lovastatin (MEVACOR) 20 MG tablet Take 20 mg by mouth at bedtime.    Marland Kitchen. MELATONIN PO Take by mouth at bedtime.    . mometasone-formoterol (DULERA) 100-5 MCG/ACT AERO Inhale 2 puffs into the lungs 2 (two) times daily.    . sertraline (ZOLOFT) 50 MG tablet Take 1 tablet (50 mg total) by mouth daily. 30 tablet 1   No current facility-administered medications for this visit.     Neurologic: Headache: No Seizure: No Paresthesias: No  Musculoskeletal: Strength & Muscle Tone: within normal limits Gait & Station: normal Patient leans: N/A  Psychiatric Specialty Exam: Review of Systems  Psychiatric/Behavioral: Negative for depression, hallucinations, substance abuse and suicidal ideas. The patient is nervous/anxious. The patient does not have insomnia.   All other systems reviewed and are negative.   Blood pressure (!) 142/92, pulse 71, height 5\' 4"  (1.626 m), weight 102 lb 4.8 oz (46.4 kg).Body mass index is 17.56 kg/m.  General Appearance: Fairly Groomed  Eye Contact:  Good  Speech:  Clear and Coherent  Volume:  Normal  Mood:  not so bad   Affect:  Appropriate and Congruent  Thought Process:  Coherent, derailment at times  Orientation:  Full (Time, Place, and Person)  Thought Content: Logical Perceptions: denies AH/VH  Suicidal Thoughts:  No  Homicidal Thoughts:  No  Memory:  Immediate;   Good Recent;   Good Remote;   Good  Judgement:  Fair  Insight:  Fair  Psychomotor Activity:  Normal  Concentration:  Concentration: Good and Attention Span: Good  Recall:  Good  Fund of  Knowledge: Good  Language: Good  Akathisia:  No  Handed:  Right  AIMS (if indicated):  No tremors  Assets:  Communication Skills Desire for Improvement  ADL's:  Intact  Cognition: WNL  Sleep:  fair   Assessment Carlton AdamLawrence E Mcglasson is a 66 y.o. year old male with a history of depression, COPD, MVP, who presents for follow up appointment for Adjustment disorder with mixed disturbance of emotions and conduct  # Adjustment disorder with anxiety and depressed mood Exam is notable for his continued derailment, although patient appears to have less rumination on his wife. Will continue sertraline to target his mood, and Abilify as adjunctive treatment and also to target his anger outburst (this was uptitrated by his PCP to 5 mg per patient report). Discussed in length regarding vulnerability, fear of  abandonment and secondary emotion of anger. Patient is encouraged to continue work on with his therapist.   Plan 1. Continue sertraline 50 mg daily 2. Continue Abilify 5 mg daily 3. Return clinic in two months for 30 mins  The patient demonstrates the following risk factors for suicide: Chronic risk factors for suicide include: psychiatric disorder of depression. Acute risk factors for suicide include: family or marital conflict, unemployment and social withdrawal/isolation. Protective factors for this patient include: coping skills and hope for the future. Considering these factors, the overall suicide risk at this point appears to be low. Patient is appropriate for outpatient follow up.   Treatment Plan Summary:Plan as above  The duration of this appointment visit was 30 minutes of face-to-face time with the patient.  Greater than 50% of this time was spent in counseling, explanation of  diagnosis, planning of further management, and coordination of care.  Neysa Hotter, MD 06/23/2017, 2:13 PM

## 2017-06-23 ENCOUNTER — Ambulatory Visit (INDEPENDENT_AMBULATORY_CARE_PROVIDER_SITE_OTHER): Payer: Medicare Other | Admitting: Psychiatry

## 2017-06-23 ENCOUNTER — Encounter (HOSPITAL_COMMUNITY): Payer: Self-pay | Admitting: Psychiatry

## 2017-06-23 VITALS — BP 142/92 | HR 71 | Ht 64.0 in | Wt 102.3 lb

## 2017-06-23 DIAGNOSIS — F17210 Nicotine dependence, cigarettes, uncomplicated: Secondary | ICD-10-CM

## 2017-06-23 DIAGNOSIS — F4323 Adjustment disorder with mixed anxiety and depressed mood: Secondary | ICD-10-CM

## 2017-06-23 DIAGNOSIS — I341 Nonrheumatic mitral (valve) prolapse: Secondary | ICD-10-CM | POA: Diagnosis not present

## 2017-06-23 DIAGNOSIS — F4325 Adjustment disorder with mixed disturbance of emotions and conduct: Secondary | ICD-10-CM

## 2017-06-23 DIAGNOSIS — Z811 Family history of alcohol abuse and dependence: Secondary | ICD-10-CM

## 2017-06-23 DIAGNOSIS — J449 Chronic obstructive pulmonary disease, unspecified: Secondary | ICD-10-CM | POA: Diagnosis not present

## 2017-06-23 MED ORDER — ARIPIPRAZOLE 5 MG PO TABS: 5.0000 mg | ORAL_TABLET | Freq: Every day | ORAL | 1 refills | Status: DC

## 2017-06-23 MED ORDER — SERTRALINE HCL 50 MG PO TABS: 50.0000 mg | ORAL_TABLET | Freq: Every day | ORAL | 1 refills | Status: DC

## 2017-06-23 NOTE — Patient Instructions (Signed)
1. Continue sertraline 50 mg daily 2. Continue Abilify 5 mg daily 3. Return clinic in two months for 30 mins

## 2017-06-24 DIAGNOSIS — R030 Elevated blood-pressure reading, without diagnosis of hypertension: Secondary | ICD-10-CM | POA: Diagnosis not present

## 2017-06-24 DIAGNOSIS — R011 Cardiac murmur, unspecified: Secondary | ICD-10-CM | POA: Diagnosis not present

## 2017-06-24 DIAGNOSIS — E785 Hyperlipidemia, unspecified: Secondary | ICD-10-CM | POA: Diagnosis not present

## 2017-06-24 DIAGNOSIS — J441 Chronic obstructive pulmonary disease with (acute) exacerbation: Secondary | ICD-10-CM | POA: Diagnosis not present

## 2017-06-25 ENCOUNTER — Other Ambulatory Visit (HOSPITAL_COMMUNITY): Payer: Self-pay | Admitting: Family Medicine

## 2017-06-25 ENCOUNTER — Ambulatory Visit (HOSPITAL_COMMUNITY)
Admission: RE | Admit: 2017-06-25 | Discharge: 2017-06-25 | Disposition: A | Discharge status: Home / Self Care | Payer: Medicare Other | Source: Ambulatory Visit | Attending: Family Medicine | Admitting: Family Medicine

## 2017-06-25 DIAGNOSIS — M4856XA Collapsed vertebra, not elsewhere classified, lumbar region, initial encounter for fracture: Secondary | ICD-10-CM | POA: Diagnosis not present

## 2017-06-25 DIAGNOSIS — M545 Low back pain: Secondary | ICD-10-CM

## 2017-06-25 DIAGNOSIS — J449 Chronic obstructive pulmonary disease, unspecified: Secondary | ICD-10-CM | POA: Diagnosis not present

## 2017-06-25 DIAGNOSIS — M25561 Pain in right knee: Secondary | ICD-10-CM | POA: Insufficient documentation

## 2017-06-25 DIAGNOSIS — R454 Irritability and anger: Secondary | ICD-10-CM | POA: Diagnosis not present

## 2017-06-25 DIAGNOSIS — E785 Hyperlipidemia, unspecified: Secondary | ICD-10-CM | POA: Diagnosis not present

## 2017-06-25 DIAGNOSIS — S8991XA Unspecified injury of right lower leg, initial encounter: Secondary | ICD-10-CM | POA: Diagnosis not present

## 2017-07-02 ENCOUNTER — Ambulatory Visit (INDEPENDENT_AMBULATORY_CARE_PROVIDER_SITE_OTHER): Payer: Medicare Other | Admitting: Licensed Clinical Social Worker

## 2017-07-02 ENCOUNTER — Ambulatory Visit (HOSPITAL_COMMUNITY)
Admission: RE | Admit: 2017-07-02 | Discharge: 2017-07-02 | Disposition: A | Discharge status: Home / Self Care | Payer: Medicare Other | Source: Ambulatory Visit | Attending: Family Medicine | Admitting: Family Medicine

## 2017-07-02 ENCOUNTER — Encounter (HOSPITAL_COMMUNITY): Payer: Self-pay | Admitting: Licensed Clinical Social Worker

## 2017-07-02 DIAGNOSIS — J449 Chronic obstructive pulmonary disease, unspecified: Secondary | ICD-10-CM | POA: Insufficient documentation

## 2017-07-02 DIAGNOSIS — Z87891 Personal history of nicotine dependence: Secondary | ICD-10-CM | POA: Insufficient documentation

## 2017-07-02 DIAGNOSIS — F4325 Adjustment disorder with mixed disturbance of emotions and conduct: Secondary | ICD-10-CM | POA: Diagnosis not present

## 2017-07-02 DIAGNOSIS — R942 Abnormal results of pulmonary function studies: Secondary | ICD-10-CM | POA: Diagnosis not present

## 2017-07-02 DIAGNOSIS — Z630 Problems in relationship with spouse or partner: Secondary | ICD-10-CM | POA: Diagnosis not present

## 2017-07-02 LAB — PULMONARY FUNCTION TEST
DL/VA % PRED: 42 %
DL/VA: 1.8 ml/min/mmHg/L
DLCO COR: 8.13 ml/min/mmHg
DLCO cor % pred: 33 %
DLCO unc % pred: 33 %
DLCO unc: 8.13 ml/min/mmHg
FEF 25-75 Pre: 0.44 L/sec
FEF2575-%PRED-PRE: 20 %
FEV1-%Pred-Pre: 38 %
FEV1-Pre: 1.02 L
FEV1FVC-%Pred-Pre: 60 %
FEV6-%Pred-Pre: 66 %
FEV6-PRE: 2.27 L
FEV6FVC-%Pred-Pre: 105 %
FVC-%PRED-PRE: 63 %
FVC-Pre: 2.3 L
PRE FEV6/FVC RATIO: 99 %
Pre FEV1/FVC ratio: 45 %
RV % pred: 207 %
RV: 4.21 L
TLC % pred: 121 %
TLC: 7.03 L
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
## 2017-07-07 DIAGNOSIS — E785 Hyperlipidemia, unspecified: Secondary | ICD-10-CM | POA: Diagnosis not present

## 2017-07-07 DIAGNOSIS — J449 Chronic obstructive pulmonary disease, unspecified: Secondary | ICD-10-CM | POA: Diagnosis not present

## 2017-07-07 DIAGNOSIS — E559 Vitamin D deficiency, unspecified: Secondary | ICD-10-CM | POA: Diagnosis not present

## 2017-07-07 DIAGNOSIS — R011 Cardiac murmur, unspecified: Secondary | ICD-10-CM | POA: Diagnosis not present

## 2017-07-07 DIAGNOSIS — R454 Irritability and anger: Secondary | ICD-10-CM | POA: Diagnosis not present

## 2017-07-30 ENCOUNTER — Ambulatory Visit (INDEPENDENT_AMBULATORY_CARE_PROVIDER_SITE_OTHER): Payer: Medicare Other | Admitting: Psychiatry

## 2017-07-30 ENCOUNTER — Encounter (HOSPITAL_COMMUNITY): Payer: Self-pay | Admitting: Psychiatry

## 2017-07-30 VITALS — BP 142/75 | HR 78 | Ht 64.0 in | Wt 103.8 lb

## 2017-07-30 DIAGNOSIS — I341 Nonrheumatic mitral (valve) prolapse: Secondary | ICD-10-CM | POA: Diagnosis not present

## 2017-07-30 DIAGNOSIS — F4325 Adjustment disorder with mixed disturbance of emotions and conduct: Secondary | ICD-10-CM

## 2017-07-30 DIAGNOSIS — G4700 Insomnia, unspecified: Secondary | ICD-10-CM

## 2017-07-30 DIAGNOSIS — J449 Chronic obstructive pulmonary disease, unspecified: Secondary | ICD-10-CM

## 2017-07-30 DIAGNOSIS — F419 Anxiety disorder, unspecified: Secondary | ICD-10-CM

## 2017-07-30 DIAGNOSIS — F17210 Nicotine dependence, cigarettes, uncomplicated: Secondary | ICD-10-CM | POA: Diagnosis not present

## 2017-07-30 MED ORDER — SERTRALINE HCL 100 MG PO TABS: 100.0000 mg | ORAL_TABLET | Freq: Every day | ORAL | 1 refills | Status: DC

## 2017-07-30 NOTE — Progress Notes (Signed)
BH MD/PA/NP OP Progress Note  07/30/2017 2:23 PM Vincent Robertson  MRN:  [id number]  Chief Complaint:  Chief Complaint    Follow-up; Depression     HPI: "my temper almost got messed up" This appointment was made urgently upon patient request. He states that he came here as he had worsening irritability this morning. He states that he should not have lived with his girlfriend. He states that his girlfriend's granddaughter called his name, which made him severely upset. He left the scene so that he will not act on it. He denies any violence or denies any yelling. He talks about frustration toward his girlfriend. He reports that he has occasional insomnia due to his cats/dogs. He feels fatigue at times. He denies SI, HI, AH/VH. He feels anxious. He denies panic attacks.   Visit Diagnosis:    ICD-10-CM   1. 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 Outpatient: at [facility] clinic before Psychiatry admission: [facility] twice in 2016 for SI ("lied" for his anger outburst to stay away from people) Previous suicide attempt: denies Past trials of medication: lamotrigine, mirtazapine, Abilify History of violence: denies  Past Medical History:  Past Medical History:  Diagnosis Date  . CAP (community acquired pneumonia) 08/25/2014   With mild sepsis.  Marland Kitchen COPD (chronic obstructive pulmonary disease) (HCC)   . Cough   . 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 Mild mitral valve prolapse 08/26/2014  . Murmur   . Tobacco use   . Tricuspid regurgitation 08/26/2014   Mild-mod; Mild Pulm HTN  48 mmHg   No past surgical history on file.  Family Psychiatric History:  I have reviewed the patient's family history in detail and updated the patient record.  Family History: No family history on file.  Social History:  Social History    Social History  . Marital status: Single    Spouse name: N/A  . Number of children: N/A  . Years of education: N/A   Social History Main Topics  . Smoking status: Current Some Day Smoker    Packs/day: 0.00    Types: Cigarettes    Last attempt to quit: 09/08/2014  . Smokeless tobacco: Never Used  . Alcohol use No     Comment: 05-27-2017 per pt Rarely  . Drug use: No     Comment: 05-27-2017 per pt stopped Marijuana at 66 years old  . Sexual activity: Not Asked   Other Topics Concern  . None   Social History Narrative  . None   Military: served in [employer] until 1972, no combat experience Lives by himself, he has two children in [place]. Married three times, divorced twice and the third wife deceased Education: 12 th grade  Work: used to work at [employer], and Forensic scientist, used to Scientist, clinical (histocompatibility and immunogenetics) for COPD Legal: some charge for breaking the [employer] and raised in [place], spent 30 years in [place], he reports "fantastic" childhood, reports "buddy" relationship with his mother, three sisters,   Allergies: No Known Allergies  Metabolic Disorder Labs: Lab Results  Component Value Date   HGBA1C 5.6 08/25/2014   MPG 114 08/25/2014   No results found for: PROLACTIN No results found for: CHOL, TRIG, HDL, CHOLHDL, VLDL, LDLCALC Lab Results  Component Value Date   TSH 1.740 08/25/2014    Therapeutic Level Labs: No results found for:  LITHIUM No results found for: VALPROATE No components found for:  CBMZ  Current Medications: Current Outpatient Prescriptions  Medication Sig Dispense Refill  . albuterol (PROVENTIL HFA;VENTOLIN HFA) 108 (90 BASE) MCG/ACT inhaler Inhale 2 puffs into the lungs 3 (three) times daily. 1 Inhaler 2  . albuterol (PROVENTIL) (2.5 MG/3ML) 0.083% nebulizer solution Take 3 mLs (2.5 mg total) by nebulization every 6 (six) hours as needed for wheezing. 75 mL 12  . ARIPiprazole (ABILIFY) 5 MG tablet Take 1 tablet (5 mg total) by mouth daily. 30  tablet 1  . ferrous sulfate 325 (65 FE) MG tablet Take 1 tablet (325 mg total) by mouth 2 (two) times daily with a meal. (Patient taking differently: Take 325 mg by mouth daily. )    . ibuprofen (ADVIL,MOTRIN) 800 MG tablet Take 800 mg by mouth every 8 (eight) hours as needed.    . IRON PO Take 50 mg by mouth daily.    Marland Kitchen lovastatin (MEVACOR) 20 MG tablet Take 20 mg by mouth at bedtime.    Marland Kitchen MELATONIN PO Take by mouth at bedtime.    . mometasone-formoterol (DULERA) 100-5 MCG/ACT AERO Inhale 2 puffs into the lungs 2 (two) times daily.    . sertraline (ZOLOFT) 100 MG tablet Take 1 tablet (100 mg total) by mouth daily. 30 tablet 1   No current facility-administered medications for this visit.      Musculoskeletal: Strength & Muscle Tone: within normal limits Gait & Station: normal Patient leans: N/A  Psychiatric Specialty Exam: Review of Systems  Psychiatric/Behavioral: Negative for depression, hallucinations, substance abuse and suicidal ideas. The patient is nervous/anxious and has insomnia.   All other systems reviewed and are negative.   Blood pressure (!) 142/75, pulse 78, height 5\' 4"  (1.626 m), weight 103 lb 12.8 oz (47.1 kg).Body mass index is 17.82 kg/m.  General Appearance: Fairly Groomed  Eye Contact:  Good  Speech:  Clear and Coherent  Volume:  Normal  Mood:  Anxious  Affect:  Appropriate, Congruent and Constricted  Thought Process:  Coherent  Orientation:  Full (Time, Place, and Person)  Thought Content: Logical Perceptions: denies AH/VH  Suicidal Thoughts:  No  Homicidal Thoughts:  No  Memory:  Immediate;   Good Recent;   Good Remote;   Good  Judgement:  Fair  Insight:  Shallow  Psychomotor Activity:  Normal  Concentration:  Concentration: Good and Attention Span: Good  Recall:  Good  Fund of Knowledge: Good  Language: Good  Akathisia:  No  Handed:  Right  AIMS (if indicated): not done  Assets:  Communication Skills Desire for Improvement  ADL's:  Intact   Cognition: WNL  Sleep:  Poor   Screenings:   Assessment and Plan:  Vincent Robertson is a 66 y.o. year old male with a history of anxiety, depression, COPD, MVP, who presents for follow up appointment for Adjustment disorder with mixed disturbance of emotions and conduct  # Adjustment disorder with anxiety and depressed mood This appointment was made urgently as he had an episode of significant irritability. Will uptitrate sertraline to target his mood. Will continue Abilify as adjunctive treatment for depression and his anger outburst. 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   Plan 1. Continue sertraline 100 mg daily 2. Continue Abilify 5 mg daily 3. Keep the appointment   The patient demonstrates the following risk factors for suicide: Chronic risk factors for suicide  include: psychiatric disorder of depression. Acute risk factorsfor suicide include: family or marital conflict, unemployment and social withdrawal/isolation. Protective factorsfor this patient include: coping skills and hope for the future. Considering these factors, the overall suicide risk at this point appears to be low. Patient isappropriate for outpatient follow up.  Neysa Hotter, MD 07/30/2017, 2:23 PM

## 2017-07-30 NOTE — Patient Instructions (Signed)
1. Continue sertraline 100 mg daily 2. Continue Abilify 5 mg daily 3. Keep the appointment

## 2017-08-01 ENCOUNTER — Ambulatory Visit (INDEPENDENT_AMBULATORY_CARE_PROVIDER_SITE_OTHER): Payer: Medicare Other | Admitting: Licensed Clinical Social Worker

## 2017-08-01 DIAGNOSIS — F4325 Adjustment disorder with mixed disturbance of emotions and conduct: Secondary | ICD-10-CM | POA: Diagnosis not present
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
## 2017-08-04 DIAGNOSIS — J441 Chronic obstructive pulmonary disease with (acute) exacerbation: Secondary | ICD-10-CM | POA: Diagnosis not present

## 2017-08-04 DIAGNOSIS — E785 Hyperlipidemia, unspecified: Secondary | ICD-10-CM | POA: Diagnosis not present

## 2017-08-04 DIAGNOSIS — I340 Nonrheumatic mitral (valve) insufficiency: Secondary | ICD-10-CM | POA: Diagnosis not present

## 2017-08-04 DIAGNOSIS — R454 Irritability and anger: Secondary | ICD-10-CM | POA: Diagnosis not present

## 2017-08-04 DIAGNOSIS — M545 Low back pain: Secondary | ICD-10-CM | POA: Diagnosis not present

## 2017-08-14 NOTE — Progress Notes (Signed)
BH MD/PA/NP OP Progress Note  08/18/2017 2:00 PM Vincent Robertson  MRN:  [id number]  Chief Complaint:  Chief Complaint    Follow-up; Depression     HPI:  Patient presents for follow up appointment for depression. He states that he continues to have discordance with his wife. He left the house once and stayed at the hotel, feeling upset. Although he did not react on his emotion and denies any yelling or violence, his wife appears to be frustrated by the way he does. He still wants to be with her and feels anxious when he is by himself. He talks about his wife's granddaughter who was born within two weeks. He denies feeling depressed. He denies irritability. He denies fatigue. He denies difficulty with concentration. He has increased appetite (weigh unchanged). He denies panic attacks.    Wt Readings from Last 3 Encounters:  08/18/17 103 lb 6.4 oz (46.9 kg)  07/30/17 103 lb 12.8 oz (47.1 kg)  06/23/17 102 lb 4.8 oz (46.4 kg)    Visit Diagnosis:    ICD-10-CM   1. 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 Outpatient: at [facility] clinic before Psychiatry admission: [facility] twice in 2016 for SI ("lied" for his anger outburst to stay away from people) Previous suicide attempt: denies Past trials of medication: lamotrigine, mirtazapine, Abilify History of violence: denies  Past Medical History:  Past Medical History:  Diagnosis Date  . CAP (community acquired pneumonia) 08/25/2014   With mild sepsis.  Marland Kitchen COPD (chronic obstructive pulmonary disease) (HCC)   . Cough   . 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 Mild mitral valve prolapse 08/26/2014  . Murmur   . Tobacco use   . Tricuspid regurgitation 08/26/2014   Mild-mod; Mild Pulm HTN  48 mmHg   No past surgical history on file.  Family Psychiatric History:  I have  reviewed the patient's family history in detail and updated the patient record.  Family History: No family history on file.  Social History:  Social History   Social History  . Marital status: Single    Spouse name: N/A  . Number of children: N/A  . Years of education: N/A   Social History Main Topics  . Smoking status: Current Some Day Smoker    Packs/day: 0.00    Types: Cigarettes    Last attempt to quit: 09/08/2014  . Smokeless tobacco: Never Used  . Alcohol use No     Comment: 05-27-2017 per pt Rarely  . Drug use: No     Comment: 05-27-2017 per pt stopped Marijuana at 66 years old  . Sexual activity: Not on file   Other Topics Concern  . Not on file   Social History Narrative  . No narrative on file    Allergies: No Known Allergies  Metabolic Disorder Labs: Lab Results  Component Value Date   HGBA1C 5.6 08/25/2014   MPG 114 08/25/2014   No results found for: PROLACTIN No results found for: CHOL, TRIG, HDL, CHOLHDL, VLDL, LDLCALC Lab Results  Component Value Date   TSH 1.740 08/25/2014    Therapeutic Level Labs: No results found for: LITHIUM No results found for: VALPROATE No components found for:  CBMZ  Current Medications: Current Outpatient Prescriptions  Medication Sig Dispense Refill  . albuterol (PROVENTIL HFA;VENTOLIN HFA) 108 (90 BASE) MCG/ACT inhaler Inhale 2 puffs into  the lungs 3 (three) times daily. 1 Inhaler 2  . albuterol (PROVENTIL) (2.5 MG/3ML) 0.083% nebulizer solution Take 3 mLs (2.5 mg total) by nebulization every 6 (six) hours as needed for wheezing. 75 mL 12  . ARIPiprazole (ABILIFY) 5 MG tablet Take 1 tablet (5 mg total) by mouth daily. 30 tablet 1  . ferrous sulfate 325 (65 FE) MG tablet Take 1 tablet (325 mg total) by mouth 2 (two) times daily with a meal. (Patient taking differently: Take 325 mg by mouth daily. )    . ibuprofen (ADVIL,MOTRIN) 800 MG tablet Take 800 mg by mouth every 8 (eight) hours as needed.    . IRON PO Take 50 mg  by mouth daily.    Marland Kitchen lovastatin (MEVACOR) 20 MG tablet Take 20 mg by mouth at bedtime.    Marland Kitchen MELATONIN PO Take by mouth at bedtime.    . mometasone-formoterol (DULERA) 100-5 MCG/ACT AERO Inhale 2 puffs into the lungs 2 (two) times daily.    . sertraline (ZOLOFT) 100 MG tablet Take 1 tablet (100 mg total) by mouth daily. 30 tablet 1   No current facility-administered medications for this visit.      Musculoskeletal: Strength & Muscle Tone: within normal limits Gait & Station: normal Patient leans: N/A  Psychiatric Specialty Exam: Review of Systems  Psychiatric/Behavioral: Negative for depression, hallucinations, substance abuse and suicidal ideas. The patient is nervous/anxious. The patient does not have insomnia.   All other systems reviewed and are negative.   Blood pressure (!) 163/68, pulse 68, height 5' 4.02" (1.626 m), weight 103 lb 6.4 oz (46.9 kg).Body mass index is 17.74 kg/m.  General Appearance: Fairly Groomed  Eye Contact:  Good  Speech:  Clear and Coherent  Volume:  Normal  Mood:  "fine"  Affect:  Appropriate and Congruent  Thought Process:  Coherent and Goal Directed  Orientation:  Full (Time, Place, and Person)  Thought Content: Logical Perceptions: denies AH/VH  Suicidal Thoughts:  No  Homicidal Thoughts:  No  Memory:  Immediate;   Good Recent;   Good Remote;   Good  Judgement:  Good  Insight:  Shallow  Psychomotor Activity:  Normal  Concentration:  Concentration: Good and Attention Span: Good  Recall:  Good  Fund of Knowledge: Good  Language: Good  Akathisia:  No  Handed:  Right  AIMS (if indicated): not done  Assets:  Communication Skills Desire for Improvement  ADL's:  Intact  Cognition: WNL  Sleep:  Fair   Screenings:  Assessment and Plan:  Vincent Robertson is a 66 y.o. year old male with a history of anxiety, depression, COPD, MVP , who presents for follow up appointment for Adjustment disorder with mixed disturbance of emotions and  conduct  # Adjustment disorder with anxiety and depressed mood Although he has mild anxiety in the setting of discordance with his wife, he denies other significant neurovegetative symptoms. Will continue sertraline to target mood symptoms and Abilify as adjunctive treatment for depression and anger outburst. He will greatly benefit from continued therapy for anger management. Discussed about effective communication; discuss possible referral for couple therapy.   Plan 1. Continue sertraline 100 mg daily 2. Continue Abilify 5 mg daily 3. Return to clinic in one month for 30 mins  The duration of this appointment visit was 30 minutes of face-to-face time with the patient.  Greater than 50% of this time was spent in counseling, explanation of  diagnosis, planning of further management, and coordination of care  Neysa Hottereina Valerio Pinard, MD 08/18/2017, 2:00 PM

## 2017-08-18 ENCOUNTER — Ambulatory Visit (INDEPENDENT_AMBULATORY_CARE_PROVIDER_SITE_OTHER): Payer: Medicare Other | Admitting: Psychiatry

## 2017-08-18 VITALS — BP 163/68 | HR 68 | Ht 64.02 in | Wt 103.4 lb

## 2017-08-18 DIAGNOSIS — F4325 Adjustment disorder with mixed disturbance of emotions and conduct: Secondary | ICD-10-CM | POA: Diagnosis not present

## 2017-08-18 DIAGNOSIS — F17210 Nicotine dependence, cigarettes, uncomplicated: Secondary | ICD-10-CM

## 2017-08-18 MED ORDER — SERTRALINE HCL 100 MG PO TABS: 100.0000 mg | ORAL_TABLET | Freq: Every day | ORAL | 1 refills | Status: DC

## 2017-08-18 MED ORDER — ARIPIPRAZOLE 5 MG PO TABS: 5.0000 mg | ORAL_TABLET | Freq: Every day | ORAL | 1 refills | Status: DC

## 2017-08-18 NOTE — Patient Instructions (Signed)
1. Continue sertraline 100 mg daily 2. Continue Abilify 5 mg daily 3. Return to clinic in one month for 30 mins

## 2017-09-01 ENCOUNTER — Ambulatory Visit (INDEPENDENT_AMBULATORY_CARE_PROVIDER_SITE_OTHER): Payer: Medicare Other | Admitting: Licensed Clinical Social Worker

## 2017-09-01 DIAGNOSIS — J449 Chronic obstructive pulmonary disease, unspecified: Secondary | ICD-10-CM | POA: Diagnosis not present

## 2017-09-01 DIAGNOSIS — Z630 Problems in relationship with spouse or partner: Secondary | ICD-10-CM | POA: Diagnosis not present

## 2017-09-01 DIAGNOSIS — F4325 Adjustment disorder with mixed disturbance of emotions and conduct: Secondary | ICD-10-CM | POA: Diagnosis not present

## 2017-09-01 DIAGNOSIS — M545 Low back pain: Secondary | ICD-10-CM | POA: Diagnosis not present

## 2017-09-01 DIAGNOSIS — E559 Vitamin D deficiency, unspecified: Secondary | ICD-10-CM | POA: Diagnosis not present

## 2017-09-01 DIAGNOSIS — E785 Hyperlipidemia, unspecified: Secondary | ICD-10-CM | POA: Diagnosis not present
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
## 2017-09-10 NOTE — Progress Notes (Signed)
BH MD/PA/NP OP Progress Note  09/15/2017 1:19 PM Vincent Robertson  MRN:  [id number]  Chief Complaint:  Chief Complaint    Follow-up; Depression     HPI:  Patient presents for follow up appointment for adjustment disorder. He states that he is doing well overall. However, he states that his fiance might think "I have some issues." She complained about his "mannerism." He was told by her that he is angry, referring to his "angry face." He believes she has been stressed by her family who underwent surgery and another granddaughter who has a baby. Although he wanted to have more time just by themselves, he has not communicated to her yet as she seems to be busy. He states that he tends to be "addicted" to her; does not want to be by himself. He reports fair sleep. He feels less anxious. He denies panic attacks. He denies feeling depressed. He has fair energy. He denies SI, HI. He feels irritable at times, but believes it has become better.   Wt Readings from Last 3 Encounters:  09/15/17 107 lb 9.6 oz (48.8 kg)  08/18/17 103 lb 6.4 oz (46.9 kg)  07/30/17 103 lb 12.8 oz (47.1 kg)    Visit Diagnosis:    ICD-10-CM   1. Adjustment disorder, unspecified type F43.20     Past Psychiatric History:  I have reviewed the patient's psychiatry history in detail and updated the patient record. Outpatient: at [facility] clinic before Psychiatry admission: [facility] twice in 2016 for SI ("lied" for his anger outburst to stay away from people) Previous suicide attempt: denies Past trials of medication: lamotrigine, mirtazapine, Abilify History of violence: denies  Past Medical History:  Past Medical History:  Diagnosis Date  . CAP (community acquired pneumonia) 08/25/2014   With mild sepsis.  Marland Kitchen COPD (chronic obstructive pulmonary disease) (HCC)   . Cough   . 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 Mild mitral valve prolapse 08/26/2014  . Murmur   . Tobacco use   . Tricuspid regurgitation  08/26/2014   Mild-mod; Mild Pulm HTN  48 mmHg   No past surgical history on file.  Family Psychiatric History:  I have reviewed the patient's family history in detail and updated the patient record.  Family History: No family history on file.  Social History:  Social History   Social History  . Marital status: Single    Spouse name: N/A  . Number of children: N/A  . Years of education: N/A   Social History Main Topics  . Smoking status: Current Some Day Smoker    Packs/day: 0.00    Types: Cigarettes    Last attempt to quit: 09/08/2014  . Smokeless tobacco: Never Used  . Alcohol use No     Comment: 05-27-2017 per pt Rarely  . Drug use: No     Comment: 05-27-2017 per pt stopped Marijuana at 66 years old  . Sexual activity: Not on file   Other Topics Concern  . Not on file   Social History Narrative  . No narrative on file    Allergies: No Known Allergies  Metabolic Disorder Labs: Lab Results  Component Value Date   HGBA1C 5.6 08/25/2014   MPG 114 08/25/2014   No results found for: PROLACTIN No results found for: CHOL, TRIG, HDL, CHOLHDL, VLDL, LDLCALC Lab Results  Component Value Date   TSH 1.740 08/25/2014    Therapeutic Level Labs: No results found for: LITHIUM No results found for: VALPROATE No components found for:  CBMZ  Current Medications: Current Outpatient Prescriptions  Medication Sig Dispense Refill  . albuterol (PROVENTIL HFA;VENTOLIN HFA) 108 (90 BASE) MCG/ACT inhaler Inhale 2 puffs into the lungs 3 (three) times daily. 1 Inhaler 2  . albuterol (PROVENTIL) (2.5 MG/3ML) 0.083% nebulizer solution Take 3 mLs (2.5 mg total) by nebulization every 6 (six) hours as needed for wheezing. 75 mL 12  . ARIPiprazole (ABILIFY) 5 MG tablet Take 1 tablet (5 mg total) by mouth daily. 30 tablet 1  . ferrous sulfate 325 (65 FE) MG tablet Take 1 tablet (325 mg total) by mouth 2 (two) times daily with a meal. (Patient taking differently: Take 325 mg by mouth daily. )     . ibuprofen (ADVIL,MOTRIN) 800 MG tablet Take 800 mg by mouth every 8 (eight) hours as needed.    . IRON PO Take 50 mg by mouth daily.    Marland Kitchen lovastatin (MEVACOR) 20 MG tablet Take 20 mg by mouth at bedtime.    Marland Kitchen MELATONIN PO Take by mouth at bedtime.    . mometasone-formoterol (DULERA) 100-5 MCG/ACT AERO Inhale 2 puffs into the lungs 2 (two) times daily.    . sertraline (ZOLOFT) 100 MG tablet Take 1 tablet (100 mg total) by mouth daily. 30 tablet 1   No current facility-administered medications for this visit.      Musculoskeletal: Strength & Muscle Tone: within normal limits Gait & Station: normal Patient leans: N/A  Psychiatric Specialty Exam: Review of Systems  All other systems reviewed and are negative.   Blood pressure (!) 169/74, pulse 78, height 5' 4.02" (1.626 m), weight 107 lb 9.6 oz (48.8 kg).Body mass index is 18.46 kg/m.  General Appearance: Fairly Groomed  Eye Contact:  Good  Speech:  Clear and Coherent  Volume:  Normal  Mood:  "fine"  Affect:  Appropriate, Congruent and slightly restricted  Thought Process:  Coherent and Goal Directed  Orientation:  Full (Time, Place, and Person)  Thought Content: Logical Perceptions: denies AH/VH  Suicidal Thoughts:  No  Homicidal Thoughts:  No  Memory:  Immediate;   Good Recent;   Good Remote;   Good  Judgement:  Fair  Insight:  Present  Psychomotor Activity:  Normal  Concentration:  Concentration: Good and Attention Span: Good  Recall:  Good  Fund of Knowledge: Good  Language: Good  Akathisia:  No  Handed:  Right  AIMS (if indicated): not done  Assets:  Communication Skills Desire for Improvement  ADL's:  Intact  Cognition: WNL  Sleep:  Fair   Screenings:   Assessment and Plan:  Vincent Robertson is a 66 y.o. year old male with a history of anxiety,COPD, MVP  , who presents for follow up appointment for Adjustment disorder, unspecified type  # Adjustment disorder with anxiety and depressed  mood Although he continues to ruminate on relationship issues with his fiancee, there appears to be overall improvement in mood symptoms. Will continue sertraline to target anxiety and Abilify as adjunctive treatment for depression and anger outburst. Discussed effective communication. Discussed healthy boundary. He is encouraged to continue to see a therapist.   # Hypertension He is advised to see PCP for hypertension.   Plan 1. Continue sertraline 100 mg daily 2. Continue Abilify 5 mg daily 3. Return to clinic in two months for 30 mins  The duration of this appointment visit was 30 minutes of face-to-face time with the patient.  Greater than 50% of this time was spent in counseling, explanation of  diagnosis,  planning of further management, and coordination of care.   Neysa Hotter, MD 09/15/2017, 1:19 PM

## 2017-09-15 ENCOUNTER — Ambulatory Visit (INDEPENDENT_AMBULATORY_CARE_PROVIDER_SITE_OTHER): Payer: Medicare Other | Admitting: Psychiatry

## 2017-09-15 VITALS — BP 169/74 | HR 78 | Ht 64.02 in | Wt 107.6 lb

## 2017-09-15 DIAGNOSIS — J449 Chronic obstructive pulmonary disease, unspecified: Secondary | ICD-10-CM

## 2017-09-15 DIAGNOSIS — F4323 Adjustment disorder with mixed anxiety and depressed mood: Secondary | ICD-10-CM | POA: Diagnosis not present

## 2017-09-15 DIAGNOSIS — Z79899 Other long term (current) drug therapy: Secondary | ICD-10-CM

## 2017-09-15 DIAGNOSIS — F17210 Nicotine dependence, cigarettes, uncomplicated: Secondary | ICD-10-CM

## 2017-09-15 DIAGNOSIS — F4320 Adjustment disorder, unspecified: Secondary | ICD-10-CM

## 2017-09-15 DIAGNOSIS — I10 Essential (primary) hypertension: Secondary | ICD-10-CM

## 2017-09-15 MED ORDER — ARIPIPRAZOLE 5 MG PO TABS: 5.0000 mg | ORAL_TABLET | Freq: Every day | ORAL | 1 refills | Status: AC

## 2017-09-15 MED ORDER — SERTRALINE HCL 100 MG PO TABS: 100.0000 mg | ORAL_TABLET | Freq: Every day | ORAL | 1 refills | Status: AC

## 2017-09-15 NOTE — Patient Instructions (Signed)
1. Continue sertraline 100 mg daily 2. Continue Abilify 5 mg daily 3. Return to clinic in two months for 30 mins

## 2017-09-29 ENCOUNTER — Ambulatory Visit (INDEPENDENT_AMBULATORY_CARE_PROVIDER_SITE_OTHER): Payer: Medicare Other | Admitting: Licensed Clinical Social Worker

## 2017-09-29 DIAGNOSIS — F4325 Adjustment disorder with mixed disturbance of emotions and conduct: Secondary | ICD-10-CM | POA: Diagnosis not present

## 2017-09-29 DIAGNOSIS — Z630 Problems in relationship with spouse or partner: Secondary | ICD-10-CM | POA: Diagnosis not present
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
## 2017-10-01 DIAGNOSIS — G4730 Sleep apnea, unspecified: Secondary | ICD-10-CM | POA: Diagnosis not present

## 2017-10-01 DIAGNOSIS — R5382 Chronic fatigue, unspecified: Secondary | ICD-10-CM | POA: Diagnosis not present

## 2017-10-01 DIAGNOSIS — J449 Chronic obstructive pulmonary disease, unspecified: Secondary | ICD-10-CM | POA: Diagnosis not present

## 2017-10-01 DIAGNOSIS — R0683 Snoring: Secondary | ICD-10-CM | POA: Diagnosis not present

## 2017-10-28 ENCOUNTER — Ambulatory Visit (HOSPITAL_COMMUNITY): Payer: Self-pay | Admitting: Licensed Clinical Social Worker

## 2017-11-05 NOTE — Progress Notes (Deleted)
BH MD/PA/NP OP Progress Note  11/05/2017 11:41 AM Vincent Robertson  MRN:  [id number]  Chief Complaint:  HPI: *** Visit Diagnosis: No diagnosis found.  Past Psychiatric History:  I have reviewed the patient's psychiatry history in detail and updated the patient record. [facility] in [facility] for SI ("lied" for his anger outburst to stay away from people) Previous suicide attempt: denies Past trials of medication: lamotrigine, mirtazapine, Abilify History of violence: denies  Past Medical History:  Past Medical History:  Diagnosis Date  . CAP (community acquired pneumonia) 08/25/2014   With mild sepsis.  Marland Kitchen. COPD (chronic obstructive pulmonary disease) (HCC)   . Cough   . 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 Mild mitral valve prolapse 08/26/2014  . Murmur   . Tobacco use   . Tricuspid regurgitation 08/26/2014   Mild-mod; Mild Pulm HTN  48 mmHg   No past surgical history on file.  Family Psychiatric History: I have reviewed the patient's family history in detail and updated the patient record.  Family History: No family history on file.  Social History:  Social History   Socioeconomic History  . Marital status: Single    Spouse name: Not on file  . Number of children: Not on file  . Years of education: Not on file  . Highest education level: Not on file  Social Needs  . Financial resource strain: Not on file  . Food insecurity - worry: Not on file  . Food insecurity - inability: Not on file  . Transportation needs - medical: Not on file  . Transportation needs - non-medical: Not on file  Occupational History  . Not on file  Tobacco Use  . Smoking status: Current Some Day Smoker    Packs/day: 0.00    Types: Cigarettes    Last attempt to quit: 09/08/2014    Years since quitting: 3.1  . Smokeless tobacco: Never Used  Substance and Sexual Activity  . Alcohol use: No    Comment: 05-27-2017 per pt  Rarely  . Drug use: No    Comment: 05-27-2017 per pt stopped Marijuana at 66 years old  . Sexual activity: Not on file  Other Topics Concern  . Not on file  Social History Narrative  . Not on file    Allergies: No Known Allergies  Metabolic Disorder Labs: Lab Results  Component Value Date   HGBA1C 5.6 08/25/2014   MPG 114 08/25/2014   No results found for: PROLACTIN No results found for: CHOL, TRIG, HDL, CHOLHDL, VLDL, LDLCALC Lab Results  Component Value Date   TSH 1.740 08/25/2014    Therapeutic Level Labs: No results found for: LITHIUM No results found for: VALPROATE No components found for:  CBMZ  Current Medications: Current Outpatient Medications  Medication Sig Dispense Refill  . albuterol (PROVENTIL HFA;VENTOLIN HFA) 108 (90 BASE) MCG/ACT inhaler Inhale 2 puffs into the lungs 3 (three) times daily. 1 Inhaler 2  . albuterol (PROVENTIL) (2.5 MG/3ML) 0.083% nebulizer solution Take 3 mLs (2.5 mg total) by nebulization every 6 (six) hours as needed for wheezing. 75 mL 12  . ARIPiprazole (ABILIFY) 5 MG tablet Take 1 tablet (5 mg total) by mouth daily. 30 tablet 1  . ferrous sulfate 325 (65 FE) MG tablet Take 1 tablet (325 mg total) by mouth 2 (two) times daily with a meal. (Patient taking differently: Take 325 mg by mouth daily. )    . ibuprofen (ADVIL,MOTRIN) 800 MG tablet Take  800 mg by mouth every 8 (eight) hours as needed.    . IRON PO Take 50 mg by mouth daily.    Marland Kitchen. lovastatin (MEVACOR) 20 MG tablet Take 20 mg by mouth at bedtime.    Marland Kitchen. MELATONIN PO Take by mouth at bedtime.    . mometasone-formoterol (DULERA) 100-5 MCG/ACT AERO Inhale 2 puffs into the lungs 2 (two) times daily.    . sertraline (ZOLOFT) 100 MG tablet Take 1 tablet (100 mg total) by mouth daily. 30 tablet 1   No current facility-administered medications for this visit.      Musculoskeletal: Strength & Muscle Tone: within normal limits Gait & Station: normal Patient leans: N/A  Psychiatric  Specialty Exam: ROS  There were no vitals taken for this visit.There is no height or weight on file to calculate BMI.  General Appearance: Fairly Groomed  Eye Contact:  Good  Speech:  Clear and Coherent  Volume:  Normal  Mood:  {BHH MOOD:22306}  Affect:  {Affect (PAA):22687}  Thought Process:  Coherent and Goal Directed  Orientation:  Full (Time, Place, and Person)  Thought Content: Logical   Suicidal Thoughts:  {ST/HT (PAA):22692}  Homicidal Thoughts:  {ST/HT (PAA):22692}  Memory:  Immediate;   Good Recent;   Good Remote;   Good  Judgement:  {Judgement (PAA):22694}  Insight:  {Insight (PAA):22695}  Psychomotor Activity:  Normal  Concentration:  Concentration: Good and Attention Span: Good  Recall:  Good  Fund of Knowledge: Good  Language: Good  Akathisia:  No  Handed:  Right  AIMS (if indicated): not done  Assets:  Communication Skills Desire for Improvement  ADL's:  Intact  Cognition: WNL  Sleep:  {BHH GOOD/FAIR/POOR:22877}   Screenings:   Assessment and Plan:  Vincent Robertson is a 66 y.o. year old male with a history of anxiety, COPD, MVP , who presents for follow up appointment for No diagnosis found.  # Adjustment disorder with anxiety and depressed mood  Although he continues to ruminate on relationship issues with his fiancee, there appears to be overall improvement in mood symptoms. Will continue sertraline to target anxiety and Abilify as adjunctive treatment for depression and anger outburst. Discussed effective communication. Discussed healthy boundary. He is encouraged to continue to see a therapist.   # Hypertension He is advised to see PCP for hypertension.   Plan 1. Continue sertraline 100 mg daily 2. Continue Abilify 5 mg daily 3. Return to clinic in two months for 30 mins     Neysa Hottereina Kendyl Festa, MD 11/05/2017, 11:41 AM

## 2017-11-08 IMAGING — DX DG KNEE 1-2V*R*
2 series · 2 of 2 positions shown · non-contrast
Comparison: No recent prior.

CLINICAL DATA: Injury.

EXAM:
RIGHT KNEE - 1-2 VIEW

[knee ap]
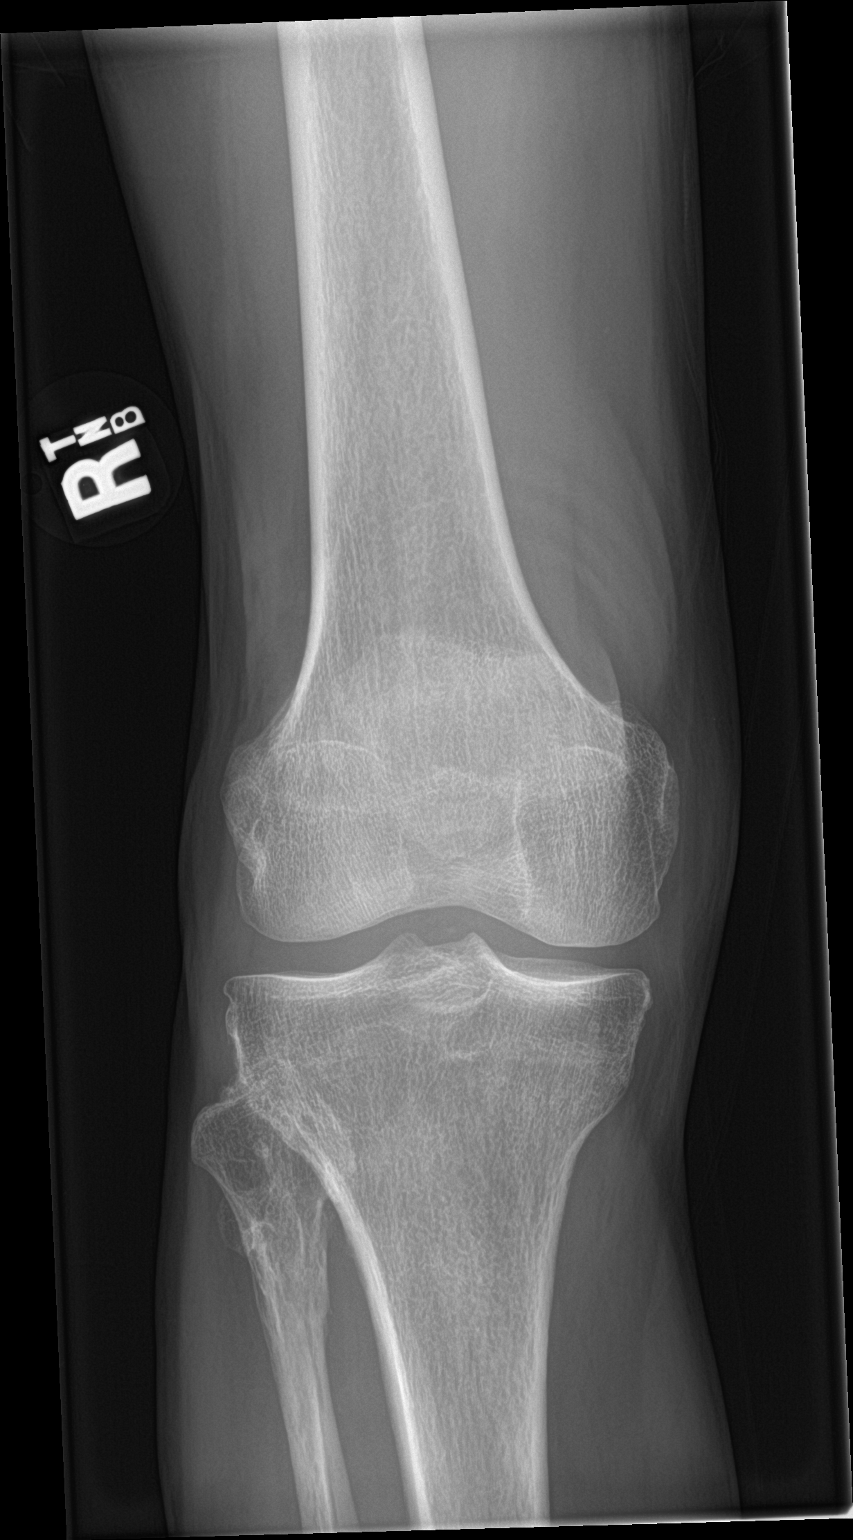

[knee lat]
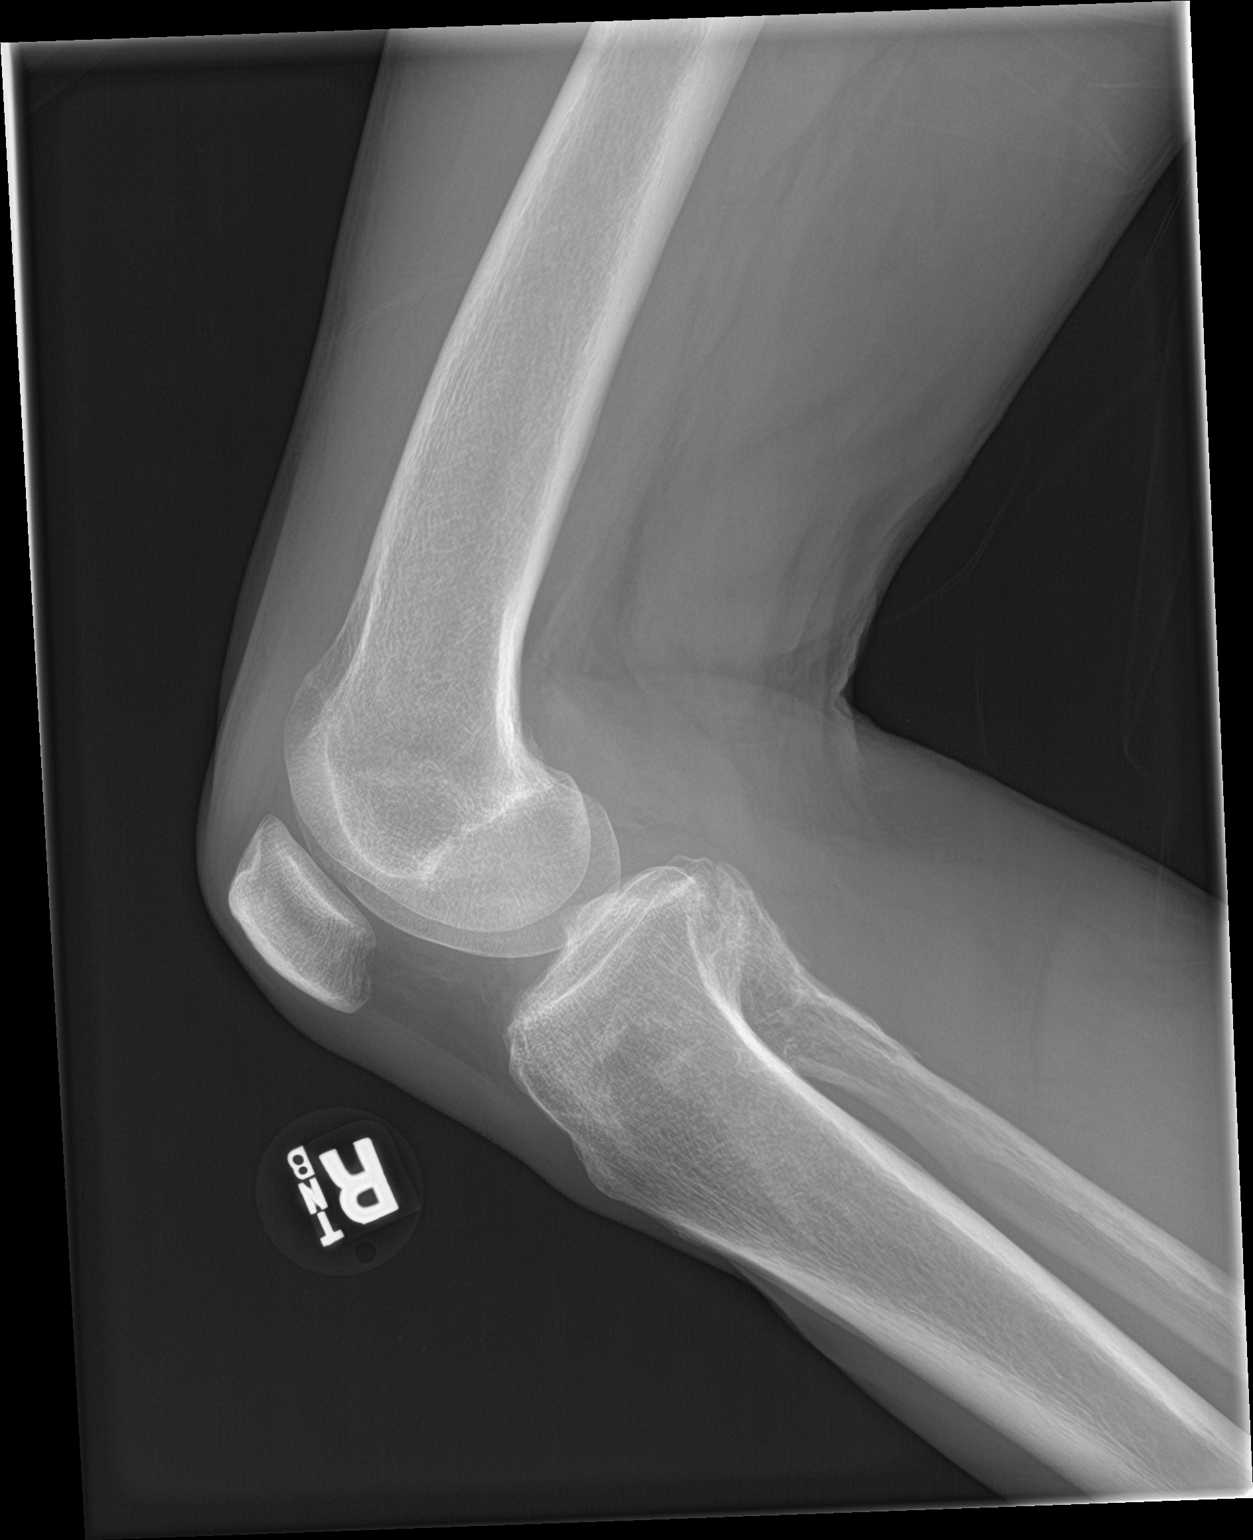

[2 of 2 positions shown; findings below may reference images not displayed]

FINDINGS: No acute bony or joint abnormality identified. No evidence of
fracture or dislocation. Deformity noted of the proximal tibia, this
may be from old injury.
IMPRESSION: Diffuse degenerative change.  No acute abnormality.

## 2017-11-11 ENCOUNTER — Telehealth (HOSPITAL_COMMUNITY): Payer: Self-pay | Admitting: *Deleted

## 2017-11-11 ENCOUNTER — Ambulatory Visit (HOSPITAL_COMMUNITY): Payer: Medicare Other | Admitting: Psychiatry

## 2017-11-11 NOTE — Telephone Encounter (Signed)
left voice message, provider out of office 11/11/17. 

## 2017-11-25 DIAGNOSIS — B351 Tinea unguium: Secondary | ICD-10-CM | POA: Diagnosis not present

## 2017-11-25 DIAGNOSIS — E785 Hyperlipidemia, unspecified: Secondary | ICD-10-CM | POA: Diagnosis not present

## 2017-11-25 DIAGNOSIS — J449 Chronic obstructive pulmonary disease, unspecified: Secondary | ICD-10-CM | POA: Diagnosis not present

## 2017-11-25 DIAGNOSIS — E559 Vitamin D deficiency, unspecified: Secondary | ICD-10-CM | POA: Diagnosis not present

## 2017-11-25 DIAGNOSIS — Q845 Enlarged and hypertrophic nails: Secondary | ICD-10-CM | POA: Diagnosis not present

## 2017-12-06 ENCOUNTER — Encounter (HOSPITAL_COMMUNITY): Payer: Self-pay | Admitting: Emergency Medicine

## 2017-12-06 ENCOUNTER — Emergency Department (HOSPITAL_COMMUNITY): Payer: Medicare Other

## 2017-12-06 ENCOUNTER — Other Ambulatory Visit: Payer: Self-pay

## 2017-12-06 ENCOUNTER — Inpatient Hospital Stay (HOSPITAL_COMMUNITY)
Admission: EM | Admit: 2017-12-06 | Discharge: 2017-12-07 | DRG: 192 | Disposition: A | Discharge status: Home / Self Care | Payer: Medicare Other | Attending: Family Medicine | Admitting: Family Medicine

## 2017-12-06 DIAGNOSIS — I272 Pulmonary hypertension, unspecified: Secondary | ICD-10-CM | POA: Diagnosis not present

## 2017-12-06 DIAGNOSIS — J441 Chronic obstructive pulmonary disease with (acute) exacerbation: Principal | ICD-10-CM | POA: Diagnosis present

## 2017-12-06 DIAGNOSIS — I081 Rheumatic disorders of both mitral and tricuspid valves: Secondary | ICD-10-CM | POA: Diagnosis not present

## 2017-12-06 DIAGNOSIS — R0602 Shortness of breath: Secondary | ICD-10-CM | POA: Diagnosis not present

## 2017-12-06 DIAGNOSIS — Z79899 Other long term (current) drug therapy: Secondary | ICD-10-CM | POA: Diagnosis not present

## 2017-12-06 DIAGNOSIS — N289 Disorder of kidney and ureter, unspecified: Secondary | ICD-10-CM | POA: Diagnosis not present

## 2017-12-06 DIAGNOSIS — F418 Other specified anxiety disorders: Secondary | ICD-10-CM | POA: Diagnosis present

## 2017-12-06 DIAGNOSIS — F17210 Nicotine dependence, cigarettes, uncomplicated: Secondary | ICD-10-CM | POA: Diagnosis present

## 2017-12-06 DIAGNOSIS — R05 Cough: Secondary | ICD-10-CM | POA: Diagnosis not present

## 2017-12-06 LAB — BASIC METABOLIC PANEL
ANION GAP: 11 (ref 5–15)
BUN: 38 mg/dL — ABNORMAL HIGH (ref 6–20)
CALCIUM: 9.3 mg/dL (ref 8.9–10.3)
CO2: 26 mmol/L (ref 22–32)
CREATININE: 1.37 mg/dL — AB (ref 0.61–1.24)
Chloride: 104 mmol/L (ref 101–111)
GFR, EST NON AFRICAN AMERICAN: 52 mL/min — AB (ref >60)
Glucose, Bld: 98 mg/dL (ref 65–99)
Potassium: 4.5 mmol/L (ref 3.5–5.1)
SODIUM: 141 mmol/L (ref 135–145)

## 2017-12-06 LAB — CBC WITH DIFFERENTIAL/PLATELET
BASOS ABS: 0.1 10*3/uL (ref 0.0–0.1)
BASOS PCT: 1 %
EOS ABS: 0.2 10*3/uL (ref 0.0–0.7)
Eosinophils Relative: 2 %
HEMATOCRIT: 41.4 % (ref 39.0–52.0)
HEMOGLOBIN: 13.7 g/dL (ref 13.0–17.0)
Lymphocytes Relative: 20 %
Lymphs Abs: 2.2 10*3/uL (ref 0.7–4.0)
MCH: 31.5 pg (ref 26.0–34.0)
MCHC: 33.1 g/dL (ref 30.0–36.0)
MCV: 95.2 fL (ref 78.0–100.0)
Monocytes Absolute: 0.9 10*3/uL (ref 0.1–1.0)
Monocytes Relative: 9 %
NEUTROS ABS: 7.5 10*3/uL (ref 1.7–7.7)
NEUTROS PCT: 68 %
Platelets: 271 10*3/uL (ref 150–400)
RBC: 4.35 MIL/uL (ref 4.22–5.81)
RDW: 13.3 % (ref 11.5–15.5)
WBC: 10.9 10*3/uL — AB (ref 4.0–10.5)

## 2017-12-06 LAB — TROPONIN I: Troponin I: <0.03 ng/mL (ref <0.03)

## 2017-12-06 MED ORDER — ACETAMINOPHEN 325 MG PO TABS: 650.0000 mg | ORAL_TABLET | Freq: Four times a day (QID) | ORAL | Status: DC | PRN

## 2017-12-06 MED ORDER — DEXTROSE 5 % IV SOLN
500.0000 mg | INTRAVENOUS | Status: DC
  Administered 2017-12-07 (×2): 500 mg via INTRAVENOUS
  Filled 2017-12-06 (×4): qty 500

## 2017-12-06 MED ORDER — ALBUTEROL SULFATE (2.5 MG/3ML) 0.083% IN NEBU
5.0000 mg | INHALATION_SOLUTION | Freq: Once | RESPIRATORY_TRACT | Status: AC
  Administered 2017-12-06: 5 mg via RESPIRATORY_TRACT
  Filled 2017-12-06: qty 6

## 2017-12-06 MED ORDER — ZOLPIDEM TARTRATE 5 MG PO TABS: 5.0000 mg | ORAL_TABLET | Freq: Every evening | ORAL | Status: DC | PRN

## 2017-12-06 MED ORDER — SENNOSIDES-DOCUSATE SODIUM 8.6-50 MG PO TABS: 1.0000 | ORAL_TABLET | Freq: Every evening | ORAL | Status: DC | PRN

## 2017-12-06 MED ORDER — ONDANSETRON HCL 4 MG PO TABS: 4.0000 mg | ORAL_TABLET | Freq: Four times a day (QID) | ORAL | Status: DC | PRN

## 2017-12-06 MED ORDER — METHYLPREDNISOLONE SODIUM SUCC 40 MG IJ SOLR
40.0000 mg | Freq: Three times a day (TID) | INTRAMUSCULAR | Status: DC
  Administered 2017-12-07 (×2): 40 mg via INTRAVENOUS
  Filled 2017-12-06 (×2): qty 1

## 2017-12-06 MED ORDER — BISACODYL 5 MG PO TBEC: 5.0000 mg | DELAYED_RELEASE_TABLET | Freq: Every day | ORAL | Status: DC | PRN

## 2017-12-06 MED ORDER — METHYLPREDNISOLONE SODIUM SUCC 125 MG IJ SOLR
125.0000 mg | Freq: Once | INTRAMUSCULAR | Status: AC
  Administered 2017-12-07: 125 mg via INTRAVENOUS
  Filled 2017-12-06: qty 2

## 2017-12-06 MED ORDER — ONDANSETRON HCL 4 MG/2ML IJ SOLN
4.0000 mg | Freq: Four times a day (QID) | INTRAMUSCULAR | Status: DC | PRN
  Administered 2017-12-07: 4 mg via INTRAVENOUS
  Filled 2017-12-06: qty 2

## 2017-12-06 MED ORDER — SERTRALINE HCL 50 MG PO TABS
100.0000 mg | ORAL_TABLET | Freq: Every day | ORAL | Status: DC
  Administered 2017-12-07: 100 mg via ORAL
  Filled 2017-12-06: qty 2

## 2017-12-06 MED ORDER — ALBUTEROL SULFATE (2.5 MG/3ML) 0.083% IN NEBU
2.5000 mg | INHALATION_SOLUTION | RESPIRATORY_TRACT | Status: DC | PRN
  Administered 2017-12-07 (×2): 2.5 mg via RESPIRATORY_TRACT
  Filled 2017-12-06 (×2): qty 3

## 2017-12-06 MED ORDER — DEXTROSE 5 % IV SOLN
1.0000 g | Freq: Once | INTRAVENOUS | Status: AC
  Administered 2017-12-07: 1 g via INTRAVENOUS
  Filled 2017-12-06: qty 10

## 2017-12-06 MED ORDER — ENOXAPARIN SODIUM 40 MG/0.4ML ~~LOC~~ SOLN
40.0000 mg | SUBCUTANEOUS | Status: DC
  Administered 2017-12-07: 40 mg via SUBCUTANEOUS
  Filled 2017-12-06: qty 0.4

## 2017-12-06 MED ORDER — ARIPIPRAZOLE 5 MG PO TABS
5.0000 mg | ORAL_TABLET | Freq: Every day | ORAL | Status: DC
  Administered 2017-12-07: 5 mg via ORAL
  Filled 2017-12-06 (×2): qty 1

## 2017-12-06 MED ORDER — HYDROCODONE-ACETAMINOPHEN 5-325 MG PO TABS: 1.0000 | ORAL_TABLET | ORAL | Status: DC | PRN

## 2017-12-06 MED ORDER — PRAVASTATIN SODIUM 10 MG PO TABS
20.0000 mg | ORAL_TABLET | Freq: Every day | ORAL | Status: DC
  Administered 2017-12-07: 20 mg via ORAL
  Filled 2017-12-06: qty 2

## 2017-12-06 MED ORDER — MOMETASONE FURO-FORMOTEROL FUM 100-5 MCG/ACT IN AERO
2.0000 | INHALATION_SPRAY | Freq: Two times a day (BID) | RESPIRATORY_TRACT | Status: DC
  Filled 2017-12-06: qty 8.8

## 2017-12-06 MED ORDER — ACETAMINOPHEN 650 MG RE SUPP: 650.0000 mg | Freq: Four times a day (QID) | RECTAL | Status: DC | PRN

## 2017-12-06 NOTE — ED Triage Notes (Signed)
Patient complains of shortness of breath x 2 week with cough.

## 2017-12-06 NOTE — ED Notes (Signed)
Pulse Ox checked while ambulating per orders. Pt's O2 Sats fell to 83%. MD Notified.

## 2017-12-06 NOTE — ED Notes (Signed)
Pt ambulating in hallway. States he has a cramp in his toe and needs to walk it out

## 2017-12-06 NOTE — ED Notes (Signed)
Called for Xray no answer.

## 2017-12-06 NOTE — ED Notes (Signed)
Called for xray no answer

## 2017-12-06 NOTE — ED Provider Notes (Signed)
[facility] EMERGENCY DEPARTMENT Provider Note   CSN: [id number] Arrival date & time: 12/06/17  1704     History   Chief Complaint Chief Complaint  Patient presents with  . Shortness of Breath    HPI Vincent Robertson is a 66 y.o. male.  HPI Patient has shortness of breath over the last few days.  Has had a cough with some green sputum production.  States increasing shortness of breath.  Has a history of he says somewhat severe COPD.  Not on home oxygen.  No fevers.  Has had some dull chest tightness with it.  States he is not able to do the same activity that he did previously. Past Medical History:  Diagnosis Date  . CAP (community acquired pneumonia) 08/25/2014   With mild sepsis.  Marland Kitchen COPD (chronic obstructive pulmonary disease) (HCC)   . Cough   . 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 Mild mitral valve prolapse 08/26/2014  . Murmur   . Tobacco use   . Tricuspid regurgitation 08/26/2014   Mild-mod; Mild Pulm HTN  48 mmHg    Patient Active Problem List   Diagnosis Date Noted  . Adjustment disorder 05/27/2017  . Protein-calorie malnutrition, severe (HCC) 09/30/2014  . Severe malnutrition (HCC) 08/27/2014  . 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 Tricuspid regurgitation 08/26/2014  . Anemia, unspecified 08/26/2014  . Mild mitral valve prolapse 08/26/2014  . Sepsis (HCC) 08/25/2014  . CAP (community acquired pneumonia) 08/25/2014  . Acute respiratory failure with hypoxia (HCC) 08/25/2014  . COPD exacerbation (HCC) 08/25/2014  . Systolic murmur 08/25/2014  . Fever, unspecified 08/25/2014  . Leukocytosis, unspecified 08/25/2014  . Hyponatremia 08/25/2014  . Dehydration 08/25/2014  . Hyperglycemia 08/25/2014  . Tobacco use disorder 08/25/2014    History reviewed. No pertinent surgical history.     Home Medications    Prior to Admission medications   Medication Sig Start Date End Date Taking? Authorizing Provider  albuterol (PROVENTIL HFA;VENTOLIN HFA)  108 (90 BASE) MCG/ACT inhaler Inhale 2 puffs into the lungs 3 (three) times daily. 08/27/14  Yes Elliot Cousin, MD  albuterol (PROVENTIL) (2.5 MG/3ML) 0.083% nebulizer solution Take 3 mLs (2.5 mg total) by nebulization every 6 (six) hours as needed for wheezing. 10/01/14  Yes Erick Blinks, MD  ARIPiprazole (ABILIFY) 5 MG tablet Take 1 tablet (5 mg total) by mouth daily. 09/15/17  Yes Neysa Hotter, MD  ferrous sulfate 325 (65 FE) MG tablet Take 1 tablet (325 mg total) by mouth 2 (two) times daily with a meal. Patient taking differently: Take 325 mg by mouth daily.  08/27/14  Yes Elliot Cousin, MD  ibuprofen (ADVIL,MOTRIN) 800 MG tablet Take 800 mg by mouth every 8 (eight) hours as needed.   Yes [provider]  IRON PO Take 50 mg by mouth daily.   Yes [provider]  lovastatin (MEVACOR) 20 MG tablet Take 20 mg by mouth at bedtime.   Yes [provider]  MELATONIN PO Take by mouth at bedtime.   Yes [provider]  mometasone-formoterol (DULERA) 100-5 MCG/ACT AERO Inhale 2 puffs into the lungs 2 (two) times daily.   Yes [provider]  sertraline (ZOLOFT) 100 MG tablet Take 1 tablet (100 mg total) by mouth daily. 09/15/17  Yes Neysa Hotter, MD    Family History No family history on file.  Social History Social History   Tobacco Use  . Smoking status: Current Some Day Smoker    Packs/day: 0.00    Types: Cigarettes  Last attempt to quit: 09/08/2014    Years since quitting: 3.2  . Smokeless tobacco: Never Used  Substance Use Topics  . Alcohol use: No    Comment: 05-27-2017 per pt Rarely  . Drug use: No    Comment: 05-27-2017 per pt stopped Marijuana at 66 years old     Allergies   Patient has no known allergies.   Review of Systems Review of Systems  Constitutional: Positive for appetite change.  HENT: Negative for congestion.   Respiratory: Positive for cough and shortness of breath.   Cardiovascular: Positive for chest pain.    Gastrointestinal: Negative for abdominal pain.  Genitourinary: Negative for flank pain.  Musculoskeletal: Negative for back pain.  Skin: Negative for pallor.  Neurological: Negative for seizures.  Hematological: Negative for adenopathy.     Physical Exam Updated Vital Signs BP (!) 149/63 (BP Location: Left Arm)   Pulse 78   Temp 98.3 F (36.8 C) (Oral)   Resp 18   Ht 5\' 4"  (1.626 m)   Wt 49 kg (108 lb)   SpO2 98%   BMI 18.54 kg/m   Physical Exam  Constitutional: He appears well-developed.  HENT:  Head: Normocephalic.  Eyes: Pupils are equal, round, and reactive to light.  Cardiovascular: Regular rhythm.  Pulmonary/Chest:  Patient without focal rales or rhonchi.  Prolonged expirations with some wheezes.  Abdominal: There is no tenderness.  Musculoskeletal:       Right lower leg: He exhibits no edema.       Left lower leg: He exhibits no edema.  Neurological: He is alert.  Skin: Skin is warm. Capillary refill takes less than 2 seconds.     ED Treatments / Results  Labs (all labs ordered are listed, but only abnormal results are displayed) Labs Reviewed  BASIC METABOLIC PANEL - Abnormal; Notable for the following components:      Result Value   BUN 38 (*)    Creatinine, Ser 1.37 (*)    GFR calc non Af Amer 52 (*)    All other components within normal limits  CBC WITH DIFFERENTIAL/PLATELET - Abnormal; Notable for the following components:   WBC 10.9 (*)    All other components within normal limits  TROPONIN I    EKG  EKG Interpretation  Date/Time:  Saturday December 06 2017 17:21:00 EST Ventricular Rate:  95 PR Interval:  124 QRS Duration: 86 QT Interval:  344 QTC Calculation: 432 R Axis:   81 Text Interpretation:  Normal sinus rhythm Possible Left atrial enlargement Borderline ECG T waves more  peaked than prior but also larger QRS amplitude Confirmed by Benjiman CorePickering, Laurance Heide [phone]([id number]) on 12/06/2017 8:13:18 PM       Radiology Dg Chest 2 View  Result  Date: 12/06/2017 CLINICAL DATA:  Shortness of breath for 2 weeks with productive cough EXAM: CHEST  2 VIEW COMPARISON:  05/29/2017 FINDINGS: Cardiac shadow is within normal limits. The lungs are hyperinflated consistent with COPD. Stable right middle lobe collapse is noted. No acute infiltrate or sizable effusion is seen. No bony abnormality is noted. IMPRESSION: COPD with chronic changes in the right middle lobe. No acute abnormality noted. Electronically Signed   By: Alcide CleverMark  Lukens M.D.   On: 12/06/2017 20:15    Procedures Procedures (including critical care time)  Medications Ordered in ED Medications  methylPREDNISolone sodium succinate (SOLU-MEDROL) 125 mg/2 mL injection 125 mg (not administered)  cefTRIAXone (ROCEPHIN) 1 g in dextrose 5 % 50 mL IVPB (not administered)  albuterol (PROVENTIL) (  2.5 MG/3ML) 0.083% nebulizer solution 5 mg (5 mg Nebulization Given 12/06/17 1738)     Initial Impression / Assessment and Plan / ED Course  I have reviewed the triage vital signs and the nursing notes.  Pertinent labs & imaging results that were available during my care of the patient were reviewed by me and considered in my medical decision making (see chart for details).     Patient with shortness of breath.  Likely COPD exacerbation.  No pneumonia seen but will empirically treat with antibiotics due to increased sputum production.  Steroids given.  Patient attempted to ambulate after breathing treatment at sats went down to 83%.  Will admit to hospitalist.  Final Clinical Impressions(s) / ED Diagnoses   Final diagnoses:  COPD exacerbation [facility](HCC)    ED Discharge Orders    None       Benjiman CorePickering, Gearald Stonebraker, MD 12/06/17 2140

## 2017-12-06 NOTE — H&P (Signed)
History and Physical    Carlton AdamLawrence E Meadowcroft JYN:[id number] DOB: 08/19/1951 DOA: 12/06/2017  PCP: Pearson GrippeKim, James, MD   Patient coming from: Home  Chief Complaint: SOB, cough   HPI: Carlton AdamLawrence E Manocchio is a 66 y.o. male with medical history significant for COPD and depression with anxiety, now presenting to the emergency department for evaluation of shortness of breath, productive cough, and general malaise.  Patient reports that his dyspnea worsened insidiouslyover the past couple weeks and his cough has increased as well as sputum production.  Denies fevers or chest pain, and denies lower extremity swelling or tenderness.  ED Course: Upon arrival to the ED, patient is found to be afebrile, saturating adequately on room air while at rest, slightly tachycardic, and with stable blood pressure.  EKG features normal sinus rhythm and chest x-ray is negative for acute cardiopulmonary disease, but notable for chronic COPD changes.  Chemistry panel reveals a creatinine of 1.37, slightly up from priors more than a year ago.  CBC is notable for a slight leukocytosis to 10,900 and troponin is undetectable.  Treated with 125 mg of IV Solu-Medrol, albuterol neb, and Rocephin in the ED.  O2 saturation dropped to 83% with exertion in the ED.  He will be admitted to the medical-surgical unit for ongoing evaluation and management of acute exacerbation in COPD.   Review of Systems:  All other systems reviewed and apart from HPI, are negative.  Past Medical History:  Diagnosis Date  . CAP (community acquired pneumonia) 08/25/2014   With mild sepsis.  Marland Kitchen. COPD (chronic obstructive pulmonary disease) (HCC)   . Cough   . 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 Mild mitral valve prolapse 08/26/2014  . Murmur   . Tobacco use   . Tricuspid regurgitation 08/26/2014   Mild-mod; Mild Pulm HTN  48 mmHg    History reviewed. No pertinent surgical history.   reports that he has been smoking cigarettes.  He has been  smoking about 0.00 packs per day. he has never used smokeless tobacco. He reports that he does not drink alcohol or use drugs.  No Known Allergies  History reviewed. No pertinent family history.   Prior to Admission medications   Medication Sig Start Date End Date Taking? Authorizing Provider  albuterol (PROVENTIL HFA;VENTOLIN HFA) 108 (90 BASE) MCG/ACT inhaler Inhale 2 puffs into the lungs 3 (three) times daily. 08/27/14  Yes Elliot CousinFisher, Denise, MD  albuterol (PROVENTIL) (2.5 MG/3ML) 0.083% nebulizer solution Take 3 mLs (2.5 mg total) by nebulization every 6 (six) hours as needed for wheezing. 10/01/14  Yes Erick BlinksMemon, Jehanzeb, MD  ARIPiprazole (ABILIFY) 5 MG tablet Take 1 tablet (5 mg total) by mouth daily. 09/15/17  Yes Neysa HotterHisada, Reina, MD  ferrous sulfate 325 (65 FE) MG tablet Take 1 tablet (325 mg total) by mouth 2 (two) times daily with a meal. Patient taking differently: Take 325 mg by mouth daily.  08/27/14  Yes Elliot CousinFisher, Denise, MD  ibuprofen (ADVIL,MOTRIN) 800 MG tablet Take 800 mg by mouth every 8 (eight) hours as needed.   Yes [provider]  IRON PO Take 50 mg by mouth daily.   Yes [provider]  lovastatin (MEVACOR) 20 MG tablet Take 20 mg by mouth at bedtime.   Yes [provider]  MELATONIN PO Take by mouth at bedtime.   Yes [provider]  mometasone-formoterol (DULERA) 100-5 MCG/ACT AERO Inhale 2 puffs into the lungs 2 (two) times daily.   Yes [provider]  sertraline (ZOLOFT) 100  MG tablet Take 1 tablet (100 mg total) by mouth daily. 09/15/17  Yes Neysa Hotter, MD    Physical Exam: Vitals:   12/06/17 1738 12/06/17 2010 12/06/17 2015 12/06/17 2102  BP:   (!) 155/70 (!) 149/63  Pulse:   84 78  Resp:   18 18  Temp:      TempSrc:      SpO2: 92% 97% 97% 98%  Weight:      Height:          Constitutional: NAD, calm  Eyes: PERTLA, lids and conjunctivae normal ENMT: Mucous membranes are moist. Posterior pharynx clear of any exudate  or lesions.   Neck: normal, supple, no masses, no thyromegaly Respiratory: Breath sounds markedly diminished bilaterally with prolonged expiratory phase. Dyspnea with speech. No accessory muscle use.  Cardiovascular: S1 & S2 heard, regular rate and rhythm. No extremity edema. 2+ pedal pulses. No significant JVD. Abdomen: No distension, no tenderness, no masses palpated. Bowel sounds normal.  Musculoskeletal: no clubbing / cyanosis. No joint deformity upper and lower extremities.   Skin: no significant rashes, lesions, ulcers. Warm, dry, well-perfused. Neurologic: CN 2-12 grossly intact. Sensation intact. Strength 5/5 in all 4 limbs.  Psychiatric: Alert and oriented x 3. Calm, cooperative.     Labs on Admission: I have personally reviewed following labs and imaging studies  CBC: Recent Labs  Lab 12/06/17 2032  WBC 10.9*  NEUTROABS 7.5  HGB 13.7  HCT 41.4  MCV 95.2  PLT 271   Basic Metabolic Panel: Recent Labs  Lab 12/06/17 2032  NA 141  K 4.5  CL 104  CO2 26  GLUCOSE 98  BUN 38*  CREATININE 1.37*  CALCIUM 9.3   GFR: Estimated Creatinine Clearance: 36.8 mL/min (A) (by C-G formula based on SCr of 1.37 mg/dL (H)). Liver Function Tests: No results for input(s): AST, ALT, ALKPHOS, BILITOT, PROT, ALBUMIN in the last 168 hours. No results for input(s): LIPASE, AMYLASE in the last 168 hours. No results for input(s): AMMONIA in the last 168 hours. Coagulation Profile: No results for input(s): INR, PROTIME in the last 168 hours. Cardiac Enzymes: Recent Labs  Lab 12/06/17 2032  TROPONINI <0.03   BNP (last 3 results) No results for input(s): PROBNP in the last 8760 hours. HbA1C: No results for input(s): HGBA1C in the last 72 hours. CBG: No results for input(s): GLUCAP in the last 168 hours. Lipid Profile: No results for input(s): CHOL, HDL, LDLCALC, TRIG, CHOLHDL, LDLDIRECT in the last 72 hours. Thyroid Function Tests: No results for input(s): TSH, T4TOTAL, FREET4,  T3FREE, THYROIDAB in the last 72 hours. Anemia Panel: No results for input(s): VITAMINB12, FOLATE, FERRITIN, TIBC, IRON, RETICCTPCT in the last 72 hours. Urine analysis:    Component Value Date/Time   COLORURINE YELLOW 12/26/2015 2352   APPEARANCEUR CLEAR 12/26/2015 2352   LABSPEC 1.020 12/26/2015 2352   PHURINE 7.0 12/26/2015 2352   GLUCOSEU NEGATIVE 12/26/2015 2352   HGBUR NEGATIVE 12/26/2015 2352   BILIRUBINUR NEGATIVE 12/26/2015 2352   KETONESUR NEGATIVE 12/26/2015 2352   PROTEINUR NEGATIVE 12/26/2015 2352   UROBILINOGEN 0.2 10/09/2015 1015   NITRITE NEGATIVE 12/26/2015 2352   LEUKOCYTESUR NEGATIVE 12/26/2015 2352   Sepsis Labs: @LABRCNTIP (procalcitonin:4,lacticidven:4) )No results found for this or any previous visit (from the past 240 hour(s)).   Radiological Exams on Admission: Dg Chest 2 View  Result Date: 12/06/2017 CLINICAL DATA:  Shortness of breath for 2 weeks with productive cough EXAM: CHEST  2 VIEW COMPARISON:  05/29/2017 FINDINGS: Cardiac shadow is  within normal limits. The lungs are hyperinflated consistent with COPD. Stable right middle lobe collapse is noted. No acute infiltrate or sizable effusion is seen. No bony abnormality is noted. IMPRESSION: COPD with chronic changes in the right middle lobe. No acute abnormality noted. Electronically Signed   By: Alcide CleverMark  Lukens M.D.   On: 12/06/2017 20:15    EKG: Independently reviewed. Normal sinus rhythm.   Assessment/Plan  1. COPD with acute exacerbation  - Presents with increased SOB, cough, and sputum production  - No fever and no conspicuous consolidation on CXR  - Treated with steroids and nebs in ED, but continues to drop sat to low 80's with exertion  - Plan to continue ICS/LABA, continue prn albuterol nebs, continue supplemental O2 as needed, check sputum culture and continue systemic steroid, start azithromycin    2. Depression with anxiety  - Stable, continue Abilify and Zoloft    3. Mild renal  insufficiency  - SCr is 1.37 on admission, slightly up from priors more than a yr ago  - Renally-dose medications, avoid nephrotoxins where possible    DVT prophylaxis: Lovenox Code Status: Full  Family Communication: Discussed with patient Disposition Plan: Admit to med-surg Consults called: None Admission status: Inpatient    Briscoe Deutscherimothy S Lequita Meadowcroft, MD [facility] Pager [phone]  If 7PM-7AM, please contact night-coverage [email or web address] Password [email or web address]  12/06/2017, 10:14 PM

## 2017-12-07 DIAGNOSIS — Z79899 Other long term (current) drug therapy: Secondary | ICD-10-CM | POA: Diagnosis not present

## 2017-12-07 DIAGNOSIS — I081 Rheumatic disorders of both mitral and tricuspid valves: Secondary | ICD-10-CM | POA: Diagnosis not present

## 2017-12-07 DIAGNOSIS — J441 Chronic obstructive pulmonary disease with (acute) exacerbation: Secondary | ICD-10-CM | POA: Diagnosis not present

## 2017-12-07 DIAGNOSIS — F418 Other specified anxiety disorders: Secondary | ICD-10-CM

## 2017-12-07 DIAGNOSIS — I272 Pulmonary hypertension, unspecified: Secondary | ICD-10-CM | POA: Diagnosis not present

## 2017-12-07 DIAGNOSIS — N289 Disorder of kidney and ureter, unspecified: Secondary | ICD-10-CM

## 2017-12-07 LAB — CBC WITH DIFFERENTIAL/PLATELET
BASOS ABS: 0 10*3/uL (ref 0.0–0.1)
BASOS PCT: 0 %
EOS ABS: 0 10*3/uL (ref 0.0–0.7)
EOS PCT: 0 %
HCT: 43.3 % (ref 39.0–52.0)
Hemoglobin: 14.3 g/dL (ref 13.0–17.0)
Lymphocytes Relative: 9 %
Lymphs Abs: 0.8 10*3/uL (ref 0.7–4.0)
MCH: 31 pg (ref 26.0–34.0)
MCHC: 33 g/dL (ref 30.0–36.0)
MCV: 93.9 fL (ref 78.0–100.0)
MONO ABS: 0.1 10*3/uL (ref 0.1–1.0)
Monocytes Relative: 1 %
Neutro Abs: 8.1 10*3/uL — ABNORMAL HIGH (ref 1.7–7.7)
Neutrophils Relative %: 90 %
PLATELETS: 268 10*3/uL (ref 150–400)
RBC: 4.61 MIL/uL (ref 4.22–5.81)
RDW: 13.3 % (ref 11.5–15.5)
WBC: 9 10*3/uL (ref 4.0–10.5)

## 2017-12-07 LAB — BASIC METABOLIC PANEL
ANION GAP: 13 (ref 5–15)
BUN: 28 mg/dL — ABNORMAL HIGH (ref 6–20)
CALCIUM: 9.3 mg/dL (ref 8.9–10.3)
CO2: 23 mmol/L (ref 22–32)
Chloride: 104 mmol/L (ref 101–111)
Creatinine, Ser: 1.19 mg/dL (ref 0.61–1.24)
GLUCOSE: 128 mg/dL — AB (ref 65–99)
Potassium: 4.2 mmol/L (ref 3.5–5.1)
Sodium: 140 mmol/L (ref 135–145)

## 2017-12-07 MED ORDER — DEXTROSE 5 % IV SOLN: 1.0000 g | Freq: Once | INTRAVENOUS | Status: DC

## 2017-12-07 MED ORDER — AZITHROMYCIN 500 MG PO TABS: 500.0000 mg | ORAL_TABLET | Freq: Every day | ORAL | 0 refills | Status: AC

## 2017-12-07 MED ORDER — PREDNISONE 20 MG PO TABS: 40.0000 mg | ORAL_TABLET | Freq: Every day | ORAL | 0 refills | Status: AC

## 2017-12-07 NOTE — Care Management Note (Signed)
Case Management Note  Patient Details  Name: Carlton AdamLawrence E Deguzman MRN: [id number] Date of Birth: 01/07/1951  Subjective/Objective:                 Spoke w patient who states that he needs assistance with meds through Tuesday. Unfortunately [facility] not [facility], and Z fund is not available at [facility] (spoke w pharmacist). Patient aware that he can [facility] nost [facility]. He states that he enough in his inhalers to get through Tuesday. Patient will [facility] [place] tomorrow morning. Updated MD and patient on DC plan.    Action/Plan:   Expected Discharge Date:                  Expected Discharge Plan:  Home/Self Care  In-House Referral:     Discharge planning Services  CM Consult  Post Acute Care Choice:  Durable Medical Equipment Choice offered to:     DME Arranged:  Nebulizer/meds DME Agency:  [facility]  HH Arranged:    HH Agency:     Status of Service:  Completed, signed off  If discussed at Long Length of Stay Meetings, dates discussed:    Additional Comments:  Lawerance SabalDebbie Clotile Whittington, RN 12/07/2017, 12:08 PM

## 2017-12-07 NOTE — ED Notes (Signed)
Breakfast tray given to patient.

## 2017-12-07 NOTE — Discharge Summary (Signed)
Physician Discharge Summary  Vincent AdamLawrence E Jaggers MWN:[id number] DOB: 08/08/1951 DOA: 12/06/2017  PCP: Pearson GrippeKim, James, MD  Admit date: 12/06/2017 Discharge date: 12/07/2017  Admitted From: Home Disposition: Home  Recommendations for Outpatient Follow-up:  1. Follow up with PCP in 1-2 weeks 2. Get antibiotics from [facility] and use as prescribed 3. Nebulizer machine is available to you tomorrow 4. Please obtain BMP/CBC in one week  Home Health: None Equipment/Devices: Nebulizer machine  Discharge Condition: Stable CODE STATUS: Full code Diet recommendation: Regular diet  Brief/Interim Summary: Vincent Robertson is a [age] y.o. male with medical history significant for COPD and depression with anxiety, now presenting to the emergency department for evaluation of shortness of breath, productive cough, and general malaise.  Patient reports that his dyspnea worsened insidiouslyover the past couple weeks and his cough has increased as well as sputum production.  Denies fevers or chest pain, and denies lower extremity swelling or tenderness.  ED Course: Upon arrival to the ED, patient is found to be afebrile, saturating adequately on room air while at rest, slightly tachycardic, and with stable blood pressure.  EKG features normal sinus rhythm and chest x-ray is negative for acute cardiopulmonary disease, but notable for chronic COPD changes.  Chemistry panel reveals a creatinine of 1.37, slightly up from priors more than a year ago.  CBC is notable for a slight leukocytosis to 10,900 and troponin is undetectable.  Treated with 125 mg of IV Solu-Medrol, albuterol neb, and Rocephin in the ED.  O2 saturation dropped to 83% with exertion in the ED.  He will be admitted to the medical-surgical unit for ongoing evaluation and management of acute exacerbation in COPD.   Patient was admitted to the hospital.  On 12/07/2017 he was saturating well on room air.  He ambulated with nursing staff and his  oxygen saturation never fell below 93% on ambulation.  Patient voiced concerns about affording medication at time of discharge.  Case management discussed with patient that most medications will be on [facility] $4 list.  Unfortunately there is no medication [facility] which would allow patient to get 3 days worth of medication at time of discharge.  He was given 1 dose of azithromycin prior to discharge which would cover him for 24 hours and he was instructed to fill his antibiotic and get it by 12/08/2017.  And prescription for p.o. steroids was given at time of discharge.  A nebulizer machine was ordered.  Patient was instructed to quit smoking.  He was instructed to follow-up with his primary care physician.  Patient voices he was told there was a nodule seen on his x-ray however upon reviewing x-ray and report do not see any reported nodule.  X-ray does note stable right middle lobe collapse.  Patient was stable for discharge on 12/07/2017, he did not require supplemental oxygen and he was given a dose of antibiotics prior to discharge.  Discharge Diagnoses:  Principal Problem:   COPD exacerbation (HCC) Active Problems:   Mild renal insufficiency   Depression with anxiety   COPD with acute exacerbation [facility](HCC)    Discharge Instructions  Discharge Instructions    Call MD for:  difficulty breathing, headache or visual disturbances   Complete by:  As directed    Call MD for:  extreme fatigue   Complete by:  As directed    Call MD for:  hives   Complete by:  As directed    Call MD for:  persistant dizziness or  light-headedness   Complete by:  As directed    Call MD for:  persistant nausea and vomiting   Complete by:  As directed    Call MD for:  severe uncontrolled pain   Complete by:  As directed    Call MD for:  temperature >100.4   Complete by:  As directed    Diet - low sodium heart healthy   Complete by:  As directed    Discharge instructions   Complete by:  As  directed    Pick up prescriptions at [facility] and take as instructed   Increase activity slowly   Complete by:  As directed      Allergies as of 12/07/2017   No Known Allergies     Medication List    TAKE these medications   albuterol 108 (90 Base) MCG/ACT inhaler Commonly known as:  PROVENTIL HFA;VENTOLIN HFA Inhale 2 puffs into the lungs 3 (three) times daily.   albuterol (2.5 MG/3ML) 0.083% nebulizer solution Commonly known as:  PROVENTIL Take 3 mLs (2.5 mg total) by nebulization every 6 (six) hours as needed for wheezing.   ARIPiprazole 5 MG tablet Commonly known as:  ABILIFY Take 1 tablet (5 mg total) by mouth daily.   azithromycin 500 MG tablet Commonly known as:  ZITHROMAX Take 1 tablet (500 mg total) by mouth daily for 3 days. Take 1 tablet daily for 3 days. Start taking on:  12/08/2017   DULERA 100-5 MCG/ACT Aero Generic drug:  mometasone-formoterol Inhale 2 puffs into the lungs 2 (two) times daily.   ferrous sulfate 325 (65 FE) MG tablet Take 1 tablet (325 mg total) by mouth 2 (two) times daily with a meal. What changed:  when to take this   ibuprofen 800 MG tablet Commonly known as:  ADVIL,MOTRIN Take 800 mg by mouth every 8 (eight) hours as needed.   IRON PO Take 50 mg by mouth daily.   lovastatin 20 MG tablet Commonly known as:  MEVACOR Take 20 mg by mouth at bedtime.   MELATONIN PO Take by mouth at bedtime.   predniSONE 20 MG tablet Commonly known as:  DELTASONE Take 2 tablets (40 mg total) by mouth daily for 3 days. Start taking on:  12/08/2017   sertraline 100 MG tablet Commonly known as:  ZOLOFT Take 1 tablet (100 mg total) by mouth daily.      Follow-up Information    Pearson Grippe, MD. Schedule an appointment as soon as possible for a visit in 2 week(s).   Specialty:  Internal Medicine Contact information: [address] [place] [address] [phone]          No Known Allergies  Consultations:  None     Procedures/Studies: Dg Chest 2 View  Result Date: 12/06/2017 CLINICAL DATA:  Shortness of breath for 2 weeks with productive cough EXAM: CHEST  2 VIEW COMPARISON:  05/29/2017 FINDINGS: Cardiac shadow is within normal limits. The lungs are hyperinflated consistent with COPD. Stable right middle lobe collapse is noted. No acute infiltrate or sizable effusion is seen. No bony abnormality is noted. IMPRESSION: COPD with chronic changes in the right middle lobe. No acute abnormality noted. Electronically Signed   By: Alcide Clever M.D.   On: 12/06/2017 20:15      Subjective: Patient seen in a.m.  He voices that he is unable to afford his chronic medications and therefore will not be able to afford the medications that will be prescribed to him at time  of discharge.  Case management was called and medications are on [facility] $4 list.  Patient ambulated with nursing staff and oxygen saturation never decreased below 93%.  Discharge Exam: Vitals:   12/07/17 1142 12/07/17 1300  BP:  135/81  Pulse:  87  Resp:  18  Temp:  98.4 F (36.9 C)  SpO2: 94% 96%   Vitals:   12/07/17 0900 12/07/17 1139 12/07/17 1142 12/07/17 1300  BP:    135/81  Pulse:  (!) 104  87  Resp:    18  Temp:    98.4 F (36.9 C)  TempSrc:    Oral  SpO2:  96% 94% 96%  Weight: 46.9 kg (103 lb 6.3 oz)     Height: 5\' 4"  (1.626 m)       General: Pt is alert, awake, not in acute distress Cardiovascular: RRR, S1/S2 +, no rubs, no gallops Respiratory: CTA bilaterally, no wheezing, no rhonchi Abdominal: Soft, NT, ND, bowel sounds + Extremities: no edema, no cyanosis    The results of significant diagnostics from this hospitalization (including imaging, microbiology, ancillary and laboratory) are listed below for reference.     Microbiology: No results found for this or any previous visit (from the past 240 hour(s)).   Labs: BNP (last 3 results) No results for input(s): BNP in the last 8760 hours. Basic Metabolic  Panel: Recent Labs  Lab 12/06/17 2032 12/07/17 0510  NA 141 140  K 4.5 4.2  CL 104 104  CO2 26 23  GLUCOSE 98 128*  BUN 38* 28*  CREATININE 1.37* 1.19  CALCIUM 9.3 9.3   Liver Function Tests: No results for input(s): AST, ALT, ALKPHOS, BILITOT, PROT, ALBUMIN in the last 168 hours. No results for input(s): LIPASE, AMYLASE in the last 168 hours. No results for input(s): AMMONIA in the last 168 hours. CBC: Recent Labs  Lab 12/06/17 2032 12/07/17 0510  WBC 10.9* 9.0  NEUTROABS 7.5 8.1*  HGB 13.7 14.3  HCT 41.4 43.3  MCV 95.2 93.9  PLT 271 268   Cardiac Enzymes: Recent Labs  Lab 12/06/17 2032  TROPONINI <0.03   BNP: Invalid input(s): POCBNP CBG: No results for input(s): GLUCAP in the last 168 hours. D-Dimer No results for input(s): DDIMER in the last 72 hours. Hgb A1c No results for input(s): HGBA1C in the last 72 hours. Lipid Profile No results for input(s): CHOL, HDL, LDLCALC, TRIG, CHOLHDL, LDLDIRECT in the last 72 hours. Thyroid function studies No results for input(s): TSH, T4TOTAL, T3FREE, THYROIDAB in the last 72 hours.  Invalid input(s): FREET3 Anemia work up No results for input(s): VITAMINB12, FOLATE, FERRITIN, TIBC, IRON, RETICCTPCT in the last 72 hours. Urinalysis    Component Value Date/Time   COLORURINE YELLOW 12/26/2015 2352   APPEARANCEUR CLEAR 12/26/2015 2352   LABSPEC 1.020 12/26/2015 2352   PHURINE 7.0 12/26/2015 2352   GLUCOSEU NEGATIVE 12/26/2015 2352   HGBUR NEGATIVE 12/26/2015 2352   BILIRUBINUR NEGATIVE 12/26/2015 2352   KETONESUR NEGATIVE 12/26/2015 2352   PROTEINUR NEGATIVE 12/26/2015 2352   UROBILINOGEN 0.2 10/09/2015 1015   NITRITE NEGATIVE 12/26/2015 2352   LEUKOCYTESUR NEGATIVE 12/26/2015 2352   Sepsis Labs Invalid input(s): PROCALCITONIN,  WBC,  LACTICIDVEN Microbiology No results found for this or any previous visit (from the past 240 hour(s)).   Time coordinating discharge: 35 minutes  SIGNED:   Katrinka BlazingAlex U Kadolph,  MD  [facility] 12/07/2017, 2:50 PM Pager [phone] If 7PM-7AM, please contact night-coverage [email or web address] Password [email or web address]

## 2017-12-07 NOTE — Progress Notes (Signed)
Discharge instructions given to patient.  Patient expressed understanding.  Patient escorted down and [place]

## 2017-12-07 NOTE — Progress Notes (Signed)
Pt ambulated approx. 100 feet in hallway. O2 saturations ranged from 93-96% on room air. Pt still c/o shortness of breath. MD made aware.

## 2017-12-07 NOTE — Progress Notes (Signed)
Pt has an active discharge order. The lady he lives with is currently not home, he doesn't have a key and his friend doesn't have a spare one at the house anywhere. Taxi service would be his ride home. Due to the situation of his friend not being at home, and him not being able to get in touch with her (she doesn't have a cell phone), he cannot safely get into his friend's home. [facility]. Pt will continue to try and get in touch with his friend by 8 pm (as taxi service ends at 9 pm),to be able to get transportation to his friend's home. He states he doesn't know anyone in this town but her so he has no other transportation. If unable to get in touch with friend, pt may have to stay overnight and leave first thing in the morning or as soon as possible.  Will update night shift RN.

## 2017-12-11 DIAGNOSIS — J449 Chronic obstructive pulmonary disease, unspecified: Secondary | ICD-10-CM | POA: Diagnosis not present

## 2017-12-16 DIAGNOSIS — D649 Anemia, unspecified: Secondary | ICD-10-CM | POA: Diagnosis not present

## 2017-12-16 DIAGNOSIS — J449 Chronic obstructive pulmonary disease, unspecified: Secondary | ICD-10-CM | POA: Diagnosis not present

## 2017-12-16 DIAGNOSIS — N289 Disorder of kidney and ureter, unspecified: Secondary | ICD-10-CM | POA: Diagnosis not present

## 2017-12-16 DIAGNOSIS — D72829 Elevated white blood cell count, unspecified: Secondary | ICD-10-CM | POA: Diagnosis not present

## 2017-12-17 ENCOUNTER — Other Ambulatory Visit (HOSPITAL_COMMUNITY): Payer: Self-pay | Admitting: Family Medicine

## 2017-12-17 DIAGNOSIS — Z792 Long term (current) use of antibiotics: Secondary | ICD-10-CM

## 2017-12-17 DIAGNOSIS — Z79899 Other long term (current) drug therapy: Secondary | ICD-10-CM

## 2017-12-26 ENCOUNTER — Other Ambulatory Visit (HOSPITAL_COMMUNITY): Payer: Self-pay

## 2017-12-26 ENCOUNTER — Encounter (HOSPITAL_COMMUNITY): Payer: Self-pay

## 2018-01-11 DIAGNOSIS — J449 Chronic obstructive pulmonary disease, unspecified: Secondary | ICD-10-CM | POA: Diagnosis not present

## 2018-02-08 DIAGNOSIS — J449 Chronic obstructive pulmonary disease, unspecified: Secondary | ICD-10-CM | POA: Diagnosis not present

## 2018-02-17 DIAGNOSIS — E559 Vitamin D deficiency, unspecified: Secondary | ICD-10-CM | POA: Diagnosis not present

## 2018-02-17 DIAGNOSIS — D649 Anemia, unspecified: Secondary | ICD-10-CM | POA: Diagnosis not present

## 2018-02-17 DIAGNOSIS — E785 Hyperlipidemia, unspecified: Secondary | ICD-10-CM | POA: Diagnosis not present

## 2018-02-17 DIAGNOSIS — J449 Chronic obstructive pulmonary disease, unspecified: Secondary | ICD-10-CM | POA: Diagnosis not present

## 2018-03-11 DIAGNOSIS — J449 Chronic obstructive pulmonary disease, unspecified: Secondary | ICD-10-CM | POA: Diagnosis not present

## 2018-04-10 DIAGNOSIS — J449 Chronic obstructive pulmonary disease, unspecified: Secondary | ICD-10-CM | POA: Diagnosis not present

## 2018-05-11 DIAGNOSIS — J449 Chronic obstructive pulmonary disease, unspecified: Secondary | ICD-10-CM | POA: Diagnosis not present

## 2018-05-12 DIAGNOSIS — Z79899 Other long term (current) drug therapy: Secondary | ICD-10-CM | POA: Diagnosis not present

## 2018-05-12 DIAGNOSIS — J449 Chronic obstructive pulmonary disease, unspecified: Secondary | ICD-10-CM | POA: Diagnosis not present

## 2018-05-12 DIAGNOSIS — M545 Low back pain: Secondary | ICD-10-CM | POA: Diagnosis not present

## 2018-05-12 DIAGNOSIS — E785 Hyperlipidemia, unspecified: Secondary | ICD-10-CM | POA: Diagnosis not present

## 2018-06-10 DIAGNOSIS — J449 Chronic obstructive pulmonary disease, unspecified: Secondary | ICD-10-CM | POA: Diagnosis not present

## 2018-07-11 DIAGNOSIS — J449 Chronic obstructive pulmonary disease, unspecified: Secondary | ICD-10-CM | POA: Diagnosis not present

## 2018-08-11 DIAGNOSIS — J449 Chronic obstructive pulmonary disease, unspecified: Secondary | ICD-10-CM | POA: Diagnosis not present

## 2018-08-20 ENCOUNTER — Other Ambulatory Visit: Payer: Self-pay

## 2018-08-20 NOTE — Patient Outreach (Signed)
[facility] Care Management  08/20/2018  Vincent AdamLawrence E Robertson 08/21/1951 [id number]   Medication Adherence call to Mr. [name] patient telephone number is [facility]. Epic has a number but not sure if this if is a correct telephone left a message for patient to call back patient is due on Lovastatin 20 mg. Vincent Robertson is showing past due under [employer].  Vincent AbedAna Ollison-Moran CPhT Pharmacy Technician [facility] Care Management Direct Dial [phone]  Fax [phone] [email or web address]

## 2018-12-30 ENCOUNTER — Ambulatory Visit: Attending: Family Medicine | Primary: Family Medicine

## 2018-12-30 ENCOUNTER — Ambulatory Visit: Admit: 2018-12-30 | Discharge: 2018-12-30 | Attending: Family Medicine | Primary: Family Medicine

## 2018-12-30 DIAGNOSIS — J432 Centrilobular emphysema: Secondary | ICD-10-CM

## 2018-12-30 MED ORDER — MOMETASONE-FORMOTEROL HFA 200 MCG-5 MCG/ACTUATION AEROSOL INHALER
200-5 mcg/actuation | Freq: Two times a day (BID) | RESPIRATORY_TRACT | 5 refills | Status: DC
Start: 2018-12-30 — End: 2019-02-10

## 2018-12-30 MED ORDER — MIRTAZAPINE 15 MG TAB
15 mg | ORAL_TABLET | Freq: Every evening | ORAL | 5 refills | Status: DC
Start: 2018-12-30 — End: 2019-09-17

## 2018-12-30 MED ORDER — IBUPROFEN 800 MG TAB
800 mg | ORAL_TABLET | Freq: Three times a day (TID) | ORAL | 3 refills | Status: DC | PRN
Start: 2018-12-30 — End: 2019-08-19

## 2018-12-30 MED ORDER — ALBUTEROL SULFATE HFA 90 MCG/ACTUATION AEROSOL INHALER
90 mcg/actuation | Freq: Four times a day (QID) | RESPIRATORY_TRACT | 6 refills | Status: DC | PRN
Start: 2018-12-30 — End: 2020-05-17

## 2018-12-30 NOTE — Progress Notes (Signed)
Pt prefers to set up mychart at home.

## 2018-12-30 NOTE — ACP (Advance Care Planning) (Signed)
No adv directive

## 2018-12-30 NOTE — Progress Notes (Signed)
Caleb Robinson  [id number]  February 24, 1951    Chief Complaint:    Chief Complaint   Patient presents with   ??? Establish Care     [place]oved from [place]     History of Present Illness:     Caleb Robinson is here today to establish care.    Previously received medical care at: [place]    Has the following underlying problems: COPD/ emphysema, chronic pain  He is recovering from influenza A.  Treated 3 weeks ago with tamiflu.  Smoking 1 pack per day.  Total about 40 years.    Has always been thin, but now down to 89 lbs from 104 lbs when moved from [place] 6 months ago.  Labs done 2 weeks ago at the emergency department appeared normal.  No electrolyte abnormality, no anemia.  He says he is always  He says he is always been thin he attributes his weight loss to moving down here.  No longer has anyone cooking for him.  Working hard at his job at the [employer]    Currently taking the following medications:  Dulera, albuterol, ibuprofen    Last colonoscopy was: none    Main concern today is:  Establish care, med refills,       Review of Systems:  Review of Systems   Constitutional: Positive for weight loss. Negative for chills, fever and malaise/fatigue.   Eyes: Negative.    Respiratory: Positive for cough, sputum production, shortness of breath and wheezing. Negative for hemoptysis.    Cardiovascular: Negative.    Gastrointestinal: Negative.  Negative for abdominal pain, constipation, diarrhea, heartburn, nausea and vomiting.   Genitourinary: Negative.    Musculoskeletal: Positive for back pain, joint pain and neck pain. Negative for falls and myalgias.   Neurological: Negative.    Psychiatric/Behavioral: Negative for depression and suicidal ideas. The patient has insomnia. The patient is not nervous/anxious.        Medications:  Current Outpatient Medications   Medication Sig   ??? albuterol (PROVENTIL HFA, VENTOLIN HFA, PROAIR HFA) 90 mcg/actuation inhaler Take 2 Puffs by inhalation every six (6) hours as needed for  Wheezing.   ??? mometasone-formoterol (DULERA) 200-5 mcg/actuation HFA inhaler Take 2 Puffs by inhalation two (2) times a day.   ??? ibuprofen (MOTRIN) 800 mg tablet Take 1 Tab by mouth every eight (8) hours as needed for Pain.   ??? mirtazapine (REMERON) 15 mg tablet Take 1 Tab by mouth nightly. Indications: appetite stimulation     No current facility-administered medications for this visit.        Past Medical History:  Past Medical History:   Diagnosis Date   ??? Chronic obstructive pulmonary disease ([place])    ??? Emphysema lung ([place])        Surgical History:  History reviewed. No pertinent surgical history.    Family History:  Family History   Problem Relation Age of Onset   ??? Dementia Mother        Social History:  Social History     Socioeconomic History   ??? Marital status: SINGLE     Spouse name: Not on file   ??? Number of children: Not on file   ??? Years of education: Not on file   ??? Highest education level: Not on file   Occupational History   ??? Not on file   Social Needs   ??? Financial resource strain: Not on file   ??? Food insecurity:     Worry:  Not on file     Inability: Not on file   ??? Transportation needs:     Medical: Not on file     Non-medical: Not on file   Tobacco Use   ??? Smoking status: Current Every Day Smoker     Packs/day: 1.00     Years: 45.00     Pack years: 45.00   ??? Smokeless tobacco: Never Used   ??? Tobacco comment: stopped but restarted 6 mos ago   Substance and Sexual Activity   ??? Alcohol use: Yes     Alcohol/week: 1.0 standard drinks     Types: 1 Cans of beer per week     Comment: occ   ??? Drug use: Never   ??? Sexual activity: Not on file     Comment: widow   Lifestyle   ??? Physical activity:     Days per week: Not on file     Minutes per session: Not on file   ??? Stress: Not on file   Relationships   ??? Social connections:     Talks on phone: Not on file     Gets together: Not on file     Attends religious service: Not on file     Active member of club or organization: Not on file     Attends meetings of  clubs or organizations: Not on file     Relationship status: Not on file   ??? Intimate partner violence:     Fear of current or ex partner: Not on file     Emotionally abused: Not on file     Physically abused: Not on file     Forced sexual activity: Not on file   Other Topics Concern   ??? Not on file   Social History Narrative   ??? Not on file       Social History     Tobacco Use   Smoking Status Current Every Day Smoker   ??? Packs/day: 1.00   ??? Years: 45.00   ??? Pack years: 45.00   Smokeless Tobacco Never Used   Tobacco Comment    stopped but restarted 6 mos ago       Allergies:  No Known Allergies    Vital Signs:  Visit Vitals  BP 140/80 (BP 1 Location: Left arm, BP Patient Position: Sitting)   Pulse 88   Temp 98.5 ??F (36.9 ??C) (Oral)   Resp 14   Ht 5\' 4"  (1.626 m)   Wt 89 lb 4.8 oz (40.5 kg) Comment: no shoes   SpO2 96% Comment: ra   BMI 15.33 kg/m??     Body mass index is 15.33 kg/m??.    Lab results:  pending    Physical Exam:  Physical Exam  Vitals signs and nursing note reviewed.   Constitutional:       General: He is not in acute distress.     Appearance: He is cachectic. He is not ill-appearing, toxic-appearing or diaphoretic.   HENT:      Head: Normocephalic and atraumatic.      Right Ear: Tympanic membrane, ear canal and external ear normal.      Left Ear: Tympanic membrane, ear canal and external ear normal.      Mouth/Throat:      Mouth: Mucous membranes are moist.   Eyes:      Extraocular Movements: Extraocular movements intact.      Pupils: Pupils are equal, round, and reactive to light.  Neck:      Musculoskeletal: Normal range of motion and neck supple.      Thyroid: No thyromegaly.   Cardiovascular:      Rate and Rhythm: Normal rate and regular rhythm.   Pulmonary:      Effort: Pulmonary effort is normal. No respiratory distress.      Breath sounds: Wheezing present. No rales.   Musculoskeletal: Normal range of motion.         General: No swelling.   Skin:     General: Skin is warm and dry.       Capillary Refill: Capillary refill takes less than 2 seconds.   Neurological:      General: No focal deficit present.      Mental Status: He is alert and oriented to person, place, and time.   Psychiatric:         Mood and Affect: Mood and affect normal.         Behavior: Behavior normal.         Thought Content: Thought content normal.         Cognition and Memory: Memory normal.         Judgment: Judgment normal.           Assessment and Plan:  Diagnoses and all orders for this visit:    1. Centrilobular emphysema (HCC)  -     albuterol (PROVENTIL HFA, VENTOLIN HFA, PROAIR HFA) 90 mcg/actuation inhaler; Take 2 Puffs by inhalation every six (6) hours as needed for Wheezing.  -     mometasone-formoterol (DULERA) 200-5 mcg/actuation HFA inhaler; Take 2 Puffs by inhalation two (2) times a day.    2. Chronic bilateral low back pain without sciatica  -     ibuprofen (MOTRIN) 800 mg tablet; Take 1 Tab by mouth every eight (8) hours as needed for Pain.    3. Primary insomnia  -     mirtazapine (REMERON) 15 mg tablet; Take 1 Tab by mouth nightly. Indications: appetite stimulation  -     CBC WITH AUTOMATED DIFF; Future  -     METABOLIC PANEL, COMPREHENSIVE; Future  -     LIPID PANEL; Future  -     PSA, DIAGNOSTIC (PROSTATE SPECIFIC AG); Future  -     TSH 3RD GENERATION; Future    4. Abnormal loss of weight  -     mirtazapine (REMERON) 15 mg tablet; Take 1 Tab by mouth nightly. Indications: appetite stimulation  -     CBC WITH AUTOMATED DIFF; Future  -     METABOLIC PANEL, COMPREHENSIVE; Future  -     LIPID PANEL; Future  -     PSA, DIAGNOSTIC (PROSTATE SPECIFIC AG); Future  -     TSH 3RD GENERATION; Future    5. Colon cancer screening  -     COLOGUARD TEST (FECAL DNA COLORECTAL CANCER SCREENING)    6. Smoking          Patient Instructions   Assessment:  Caleb Robinson is here today to establish care.    Previously received medical care at: [place]    Has the following underlying problems: COPD/ emphysema, chronic  pain  He is recovering from influenza A.  Treated 3 weeks ago with tamiflu.  Smoking 1 pack per day.  Total about 40 years.    Has always been thin, but now down to 89 lbs from 104 lbs when moved from [place] 6 months ago.  Currently taking the following medications:  Dulera, albuterol, ibuprofen    Last colonoscopy was: none    Main concern today is:  Establish care, med refills,       PLAN:  1)  We will review records.  2)  Med refills as needed.  3)  We will send in cologuard.  They will mail to you.  4)  Start Remeron at night for sleep and appetite stimulation.  5)  Return in 2 months.  Labs one week before.        [name], MD

## 2018-12-30 NOTE — Patient Instructions (Addendum)
Assessment:  Caleb Robinson is here today to establish care.    Previously received medical care at: [place]    Has the following underlying problems: COPD/ emphysema, chronic pain  He is recovering from influenza A.  Treated 3 weeks ago with tamiflu.  Smoking 1 pack per day.  Total about 40 years.    Has always been thin, but now down to 89 lbs from 104 lbs when moved from [place] 6 months ago.    Currently taking the following medications:  Dulera, albuterol, ibuprofen    Last colonoscopy was: none    Main concern today is:  Establish care, med refills,       PLAN:  1)  We will review records.  2)  Med refills as needed.  3)  We will send in cologuard.  They will mail to you.  4)  Start Remeron at night for sleep and appetite stimulation.  5)  Return in 2 months.  Labs one week before.

## 2018-12-30 NOTE — Progress Notes (Signed)
Caleb Robinson  [id number]  March 04, 1951    Chief Complaint:    Chief Complaint   Patient presents with   ??? Establish Care     [place]     History of Present Illness:     Caleb Robinson is here today to establish care.    Previously received medical care at: [place]    Has the following underlying problems: COPD/ emphysema, chronic pain  He is recovering from influenza A.  Treated 3 weeks ago with tamiflu.  Smoking 1 pack per day.  Total about 40 years.    Has always been thin, but now down to 89 lbs from 104 lbs when moved from [place] 6 months ago.  Labs done 2 weeks ago at the emergency department appeared normal.  No electrolyte abnormality, no anemia.  He says he is always  He says he is always been thin he attributes his weight loss to moving down here.  No longer has anyone cooking for him.  Working hard at his job at the [employer]    Currently taking the following medications:  Dulera, albuterol, ibuprofen    Last colonoscopy was: none    Main concern today is:  Establish care, med refills,       Review of Systems:  Review of Systems   Constitutional: Positive for weight loss. Negative for chills, fever and malaise/fatigue.   Eyes: Negative.    Respiratory: Positive for cough, sputum production, shortness of breath and wheezing. Negative for hemoptysis.    Cardiovascular: Negative.    Gastrointestinal: Negative.  Negative for abdominal pain, constipation, diarrhea, heartburn, nausea and vomiting.   Genitourinary: Negative.    Musculoskeletal: Positive for back pain, joint pain and neck pain. Negative for falls and myalgias.   Neurological: Negative.    Psychiatric/Behavioral: Negative for depression and suicidal ideas. The patient has insomnia. The patient is not nervous/anxious.        Medications:  Current Outpatient Medications   Medication Sig   ??? albuterol (PROVENTIL HFA, VENTOLIN HFA, PROAIR HFA) 90 mcg/actuation inhaler Take 2 Puffs by inhalation every six (6) hours as needed for  Wheezing.   ??? mometasone-formoterol (DULERA) 200-5 mcg/actuation HFA inhaler Take 2 Puffs by inhalation two (2) times a day.   ??? ibuprofen (MOTRIN) 800 mg tablet Take 1 Tab by mouth every eight (8) hours as needed for Pain.   ??? mirtazapine (REMERON) 15 mg tablet Take 1 Tab by mouth nightly. Indications: appetite stimulation     No current facility-administered medications for this visit.        Past Medical History:  Past Medical History:   Diagnosis Date   ??? Chronic obstructive pulmonary disease ([place])    ??? Emphysema lung ([place])        Surgical History:  History reviewed. No pertinent surgical history.    Family History:  Family History   Problem Relation Age of Onset   ??? Dementia Mother        Social History:  Social History     Socioeconomic History   ??? Marital status: SINGLE     Spouse name: Not on file   ??? Number of children: Not on file   ??? Years of education: Not on file   ??? Highest education level: Not on file   Occupational History   ??? Not on file   Social Needs   ??? Financial resource strain: Not on file   ??? Food insecurity:     Worry:  Not on file     Inability: Not on file   ??? Transportation needs:     Medical: Not on file     Non-medical: Not on file   Tobacco Use   ??? Smoking status: Current Every Day Smoker     Packs/day: 1.00     Years: 45.00     Pack years: 45.00   ??? Smokeless tobacco: Never Used   ??? Tobacco comment: stopped but restarted 6 mos ago   Substance and Sexual Activity   ??? Alcohol use: Yes     Alcohol/week: 1.0 standard drinks     Types: 1 Cans of beer per week     Comment: occ   ??? Drug use: Never   ??? Sexual activity: Not on file     Comment: widow   Lifestyle   ??? Physical activity:     Days per week: Not on file     Minutes per session: Not on file   ??? Stress: Not on file   Relationships   ??? Social connections:     Talks on phone: Not on file     Gets together: Not on file     Attends religious service: Not on file     Active member of club or organization: Not on file      Attends meetings of clubs or organizations: Not on file     Relationship status: Not on file   ??? Intimate partner violence:     Fear of current or ex partner: Not on file     Emotionally abused: Not on file     Physically abused: Not on file     Forced sexual activity: Not on file   Other Topics Concern   ??? Not on file   Social History Narrative   ??? Not on file       Social History     Tobacco Use   Smoking Status Current Every Day Smoker   ??? Packs/day: 1.00   ??? Years: 45.00   ??? Pack years: 45.00   Smokeless Tobacco Never Used   Tobacco Comment    stopped but restarted 6 mos ago       Allergies:  No Known Allergies    Vital Signs:  Visit Vitals  BP 140/80 (BP 1 Location: Left arm, BP Patient Position: Sitting)   Pulse 88   Temp 98.5 ??F (36.9 ??C) (Oral)   Resp 14   Ht 5\' 4"  (1.626 m)   Wt 89 lb 4.8 oz (40.5 kg) Comment: no shoes   SpO2 96% Comment: ra   BMI 15.33 kg/m??     Body mass index is 15.33 kg/m??.    Lab results:  pending    Physical Exam:  Physical Exam  Vitals signs and nursing note reviewed.   Constitutional:       General: He is not in acute distress.     Appearance: He is cachectic. He is not ill-appearing, toxic-appearing or diaphoretic.   HENT:      Head: Normocephalic and atraumatic.      Right Ear: Tympanic membrane, ear canal and external ear normal.      Left Ear: Tympanic membrane, ear canal and external ear normal.      Mouth/Throat:      Mouth: Mucous membranes are moist.   Eyes:      Extraocular Movements: Extraocular movements intact.      Pupils: Pupils are equal, round, and reactive to light.  Neck:      Musculoskeletal: Normal range of motion and neck supple.      Thyroid: No thyromegaly.   Cardiovascular:      Rate and Rhythm: Normal rate and regular rhythm.   Pulmonary:      Effort: Pulmonary effort is normal. No respiratory distress.      Breath sounds: Wheezing present. No rales.   Musculoskeletal: Normal range of motion.         General: No swelling.   Skin:      General: Skin is warm and dry.      Capillary Refill: Capillary refill takes less than 2 seconds.   Neurological:      General: No focal deficit present.      Mental Status: He is alert and oriented to person, place, and time.   Psychiatric:         Mood and Affect: Mood and affect normal.         Behavior: Behavior normal.         Thought Content: Thought content normal.         Cognition and Memory: Memory normal.         Judgment: Judgment normal.           Assessment and Plan:  Diagnoses and all orders for this visit:    1. Centrilobular emphysema (HCC)  -     albuterol (PROVENTIL HFA, VENTOLIN HFA, PROAIR HFA) 90 mcg/actuation inhaler; Take 2 Puffs by inhalation every six (6) hours as needed for Wheezing.  -     mometasone-formoterol (DULERA) 200-5 mcg/actuation HFA inhaler; Take 2 Puffs by inhalation two (2) times a day.    2. Chronic bilateral low back pain without sciatica  -     ibuprofen (MOTRIN) 800 mg tablet; Take 1 Tab by mouth every eight (8) hours as needed for Pain.    3. Primary insomnia  -     mirtazapine (REMERON) 15 mg tablet; Take 1 Tab by mouth nightly. Indications: appetite stimulation  -     CBC WITH AUTOMATED DIFF; Future  -     METABOLIC PANEL, COMPREHENSIVE; Future  -     LIPID PANEL; Future  -     PSA, DIAGNOSTIC (PROSTATE SPECIFIC AG); Future  -     TSH 3RD GENERATION; Future    4. Abnormal loss of weight  -     mirtazapine (REMERON) 15 mg tablet; Take 1 Tab by mouth nightly. Indications: appetite stimulation  -     CBC WITH AUTOMATED DIFF; Future  -     METABOLIC PANEL, COMPREHENSIVE; Future  -     LIPID PANEL; Future  -     PSA, DIAGNOSTIC (PROSTATE SPECIFIC AG); Future  -     TSH 3RD GENERATION; Future    5. Colon cancer screening  -     COLOGUARD TEST (FECAL DNA COLORECTAL CANCER SCREENING)    6. Smoking          Patient Instructions   Assessment:  Caleb Robinson is here today to establish care.    Previously received medical care at: [place]     Has the following underlying problems: COPD/ emphysema, chronic pain  He is recovering from influenza A.  Treated 3 weeks ago with tamiflu.  Smoking 1 pack per day.  Total about 40 years.    Has always been thin, but now down to 89 lbs from 104 lbs when moved from [place] 6 months ago.  Currently taking the following medications:  Dulera, albuterol, ibuprofen    Last colonoscopy was: none    Main concern today is:  Establish care, med refills,       PLAN:  1)  We will review records.  2)  Med refills as needed.  3)  We will send in cologuard.  They will mail to you.  4)  Start Remeron at night for sleep and appetite stimulation.  5)  Return in 2 months.  Labs one week before.        [name], MD

## 2019-01-04 NOTE — Telephone Encounter (Signed)
Pt brought in name of neb medication 01-01-19: albuterol sulfate inhalation 2.5mg /7ml.

## 2019-02-10 ENCOUNTER — Encounter

## 2019-02-10 NOTE — Telephone Encounter (Signed)
Pt called requesting refills on dulera inhaler and albuterol nebulizer solution

## 2019-02-11 MED ORDER — MOMETASONE-FORMOTEROL HFA 200 MCG-5 MCG/ACTUATION AEROSOL INHALER
200-5 mcg/actuation | Freq: Two times a day (BID) | RESPIRATORY_TRACT | 5 refills | Status: DC
Start: 2019-02-11 — End: 2019-11-23

## 2019-02-11 MED ORDER — ALBUTEROL SULFATE 0.083 % (0.83 MG/ML) SOLN FOR INHALATION
2.5 mg /3 mL (0.083 %) | INHALATION_SOLUTION | Freq: Four times a day (QID) | RESPIRATORY_TRACT | 5 refills | Status: DC | PRN
Start: 2019-02-11 — End: 2019-11-23

## 2019-02-18 ENCOUNTER — Encounter: Primary: Family Medicine

## 2019-03-03 ENCOUNTER — Telehealth

## 2019-03-03 NOTE — Telephone Encounter (Signed)
Patient wants to know if there is something else he can add maybe in the am.  He takes the Remeron at night and if something else to add would be willing to add something in the morning   He is having some leg cramps. Please Advise

## 2019-03-03 NOTE — Telephone Encounter (Signed)
Pt is requesting something to take in the mornings for leg cramps. Pt takes mirtazapine at night to help.

## 2019-03-04 ENCOUNTER — Encounter: Attending: Family Medicine | Primary: Family Medicine

## 2019-03-04 MED ORDER — GABAPENTIN 100 MG CAP
100 mg | ORAL_CAPSULE | Freq: Two times a day (BID) | ORAL | 2 refills | Status: DC | PRN
Start: 2019-03-04 — End: 2019-08-19

## 2019-07-16 ENCOUNTER — Ambulatory Visit: Attending: Family Medicine | Primary: Family Medicine

## 2019-07-16 ENCOUNTER — Ambulatory Visit: Admit: 2019-07-16 | Discharge: 2019-07-16 | Payer: MEDICARE | Attending: Family Medicine | Primary: Family Medicine

## 2019-07-16 DIAGNOSIS — I10 Essential (primary) hypertension: Secondary | ICD-10-CM

## 2019-07-16 MED ORDER — AMLODIPINE 5 MG TAB
5 mg | ORAL_TABLET | Freq: Every day | ORAL | 2 refills | Status: DC
Start: 2019-07-16 — End: 2019-08-30

## 2019-07-16 MED ORDER — ASPIRIN 81 MG TAB, DELAYED RELEASE
81 mg | ORAL_TABLET | Freq: Every day | ORAL | 2 refills | Status: DC
Start: 2019-07-16 — End: 2019-11-23

## 2019-07-16 NOTE — Progress Notes (Signed)
Caleb Robinson  [id number]  05/23/1951    Chief Complaint:   Chief Complaint   Patient presents with   ??? Other     bright spot in vision; blacks out for 10- 20 seconds; vision pulsates before black out occurs   ??? Other     cramping; gabapentin not working     History of Present Illness:  [name] is here for a new issue.    Concern for 3 week h/o of 2 separate eye issues.  He has worn glasses for years.  Almost daily, he is experience a large bright spot in his central vision.    Lasts about 5-15 minutes.    He can still see around it but nothing in the middle of vision.  HE denies Has, LOC, pain with it.    Also he has had several instances where his vision will pulsate and then he goes black totally.   This does have a 10-20 minutes loc; no seizure activity; no HA.    BP is usually borderline high.  Has not been taking any medications.       Review of Systems:  Review of Systems   Constitutional: Negative for chills, fever, malaise/fatigue and weight loss.   Eyes: Positive for blurred vision and photophobia. Negative for double vision, pain, discharge and redness.   Respiratory: Negative.    Cardiovascular: Negative.    Gastrointestinal: Negative.    Musculoskeletal: Negative.    Neurological: Positive for loss of consciousness. Negative for dizziness, sensory change, speech change, focal weakness, seizures, weakness and headaches.   Psychiatric/Behavioral: Negative.        Medications:  Current Outpatient Medications   Medication Sig   ??? aspirin delayed-release 81 mg tablet Take 1 Tab by mouth daily.   ??? amLODIPine (NORVASC) 5 mg tablet Take 1 Tab by mouth daily.   ??? gabapentin (NEURONTIN) 100 mg capsule Take 1 Cap by mouth two (2) times daily as needed (Restless leg).   ??? mometasone-formoterol (DULERA) 200-5 mcg/actuation HFA inhaler Take 2 Puffs by inhalation two (2) times a day.   ??? albuterol (PROVENTIL VENTOLIN) 2.5 mg /3 mL (0.083 %) nebu 3 mL by Nebulization route every six (6) hours as needed for  Wheezing. One premix vial every 6hrs prn.   ??? albuterol (PROVENTIL HFA, VENTOLIN HFA, PROAIR HFA) 90 mcg/actuation inhaler Take 2 Puffs by inhalation every six (6) hours as needed for Wheezing.   ??? ibuprofen (MOTRIN) 800 mg tablet Take 1 Tab by mouth every eight (8) hours as needed for Pain.   ??? mirtazapine (REMERON) 15 mg tablet Take 1 Tab by mouth nightly. Indications: appetite stimulation     No current facility-administered medications for this visit.        Past Medical History:  Past Medical History:   Diagnosis Date   ??? Chronic obstructive pulmonary disease ([place])    ??? Emphysema lung ([place])        Social History:  Social History     Socioeconomic History   ??? Marital status: SINGLE     Spouse name: Not on file   ??? Number of children: Not on file   ??? Years of education: Not on file   ??? Highest education level: Not on file   Occupational History   ??? Not on file   Social Needs   ??? Financial resource strain: Not on file   ??? Food insecurity     Worry: Not on file     Inability: Not  on file   ??? Transportation needs     Medical: Not on file     Non-medical: Not on file   Tobacco Use   ??? Smoking status: Current Every Day Smoker     Packs/day: 1.00     Years: 45.00     Pack years: 45.00   ??? Smokeless tobacco: Never Used   ??? Tobacco comment: stopped but restarted 6 mos ago   Substance and Sexual Activity   ??? Alcohol use: Yes     Alcohol/week: 1.0 standard drinks     Types: 1 Cans of beer per week     Comment: occ   ??? Drug use: Never   ??? Sexual activity: Not on file     Comment: widow   Lifestyle   ??? Physical activity     Days per week: Not on file     Minutes per session: Not on file   ??? Stress: Not on file   Relationships   ??? Social Product manager on phone: Not on file     Gets together: Not on file     Attends religious service: Not on file     Active member of club or organization: Not on file     Attends meetings of clubs or organizations: Not on file     Relationship status: Not on file   ??? Intimate partner  violence     Fear of current or ex partner: Not on file     Emotionally abused: Not on file     Physically abused: Not on file     Forced sexual activity: Not on file   Other Topics Concern   ??? Not on file   Social History Narrative   ??? Not on file       Social History     Tobacco Use   Smoking Status Current Every Day Smoker   ??? Packs/day: 1.00   ??? Years: 45.00   ??? Pack years: 45.00   Smokeless Tobacco Never Used   Tobacco Comment    stopped but restarted 6 mos ago       Allergies:  No Known Allergies    Vital Signs:  Vitals:    07/16/19 1202   BP: 150/71   Pulse: 72   Resp: 16   Temp: 98.5 ??F (36.9 ??C)   TempSrc: Temporal   SpO2: 98%   Weight: 100 lb 4.8 oz (45.5 kg)   Height: 5' 5.25" (1.657 m)     Body mass index is 16.56 kg/m??.    Lab/Imaging Results:  pending    Physical Exam:  Physical Exam  Vitals signs and nursing note reviewed.   Constitutional:       General: He is not in acute distress.  HENT:      Head: Normocephalic and atraumatic.   Eyes:      General: Lids are everted, no foreign bodies appreciated. Vision grossly intact.      Extraocular Movements:      Right eye: Normal extraocular motion.      Left eye: Normal extraocular motion.      Conjunctiva/sclera: Conjunctivae normal.      Pupils: Pupils are equal.      Right eye: Pupil is reactive.      Left eye: Pupil is reactive.      Visual Fields: Right eye visual fields normal and left eye visual fields normal.      Comments: Fundoscopic exam inconclsive due  to inability to dilate eye   Neck:      Musculoskeletal: Normal range of motion and neck supple.      Thyroid: No thyromegaly.   Cardiovascular:      Rate and Rhythm: Normal rate and regular rhythm.   Pulmonary:      Effort: Pulmonary effort is normal.      Breath sounds: Normal breath sounds.   Skin:     General: Skin is warm and dry.      Capillary Refill: Capillary refill takes less than 2 seconds.   Neurological:      General: No focal deficit present.      Mental Status: He is alert and  oriented to person, place, and time. Mental status is at baseline.      Cranial Nerves: No cranial nerve deficit.      Sensory: No sensory deficit.      Motor: No weakness.      Coordination: Coordination normal.      Gait: Gait normal.      Deep Tendon Reflexes: Reflexes normal.   Psychiatric:         Mood and Affect: Mood and affect normal.         Behavior: Behavior normal.         Thought Content: Thought content normal.         Cognition and Memory: Memory normal.         Judgment: Judgment normal.           Assessment and Plan:  Diagnoses and all orders for this visit:    1. Essential hypertension  -     aspirin delayed-release 81 mg tablet; Take 1 Tab by mouth daily.  -     amLODIPine (NORVASC) 5 mg tablet; Take 1 Tab by mouth daily.    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 Transient blindness of both eyes  -     REFERRAL TO OPHTHALMOLOGY  -     CT HEAD W WO CONT; Future          Patient Instructions   A:  Caleb Robinson is here today for transient vision loss  Seen as a new patient 7 months ago.  History of emphysema, weight loss.           PLAN:  1)  Referral to opthamology.  2)  CT scan of head.  3)  Start aspirin a day and blood pressure medication.  4)  3 week follow up.        [name], MD

## 2019-07-16 NOTE — Patient Instructions (Addendum)
A:  Caleb Robinson is here today for vision issue.    Seen as a new patient 7 months ago.  History of emphysema, weight loss.           PLAN:  1)  Referral to opthamology.  2)  CT scan of head.  3)  Start aspirin a day and blood pressure medication.  4)  3 week follow up.

## 2019-07-16 NOTE — Progress Notes (Signed)
Caleb Robinson  [id number]  1951/05/16    Chief Complaint:   Chief Complaint   Patient presents with   ??? Other     bright spot in vision; blacks out for 10- 20 seconds; vision pulsates before black out occurs   ??? Other     cramping; gabapentin not working     History of Present Illness:  [name] is here for a new issue.    Concern for 3 week h/o of 2 separate eye issues.  He has worn glasses for years.  Almost daily, he is experience a large bright spot in his central vision.    Lasts about 5-15 minutes.    He can still see around it but nothing in the middle of vision.  HE denies Has, LOC, pain with it.    Also he has had several instances where his vision will pulsate and then he goes black totally.   This does have a 10-20 minutes loc; no seizure activity; no HA.    BP is usually borderline high.  Has not been taking any medications.       Review of Systems:  Review of Systems   Constitutional: Negative for chills, fever, malaise/fatigue and weight loss.   Eyes: Positive for blurred vision and photophobia. Negative for double vision, pain, discharge and redness.   Respiratory: Negative.    Cardiovascular: Negative.    Gastrointestinal: Negative.    Musculoskeletal: Negative.    Neurological: Positive for loss of consciousness. Negative for dizziness, sensory change, speech change, focal weakness, seizures, weakness and headaches.   Psychiatric/Behavioral: Negative.        Medications:  Current Outpatient Medications   Medication Sig   ??? aspirin delayed-release 81 mg tablet Take 1 Tab by mouth daily.   ??? amLODIPine (NORVASC) 5 mg tablet Take 1 Tab by mouth daily.   ??? gabapentin (NEURONTIN) 100 mg capsule Take 1 Cap by mouth two (2) times daily as needed (Restless leg).   ??? mometasone-formoterol (DULERA) 200-5 mcg/actuation HFA inhaler Take 2 Puffs by inhalation two (2) times a day.   ??? albuterol (PROVENTIL VENTOLIN) 2.5 mg /3 mL (0.083 %) nebu 3 mL by  Nebulization route every six (6) hours as needed for Wheezing. One premix vial every 6hrs prn.   ??? albuterol (PROVENTIL HFA, VENTOLIN HFA, PROAIR HFA) 90 mcg/actuation inhaler Take 2 Puffs by inhalation every six (6) hours as needed for Wheezing.   ??? ibuprofen (MOTRIN) 800 mg tablet Take 1 Tab by mouth every eight (8) hours as needed for Pain.   ??? mirtazapine (REMERON) 15 mg tablet Take 1 Tab by mouth nightly. Indications: appetite stimulation     No current facility-administered medications for this visit.        Past Medical History:  Past Medical History:   Diagnosis Date   ??? Chronic obstructive pulmonary disease ([place])    ??? Emphysema lung ([place])        Social History:  Social History     Socioeconomic History   ??? Marital status: SINGLE     Spouse name: Not on file   ??? Number of children: Not on file   ??? Years of education: Not on file   ??? Highest education level: Not on file   Occupational History   ??? Not on file   Social Needs   ??? Financial resource strain: Not on file   ??? Food insecurity     Worry: Not on file     Inability: Not  on file   ??? Transportation needs     Medical: Not on file     Non-medical: Not on file   Tobacco Use   ??? Smoking status: Current Every Day Smoker     Packs/day: 1.00     Years: 45.00     Pack years: 45.00   ??? Smokeless tobacco: Never Used   ??? Tobacco comment: stopped but restarted 6 mos ago   Substance and Sexual Activity   ??? Alcohol use: Yes     Alcohol/week: 1.0 standard drinks     Types: 1 Cans of beer per week     Comment: occ   ??? Drug use: Never   ??? Sexual activity: Not on file     Comment: widow   Lifestyle   ??? Physical activity     Days per week: Not on file     Minutes per session: Not on file   ??? Stress: Not on file   Relationships   ??? Social Product manager on phone: Not on file     Gets together: Not on file     Attends religious service: Not on file     Active member of club or organization: Not on file     Attends meetings of clubs or organizations: Not on file      Relationship status: Not on file   ??? Intimate partner violence     Fear of current or ex partner: Not on file     Emotionally abused: Not on file     Physically abused: Not on file     Forced sexual activity: Not on file   Other Topics Concern   ??? Not on file   Social History Narrative   ??? Not on file       Social History     Tobacco Use   Smoking Status Current Every Day Smoker   ??? Packs/day: 1.00   ??? Years: 45.00   ??? Pack years: 45.00   Smokeless Tobacco Never Used   Tobacco Comment    stopped but restarted 6 mos ago       Allergies:  No Known Allergies    Vital Signs:  Vitals:    07/16/19 1202   BP: 150/71   Pulse: 72   Resp: 16   Temp: 98.5 ??F (36.9 ??C)   TempSrc: Temporal   SpO2: 98%   Weight: 100 lb 4.8 oz (45.5 kg)   Height: 5' 5.25" (1.657 m)     Body mass index is 16.56 kg/m??.    Lab/Imaging Results:  pending    Physical Exam:  Physical Exam  Vitals signs and nursing note reviewed.   Constitutional:       General: He is not in acute distress.  HENT:      Head: Normocephalic and atraumatic.   Eyes:      General: Lids are everted, no foreign bodies appreciated. Vision grossly intact.      Extraocular Movements:      Right eye: Normal extraocular motion.      Left eye: Normal extraocular motion.      Conjunctiva/sclera: Conjunctivae normal.      Pupils: Pupils are equal.      Right eye: Pupil is reactive.      Left eye: Pupil is reactive.      Visual Fields: Right eye visual fields normal and left eye visual fields normal.      Comments: Fundoscopic exam inconclsive due  to inability to dilate eye   Neck:      Musculoskeletal: Normal range of motion and neck supple.      Thyroid: No thyromegaly.   Cardiovascular:      Rate and Rhythm: Normal rate and regular rhythm.   Pulmonary:      Effort: Pulmonary effort is normal.      Breath sounds: Normal breath sounds.   Skin:     General: Skin is warm and dry.      Capillary Refill: Capillary refill takes less than 2 seconds.   Neurological:       General: No focal deficit present.      Mental Status: He is alert and oriented to person, place, and time. Mental status is at baseline.      Cranial Nerves: No cranial nerve deficit.      Sensory: No sensory deficit.      Motor: No weakness.      Coordination: Coordination normal.      Gait: Gait normal.      Deep Tendon Reflexes: Reflexes normal.   Psychiatric:         Mood and Affect: Mood and affect normal.         Behavior: Behavior normal.         Thought Content: Thought content normal.         Cognition and Memory: Memory normal.         Judgment: Judgment normal.           Assessment and Plan:  Diagnoses and all orders for this visit:    1. Essential hypertension  -     aspirin delayed-release 81 mg tablet; Take 1 Tab by mouth daily.  -     amLODIPine (NORVASC) 5 mg tablet; Take 1 Tab by mouth daily.    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 Transient blindness of both eyes  -     REFERRAL TO OPHTHALMOLOGY  -     CT HEAD W WO CONT; Future          Patient Instructions   A:  Travonte Byard is here today for transient vision loss  Seen as a new patient 7 months ago.  History of emphysema, weight loss.           PLAN:  1)  Referral to opthamology.  2)  CT scan of head.  3)  Start aspirin a day and blood pressure medication.  4)  3 week follow up.        [name], MD

## 2019-08-05 ENCOUNTER — Ambulatory Visit: Attending: Family Medicine | Primary: Family Medicine

## 2019-08-05 ENCOUNTER — Ambulatory Visit: Admit: 2019-08-05 | Discharge: 2019-08-05 | Payer: MEDICARE | Attending: Family Medicine | Primary: Family Medicine

## 2019-08-05 DIAGNOSIS — H547 Unspecified visual loss: Secondary | ICD-10-CM

## 2019-08-05 NOTE — Progress Notes (Signed)
No charge visit.  Patient was seen 3 weeks ago for intermittent vision loss, syncopal episodes.    (He is not having any symptoms at this time.)    He was sent for a CT scan and refer to ophthalmology.    Apparently he went to neither due to the fact that he does not answer his phone.    Patient given phone numbers for [facility] CT scheduling and comes in I.  He will call both of them and get CT scan scheduled and ophthalmology visit.

## 2019-08-05 NOTE — Progress Notes (Signed)
No charge visit.  Patient was seen 3 weeks ago for intermittent vision loss, syncopal episodes.    (He is not having any symptoms at this time.)    He was sent for a CT scan and refer to ophthalmology.    Apparently he went to neither due to the fact that he does not answer his phone.    Patient given phone numbers for [facility] CT scheduling and comes in I.  He will call both of them and get CT scan scheduled and ophthalmology visit.

## 2019-08-12 ENCOUNTER — Inpatient Hospital Stay: Admit: 2019-08-12 | Payer: MEDICARE | Attending: Family Medicine | Primary: Family Medicine

## 2019-08-12 DIAGNOSIS — H53423 Scotoma of blind spot area, bilateral: Secondary | ICD-10-CM

## 2019-08-19 ENCOUNTER — Encounter

## 2019-08-19 MED ORDER — GABAPENTIN 100 MG CAP
100 mg | ORAL_CAPSULE | Freq: Three times a day (TID) | ORAL | 2 refills | Status: DC | PRN
Start: 2019-08-19 — End: 2019-11-23

## 2019-08-19 MED ORDER — IBUPROFEN 800 MG TAB
800 mg | ORAL_TABLET | Freq: Three times a day (TID) | ORAL | 3 refills | Status: DC | PRN
Start: 2019-08-19 — End: 2019-11-23

## 2019-08-19 NOTE — Telephone Encounter (Signed)
Pt called requesting refill on Ibuprofen 800 and an increase in dosage of gabapentin due to still having severe leg cramps.

## 2019-08-30 ENCOUNTER — Ambulatory Visit: Attending: Family Medicine | Primary: Family Medicine

## 2019-08-30 ENCOUNTER — Ambulatory Visit: Admit: 2019-08-30 | Discharge: 2019-08-30 | Payer: MEDICARE | Attending: Family Medicine | Primary: Family Medicine

## 2019-08-30 DIAGNOSIS — H53413 Scotoma involving central area, bilateral: Secondary | ICD-10-CM

## 2019-08-30 MED ORDER — AMLODIPINE 5 MG TAB
5 mg | ORAL_TABLET | Freq: Every day | ORAL | 3 refills | Status: DC
Start: 2019-08-30 — End: 2020-05-17

## 2019-08-30 MED ORDER — ROSUVASTATIN 10 MG TAB
10 mg | ORAL_TABLET | Freq: Every evening | ORAL | 3 refills | Status: DC
Start: 2019-08-30 — End: 2020-05-17

## 2019-08-30 NOTE — Progress Notes (Signed)
Caleb Robinson  [id number]  03-13-1951    Chief Complaint:   Chief Complaint   Patient presents with   ??? Follow-up     History of Present Illness:     Caleb Robinson is here today for 3-week follow-up after being seen for loss of vision.    Prior Note:  "Concern for 3 week h/o of 2 separate eye issues.  He has worn glasses for years.  Almost daily, he is experience a large bright spot in his central vision.    Lasts about 5-15 minutes.    He can still see around it but nothing in the middle of vision.  HE denies Has, LOC, pain with it."    NOW:  CT scan negative.  He saw opthalmology and they determined his visual disturbances were not coming from his eyes.    He reports he is still having the central visin loss about 1x per day.  Last 30-90 seconds.    He had normal lipid levels in July at [place].    Taking 81 mg aspirin a day.  Blood pressure controlled with amlodipine.  He is smoking about 1/2 pack per day.  Has cut down from 1 ppd.               Review of Systems:  Review of Systems   Constitutional: Negative for chills, fever, malaise/fatigue and weight loss.   Eyes: Negative for double vision, photophobia, pain and discharge.        Visual loss   Respiratory: Negative.    Cardiovascular: Negative.    Musculoskeletal: Negative.    Skin: Negative.    Neurological: Negative for headaches.   Psychiatric/Behavioral: Negative.        Medications:  Current Outpatient Medications   Medication Sig   ??? amLODIPine (NORVASC) 5 mg tablet Take 1 Tab by mouth daily.   ??? rosuvastatin (CRESTOR) 10 mg tablet Take 1 Tab by mouth nightly.   ??? ibuprofen (MOTRIN) 800 mg tablet Take 1 Tab by mouth every eight (8) hours as needed for Pain.   ??? gabapentin (NEURONTIN) 100 mg capsule Take 2 Caps by mouth three (3) times daily as needed for Pain (Restless leg).   ??? aspirin delayed-release 81 mg tablet Take 1 Tab by mouth daily.   ??? mometasone-formoterol (DULERA) 200-5 mcg/actuation HFA inhaler Take 2 Puffs by inhalation two (2)  times a day.   ??? albuterol (PROVENTIL VENTOLIN) 2.5 mg /3 mL (0.083 %) nebu 3 mL by Nebulization route every six (6) hours as needed for Wheezing. One premix vial every 6hrs prn.   ??? albuterol (PROVENTIL HFA, VENTOLIN HFA, PROAIR HFA) 90 mcg/actuation inhaler Take 2 Puffs by inhalation every six (6) hours as needed for Wheezing.   ??? mirtazapine (REMERON) 15 mg tablet Take 1 Tab by mouth nightly. Indications: appetite stimulation     No current facility-administered medications for this visit.        Past Medical History:  Past Medical History:   Diagnosis Date   ??? Chronic obstructive pulmonary disease ([place])    ??? Emphysema lung ([place])        Social History:  Social History     Socioeconomic History   ??? Marital status: SINGLE     Spouse name: Not on file   ??? Number of children: Not on file   ??? Years of education: Not on file   ??? Highest education level: Not on file   Occupational History   ??? Not on file  Social Needs   ??? Financial resource strain: Not on file   ??? Food insecurity     Worry: Not on file     Inability: Not on file   ??? Transportation needs     Medical: Not on file     Non-medical: Not on file   Tobacco Use   ??? Smoking status: Current Every Day Smoker     Packs/day: 1.00     Years: 45.00     Pack years: 45.00   ??? Smokeless tobacco: Never Used   ??? Tobacco comment: stopped but restarted 6 mos ago   Substance and Sexual Activity   ??? Alcohol use: Yes     Alcohol/week: 1.0 standard drinks     Types: 1 Cans of beer per week     Comment: occ   ??? Drug use: Never   ??? Sexual activity: Not on file     Comment: widow   Lifestyle   ??? Physical activity     Days per week: Not on file     Minutes per session: Not on file   ??? Stress: Not on file   Relationships   ??? Social Product manager on phone: Not on file     Gets together: Not on file     Attends religious service: Not on file     Active member of club or organization: Not on file     Attends meetings of clubs or organizations: Not on file     Relationship  status: Not on file   ??? Intimate partner violence     Fear of current or ex partner: Not on file     Emotionally abused: Not on file     Physically abused: Not on file     Forced sexual activity: Not on file   Other Topics Concern   ??? Not on file   Social History Narrative   ??? Not on file       Social History     Tobacco Use   Smoking Status Current Every Day Smoker   ??? Packs/day: 1.00   ??? Years: 45.00   ??? Pack years: 45.00   Smokeless Tobacco Never Used   Tobacco Comment    stopped but restarted 6 mos ago       Allergies:  No Known Allergies    Vital Signs:  Vitals:    08/30/19 0831   BP: 136/74   Pulse: 70   Resp: 16   Temp: 98.2 ??F (36.8 ??C)   TempSrc: Temporal   SpO2: 97%   Weight: 100 lb 11.2 oz (45.7 kg)   Height: 5' 5.25" (1.657 m)     Body mass index is 16.63 kg/m??.    Lab/Imaging Results:  pending    Physical Exam:  Physical Exam  Vitals signs and nursing note reviewed.   Constitutional:       General: He is not in acute distress.  HENT:      Head: Normocephalic and atraumatic.   Neck:      Musculoskeletal: Normal range of motion and neck supple.      Thyroid: No thyromegaly.   Cardiovascular:      Rate and Rhythm: Normal rate and regular rhythm.   Pulmonary:      Effort: Pulmonary effort is normal.      Breath sounds: Normal breath sounds.   Skin:     General: Skin is warm and dry.   Neurological:  General: No focal deficit present.      Mental Status: He is alert and oriented to person, place, and time.      Cranial Nerves: No cranial nerve deficit.      Sensory: No sensory deficit.      Motor: No weakness.      Coordination: Coordination normal.      Gait: Gait normal.   Psychiatric:         Mood and Affect: Mood and affect normal.         Behavior: Behavior normal.         Thought Content: Thought content normal.         Cognition and Memory: Memory normal.         Judgment: Judgment normal.           Assessment and Plan:  Diagnoses and all orders for this visit:    1. Central loss of vision,  bilateral  -     REFERRAL TO NEUROLOGY  -     MRA BRAIN W WO CONT; Future    2. Essential hypertension  -     amLODIPine (NORVASC) 5 mg tablet; Take 1 Tab by mouth daily.  -     rosuvastatin (CRESTOR) 10 mg tablet; Take 1 Tab by mouth nightly.    3. 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    4. Smoking  -     rosuvastatin (CRESTOR) 10 mg tablet; Take 1 Tab by mouth nightly.    5. Vision loss            Patient Instructions   A:  Sartaj Sitter is here today for 3-week follow-up after being seen for loss of vision.  CT scan negative.  He saw opthalmology and they determined his visual disturbances were not coming from his eyes.    He reports he is still having the central visin loss about 1x per day.  Last 30-90 seconds.    He had normal lipid levels in July at [place].    Taking 81 mg aspirin a day.  Blood pressure controlled with amlodipine.  He is smoking about 1/2 pack per day.  Has cut down from 1 ppd.            PLAN:  1)  Quit smoking.  Continue all medications.  START cholesterol meds.  2)  We will schedule for MRI to get better look at brain.  3)  Referral to neurology.  4)  3 months medicare wellness visit.        [name], MD

## 2019-08-30 NOTE — Patient Instructions (Addendum)
A:  Caleb Robinson is here today for 3-week follow-up after being seen for loss of vision.  CT scan negative.  He saw opthalmology and they determined his visual disturbances were not coming from his eyes.    He reports he is still having the central visin loss about 1x per day.  Last 30-90 seconds.    He had normal lipid levels in July at [place].    Taking 81 mg aspirin a day.  Blood pressure controlled with amlodipine.  He is smoking about 1/2 pack per day.  Has cut down from 1 ppd.            PLAN:  1)  Quit smoking.  Continue all medications.  2)  We will schedule for MRI to get better look at brain.  3)  Referral to neurology.  4)  3 months medicare wellness visit.

## 2019-08-30 NOTE — Progress Notes (Signed)
Caleb Robinson  [id number]  1951/07/31    Chief Complaint:   Chief Complaint   Patient presents with   ??? Follow-up     History of Present Illness:     Caleb Robinson is here today for 3-week follow-up after being seen for loss of vision.    Prior Note:  "Concern for 3 week h/o of 2 separate eye issues.  He has worn glasses for years.  Almost daily, he is experience a large bright spot in his central vision.    Lasts about 5-15 minutes.    He can still see around it but nothing in the middle of vision.  HE denies Has, LOC, pain with it."    NOW:  CT scan negative.  He saw opthalmology and they determined his visual disturbances were not coming from his eyes.    He reports he is still having the central visin loss about 1x per day.  Last 30-90 seconds.    He had normal lipid levels in July at [place].    Taking 81 mg aspirin a day.  Blood pressure controlled with amlodipine.  He is smoking about 1/2 pack per day.  Has cut down from 1 ppd.               Review of Systems:  Review of Systems   Constitutional: Negative for chills, fever, malaise/fatigue and weight loss.   Eyes: Negative for double vision, photophobia, pain and discharge.        Visual loss   Respiratory: Negative.    Cardiovascular: Negative.    Musculoskeletal: Negative.    Skin: Negative.    Neurological: Negative for headaches.   Psychiatric/Behavioral: Negative.        Medications:  Current Outpatient Medications   Medication Sig   ??? amLODIPine (NORVASC) 5 mg tablet Take 1 Tab by mouth daily.   ??? rosuvastatin (CRESTOR) 10 mg tablet Take 1 Tab by mouth nightly.   ??? ibuprofen (MOTRIN) 800 mg tablet Take 1 Tab by mouth every eight (8) hours as needed for Pain.   ??? gabapentin (NEURONTIN) 100 mg capsule Take 2 Caps by mouth three (3) times daily as needed for Pain (Restless leg).   ??? aspirin delayed-release 81 mg tablet Take 1 Tab by mouth daily.   ??? mometasone-formoterol (DULERA) 200-5 mcg/actuation HFA inhaler Take 2  Puffs by inhalation two (2) times a day.   ??? albuterol (PROVENTIL VENTOLIN) 2.5 mg /3 mL (0.083 %) nebu 3 mL by Nebulization route every six (6) hours as needed for Wheezing. One premix vial every 6hrs prn.   ??? albuterol (PROVENTIL HFA, VENTOLIN HFA, PROAIR HFA) 90 mcg/actuation inhaler Take 2 Puffs by inhalation every six (6) hours as needed for Wheezing.   ??? mirtazapine (REMERON) 15 mg tablet Take 1 Tab by mouth nightly. Indications: appetite stimulation     No current facility-administered medications for this visit.        Past Medical History:  Past Medical History:   Diagnosis Date   ??? Chronic obstructive pulmonary disease ([place])    ??? Emphysema lung ([place])        Social History:  Social History     Socioeconomic History   ??? Marital status: SINGLE     Spouse name: Not on file   ??? Number of children: Not on file   ??? Years of education: Not on file   ??? Highest education level: Not on file   Occupational History   ??? Not on file  Social Needs   ??? Financial resource strain: Not on file   ??? Food insecurity     Worry: Not on file     Inability: Not on file   ??? Transportation needs     Medical: Not on file     Non-medical: Not on file   Tobacco Use   ??? Smoking status: Current Every Day Smoker     Packs/day: 1.00     Years: 45.00     Pack years: 45.00   ??? Smokeless tobacco: Never Used   ??? Tobacco comment: stopped but restarted 6 mos ago   Substance and Sexual Activity   ??? Alcohol use: Yes     Alcohol/week: 1.0 standard drinks     Types: 1 Cans of beer per week     Comment: occ   ??? Drug use: Never   ??? Sexual activity: Not on file     Comment: widow   Lifestyle   ??? Physical activity     Days per week: Not on file     Minutes per session: Not on file   ??? Stress: Not on file   Relationships   ??? Social Product manager on phone: Not on file     Gets together: Not on file     Attends religious service: Not on file     Active member of club or organization: Not on file      Attends meetings of clubs or organizations: Not on file     Relationship status: Not on file   ??? Intimate partner violence     Fear of current or ex partner: Not on file     Emotionally abused: Not on file     Physically abused: Not on file     Forced sexual activity: Not on file   Other Topics Concern   ??? Not on file   Social History Narrative   ??? Not on file       Social History     Tobacco Use   Smoking Status Current Every Day Smoker   ??? Packs/day: 1.00   ??? Years: 45.00   ??? Pack years: 45.00   Smokeless Tobacco Never Used   Tobacco Comment    stopped but restarted 6 mos ago       Allergies:  No Known Allergies    Vital Signs:  Vitals:    08/30/19 0831   BP: 136/74   Pulse: 70   Resp: 16   Temp: 98.2 ??F (36.8 ??C)   TempSrc: Temporal   SpO2: 97%   Weight: 100 lb 11.2 oz (45.7 kg)   Height: 5' 5.25" (1.657 m)     Body mass index is 16.63 kg/m??.    Lab/Imaging Results:  pending    Physical Exam:  Physical Exam  Vitals signs and nursing note reviewed.   Constitutional:       General: He is not in acute distress.  HENT:      Head: Normocephalic and atraumatic.   Neck:      Musculoskeletal: Normal range of motion and neck supple.      Thyroid: No thyromegaly.   Cardiovascular:      Rate and Rhythm: Normal rate and regular rhythm.   Pulmonary:      Effort: Pulmonary effort is normal.      Breath sounds: Normal breath sounds.   Skin:     General: Skin is warm and dry.   Neurological:  General: No focal deficit present.      Mental Status: He is alert and oriented to person, place, and time.      Cranial Nerves: No cranial nerve deficit.      Sensory: No sensory deficit.      Motor: No weakness.      Coordination: Coordination normal.      Gait: Gait normal.   Psychiatric:         Mood and Affect: Mood and affect normal.         Behavior: Behavior normal.         Thought Content: Thought content normal.         Cognition and Memory: Memory normal.         Judgment: Judgment normal.           Assessment and Plan:   Diagnoses and all orders for this visit:    1. Central loss of vision, bilateral  -     REFERRAL TO NEUROLOGY  -     MRA BRAIN W WO CONT; Future    2. Essential hypertension  -     amLODIPine (NORVASC) 5 mg tablet; Take 1 Tab by mouth daily.  -     rosuvastatin (CRESTOR) 10 mg tablet; Take 1 Tab by mouth nightly.    3. 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    4. Smoking  -     rosuvastatin (CRESTOR) 10 mg tablet; Take 1 Tab by mouth nightly.    5. Vision loss            Patient Instructions   A:  Odes Pint is here today for 3-week follow-up after being seen for loss of vision.  CT scan negative.  He saw opthalmology and they determined his visual disturbances were not coming from his eyes.    He reports he is still having the central visin loss about 1x per day.  Last 30-90 seconds.    He had normal lipid levels in July at [place].    Taking 81 mg aspirin a day.  Blood pressure controlled with amlodipine.  He is smoking about 1/2 pack per day.  Has cut down from 1 ppd.            PLAN:  1)  Quit smoking.  Continue all medications.  START cholesterol meds.  2)  We will schedule for MRI to get better look at brain.  3)  Referral to neurology.  4)  3 months medicare wellness visit.        [name], MD

## 2019-08-31 ENCOUNTER — Telehealth

## 2019-08-31 NOTE — Telephone Encounter (Signed)
Caleb Robinson @ [facility] scheduling called because pt is scheduled for MRA brain scan on 09/07/19 but she needs a new order that states MRA Brain without contrast sent over before appt.

## 2019-09-07 ENCOUNTER — Inpatient Hospital Stay: Admit: 2019-09-07 | Payer: MEDICARE | Attending: Family Medicine | Primary: Family Medicine

## 2019-09-07 DIAGNOSIS — H53413 Scotoma involving central area, bilateral: Secondary | ICD-10-CM

## 2019-09-07 NOTE — Progress Notes (Signed)
Pt informed of lab results. 

## 2019-09-07 NOTE — Progress Notes (Signed)
Left message to call back

## 2019-09-07 NOTE — Progress Notes (Signed)
Pt informed of lab results.

## 2019-09-10 NOTE — Progress Notes (Signed)
Left message to call back

## 2019-09-17 ENCOUNTER — Encounter

## 2019-09-17 MED ORDER — MIRTAZAPINE 15 MG TAB
15 mg | ORAL_TABLET | ORAL | 0 refills | Status: DC
Start: 2019-09-17 — End: 2019-11-23

## 2019-09-23 ENCOUNTER — Encounter: Payer: MEDICARE | Primary: Family Medicine

## 2019-10-26 ENCOUNTER — Ambulatory Visit: Attending: Neurology | Primary: Family Medicine

## 2019-10-26 ENCOUNTER — Ambulatory Visit: Admit: 2019-10-26 | Discharge: 2019-10-26 | Payer: MEDICARE | Attending: Neurology | Primary: Family Medicine

## 2019-10-26 DIAGNOSIS — H53413 Scotoma involving central area, bilateral: Secondary | ICD-10-CM

## 2019-10-26 NOTE — Progress Notes (Signed)
[facility]  [address], [address] [address]  Phone: [phone] Fax [phone]  Francene Boyers, MD      Patient: Caleb Robinson  Provider: Francene Boyers, MD    CC:   Chief Complaint   Patient presents with   ??? New Patient     Referring Provider: [name], MD    History of Present Illness:     Caleb Robinson is a 68 y.o. RH male who presents for evaluation of visual disturbances.     He is unaccompanied for today's visit.    He presents today for a history of visual disturbances. These have been present over the last several months. He describes episodes of very sudden onset in which he experiences a central area of bright light in his visual field. He is very clear that the disturbance is present even when he closes one eye. There are no obvious triggers and the episodes occur randomly. They last about 20 to 30 seconds before spontaneously resolving on their own. They are sporadic and irregular in frequency. There is no headache associated with the spells and there are no other focal neurologic symptoms.     He has had a couple of episodes where his vision is gone dark almost completely over a few seconds and these are quite distressing to him.Caleb Robinson     He is quite distressed over the symptoms because he worries starting to happen while he is driving or doing some other critical moment.    Work-up thus far includes CT head and MRA of the brain. He has seen ophthalmology but reports to me that no ocular pathology has been found. Other review of systems notes some area of pain along the left jaw.       Review of Systems:   Review of Systems   Constitutional: Negative for fever.   HENT: Negative for hearing loss.    Eyes: Positive for blurred vision.   Respiratory: Negative for cough.    Cardiovascular: Negative for chest pain.   Gastrointestinal: Negative for abdominal pain.   Genitourinary: Negative for dysuria.   Musculoskeletal: Negative for falls.   Skin: Negative for  rash.   Neurological: Negative for headaches.   Endo/Heme/Allergies: Does not bruise/bleed easily.   Psychiatric/Behavioral: Negative for depression.       Lab/Imaging Review:   I REVIEWED PERTINENT LABS, IMAGES, AND REPORTS WITH THE PATIENT PERSONALLY, DIRECTLY AND FULLY. THE MOST PERTINENT FINDINGS ARE NOTED BELOW:    MRA Brain September 2020:  IMPRESSION:   Negative MRA examination of the major intracranial arterial vasculature for occlusion, aneurysms, or evidence of significant stenosis.   Mild mucoperiosteal thickening in the right maxillary sinus with a small amount of layering fluid posteriorly as before which can be seen in the setting of acute right maxillary sinusitis. Correlate clinically.    CT Head August 2020:  IMPRESSION:    1.  No acute or worrisome findings suggested by noncontrast CT imaging of the head.   2. Small layering fluid within the partially visualized right maxillary sinus. This can be an indicator of right maxillary sinusitis if the patient has signs or symptoms of sinusitis.    Past Medical History:     Past medical history, surgical history, social history, family history, medications, and allergies were reviewed and updated as appropriate.     PAST MEDICAL HISTORY:  Past Medical History:   Diagnosis Date   ??? Chronic obstructive pulmonary disease ([place])    ???  Emphysema lung ([place])      PAST SURGICAL HISTORY:   Past Surgical History:   Procedure Laterality Date   ??? HX HERNIA REPAIR  05/2019     FAMILY HISTORY:  Family History   Problem Relation Age of Onset   ??? Dementia Mother       SOCIAL HISTORY:  Social History     Socioeconomic History   ??? Marital status: SINGLE     Spouse name: Not on file   ??? Number of children: Not on file   ??? Years of education: Not on file   ??? Highest education level: Not on file   Tobacco Use   ??? Smoking status: Current Every Day Smoker     Packs/day: 1.00     Years: 45.00     Pack years: 45.00   ??? Smokeless tobacco: Never Used   ??? Tobacco comment: stopped but  restarted 6 mos ago   Substance and Sexual Activity   ??? Alcohol use: Yes     Alcohol/week: 1.0 standard drinks     Types: 1 Cans of beer per week     Comment: occ   ??? Drug use: Never       Medications/Allergies:     MEDICATIONS:   Outpatient Encounter Medications as of 10/26/2019   Medication Sig Dispense Refill   ??? mirtazapine (REMERON) 15 mg tablet Take 1 tablet by mouth nightly 30 Tab 0   ??? amLODIPine (NORVASC) 5 mg tablet Take 1 Tab by mouth daily. 90 Tab 3   ??? rosuvastatin (CRESTOR) 10 mg tablet Take 1 Tab by mouth nightly. 90 Tab 3   ??? ibuprofen (MOTRIN) 800 mg tablet Take 1 Tab by mouth every eight (8) hours as needed for Pain. 60 Tab 3   ??? gabapentin (NEURONTIN) 100 mg capsule Take 2 Caps by mouth three (3) times daily as needed for Pain (Restless leg). 120 Cap 2   ??? aspirin delayed-release 81 mg tablet Take 1 Tab by mouth daily. 30 Tab 2   ??? mometasone-formoterol (DULERA) 200-5 mcg/actuation HFA inhaler Take 2 Puffs by inhalation two (2) times a day. 1 Inhaler 5   ??? albuterol (PROVENTIL VENTOLIN) 2.5 mg /3 mL (0.083 %) nebu 3 mL by Nebulization route every six (6) hours as needed for Wheezing. One premix vial every 6hrs prn. 30 Nebule 5   ??? albuterol (PROVENTIL HFA, VENTOLIN HFA, PROAIR HFA) 90 mcg/actuation inhaler Take 2 Puffs by inhalation every six (6) hours as needed for Wheezing. 1 Inhaler 6     No facility-administered encounter medications on file as of 10/26/2019.        ALLERGIES:  No Known Allergies      Physical Exam:     Visit Vitals  BP 137/76   Pulse 73   Ht 5' 4.5" (1.638 m)   Wt 99 lb (44.9 kg)   BMI 16.73 kg/m??     General Exam:  General: Well developed, well nourished, in no apparent distress.   HEENT: Normocephalic, atraumatic. Sclera anicteric. Oropharynx clear.   Neck: Supple without masses  Cardiovascular: Regular rate and rhythm. No carotid bruits.   Lungs: Non-labored breathing.   Abdomen: Soft, nontender, nondistended.   Extremities: Peripheral pulses intact. No edema and no  rashes.     Neurological Exam:     MS/Language/Speech:  Alert. Oriented x 3. Language fluent. Speech normal.     Cranial Nerves: PERRL. Eye movements full with normal pursuits. No nystagmus. Face was symmetric with good  activation and normal sensation. Tongue and palate were midline. Shoulder shrug symmetric.    Motor: Strength was full in all proximal and distal muscle groups. Tone was normal.     Abnormal Movements: There was no tremor or hyperkinetic movements.     Sensory: Normal to light touch throughout.    Cerebellar: No ataxia or dysmetria.      Reflexes (R/L): Biceps (2+/2+), Brachioradialis (2+/2+), Patellar (2+/2+), Ankle (2+/2+). Hoffman's was negative.      Gait: Can rise from a seated position without difficulty. Posture normal. Romberg was normal.  He walks with a casual gait.      Assessment and Plan:     Salvador Coupe is a 69 y.o. male who presents with the following issues:     There are no diagnoses linked to this encounter.    His neurological exam above is normal. I have discussed the symptoms with him in detail today. The etiology of this is not very clear. I would agree that it does not seem to be an ocular issue as it seems to persist even when he closes one eye. This would suggest at least some sort of CNS problem. Ocular or retinal migraines would be on the differential here-note there is no history of associated headache. Seizures would be less likely. I do not have a high suspicion for any ischemic etiology at this time.     A trial of a migraine prevention agent could be considered pending work-up    I have suggested that we obtain an MRI of the brain to rule out any other structural pathology.    Given the evidence of jaw pain in his age, it would be prudent to obtain an ESR and CRP to rule out any giant cell arteritis.       Follow-up to be arranged.      Signature: Jessica Priest, MD      Date:  10/26/2019    [facility]   [address], [address]  [place] [address]  Ph: [phone]  Fax: [phone]         I have personally interviewed and examined Mr. London Tarnowski and I have personally reviewed all relevant records including labs and imaging as noted above. I have written all aspects of this note. More than 50% of this time was used for counseling regarding my diagnosis, prognosis, and plans for management. Total visit time: 50 minutes.

## 2019-10-26 NOTE — Progress Notes (Signed)
Called and left message for pt informing him of his lab results.

## 2019-10-26 NOTE — Progress Notes (Signed)
Let patient know that lab test were normal.  We will await MRI of the brain.

## 2019-10-26 NOTE — Progress Notes (Signed)
[facility]  [address], [address] [address]  Phone: [phone] Fax [phone]  Francene Boyers, MD      Patient: Caleb Robinson  Provider: Francene Boyers, MD    CC:   Chief Complaint   Patient presents with   ??? New Patient     Referring Provider: [name], MD    History of Present Illness:     Caleb Robinson is a 68 y.o. RH male who presents for evaluation of visual disturbances.     He is unaccompanied for today's visit.    He presents today for a history of visual disturbances. These have been present over the last several months. He describes episodes of very sudden onset in which he experiences a central area of bright light in his visual field. He is very clear that the disturbance is present even when he closes one eye. There are no obvious triggers and the episodes occur randomly. They last about 20 to 30 seconds before spontaneously resolving on their own. They are sporadic and irregular in frequency. There is no headache associated with the spells and there are no other focal neurologic symptoms.     He has had a couple of episodes where his vision is gone dark almost completely over a few seconds and these are quite distressing to him.Marland Kitchen     He is quite distressed over the symptoms because he worries starting to happen while he is driving or doing some other critical moment.    Work-up thus far includes CT head and MRA of the brain. He has seen ophthalmology but reports to me that no ocular pathology has been found. Other review of systems notes some area of pain along the left jaw.       Review of Systems:   Review of Systems   Constitutional: Negative for fever.   HENT: Negative for hearing loss.    Eyes: Positive for blurred vision.   Respiratory: Negative for cough.    Cardiovascular: Negative for chest pain.   Gastrointestinal: Negative for abdominal pain.   Genitourinary: Negative for dysuria.   Musculoskeletal: Negative for falls.    Skin: Negative for rash.   Neurological: Negative for headaches.   Endo/Heme/Allergies: Does not bruise/bleed easily.   Psychiatric/Behavioral: Negative for depression.       Lab/Imaging Review:   I REVIEWED PERTINENT LABS, IMAGES, AND REPORTS WITH THE PATIENT PERSONALLY, DIRECTLY AND FULLY. THE MOST PERTINENT FINDINGS ARE NOTED BELOW:    MRA Brain September 2020:  IMPRESSION:   Negative MRA examination of the major intracranial arterial vasculature for occlusion, aneurysms, or evidence of significant stenosis.   Mild mucoperiosteal thickening in the right maxillary sinus with a small amount of layering fluid posteriorly as before which can be seen in the setting of acute right maxillary sinusitis. Correlate clinically.    CT Head August 2020:  IMPRESSION:    1.  No acute or worrisome findings suggested by noncontrast CT imaging of the head.   2. Small layering fluid within the partially visualized right maxillary sinus. This can be an indicator of right maxillary sinusitis if the patient has signs or symptoms of sinusitis.    Past Medical History:     Past medical history, surgical history, social history, family history, medications, and allergies were reviewed and updated as appropriate.     PAST MEDICAL HISTORY:  Past Medical History:   Diagnosis Date   ??? Chronic obstructive pulmonary disease ([place])    ???  Emphysema lung ([place])      PAST SURGICAL HISTORY:   Past Surgical History:   Procedure Laterality Date   ??? HX HERNIA REPAIR  05/2019     FAMILY HISTORY:  Family History   Problem Relation Age of Onset   ??? Dementia Mother       SOCIAL HISTORY:  Social History     Socioeconomic History   ??? Marital status: SINGLE     Spouse name: Not on file   ??? Number of children: Not on file   ??? Years of education: Not on file   ??? Highest education level: Not on file   Tobacco Use   ??? Smoking status: Current Every Day Smoker     Packs/day: 1.00     Years: 45.00     Pack years: 45.00   ??? Smokeless tobacco: Never Used    ??? Tobacco comment: stopped but restarted 6 mos ago   Substance and Sexual Activity   ??? Alcohol use: Yes     Alcohol/week: 1.0 standard drinks     Types: 1 Cans of beer per week     Comment: occ   ??? Drug use: Never       Medications/Allergies:     MEDICATIONS:   Outpatient Encounter Medications as of 10/26/2019   Medication Sig Dispense Refill   ??? mirtazapine (REMERON) 15 mg tablet Take 1 tablet by mouth nightly 30 Tab 0   ??? amLODIPine (NORVASC) 5 mg tablet Take 1 Tab by mouth daily. 90 Tab 3   ??? rosuvastatin (CRESTOR) 10 mg tablet Take 1 Tab by mouth nightly. 90 Tab 3   ??? ibuprofen (MOTRIN) 800 mg tablet Take 1 Tab by mouth every eight (8) hours as needed for Pain. 60 Tab 3   ??? gabapentin (NEURONTIN) 100 mg capsule Take 2 Caps by mouth three (3) times daily as needed for Pain (Restless leg). 120 Cap 2   ??? aspirin delayed-release 81 mg tablet Take 1 Tab by mouth daily. 30 Tab 2   ??? mometasone-formoterol (DULERA) 200-5 mcg/actuation HFA inhaler Take 2 Puffs by inhalation two (2) times a day. 1 Inhaler 5   ??? albuterol (PROVENTIL VENTOLIN) 2.5 mg /3 mL (0.083 %) nebu 3 mL by Nebulization route every six (6) hours as needed for Wheezing. One premix vial every 6hrs prn. 30 Nebule 5   ??? albuterol (PROVENTIL HFA, VENTOLIN HFA, PROAIR HFA) 90 mcg/actuation inhaler Take 2 Puffs by inhalation every six (6) hours as needed for Wheezing. 1 Inhaler 6     No facility-administered encounter medications on file as of 10/26/2019.        ALLERGIES:  No Known Allergies      Physical Exam:     Visit Vitals  BP 137/76   Pulse 73   Ht 5' 4.5" (1.638 m)   Wt 99 lb (44.9 kg)   BMI 16.73 kg/m??     General Exam:  General: Well developed, well nourished, in no apparent distress.   HEENT: Normocephalic, atraumatic. Sclera anicteric. Oropharynx clear.   Neck: Supple without masses  Cardiovascular: Regular rate and rhythm. No carotid bruits.   Lungs: Non-labored breathing.   Abdomen: Soft, nontender, nondistended.    Extremities: Peripheral pulses intact. No edema and no rashes.     Neurological Exam:     MS/Language/Speech:  Alert. Oriented x 3. Language fluent. Speech normal.     Cranial Nerves: PERRL. Eye movements full with normal pursuits. No nystagmus. Face was symmetric with good  activation and normal sensation. Tongue and palate were midline. Shoulder shrug symmetric.    Motor: Strength was full in all proximal and distal muscle groups. Tone was normal.     Abnormal Movements: There was no tremor or hyperkinetic movements.     Sensory: Normal to light touch throughout.    Cerebellar: No ataxia or dysmetria.      Reflexes (R/L): Biceps (2+/2+), Brachioradialis (2+/2+), Patellar (2+/2+), Ankle (2+/2+). Hoffman's was negative.      Gait: Can rise from a seated position without difficulty. Posture normal. Romberg was normal.  He walks with a casual gait.      Assessment and Plan:     Caleb Robinson is a 68 y.o. male who presents with the following issues:     There are no diagnoses linked to this encounter.    His neurological exam above is normal. I have discussed the symptoms with him in detail today. The etiology of this is not very clear. I would agree that it does not seem to be an ocular issue as it seems to persist even when he closes one eye. This would suggest at least some sort of CNS problem. Ocular or retinal migraines would be on the differential here-note there is no history of associated headache. Seizures would be less likely. I do not have a high suspicion for any ischemic etiology at this time.     A trial of a migraine prevention agent could be considered pending work-up    I have suggested that we obtain an MRI of the brain to rule out any other structural pathology.    Given the evidence of jaw pain in his age, it would be prudent to obtain an ESR and CRP to rule out any giant cell arteritis.       Follow-up to be arranged.      Signature: Jessica Priest, MD      Date:  10/26/2019     [facility]   [address]  [place] [address]  Ph: [phone]  Fax: [phone]         I have personally interviewed and examined [name] and I have personally reviewed all relevant records including labs and imaging as noted above. I have written all aspects of this note. More than 50% of this time was used for counseling regarding my diagnosis, prognosis, and plans for management. Total visit time: 50 minutes.

## 2019-10-26 NOTE — Patient Instructions (Signed)
Blood work today. You can get this done on the first floor (right off the elevator and left through the double doors to the adjacent building "[facility] first floor - [facility]).     We have ordered an MRI of your brain. Radiology will contact you to schedule this.

## 2019-10-27 LAB — C-REACTIVE PROTEIN: CRP: <1 mg/L (ref 0–10)

## 2019-10-27 LAB — SEDIMENTATION RATE: Sed Rate: 11 mm/hr (ref 0–30)

## 2019-10-27 LAB — C REACTIVE PROTEIN, QT: C-Reactive Protein, Qt: <1 mg/L (ref 0–10)

## 2019-10-27 LAB — SED RATE (ESR): Sed rate (ESR): 11 mm/h (ref 0–30)

## 2019-11-02 ENCOUNTER — Encounter: Attending: Family Medicine | Primary: Family Medicine

## 2019-11-09 ENCOUNTER — Inpatient Hospital Stay: Payer: MEDICARE | Attending: Neurology | Primary: Family Medicine

## 2019-11-23 ENCOUNTER — Ambulatory Visit: Attending: Family Medicine | Primary: Family Medicine

## 2019-11-23 ENCOUNTER — Ambulatory Visit: Admit: 2019-11-23 | Discharge: 2019-11-23 | Payer: MEDICARE | Attending: Family Medicine | Primary: Family Medicine

## 2019-11-23 DIAGNOSIS — Z0000 Encounter for general adult medical examination without abnormal findings: Secondary | ICD-10-CM

## 2019-11-23 MED ORDER — MIRTAZAPINE 15 MG TAB
15 mg | ORAL_TABLET | Freq: Every evening | ORAL | 1 refills | Status: DC
Start: 2019-11-23 — End: 2020-05-08

## 2019-11-23 MED ORDER — ASPIRIN 81 MG TAB, DELAYED RELEASE
81 mg | ORAL_TABLET | Freq: Every day | ORAL | 3 refills | Status: DC
Start: 2019-11-23 — End: 2020-11-22

## 2019-11-23 MED ORDER — IBUPROFEN 800 MG TAB
800 mg | ORAL_TABLET | Freq: Three times a day (TID) | ORAL | 3 refills | Status: DC | PRN
Start: 2019-11-23 — End: 2020-04-20

## 2019-11-23 MED ORDER — ALBUTEROL SULFATE 0.083 % (0.83 MG/ML) SOLN FOR INHALATION
2.5 mg /3 mL (0.083 %) | INHALATION_SOLUTION | Freq: Four times a day (QID) | RESPIRATORY_TRACT | 5 refills | Status: DC | PRN
Start: 2019-11-23 — End: 2021-02-14

## 2019-11-23 MED ORDER — GABAPENTIN 100 MG CAP
100 mg | ORAL_CAPSULE | Freq: Three times a day (TID) | ORAL | 3 refills | Status: DC | PRN
Start: 2019-11-23 — End: 2020-05-17

## 2019-11-23 NOTE — Progress Notes (Signed)
This is the Subsequent Medicare Annual Wellness Exam, performed 12 months or more after the Initial AWV or the last Subsequent AWV    I have reviewed the patient's medical history in detail and updated the computerized patient record.     Smoking still.  Rolls own cigarettes.  Trying to cut back.    Declines all vaccines at this time.        Depression Risk Factor Screening:     3 most recent PHQ Screens 11/23/2019   Little interest or pleasure in doing things Not at all   Feeling down, depressed, irritable, or hopeless Not at all   Total Score PHQ 2 0       Alcohol Risk Screen   Do you average more than 1 drink per night or more than 7 drinks a week: Yes    In the past three months have you have had more than 4 drinks containing alcohol on one occasion: Yes        Functional Ability and Level of Safety:   Hearing: Hearing is good.     Activities of Daily Living:  The home contains: no safety equipment.  Patient does total self care     Ambulation: with no difficulty     Fall Risk:  Fall Risk Assessment, last 12 mths 11/23/2019   Able to walk? Yes   Fall in past 12 months? No     Abuse Screen:  Patient is not abused       Cognitive Screening   Has your family/caregiver stated any concerns about your memory: no     Cognitive Screening: Normal - Mini Cog Test  Clock drawing WNL; Word recall 3/3  Assessment/Plan   Education and counseling provided:  Are appropriate based on today's review and evaluation    Diagnoses and all orders for this visit:    1. Medicare annual wellness visit, subsequent    2. Primary insomnia  -     mirtazapine (REMERON) 15 mg tablet; Take 1 Tab by mouth nightly.    3. Abnormal loss of weight  -     mirtazapine (REMERON) 15 mg tablet; Take 1 Tab by mouth nightly.    4. Chronic bilateral low back pain without sciatica  -     ibuprofen (MOTRIN) 800 mg tablet; Take 1 Tab by mouth every eight (8) hours as needed for Pain.    5. Pain in both lower extremities  -     gabapentin (NEURONTIN) 100 mg  capsule; Take 2 Caps by mouth three (3) times daily as needed for Pain (Restless leg).    6. Essential hypertension  -     aspirin delayed-release 81 mg tablet; Take 1 Tab by mouth daily.    7. Centrilobular emphysema (HCC)  -     albuterol (PROVENTIL VENTOLIN) 2.5 mg /3 mL (0.083 %) nebu; 3 mL by Nebulization route every six (6) hours as needed for Wheezing. One premix vial every 6hrs prn.    8. Encounter for immunization          Health Maintenance Due     Health Maintenance Due   Topic Date Due   ??? Hepatitis C Screening  1951-02-28   ??? DTaP/Tdap/Td series (1 - Tdap) 10/15/1972   ??? Shingrix Vaccine Age 80> (1 of 2) 10/15/2001   ??? Colorectal Cancer Screening Combo  10/15/2001   ??? GLAUCOMA SCREENING Q2Y  10/15/2016   ??? AAA Screening 36-68 YO Male Smoking Patients  10/15/2016   ???  Pneumococcal 65+ years (1 of 1 - PPSV23) 10/15/2016   ??? Medicare Yearly Exam  07/16/2019   ??? Flu Vaccine (1) 08/10/2019       Patient Care Team   Patient Care Team:  [name], MD as PCP - General (Family Medicine)  [name], MD as PCP - [facility] Empaneled Provider    History     Patient Active Problem List   Diagnosis Code   ??? Emphysema lung ([place]) J43.9   ??? Vision loss H54.7     Past Medical History:   Diagnosis Date   ??? Chronic obstructive pulmonary disease ([place])    ??? Emphysema lung [facility])       Past Surgical History:   Procedure Laterality Date   ??? HX HERNIA REPAIR  05/2019     Current Outpatient Medications   Medication Sig Dispense Refill   ??? mirtazapine (REMERON) 15 mg tablet Take 1 tablet by mouth nightly 30 Tab 0   ??? amLODIPine (NORVASC) 5 mg tablet Take 1 Tab by mouth daily. 90 Tab 3   ??? rosuvastatin (CRESTOR) 10 mg tablet Take 1 Tab by mouth nightly. 90 Tab 3   ??? ibuprofen (MOTRIN) 800 mg tablet Take 1 Tab by mouth every eight (8) hours as needed for Pain. 60 Tab 3   ??? gabapentin (NEURONTIN) 100 mg capsule Take 2 Caps by mouth three (3) times daily as needed for Pain (Restless leg). 120 Cap 2   ??? aspirin delayed-release 81 mg  tablet Take 1 Tab by mouth daily. 30 Tab 2   ??? albuterol (PROVENTIL VENTOLIN) 2.5 mg /3 mL (0.083 %) nebu 3 mL by Nebulization route every six (6) hours as needed for Wheezing. One premix vial every 6hrs prn. 30 Nebule 5   ??? albuterol (PROVENTIL HFA, VENTOLIN HFA, PROAIR HFA) 90 mcg/actuation inhaler Take 2 Puffs by inhalation every six (6) hours as needed for Wheezing. 1 Inhaler 6     No Known Allergies    Family History   Problem Relation Age of Onset   ??? Dementia Mother      Social History     Tobacco Use   ??? Smoking status: Current Every Day Smoker     Packs/day: 1.00     Years: 45.00     Pack years: 45.00   ??? Smokeless tobacco: Never Used   ??? Tobacco comment: stopped but restarted 6 mos ago   Substance Use Topics   ??? Alcohol use: Yes     Alcohol/week: 1.0 standard drinks     Types: 1 Cans of beer per week     Comment: occ

## 2019-11-23 NOTE — Patient Instructions (Addendum)
Medicare Wellness Visit, Male    The best way to live healthy is to have a lifestyle where you eat a well-balanced diet, exercise regularly, limit alcohol use, and quit all forms of tobacco/nicotine, if applicable.     Regular preventive services are another way to keep healthy. Preventive services (vaccines, screening tests, monitoring & exams) can help personalize your care plan, which helps you manage your own care. Screening tests can find health problems at the earliest stages, when they are easiest to treat.   [facility] follows the current, evidence-based guidelines published by the [place] [employer] (USPSTF) when recommending preventive services for our patients. Because we follow these guidelines, sometimes recommendations change over time as research supports it. (For example, a prostate screening blood test is no longer routinely recommended for men with no symptoms).  Of course, you and your doctor may decide to screen more often for some diseases, based on your risk and co-morbidities (chronic disease you are already diagnosed with).     Preventive services for you include:  - Medicare offers their members a free annual wellness visit, which is time for you and your primary care provider to discuss and plan for your preventive service needs. Take advantage of this benefit every year!  -All adults over age 84 should receive the recommended pneumonia vaccines. Current USPSTF guidelines recommend a series of two vaccines for the best pneumonia protection.   -All adults should have a flu vaccine yearly and tetanus vaccine every 10 years.  -All adults age 29 and older should receive the shingles vaccines (series of two vaccines).       -All adults age 64-70 who are overweight should have a diabetes screening test once every three years.   -Other screening tests & preventive services for persons with diabetes  include: an eye exam to screen for diabetic retinopathy, a kidney function test, a foot exam, and stricter control over your cholesterol.   -Cardiovascular screening for adults with routine risk involves an electrocardiogram (ECG) at intervals determined by the provider.   -Colorectal cancer screening should be done for adults age 91-75 with no increased risk factors for colorectal cancer.  There are a number of acceptable methods of screening for this type of cancer. Each test has its own benefits and drawbacks. Discuss with your provider what is most appropriate for you during your annual wellness visit. The different tests include: colonoscopy (considered the best screening method), a fecal occult blood test, a fecal DNA test, and sigmoidoscopy.  -All adults born between [place] should be screened once for Hepatitis C.  -An Abdominal Aortic Aneurysm (AAA) Screening is recommended for men age 60-75 who has ever smoked in their lifetime.     Here is a list of your current Health Maintenance items (your personalized list of preventive services) with a due date:  Health Maintenance Due   Topic Date Due   ??? Hepatitis C Test  30-Sep-1951   ??? DTaP/Tdap/Td  (1 - Tdap) 10/15/1972   ??? Shingles Vaccine (1 of 2) 10/15/2001   ??? Colorectal Screening  10/15/2001   ??? Glaucoma Screening   10/15/2016   ??? AAA Screening  10/15/2016   ??? Pneumococcal Vaccine (1 of 1 - PPSV23) 10/15/2016   ??? Annual Well Visit  07/16/2019   ??? Yearly Flu Vaccine (1) 08/10/2019

## 2019-11-23 NOTE — Progress Notes (Signed)
This is the Subsequent Medicare Annual Wellness Exam, performed 12 months or more after the Initial AWV or the last Subsequent AWV    I have reviewed the patient's medical history in detail and updated the computerized patient record.     Smoking still.  Rolls own cigarettes.  Trying to cut back.    Declines all vaccines at this time.        Depression Risk Factor Screening:     3 most recent PHQ Screens 11/23/2019   Little interest or pleasure in doing things Not at all   Feeling down, depressed, irritable, or hopeless Not at all   Total Score PHQ 2 0       Alcohol Risk Screen   Do you average more than 1 drink per night or more than 7 drinks a week: Yes    In the past three months have you have had more than 4 drinks containing alcohol on one occasion: Yes        Functional Ability and Level of Safety:   Hearing: Hearing is good.     Activities of Daily Living:  The home contains: no safety equipment.  Patient does total self care     Ambulation: with no difficulty     Fall Risk:  Fall Risk Assessment, last 12 mths 11/23/2019   Able to walk? Yes   Fall in past 12 months? No     Abuse Screen:  Patient is not abused       Cognitive Screening   Has your family/caregiver stated any concerns about your memory: no     Cognitive Screening: Normal - Mini Cog Test  Clock drawing WNL; Word recall 3/3  Assessment/Plan   Education and counseling provided:  Are appropriate based on today's review and evaluation    Diagnoses and all orders for this visit:    1. Medicare annual wellness visit, subsequent    2. Primary insomnia  -     mirtazapine (REMERON) 15 mg tablet; Take 1 Tab by mouth nightly.    3. Abnormal loss of weight  -     mirtazapine (REMERON) 15 mg tablet; Take 1 Tab by mouth nightly.    4. Chronic bilateral low back pain without sciatica  -     ibuprofen (MOTRIN) 800 mg tablet; Take 1 Tab by mouth every eight (8) hours as needed for Pain.    5. Pain in both lower extremities   -     gabapentin (NEURONTIN) 100 mg capsule; Take 2 Caps by mouth three (3) times daily as needed for Pain (Restless leg).    6. Essential hypertension  -     aspirin delayed-release 81 mg tablet; Take 1 Tab by mouth daily.    7. Centrilobular emphysema (HCC)  -     albuterol (PROVENTIL VENTOLIN) 2.5 mg /3 mL (0.083 %) nebu; 3 mL by Nebulization route every six (6) hours as needed for Wheezing. One premix vial every 6hrs prn.    8. Encounter for immunization          Health Maintenance Due     Health Maintenance Due   Topic Date Due   ??? Hepatitis C Screening  06/11/1951   ??? DTaP/Tdap/Td series (1 - Tdap) 10/15/1972   ??? Shingrix Vaccine Age 15> (1 of 2) 10/15/2001   ??? Colorectal Cancer Screening Combo  10/15/2001   ??? GLAUCOMA SCREENING Q2Y  10/15/2016   ??? AAA Screening 40-68 YO Male Smoking Patients  10/15/2016   ???  Pneumococcal 65+ years (1 of 1 - PPSV23) 10/15/2016   ??? Medicare Yearly Exam  07/16/2019   ??? Flu Vaccine (1) 08/10/2019       Patient Care Team   Patient Care Team:  [name], MD as PCP - General (Family Medicine)  [name], MD as PCP - [facility] Empaneled Provider    History     Patient Active Problem List   Diagnosis Code   ??? Emphysema lung ([place]) J43.9   ??? Vision loss H54.7     Past Medical History:   Diagnosis Date   ??? Chronic obstructive pulmonary disease ([place])    ??? Emphysema lung [facility])       Past Surgical History:   Procedure Laterality Date   ??? HX HERNIA REPAIR  05/2019     Current Outpatient Medications   Medication Sig Dispense Refill   ??? mirtazapine (REMERON) 15 mg tablet Take 1 tablet by mouth nightly 30 Tab 0   ??? amLODIPine (NORVASC) 5 mg tablet Take 1 Tab by mouth daily. 90 Tab 3   ??? rosuvastatin (CRESTOR) 10 mg tablet Take 1 Tab by mouth nightly. 90 Tab 3   ??? ibuprofen (MOTRIN) 800 mg tablet Take 1 Tab by mouth every eight (8) hours as needed for Pain. 60 Tab 3   ??? gabapentin (NEURONTIN) 100 mg capsule Take 2 Caps by mouth three (3) times daily as needed for Pain (Restless leg). 120 Cap 2    ??? aspirin delayed-release 81 mg tablet Take 1 Tab by mouth daily. 30 Tab 2   ??? albuterol (PROVENTIL VENTOLIN) 2.5 mg /3 mL (0.083 %) nebu 3 mL by Nebulization route every six (6) hours as needed for Wheezing. One premix vial every 6hrs prn. 30 Nebule 5   ??? albuterol (PROVENTIL HFA, VENTOLIN HFA, PROAIR HFA) 90 mcg/actuation inhaler Take 2 Puffs by inhalation every six (6) hours as needed for Wheezing. 1 Inhaler 6     No Known Allergies    Family History   Problem Relation Age of Onset   ??? Dementia Mother      Social History     Tobacco Use   ??? Smoking status: Current Every Day Smoker     Packs/day: 1.00     Years: 45.00     Pack years: 45.00   ??? Smokeless tobacco: Never Used   ??? Tobacco comment: stopped but restarted 6 mos ago   Substance Use Topics   ??? Alcohol use: Yes     Alcohol/week: 1.0 standard drinks     Types: 1 Cans of beer per week     Comment: occ

## 2020-04-20 ENCOUNTER — Encounter

## 2020-04-20 MED ORDER — IBUPROFEN 800 MG TAB
800 mg | ORAL_TABLET | ORAL | 0 refills | Status: DC
Start: 2020-04-20 — End: 2020-05-17

## 2020-04-28 ENCOUNTER — Ambulatory Visit: Attending: Medical | Primary: Family Medicine

## 2020-04-28 ENCOUNTER — Ambulatory Visit: Admit: 2020-04-28 | Discharge: 2020-04-28 | Payer: MEDICARE | Attending: Medical | Primary: Family Medicine

## 2020-04-28 DIAGNOSIS — R221 Localized swelling, mass and lump, neck: Secondary | ICD-10-CM

## 2020-04-28 NOTE — Progress Notes (Signed)
[facility] [place] [address]  Phone [phone]      Patient: Caleb Robinson  Date of Birth: 01/07/1951  Age 69 y.o.  Sex male  MEDICAL RECORD NUMBER [id number]  Visit Date: 04/28/20  Author:  Sueanne Margarita, PA-C    Family Practice Clinic Note    Chief Complaint   Patient presents with   ??? Mouth Swelling       History of Present Illness  This is a 69 year old gentleman who presents today with complaints of soreness and swelling under his tongue which is been ongoing now for approximately 3 to 4 months.  He states that he has had some discomfort in this area as well.  It is made it difficult for him to put his dentures in.  He notes pain with chewing.  Discomfort under the tongue seems to be worse on the left side.  Also notes that in the last 3 to 4 days a large area of swelling is appeared on the right side of his neck below the jawline.  He has some mild tenderness associated with this as well.  He is a long time active smoker.  Denies fever or chills.  Denies neck pain, throat pain or difficulty swallowing.  He has a history of COPD and denies any change in his baseline breathing.    Past History:    Past Medical history   Past Medical History:   Diagnosis Date   ??? Chronic obstructive pulmonary disease ([place])     follows with Pulmonology   ??? Emphysema lung ([place])        Current Problem List:   Patient Active Problem List   Diagnosis Code   ??? Emphysema lung (HCC) J43.9   ??? Vision loss H54.7       Current Medications:   Current Outpatient Medications   Medication Sig Dispense   ??? ibuprofen (MOTRIN) 800 mg tablet TAKE 1 TABLET BY MOUTH EVERY 8 HOURS AS NEEDED FOR PAIN 60 Tab   ??? mirtazapine (REMERON) 15 mg tablet Take 1 Tab by mouth nightly. 90 Tab   ??? gabapentin (NEURONTIN) 100 mg capsule Take 2 Caps by mouth three (3) times daily as needed for Pain (Restless leg). 360 Cap   ??? aspirin delayed-release 81 mg tablet Take 1 Tab by mouth daily. 90 Tab   ??? albuterol (PROVENTIL  VENTOLIN) 2.5 mg /3 mL (0.083 %) nebu 3 mL by Nebulization route every six (6) hours as needed for Wheezing. One premix vial every 6hrs prn. 30 Nebule   ??? amLODIPine (NORVASC) 5 mg tablet Take 1 Tab by mouth daily. 90 Tab   ??? rosuvastatin (CRESTOR) 10 mg tablet Take 1 Tab by mouth nightly. 90 Tab   ??? albuterol (PROVENTIL HFA, VENTOLIN HFA, PROAIR HFA) 90 mcg/actuation inhaler Take 2 Puffs by inhalation every six (6) hours as needed for Wheezing. 1 Inhaler     No current facility-administered medications for this visit.       Allergies:No Known Allergies    Surgical History:  Past Surgical History:   Procedure Laterality Date   ??? HX HERNIA REPAIR  05/2019       Family History:  Family History   Problem Relation Age of Onset   ??? Dementia Mother        Social History:   Social History     Social History Narrative   ??? Not on file      Social History  Socioeconomic History   ??? Marital status: SINGLE     Spouse name: Not on file   ??? Number of children: Not on file   ??? Years of education: Not on file   ??? Highest education level: Not on file   Occupational History   ??? Not on file   Tobacco Use   ??? Smoking status: Current Every Day Smoker     Packs/day: 1.00     Years: 45.00     Pack years: 45.00   ??? Smokeless tobacco: Never Used   ??? Tobacco comment: stopped but restarted Dec 2019   Substance and Sexual Activity   ??? Alcohol use: Yes     Alcohol/week: 1.0 standard drinks     Types: 1 Cans of beer per week     Comment: occ   ??? Drug use: Never   ??? Sexual activity: Not on file     Comment: widow   Other Topics Concern   ??? Not on file   Social History Narrative   ??? Not on file     Social Determinants of Health     Financial Resource Strain:    ??? Difficulty of Paying Living Expenses:    Food Insecurity:    ??? Worried About Charity fundraiser in the Last Year:    ??? Arboriculturist in the Last Year:    Transportation Needs:    ??? Film/video editor (Medical):    ??? Lack of Transportation (Non-Medical):    Physical Activity:     ??? Days of Exercise per Week:    ??? Minutes of Exercise per Session:    Stress:    ??? Feeling of Stress :    Social Connections:    ??? Frequency of Communication with Friends and Family:    ??? Frequency of Social Gatherings with Friends and Family:    ??? Attends Religious Services:    ??? Marine scientist or Organizations:    ??? Attends Music therapist:    ??? Marital Status:    Intimate Production manager Violence:    ??? Fear of Current or Ex-Partner:    ??? Emotionally Abused:    ??? Physically Abused:    ??? Sexually Abused:          ROS  Review of Systems   Constitutional: Negative for chills and fever.   HENT: Negative for congestion, ear pain, facial swelling, mouth sores, sore throat and trouble swallowing.    Respiratory: Negative for cough and shortness of breath.    Cardiovascular: Negative for chest pain.         Visit Vitals  BP 138/68 (BP 1 Location: Right arm, BP Patient Position: Sitting, BP Cuff Size: Adult)   Pulse 64   Temp 97.5 ??F (36.4 ??C) (Temporal)   Resp 16   Ht 5\' 5"  (1.651 m)   Wt 105 lb 11.2 oz (47.9 kg)   SpO2 94%   BMI 17.59 kg/m??     Body mass index is 17.59 kg/m??.     Physical Exam    Physical Exam  Vitals and nursing note reviewed.   Constitutional:       General: He is not in acute distress.     Appearance: He is not ill-appearing.   HENT:      Head: Normocephalic.      Right Ear: External ear normal.      Left Ear: External ear normal.      Nose:  Nose normal.      Mouth/Throat:      Tongue: No lesions.      Pharynx: No oropharyngeal exudate or posterior oropharyngeal erythema.      Comments: Edentulous.  Patient does not have dentures in place currently.  There does appear to be a growth under his tongue at midline.  No other tissue abnormalities noted.  Eyes:      Conjunctiva/sclera: Conjunctivae normal.   Neck:      Comments: Mass noted in the right upper neck/submandibular region.  Mildly tender to palpation.  Smaller appearing mass in a similar position on the left side of neck as  well.  Cardiovascular:      Rate and Rhythm: Normal rate and regular rhythm.      Heart sounds: Normal heart sounds.   Pulmonary:      Effort: Pulmonary effort is normal.      Breath sounds: Normal breath sounds.   Musculoskeletal:      Cervical back: Neck supple.   Lymphadenopathy:      Cervical: No cervical adenopathy.      Upper Body:      Right upper body: No supraclavicular, axillary or epitrochlear adenopathy.      Left upper body: No supraclavicular, axillary or epitrochlear adenopathy.   Neurological:      Mental Status: He is alert.   Psychiatric:         Mood and Affect: Mood normal.         Behavior: Behavior normal.         Thought Content: Thought content normal.         ASSESSMENT & PLAN  Encounter Diagnoses     ICD-10-CM ICD-9-CM   1. Neck mass  R22.1 784.2   2. Mouth swelling  R22.0 784.2   3. Tobacco use disorder  F17.200 305.1       1. Neck mass  *Given patient's smoking history rapid onset of current symptoms/exam findings, will arrange a CT scan of the head and neck for evaluation.  *We will also arrange referral to ENT for evaluation.  - CT HEAD NECK W CONT; Future  - REFERRAL TO ENT-OTOLARYNGOLOGY    2. Mouth swelling  *As above  *May use Tylenol as needed for discomfort for now.  - CT HEAD NECK W CONT; Future  - REFERRAL TO ENT-OTOLARYNGOLOGY    3. Tobacco use disorder  *Mended smoking cessation.  - CT HEAD NECK W CONT; Future  - REFERRAL TO ENT-OTOLARYNGOLOGY    4.  Other  *Patient was seen and examined by the attending (Dr. Judeth Porch) who was also involved in the formulation of the assessment and plan.      Orders Placed This Encounter   ??? CT HEAD NECK W CONT     Standing Status:   Future     Standing Expiration Date:   05/29/2021     Order Specific Question:   STAT Creatinine as indicated     Answer:   Yes   ??? ENT/OTOLARYNGOLOGY - [facility]     Referral Priority:   Urgent     Referral Type:   Consultation     Referral Reason:   Specialty Services Required     Requested Specialty:    Otolaryngology     Number of Visits Requested:   1     I have reviewed the patient's past medical history, social history and family history and vitals.  We have discussed treatment plan and follow up  and given patient instructions.  Patient's questions are answered and we will follow up as indicated.  .    Dictated using voice recognition software.  Proof read but unrecognized errors may exist.    Follow-up and Dispositions    ?? Return in about 1 week (around 05/05/2020) for 1 week f/u neck mass, mouth swelling, CT.         Sueanne Margarita, PA-C

## 2020-04-28 NOTE — Progress Notes (Signed)
[facility] [address]  Phone [phone]      Patient: Caleb Robinson  Date of Birth: July 27, 1951  Age 69 y.o.  Sex male  MEDICAL RECORD NUMBER [id number]  Visit Date: 04/28/20  Author:  Sueanne Margarita, PA-C    Family Practice Clinic Note    Chief Complaint   Patient presents with   ??? Mouth Swelling       History of Present Illness  This is a 69 year old gentleman who presents today with complaints of soreness and swelling under his tongue which is been ongoing now for approximately 3 to 4 months.  He states that he has had some discomfort in this area as well.  It is made it difficult for him to put his dentures in.  He notes pain with chewing.  Discomfort under the tongue seems to be worse on the left side.  Also notes that in the last 3 to 4 days a large area of swelling is appeared on the right side of his neck below the jawline.  He has some mild tenderness associated with this as well.  He is a long time active smoker.  Denies fever or chills.  Denies neck pain, throat pain or difficulty swallowing.  He has a history of COPD and denies any change in his baseline breathing.    Past History:    Past Medical history   Past Medical History:   Diagnosis Date   ??? Chronic obstructive pulmonary disease ([place])     follows with Pulmonology   ??? Emphysema lung ([place])        Current Problem List:   Patient Active Problem List   Diagnosis Code   ??? Emphysema lung (HCC) J43.9   ??? Vision loss H54.7       Current Medications:   Current Outpatient Medications   Medication Sig Dispense   ??? ibuprofen (MOTRIN) 800 mg tablet TAKE 1 TABLET BY MOUTH EVERY 8 HOURS AS NEEDED FOR PAIN 60 Tab   ??? mirtazapine (REMERON) 15 mg tablet Take 1 Tab by mouth nightly. 90 Tab   ??? gabapentin (NEURONTIN) 100 mg capsule Take 2 Caps by mouth three (3) times daily as needed for Pain (Restless leg). 360 Cap   ??? aspirin delayed-release 81 mg tablet Take 1 Tab by mouth daily. 90 Tab   ??? albuterol (PROVENTIL  VENTOLIN) 2.5 mg /3 mL (0.083 %) nebu 3 mL by Nebulization route every six (6) hours as needed for Wheezing. One premix vial every 6hrs prn. 30 Nebule   ??? amLODIPine (NORVASC) 5 mg tablet Take 1 Tab by mouth daily. 90 Tab   ??? rosuvastatin (CRESTOR) 10 mg tablet Take 1 Tab by mouth nightly. 90 Tab   ??? albuterol (PROVENTIL HFA, VENTOLIN HFA, PROAIR HFA) 90 mcg/actuation inhaler Take 2 Puffs by inhalation every six (6) hours as needed for Wheezing. 1 Inhaler     No current facility-administered medications for this visit.       Allergies:No Known Allergies    Surgical History:  Past Surgical History:   Procedure Laterality Date   ??? HX HERNIA REPAIR  05/2019       Family History:  Family History   Problem Relation Age of Onset   ??? Dementia Mother        Social History:   Social History     Social History Narrative   ??? Not on file      Social History  Socioeconomic History   ??? Marital status: SINGLE     Spouse name: Not on file   ??? Number of children: Not on file   ??? Years of education: Not on file   ??? Highest education level: Not on file   Occupational History   ??? Not on file   Tobacco Use   ??? Smoking status: Current Every Day Smoker     Packs/day: 1.00     Years: 45.00     Pack years: 45.00   ??? Smokeless tobacco: Never Used   ??? Tobacco comment: stopped but restarted Dec 2019   Substance and Sexual Activity   ??? Alcohol use: Yes     Alcohol/week: 1.0 standard drinks     Types: 1 Cans of beer per week     Comment: occ   ??? Drug use: Never   ??? Sexual activity: Not on file     Comment: widow   Other Topics Concern   ??? Not on file   Social History Narrative   ??? Not on file     Social Determinants of Health     Financial Resource Strain:    ??? Difficulty of Paying Living Expenses:    Food Insecurity:    ??? Worried About Charity fundraiser in the Last Year:    ??? Arboriculturist in the Last Year:    Transportation Needs:    ??? Film/video editor (Medical):    ??? Lack of Transportation (Non-Medical):    Physical Activity:     ??? Days of Exercise per Week:    ??? Minutes of Exercise per Session:    Stress:    ??? Feeling of Stress :    Social Connections:    ??? Frequency of Communication with Friends and Family:    ??? Frequency of Social Gatherings with Friends and Family:    ??? Attends Religious Services:    ??? Marine scientist or Organizations:    ??? Attends Music therapist:    ??? Marital Status:    Intimate Production manager Violence:    ??? Fear of Current or Ex-Partner:    ??? Emotionally Abused:    ??? Physically Abused:    ??? Sexually Abused:          ROS  Review of Systems   Constitutional: Negative for chills and fever.   HENT: Negative for congestion, ear pain, facial swelling, mouth sores, sore throat and trouble swallowing.    Respiratory: Negative for cough and shortness of breath.    Cardiovascular: Negative for chest pain.         Visit Vitals  BP 138/68 (BP 1 Location: Right arm, BP Patient Position: Sitting, BP Cuff Size: Adult)   Pulse 64   Temp 97.5 ??F (36.4 ??C) (Temporal)   Resp 16   Ht 5\' 5"  (1.651 m)   Wt 105 lb 11.2 oz (47.9 kg)   SpO2 94%   BMI 17.59 kg/m??     Body mass index is 17.59 kg/m??.     Physical Exam    Physical Exam  Vitals and nursing note reviewed.   Constitutional:       General: He is not in acute distress.     Appearance: He is not ill-appearing.   HENT:      Head: Normocephalic.      Right Ear: External ear normal.      Left Ear: External ear normal.      Nose:  Nose normal.      Mouth/Throat:      Tongue: No lesions.      Pharynx: No oropharyngeal exudate or posterior oropharyngeal erythema.      Comments: Edentulous.  Patient does not have dentures in place currently.  There does appear to be a growth under his tongue at midline.  No other tissue abnormalities noted.  Eyes:      Conjunctiva/sclera: Conjunctivae normal.   Neck:      Comments: Mass noted in the right upper neck/submandibular region.  Mildly tender to palpation.  Smaller appearing mass in a similar position on the left side of neck as well.   Cardiovascular:      Rate and Rhythm: Normal rate and regular rhythm.      Heart sounds: Normal heart sounds.   Pulmonary:      Effort: Pulmonary effort is normal.      Breath sounds: Normal breath sounds.   Musculoskeletal:      Cervical back: Neck supple.   Lymphadenopathy:      Cervical: No cervical adenopathy.      Upper Body:      Right upper body: No supraclavicular, axillary or epitrochlear adenopathy.      Left upper body: No supraclavicular, axillary or epitrochlear adenopathy.   Neurological:      Mental Status: He is alert.   Psychiatric:         Mood and Affect: Mood normal.         Behavior: Behavior normal.         Thought Content: Thought content normal.         ASSESSMENT & PLAN  Encounter Diagnoses     ICD-10-CM ICD-9-CM   1. Neck mass  R22.1 784.2   2. Mouth swelling  R22.0 784.2   3. Tobacco use disorder  F17.200 305.1       1. Neck mass  *Given patient's smoking history rapid onset of current symptoms/exam findings, will arrange a CT scan of the head and neck for evaluation.  *We will also arrange referral to ENT for evaluation.  - CT HEAD NECK W CONT; Future  - REFERRAL TO ENT-OTOLARYNGOLOGY    2. Mouth swelling  *As above  *May use Tylenol as needed for discomfort for now.  - CT HEAD NECK W CONT; Future  - REFERRAL TO ENT-OTOLARYNGOLOGY    3. Tobacco use disorder  *Mended smoking cessation.  - CT HEAD NECK W CONT; Future  - REFERRAL TO ENT-OTOLARYNGOLOGY    4.  Other  *Patient was seen and examined by the attending (Dr. Judeth Porch) who was also involved in the formulation of the assessment and plan.      Orders Placed This Encounter   ??? CT HEAD NECK W CONT     Standing Status:   Future     Standing Expiration Date:   05/29/2021     Order Specific Question:   STAT Creatinine as indicated     Answer:   Yes   ??? ENT/OTOLARYNGOLOGY - [facility]     Referral Priority:   Urgent     Referral Type:   Consultation     Referral Reason:   Specialty Services Required     Requested Specialty:   Otolaryngology      Number of Visits Requested:   1     I have reviewed the patient's past medical history, social history and family history and vitals.  We have discussed treatment plan and follow up  and given patient instructions.  Patient's questions are answered and we will follow up as indicated.  .    Dictated using voice recognition software.  Proof read but unrecognized errors may exist.    Follow-up and Dispositions    ?? Return in about 1 week (around 05/05/2020) for 1 week f/u neck mass, mouth swelling, CT.         Sueanne Margarita, PA-C

## 2020-05-03 ENCOUNTER — Inpatient Hospital Stay: Admit: 2020-05-03 | Payer: MEDICARE | Attending: Medical | Primary: Family Medicine

## 2020-05-03 ENCOUNTER — Inpatient Hospital Stay: Payer: MEDICARE | Attending: Medical | Primary: Family Medicine

## 2020-05-03 DIAGNOSIS — R221 Localized swelling, mass and lump, neck: Secondary | ICD-10-CM

## 2020-05-03 MED ORDER — SODIUM CHLORIDE 0.9% BOLUS IV
0.9 % | Freq: Once | INTRAVENOUS | Status: AC
Start: 2020-05-03 — End: 2020-05-03
  Administered 2020-05-03: 13:00:00 via INTRAVENOUS

## 2020-05-03 MED ORDER — SALINE PERIPHERAL FLUSH PRN
Freq: Once | INTRAMUSCULAR | Status: AC
Start: 2020-05-03 — End: 2020-05-03

## 2020-05-03 MED ORDER — IOPAMIDOL 76 % IV SOLN
76 % | Freq: Once | INTRAVENOUS | Status: AC
Start: 2020-05-03 — End: 2020-05-03
  Administered 2020-05-03: 13:00:00 via INTRAVENOUS

## 2020-05-03 NOTE — Progress Notes (Signed)
Results reviewed with pt at visit

## 2020-05-04 ENCOUNTER — Ambulatory Visit: Payer: MEDICARE | Primary: Family Medicine

## 2020-05-05 ENCOUNTER — Ambulatory Visit: Attending: Medical | Primary: Family Medicine

## 2020-05-05 ENCOUNTER — Ambulatory Visit: Admit: 2020-05-05 | Discharge: 2020-05-05 | Payer: MEDICARE | Attending: Medical | Primary: Family Medicine

## 2020-05-05 DIAGNOSIS — R221 Localized swelling, mass and lump, neck: Secondary | ICD-10-CM

## 2020-05-05 MED ORDER — AMOXICILLIN CLAVULANATE 875 MG-125 MG TAB
875-125 mg | ORAL_TABLET | Freq: Two times a day (BID) | ORAL | 0 refills | Status: DC
Start: 2020-05-05 — End: 2020-06-27

## 2020-05-05 NOTE — Progress Notes (Signed)
[facility] [place] [address]  Phone [phone]      Patient: Caleb Robinson  Date of Birth: Nov 25, 1951  Age 69 y.o.  Sex male  MEDICAL RECORD NUMBER [id number]  Visit Date: 05/05/20  Author:  Sueanne Margarita, PA-C    Family Practice Clinic Note    Chief Complaint   Patient presents with   ??? Results     1 week f/u CT results, neck mass       History of Present Illness  This is a 69 year old gentleman who returns today for 1 week follow-up.  To review his recent CT scan of the neck and recheck neck mass.  He denies any significant change in the size or of his symptoms.  He does note that he has been having more discomfort of the left side of his mouth/jaw area since last visit.  States that he has pain which is fairly constant but does worsen when he bites down.  Is difficult for him to place and remove his dentures as a result of this.  He denies any fever or chills.    ============================================================================  Hx 04/28/20 (DF)  ASSESSMENT & PLAN    1. Neck mass  *Given patient's smoking history rapid onset of current symptoms/exam findings, will arrange a CT scan of the head and neck for evaluation.  *We will also arrange referral to ENT for evaluation.  - CT HEAD NECK W CONT; Future  - REFERRAL TO ENT-OTOLARYNGOLOGY  ??  2. Mouth swelling  *As above  *May use Tylenol as needed for discomfort for now.  - CT HEAD NECK W CONT; Future  - REFERRAL TO ENT-OTOLARYNGOLOGY  ??  3. Tobacco use disorder  *Mended smoking cessation.  - CT HEAD NECK W CONT; Future  - REFERRAL TO ENT-OTOLARYNGOLOGY  ??  4.  Other  *Patient was seen and examined by the attending (Dr. Judeth Porch) who was also involved in the formulation of the assessment and plan.  ??  Follow-up and Dispositions    ?? Return in about 1 week (around 05/05/2020) for 1 week f/u neck mass, mouth swelling, CT.  ??     ===========================================================================    Past  History:    Past Medical history   Past Medical History:   Diagnosis Date   ??? Chronic obstructive pulmonary disease ([place])     follows with Pulmonology   ??? Emphysema lung ([place])        Current Problem List:   Patient Active Problem List   Diagnosis Code   ??? Emphysema lung (HCC) J43.9   ??? Vision loss H54.7       Current Medications:   Current Outpatient Medications   Medication Sig Dispense   ??? amoxicillin-clavulanate (AUGMENTIN) 875-125 mg per tablet Take 1 Tablet by mouth every twelve (12) hours. 20 Tablet   ??? ibuprofen (MOTRIN) 800 mg tablet TAKE 1 TABLET BY MOUTH EVERY 8 HOURS AS NEEDED FOR PAIN 60 Tab   ??? mirtazapine (REMERON) 15 mg tablet Take 1 Tab by mouth nightly. 90 Tab   ??? gabapentin (NEURONTIN) 100 mg capsule Take 2 Caps by mouth three (3) times daily as needed for Pain (Restless leg). 360 Cap   ??? aspirin delayed-release 81 mg tablet Take 1 Tab by mouth daily. 90 Tab   ??? albuterol (PROVENTIL VENTOLIN) 2.5 mg /3 mL (0.083 %) nebu 3 mL by Nebulization route every six (6) hours as needed for Wheezing. One premix vial  every 6hrs prn. 30 Nebule   ??? amLODIPine (NORVASC) 5 mg tablet Take 1 Tab by mouth daily. 90 Tab   ??? rosuvastatin (CRESTOR) 10 mg tablet Take 1 Tab by mouth nightly. 90 Tab   ??? albuterol (PROVENTIL HFA, VENTOLIN HFA, PROAIR HFA) 90 mcg/actuation inhaler Take 2 Puffs by inhalation every six (6) hours as needed for Wheezing. 1 Inhaler     No current facility-administered medications for this visit.       Allergies:No Known Allergies    Surgical History:  Past Surgical History:   Procedure Laterality Date   ??? HX HERNIA REPAIR  05/2019       Family History:  Family History   Problem Relation Age of Onset   ??? Dementia Mother        Social History:   Social History     Social History Narrative   ??? Not on file      Social History     Socioeconomic History   ??? Marital status: SINGLE     Spouse name: Not on file   ??? Number of children: Not on file   ??? Years of education: Not on file   ??? Highest education  level: Not on file   Occupational History   ??? Not on file   Tobacco Use   ??? Smoking status: Current Every Day Smoker     Packs/day: 1.00     Years: 45.00     Pack years: 45.00   ??? Smokeless tobacco: Never Used   ??? Tobacco comment: stopped but restarted Dec 2019   Substance and Sexual Activity   ??? Alcohol use: Yes     Alcohol/week: 1.0 standard drinks     Types: 1 Cans of beer per week     Comment: occ   ??? Drug use: Never   ??? Sexual activity: Not on file     Comment: widow   Other Topics Concern   ??? Not on file   Social History Narrative   ??? Not on file     Social Determinants of Health     Financial Resource Strain:    ??? Difficulty of Paying Living Expenses:    Food Insecurity:    ??? Worried About Charity fundraiser in the Last Year:    ??? Arboriculturist in the Last Year:    Transportation Needs:    ??? Film/video editor (Medical):    ??? Lack of Transportation (Non-Medical):    Physical Activity:    ??? Days of Exercise per Week:    ??? Minutes of Exercise per Session:    Stress:    ??? Feeling of Stress :    Social Connections:    ??? Frequency of Communication with Friends and Family:    ??? Frequency of Social Gatherings with Friends and Family:    ??? Attends Religious Services:    ??? Marine scientist or Organizations:    ??? Attends Music therapist:    ??? Marital Status:    Intimate Production manager Violence:    ??? Fear of Current or Ex-Partner:    ??? Emotionally Abused:    ??? Physically Abused:    ??? Sexually Abused:          ROS  Review of Systems   Constitutional: Negative for chills and fever.   HENT: Negative for congestion, ear pain, facial swelling, mouth sores, sore throat and trouble swallowing.    Respiratory: Negative for cough  and shortness of breath.    Cardiovascular: Negative for chest pain.         Visit Vitals  BP 128/62 (BP 1 Location: Right upper arm, BP Patient Position: Sitting, BP Cuff Size: Adult)   Pulse 60   Temp 97.3 ??F (36.3 ??C) (Temporal)   Resp 16   Wt 105 lb (47.6 kg) Comment: with shoes    BMI 17.47 kg/m??     Body mass index is 17.47 kg/m??.     Physical Exam    Physical Exam  Vitals and nursing note reviewed.   Constitutional:       General: He is not in acute distress.     Appearance: He is not ill-appearing.   HENT:      Head: Normocephalic.      Right Ear: External ear normal.      Left Ear: External ear normal.      Nose: Nose normal.      Mouth/Throat:      Tongue: No lesions.      Pharynx: No oropharyngeal exudate or posterior oropharyngeal erythema.      Comments: Pt wearing lower denture - unable to remove  Eyes:      Conjunctiva/sclera: Conjunctivae normal.   Neck:      Comments: Mass noted in the right upper neck/submandibular region.  Mildly tender to palpation.  Smaller appearing mass in a similar position on the left side of neck as well.  Cardiovascular:      Rate and Rhythm: Normal rate and regular rhythm.      Heart sounds: Normal heart sounds.   Pulmonary:      Effort: Pulmonary effort is normal.      Breath sounds: Normal breath sounds.   Musculoskeletal:      Cervical back: Neck supple.   Lymphadenopathy:      Cervical: No cervical adenopathy.      Upper Body:      Right upper body: No supraclavicular, axillary or epitrochlear adenopathy.      Left upper body: No supraclavicular, axillary or epitrochlear adenopathy.   Neurological:      Mental Status: He is alert.   Psychiatric:         Mood and Affect: Mood normal.         Behavior: Behavior normal.         Thought Content: Thought content normal.          05/03/2020 08:40 05/03/2020 ??9:04 AM [id number] ??9:04 AM   Study Result    Narrative & Impression   CT neck with contrast   ??  History: Neck mass, swelling under tongue bilaterally. Tobacco use  ??  Technique: Helically acquired images were obtained from above the orbits to the  thoracic inlet reconstructed at 3.0 mm thickness after the uneventful  administration of 80 mL of intravenous Optiray-350 in order to better evaluate  the soft tissue and vascular structures. Coronal  reformatted images were  submitted.  ??  Radiation dose reduction techniques were used for this study:  Our CT scanners  use one or all of the following: Automated exposure control, adjustment of the  mA and/or kVp according to patient's size, iterative reconstruction.  ??  Comparison: None  ??  Findings: There is minor mucosal thickening within the right maxillary sinus.  The mastoid air cells are clear.. The area palpable concern was localized with a  cutaneous marker overlying the right submandibular gland. 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  There is mild asymmetric dilatation of the submandibular duct throughout the  entirety. The thyroid gland is homogeneous. There is no mucosal lesion. The  parotid glands are unremarkable.   ??  There is a 1.5 cm level II lymph node on the right with a small low density  component. There is mild cervical spondylosis.. The major vascular structures  are unremarkable.  ??  Moderate emphysematous changes are present within the lung apices. There is mild  biapical scarring.  ??  IMPRESSION  1. Bilateral submandibular sialectasis, right greater than left. The area  palpable concern overlies the right ventricular gland. The main submandibular  duct also is mildly dilated without a definite mass identified. ENT consultation  is recommended.  2. Mildly enlarged indeterminate lymph node within level II on the right. A  metastatic lymph node cannot be excluded on the basis of this study and could be  related to the findings findings above.       ASSESSMENT & PLAN  Encounter Diagnoses     ICD-10-CM ICD-9-CM   1. Neck mass  R22.1 784.2   2. Mouth swelling  R22.0 784.2       1. Neck mass  *CT of the neck results reviewed and discussed with patient.  Appears to have involvement of the submandibular glands bilaterally.  No definite mass identified.  *Referral had already been placed to ENT and is scheduled to follow-up with them in  approximately 2 weeks.  He voiced understanding.    2. Mouth swelling  *We will have him use Augmentin twice daily for the next 10 days.  *Suggest over-the-counter ibuprofen or Tylenol as needed for discomfort.    - amoxicillin-clavulanate (AUGMENTIN) 875-125 mg per tablet; Take 1 Tablet by mouth every twelve (12) hours.  Dispense: 20 Tablet; Refill: 0      Orders Placed This Encounter   ??? amoxicillin-clavulanate (AUGMENTIN) 875-125 mg per tablet     Sig: Take 1 Tablet by mouth every twelve (12) hours.     Dispense:  20 Tablet     Refill:  0     I have reviewed the patient's past medical history, social history and family history and vitals.  We have discussed treatment plan and follow up and given patient instructions.  Patient's questions are answered and we will follow up as indicated.      Dictated using voice recognition software.  Proof read but unrecognized errors may exist.    Follow-up and Dispositions    ?? Return as previously scheduled.         Sueanne Margarita, PA-C

## 2020-05-05 NOTE — Patient Instructions (Signed)
*  Take the antibiotic (Augmentin) twice daily for the next 10 days.  *See the ENT as scheduled 05/23/20.  *Call/return for any worsening symptoms sooner.

## 2020-05-05 NOTE — Progress Notes (Signed)
Results reviewed with pt at visit

## 2020-05-05 NOTE — Progress Notes (Signed)
[facility] [place] [address]  Phone [phone]      Patient: Caleb Robinson  Date of Birth: Sep 04, 1951  Age 69 y.o.  Sex male  MEDICAL RECORD NUMBER [id number]  Visit Date: 05/05/20  Author:  Sueanne Margarita, PA-C    Family Practice Clinic Note    Chief Complaint   Patient presents with   ??? Results     1 week f/u CT results, neck mass       History of Present Illness  This is a 69 year old gentleman who returns today for 1 week follow-up.  To review his recent CT scan of the neck and recheck neck mass.  He denies any significant change in the size or of his symptoms.  He does note that he has been having more discomfort of the left side of his mouth/jaw area since last visit.  States that he has pain which is fairly constant but does worsen when he bites down.  Is difficult for him to place and remove his dentures as a result of this.  He denies any fever or chills.    ============================================================================  Hx 04/28/20 (DF)  ASSESSMENT & PLAN    1. Neck mass  *Given patient's smoking history rapid onset of current symptoms/exam findings, will arrange a CT scan of the head and neck for evaluation.  *We will also arrange referral to ENT for evaluation.  - CT HEAD NECK W CONT; Future  - REFERRAL TO ENT-OTOLARYNGOLOGY  ??  2. Mouth swelling  *As above  *May use Tylenol as needed for discomfort for now.  - CT HEAD NECK W CONT; Future  - REFERRAL TO ENT-OTOLARYNGOLOGY  ??  3. Tobacco use disorder  *Mended smoking cessation.  - CT HEAD NECK W CONT; Future  - REFERRAL TO ENT-OTOLARYNGOLOGY  ??  4.  Other  *Patient was seen and examined by the attending (Dr. Judeth Porch) who was also involved in the formulation of the assessment and plan.  ??  Follow-up and Dispositions    ?? Return in about 1 week (around 05/05/2020) for 1 week f/u neck mass, mouth swelling, CT.  ??     ===========================================================================    Past  History:    Past Medical history   Past Medical History:   Diagnosis Date   ??? Chronic obstructive pulmonary disease ([place])     follows with Pulmonology   ??? Emphysema lung ([place])        Current Problem List:   Patient Active Problem List   Diagnosis Code   ??? Emphysema lung (HCC) J43.9   ??? Vision loss H54.7       Current Medications:   Current Outpatient Medications   Medication Sig Dispense   ??? amoxicillin-clavulanate (AUGMENTIN) 875-125 mg per tablet Take 1 Tablet by mouth every twelve (12) hours. 20 Tablet   ??? ibuprofen (MOTRIN) 800 mg tablet TAKE 1 TABLET BY MOUTH EVERY 8 HOURS AS NEEDED FOR PAIN 60 Tab   ??? mirtazapine (REMERON) 15 mg tablet Take 1 Tab by mouth nightly. 90 Tab   ??? gabapentin (NEURONTIN) 100 mg capsule Take 2 Caps by mouth three (3) times daily as needed for Pain (Restless leg). 360 Cap   ??? aspirin delayed-release 81 mg tablet Take 1 Tab by mouth daily. 90 Tab   ??? albuterol (PROVENTIL VENTOLIN) 2.5 mg /3 mL (0.083 %) nebu 3 mL by Nebulization route every six (6) hours as needed for Wheezing. One premix vial  every 6hrs prn. 30 Nebule   ??? amLODIPine (NORVASC) 5 mg tablet Take 1 Tab by mouth daily. 90 Tab   ??? rosuvastatin (CRESTOR) 10 mg tablet Take 1 Tab by mouth nightly. 90 Tab   ??? albuterol (PROVENTIL HFA, VENTOLIN HFA, PROAIR HFA) 90 mcg/actuation inhaler Take 2 Puffs by inhalation every six (6) hours as needed for Wheezing. 1 Inhaler     No current facility-administered medications for this visit.       Allergies:No Known Allergies    Surgical History:  Past Surgical History:   Procedure Laterality Date   ??? HX HERNIA REPAIR  05/2019       Family History:  Family History   Problem Relation Age of Onset   ??? Dementia Mother        Social History:   Social History     Social History Narrative   ??? Not on file      Social History     Socioeconomic History   ??? Marital status: SINGLE     Spouse name: Not on file   ??? Number of children: Not on file   ??? Years of education: Not on file   ??? Highest education  level: Not on file   Occupational History   ??? Not on file   Tobacco Use   ??? Smoking status: Current Every Day Smoker     Packs/day: 1.00     Years: 45.00     Pack years: 45.00   ??? Smokeless tobacco: Never Used   ??? Tobacco comment: stopped but restarted Dec 2019   Substance and Sexual Activity   ??? Alcohol use: Yes     Alcohol/week: 1.0 standard drinks     Types: 1 Cans of beer per week     Comment: occ   ??? Drug use: Never   ??? Sexual activity: Not on file     Comment: widow   Other Topics Concern   ??? Not on file   Social History Narrative   ??? Not on file     Social Determinants of Health     Financial Resource Strain:    ??? Difficulty of Paying Living Expenses:    Food Insecurity:    ??? Worried About Charity fundraiser in the Last Year:    ??? Arboriculturist in the Last Year:    Transportation Needs:    ??? Film/video editor (Medical):    ??? Lack of Transportation (Non-Medical):    Physical Activity:    ??? Days of Exercise per Week:    ??? Minutes of Exercise per Session:    Stress:    ??? Feeling of Stress :    Social Connections:    ??? Frequency of Communication with Friends and Family:    ??? Frequency of Social Gatherings with Friends and Family:    ??? Attends Religious Services:    ??? Marine scientist or Organizations:    ??? Attends Music therapist:    ??? Marital Status:    Intimate Production manager Violence:    ??? Fear of Current or Ex-Partner:    ??? Emotionally Abused:    ??? Physically Abused:    ??? Sexually Abused:          ROS  Review of Systems   Constitutional: Negative for chills and fever.   HENT: Negative for congestion, ear pain, facial swelling, mouth sores, sore throat and trouble swallowing.    Respiratory: Negative for cough  and shortness of breath.    Cardiovascular: Negative for chest pain.         Visit Vitals  BP 128/62 (BP 1 Location: Right upper arm, BP Patient Position: Sitting, BP Cuff Size: Adult)   Pulse 60   Temp 97.3 ??F (36.3 ??C) (Temporal)   Resp 16   Wt 105 lb (47.6 kg) Comment: with shoes    BMI 17.47 kg/m??     Body mass index is 17.47 kg/m??.     Physical Exam    Physical Exam  Vitals and nursing note reviewed.   Constitutional:       General: He is not in acute distress.     Appearance: He is not ill-appearing.   HENT:      Head: Normocephalic.      Right Ear: External ear normal.      Left Ear: External ear normal.      Nose: Nose normal.      Mouth/Throat:      Tongue: No lesions.      Pharynx: No oropharyngeal exudate or posterior oropharyngeal erythema.      Comments: Pt wearing lower denture - unable to remove  Eyes:      Conjunctiva/sclera: Conjunctivae normal.   Neck:      Comments: Mass noted in the right upper neck/submandibular region.  Mildly tender to palpation.  Smaller appearing mass in a similar position on the left side of neck as well.  Cardiovascular:      Rate and Rhythm: Normal rate and regular rhythm.      Heart sounds: Normal heart sounds.   Pulmonary:      Effort: Pulmonary effort is normal.      Breath sounds: Normal breath sounds.   Musculoskeletal:      Cervical back: Neck supple.   Lymphadenopathy:      Cervical: No cervical adenopathy.      Upper Body:      Right upper body: No supraclavicular, axillary or epitrochlear adenopathy.      Left upper body: No supraclavicular, axillary or epitrochlear adenopathy.   Neurological:      Mental Status: He is alert.   Psychiatric:         Mood and Affect: Mood normal.         Behavior: Behavior normal.         Thought Content: Thought content normal.          05/03/2020 08:40 05/03/2020 ??9:04 AM [id number] ??9:04 AM   Study Result    Narrative & Impression   CT neck with contrast   ??  History: Neck mass, swelling under tongue bilaterally. Tobacco use  ??  Technique: Helically acquired images were obtained from above the orbits to the  thoracic inlet reconstructed at 3.0 mm thickness after the uneventful  administration of 80 mL of intravenous Optiray-350 in order to better evaluate  the soft tissue and vascular structures. Coronal  reformatted images were  submitted.  ??  Radiation dose reduction techniques were used for this study:  Our CT scanners  use one or all of the following: Automated exposure control, adjustment of the  mA and/or kVp according to patient's size, iterative reconstruction.  ??  Comparison: None  ??  Findings: There is minor mucosal thickening within the right maxillary sinus.  The mastoid air cells are clear.. The area palpable concern was localized with a  cutaneous marker overlying the right submandibular gland. 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  There is mild asymmetric dilatation of the submandibular duct throughout the  entirety. The thyroid gland is homogeneous. There is no mucosal lesion. The  parotid glands are unremarkable.   ??  There is a 1.5 cm level II lymph node on the right with a small low density  component. There is mild cervical spondylosis.. The major vascular structures  are unremarkable.  ??  Moderate emphysematous changes are present within the lung apices. There is mild  biapical scarring.  ??  IMPRESSION  1. Bilateral submandibular sialectasis, right greater than left. The area  palpable concern overlies the right ventricular gland. The main submandibular  duct also is mildly dilated without a definite mass identified. ENT consultation  is recommended.  2. Mildly enlarged indeterminate lymph node within level II on the right. A  metastatic lymph node cannot be excluded on the basis of this study and could be  related to the findings findings above.       ASSESSMENT & PLAN  Encounter Diagnoses     ICD-10-CM ICD-9-CM   1. Neck mass  R22.1 784.2   2. Mouth swelling  R22.0 784.2       1. Neck mass  *CT of the neck results reviewed and discussed with patient.  Appears to have involvement of the submandibular glands bilaterally.  No definite mass identified.  *Referral had already been placed to ENT and is scheduled to follow-up with them in  approximately 2 weeks.  He voiced understanding.    2. Mouth swelling  *We will have him use Augmentin twice daily for the next 10 days.  *Suggest over-the-counter ibuprofen or Tylenol as needed for discomfort.    - amoxicillin-clavulanate (AUGMENTIN) 875-125 mg per tablet; Take 1 Tablet by mouth every twelve (12) hours.  Dispense: 20 Tablet; Refill: 0      Orders Placed This Encounter   ??? amoxicillin-clavulanate (AUGMENTIN) 875-125 mg per tablet     Sig: Take 1 Tablet by mouth every twelve (12) hours.     Dispense:  20 Tablet     Refill:  0     I have reviewed the patient's past medical history, social history and family history and vitals.  We have discussed treatment plan and follow up and given patient instructions.  Patient's questions are answered and we will follow up as indicated.      Dictated using voice recognition software.  Proof read but unrecognized errors may exist.    Follow-up and Dispositions    ?? Return as previously scheduled.         Sueanne Margarita, PA-C

## 2020-05-08 ENCOUNTER — Encounter

## 2020-05-08 MED ORDER — MIRTAZAPINE 15 MG TAB
15 mg | ORAL_TABLET | ORAL | 0 refills | Status: DC
Start: 2020-05-08 — End: 2020-09-05

## 2020-05-17 ENCOUNTER — Ambulatory Visit: Attending: Family Medicine | Primary: Family Medicine

## 2020-05-17 ENCOUNTER — Ambulatory Visit: Admit: 2020-05-17 | Discharge: 2020-05-17 | Payer: MEDICARE | Attending: Family Medicine | Primary: Family Medicine

## 2020-05-17 ENCOUNTER — Encounter

## 2020-05-17 DIAGNOSIS — R0600 Dyspnea, unspecified: Secondary | ICD-10-CM

## 2020-05-17 MED ORDER — GABAPENTIN 100 MG CAP
100 mg | ORAL_CAPSULE | Freq: Three times a day (TID) | ORAL | 3 refills | Status: DC | PRN
Start: 2020-05-17 — End: 2021-02-06

## 2020-05-17 MED ORDER — AMLODIPINE 5 MG TAB
5 mg | ORAL_TABLET | Freq: Every day | ORAL | 3 refills | Status: DC
Start: 2020-05-17 — End: 2020-11-22

## 2020-05-17 MED ORDER — ROSUVASTATIN 10 MG TAB
10 mg | ORAL_TABLET | Freq: Every evening | ORAL | 3 refills | Status: DC
Start: 2020-05-17 — End: 2020-09-05

## 2020-05-17 MED ORDER — IBUPROFEN 800 MG TAB
800 mg | ORAL_TABLET | Freq: Two times a day (BID) | ORAL | 2 refills | Status: DC | PRN
Start: 2020-05-17 — End: 2020-08-18

## 2020-05-17 MED ORDER — ALBUTEROL SULFATE HFA 90 MCG/ACTUATION AEROSOL INHALER
90 mcg/actuation | Freq: Four times a day (QID) | RESPIRATORY_TRACT | 6 refills | Status: DC | PRN
Start: 2020-05-17 — End: 2021-02-14

## 2020-05-17 NOTE — Progress Notes (Signed)
Caleb Robinson (DOB: 1951/11/07) is a 69 y.o. male, established patient, here for evaluation of the following chief complaint(s):  Follow-up (6 month )       ASSESSMENT/PLAN:  Below is the assessment and plan developed based on review of pertinent history, physical exam, labs, studies, and medications.    1. Essential hypertension  -     rosuvastatin (CRESTOR) 10 mg tablet; Take 1 Tablet by mouth nightly., Normal, Disp-90 Tablet, R-3  -     amLODIPine (NORVASC) 5 mg tablet; Take 1 Tablet by mouth daily., Normal, Disp-90 Tablet, R-3  2. 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 Smoking  -     rosuvastatin (CRESTOR) 10 mg tablet; Take 1 Tablet by mouth nightly., Normal, Disp-90 Tablet, R-3  4. Chronic bilateral low back pain without sciatica  -     ibuprofen (MOTRIN) 800 mg tablet; Normal, Disp-60 Tablet, R-0  5. Pain in both lower extremities  -     gabapentin (NEURONTIN) 100 mg capsule; Take 2 Capsules by mouth three (3) times daily as needed for Pain (Restless leg)., Normal, Disp-360 Capsule, R-3  6. Centrilobular emphysema (HCC)  -     albuterol (PROVENTIL HFA, VENTOLIN HFA, PROAIR HFA) 90 mcg/actuation inhaler; Take 2 Puffs by inhalation every six (6) hours as needed for Wheezing., Normal, Disp-1 Inhaler, R-6    PLAN:  Appointment with ENT next week.  We will order CT scan due to worsening shortness of breath.  Med refills sent in.  Try to continue to cut back on smoking.  Follow up with [place] in December for medicare wellness.        SUBJECTIVE/OBJECTIVE:  69 year old male with underlying COPD, heavy tobacco use, hypertension.  He is waiting to see ENT for a previously diagnosed right neck mass.  Has an appointment next week.      Here for recheck of blood pressure and COPD.  He is using inhaler several times per week.  Taking trelegy once every morning.  Despite this he reports  worsening shortness of breath; DOE.  Has never seen pulmonology before.  Concern for worsening COPD versus malignancy due to neck mass and longstanding tobacco use.    Gabapentin is helping with night leg cramps.    Taking amlodipine for blood pressure.  He denies chest pain, headaches.            Review of Systems   Constitutional: Positive for fatigue. Negative for activity change, appetite change, chills, diaphoresis and fever.   HENT: Positive for facial swelling.    Respiratory: Positive for cough, shortness of breath and wheezing. Negative for apnea, choking and chest tightness.    Cardiovascular: Negative.    Gastrointestinal: Negative.    Genitourinary: Negative.    Musculoskeletal: Negative.    Neurological: Negative for headaches.       Physical Exam  Vitals and nursing note reviewed.   Constitutional:       General: He is not in acute distress.     Appearance: He is not ill-appearing or toxic-appearing.   Cardiovascular:      Rate and Rhythm: Normal rate and regular rhythm.   Pulmonary:      Effort: Pulmonary effort is normal. No respiratory distress.      Breath sounds: Wheezing and rhonchi present.   Chest:      Chest wall:  No tenderness.   Neurological:      General: No focal deficit present.      Mental Status: He is oriented to person, place, and time.           On this date 05/17/2020 I have spent 30 minutes reviewing previous notes, test results and face to face with the patient discussing the diagnosis and importance of compliance with the treatment plan as well as documenting on the day of the visit.    An electronic signature was used to authenticate this note.  -- [name], MD

## 2020-05-23 ENCOUNTER — Ambulatory Visit: Attending: Otolaryngology | Primary: Family Medicine

## 2020-05-23 ENCOUNTER — Ambulatory Visit: Admit: 2020-05-23 | Discharge: 2020-05-23 | Payer: MEDICARE | Attending: Otolaryngology | Primary: Family Medicine

## 2020-05-23 DIAGNOSIS — K1379 Other lesions of oral mucosa: Secondary | ICD-10-CM

## 2020-05-23 NOTE — Progress Notes (Signed)
HPI:  Caleb Robinson is a 69 y.o. male seen as a new patient for New Patient (Referred by Sueanne Margarita, PA-C. Patient presents today with c/o neck mass and oral swelling x 3 weeks . Mass is painful when touch and is bothers him when speaking for long periods of time. ).     Patient presents as a new patient referral evaluation visit with complaint of mass oral cavity swelling.  He has a history of smoking for greater than 45 years.  He has had 3 to 4 weeks of worsening right-sided tender neck mass.  He is also had an associated area of tenderness and swelling under his tongue that he attributes to his dentures and has been worsening for the last couple months.  He states that his tongue has become somewhat restricted to motion over the last few weeks and has had significant increase in tenderness to the area during that time.  He denies any oral cavity bleeding.     Past Medical History, Past Surgical History, Family history, Social History, and Medications were all reviewed with the patient today and updated as necessary.     No Known Allergies    Patient Active Problem List   Diagnosis Code   ??? Emphysema lung (HCC) J43.9   ??? Vision loss H54.7       Current Outpatient Medications   Medication Sig   ??? clindamycin (CLEOCIN) 300 mg capsule Take 1 Capsule by mouth three (3) times daily for 10 days.   ??? methylPREDNISolone (Medrol, Pak,) 4 mg tablet Take 1 Tablet by mouth Specific Days and Specific Times.   ??? Trelegy Ellipta 100-62.5-25 mcg inhaler Take 2 Puffs by inhalation daily.   ??? rosuvastatin (CRESTOR) 10 mg tablet Take 1 Tablet by mouth nightly.   ??? ibuprofen (MOTRIN) 800 mg tablet Take 1 Tablet by mouth two (2) times daily as needed for Pain.   ??? gabapentin (NEURONTIN) 100 mg capsule Take 2 Capsules by mouth three (3) times daily as needed for Pain (Restless leg).   ??? amLODIPine (NORVASC) 5 mg tablet Take 1 Tablet by mouth daily.   ??? albuterol (PROVENTIL HFA, VENTOLIN HFA, PROAIR HFA) 90  mcg/actuation inhaler Take 2 Puffs by inhalation every six (6) hours as needed for Wheezing.   ??? mirtazapine (REMERON) 15 mg tablet Take 1 tablet by mouth nightly   ??? amoxicillin-clavulanate (AUGMENTIN) 875-125 mg per tablet Take 1 Tablet by mouth every twelve (12) hours.   ??? aspirin delayed-release 81 mg tablet Take 1 Tab by mouth daily.   ??? albuterol (PROVENTIL VENTOLIN) 2.5 mg /3 mL (0.083 %) nebu 3 mL by Nebulization route every six (6) hours as needed for Wheezing. One premix vial every 6hrs prn.     No current facility-administered medications for this visit.       Past Medical History:   Diagnosis Date   ??? Chronic obstructive pulmonary disease ([place])     follows with Pulmonology   ??? Emphysema lung [facility])        Past Surgical History:   Procedure Laterality Date   ??? HX HERNIA REPAIR  05/2019       Social History     Tobacco Use   ??? Smoking status: Current Every Day Smoker     Packs/day: 0.75     Years: 45.00     Pack years: 33.75   ??? Smokeless tobacco: Never Used   ??? Tobacco comment: stopped but restarted Dec 2019   Substance Use Topics   ???  Alcohol use: Yes     Alcohol/week: 1.0 standard drinks     Types: 1 Cans of beer per week     Comment: occ       Family History   Problem Relation Age of Onset   ??? Dementia Mother         ROS:    Review of Systems   Constitutional: Negative for chills and fever.   HENT: Negative for hearing loss.    Eyes: Negative for blurred vision.   Respiratory: Negative for cough.    Cardiovascular: Negative for chest pain.   Gastrointestinal: Negative for heartburn.   Genitourinary: Negative for dysuria.   Musculoskeletal: Negative for myalgias.   Skin: Negative for rash.   Neurological: Negative for dizziness.   Psychiatric/Behavioral: Negative for depression.          PHYSICAL EXAM:    Visit Vitals  Resp 16   Ht 5\' 5"  (1.651 m)   Wt 103 lb (46.7 kg)   BMI 17.14 kg/m??       Physical Exam  Vitals and nursing note reviewed.   Constitutional:       General: He is awake. He is not in acute  distress.     Appearance: Normal appearance. He is well-developed and normal weight. He is not ill-appearing or diaphoretic.   HENT:      Head: Normocephalic and atraumatic.      Jaw: No trismus, tenderness, swelling or pain on movement.      Salivary Glands: Right salivary gland is not diffusely enlarged or tender. Left salivary gland is not diffusely enlarged or tender.      Right Ear: Tympanic membrane, ear canal and external ear normal. No drainage, swelling or tenderness. No middle ear effusion. There is no impacted cerumen. Tympanic membrane is not perforated.      Left Ear: Tympanic membrane, ear canal and external ear normal. No drainage, swelling or tenderness.  No middle ear effusion. There is no impacted cerumen. Tympanic membrane is not perforated.      Nose: Nose normal. No nasal deformity, nasal tenderness, mucosal edema, congestion or rhinorrhea.      Right Nostril: No epistaxis, septal hematoma or occlusion.      Left Nostril: No epistaxis, septal hematoma or occlusion.      Mouth/Throat:      Lips: No lesions.      Mouth: Mucous membranes are moist. No injury or oral lesions.      Tongue: No lesions. Tongue does not deviate from midline.      Palate: No mass and lesions.      Pharynx: Oropharynx is clear. Uvula midline. No pharyngeal swelling, oropharyngeal exudate, posterior oropharyngeal erythema or uvula swelling.      Tonsils: No tonsillar exudate or tonsillar abscesses.      Comments: Patient is edentulous.  Use of dentures.  Midline oral cavity floor of mouth/ventral tongue mucosal lesion with raised red borders and central necrosis.  Exquisite tenderness to palpation.  No bleeding on palpation.  Significant granuloma type soft tissue to the gingival sulcus of the mandible.  Multiple areas of leukoplakia present.  Eyes:      General: No scleral icterus.     Extraocular Movements: Extraocular movements intact.      Conjunctiva/sclera: Conjunctivae normal.      Pupils: Pupils are equal, round,  and reactive to light.   Neck:      Thyroid: No thyroid mass or thyromegaly.      Trachea:  Trachea and phonation normal. No tracheal tenderness.      Comments: Large palpable right submandibular gland with significant tenderness to palpation.  No overlying skin changes.  No obvious palpable cervical lymphadenopathy otherwise.  Pulmonary:      Effort: Pulmonary effort is normal. No tachypnea.      Breath sounds: No stridor.   Musculoskeletal:      Cervical back: Normal range of motion and neck supple. No edema, erythema or rigidity.   Lymphadenopathy:      Head:      Right side of head: No submental, submandibular, preauricular or posterior auricular adenopathy.      Left side of head: No submental, submandibular, preauricular or posterior auricular adenopathy.      Cervical: No cervical adenopathy.      Right cervical: No superficial, deep or posterior cervical adenopathy.     Left cervical: No superficial, deep or posterior cervical adenopathy.   Skin:     General: Skin is warm and dry.   Neurological:      Mental Status: He is alert and oriented to person, place, and time.      Cranial Nerves: Cranial nerves are intact. No facial asymmetry.   Psychiatric:         Mood and Affect: Mood and affect normal.         Behavior: Behavior normal.            ASSESSMENT and PLAN        ICD-10-CM ICD-9-CM    1. Mass of floor of mouth  K13.79 528.9    2. Tongue mass  K14.8 529.8    3. Sialoadenitis of submandibular gland  K11.20 527.2      Patient has significant floor of mouth lesion.  This is at the midline and adherent to the ventral surface of the tongue.  There is restriction to mobility of the tongue which has become relatively fixed with limited left and right mobility.  Discussed with patient that there is a high suspicion of an underlying malignancy to the lesion and recommendations have been made for biopsy.  He is not able to tolerate biopsy in the office and therefore I have recommended moderate sedation versus  general anesthesia for biopsy of oral cavity lesion.  We discussed that his right sided enlarged submandibular gland is likely a post obstructive sialoadenitis due to the floor mouth lesion present.  I discussed all the risks of biopsy including pain, bleeding, infection, inadequate sampling with need of further sampling as well as potential need for further procedures and they would like to proceed.    Patient is deferring flexible laryngoscopy at today's visit.  We will plan for additional direct laryngoscopy with possible biopsies during his procedure.  I discussed all the risks of laryngoscopy including bleeding, pharyngeal perforation, hoarseness, damage to teeth/lips/gums, and need for further procedures and he would like to proceed.      Lonzo Cloud, DO  05/23/2020

## 2020-05-24 ENCOUNTER — Encounter

## 2020-05-24 MED ORDER — CLINDAMYCIN 300 MG CAP
300 mg | ORAL_CAPSULE | Freq: Three times a day (TID) | ORAL | 0 refills | Status: AC
Start: 2020-05-24 — End: 2020-06-02

## 2020-05-24 MED ORDER — METHYLPREDNISOLONE 4 MG TABS IN A DOSE PACK
4 mg | ORAL | 0 refills | Status: DC
Start: 2020-05-24 — End: 2020-06-27

## 2020-05-25 NOTE — Telephone Encounter (Signed)
-----   Message from Lonzo Cloud, DO sent at 05/23/2020 11:43 PM EDT -----  Regarding: call to inform of medications  Hi, I have sent an antibiotic and steroid to this patient's pharmacy to be used to help treat his submandibular gland infection.  Can you also have him use biotin oral rinse twice daily and increase his oral hydration.    Thank you

## 2020-05-26 ENCOUNTER — Encounter

## 2020-05-26 NOTE — Interval H&P Note (Signed)
Requested medical records from [facility] at [phone].

## 2020-05-26 NOTE — Interval H&P Note (Signed)
Requested external EKG's and medical records scanned into EHR

## 2020-05-26 NOTE — Interval H&P Note (Signed)
Patient verified name and DOB.    Order for consent IS found in EHR and matches case posting; patient verifies procedure.     Type 1b surgery, Phone assessment complete.  Orders were received.    Labs per surgeon: none  Labs per anesthesia protocol: none    Most recent ekg, echo, cath report and discharge summary requested from [facility] from 05/2019.  Pt states he "had a scare" but denies any heart issues.     Patient COVID test date 6/21; Patient did  show for the appointment. The testing center is located at the [facility] [address], [place]. If appointment is needed-patient provided telephone number of [phone].       Patient answered medical/surgical history questions at their best of ability. All prior to admission medications documented in [facility].    Patient instructed to take the following medications the day of surgery according to anesthesia guidelines with a small sip of water: use inhaler, gabapentin prn, amlodipine On the day before surgery please take Acetaminophen 1000mg  in the morning and then again before bed. You may substitute for Tylenol 650 mg.   Hold all vitamins 7 days prior to surgery and NSAIDS 5 days prior to surgery. Prescription meds to hold:ibuprofen        Patient instructed on the following:    > Arrive at [facility] A Entrance, time of arrival to be called the day before by 1700  > NPO after midnight including gum, mints, and ice chips  > Responsible adult must drive patient to the hospital, stay during surgery, and patient will need supervision 24 hours after anesthesia  > Use antibacterial soap in shower the night before surgery and on the morning of surgery  > All piercings must be removed prior to arrival.    > Leave all valuables (money and jewelry) at home but bring insurance card and ID on DOS.   > Do not wear make-up, nail polish, lotions, cologne, perfumes, powders, or oil on skin.

## 2020-05-26 NOTE — Addendum Note (Signed)
Addendum Note by Lonzo Cloud, DO at 05/26/20 0913                Author: Lonzo Cloud, DO  Service: --  Author Type: Physician       Filed: 05/29/20 1256  Encounter Date: 05/26/2020  Status: Signed          Editor: Lonzo Cloud, DO (Physician)          Addended by: Lonzo Cloud on: 05/29/2020 12:56 PM    Modules accepted: Orders

## 2020-05-26 NOTE — Interval H&P Note (Signed)
Records received. Will send to preop for anesthesia reference if needed.

## 2020-05-27 ENCOUNTER — Inpatient Hospital Stay: Primary: Family Medicine

## 2020-05-29 ENCOUNTER — Institutional Professional Consult (permissible substitution): Payer: MEDICARE | Primary: Family Medicine

## 2020-05-29 DIAGNOSIS — Z01818 Encounter for other preprocedural examination: Secondary | ICD-10-CM

## 2020-05-29 NOTE — Progress Notes (Signed)
Patient presented to the [facility] for COVID testing as ordered.  After verbal consent given by patient/caregiver,  nasal swab obtained by RN or LPN and sent to lab for processing.

## 2020-05-30 LAB — COVID-19: SARS-CoV-2, NAA: NOT DETECTED

## 2020-05-30 LAB — NOVEL CORONAVIRUS (COVID-19): SARS-CoV-2, NAA: NOT DETECTED

## 2020-05-30 LAB — INPATIENT

## 2020-05-30 LAB — SARS-COV-2, NAA 2 DAY TAT

## 2020-05-31 ENCOUNTER — Inpatient Hospital Stay: Admit: 2020-05-31 | Payer: MEDICARE | Attending: Family Medicine | Primary: Family Medicine

## 2020-05-31 ENCOUNTER — Encounter

## 2020-05-31 DIAGNOSIS — J432 Centrilobular emphysema: Secondary | ICD-10-CM

## 2020-05-31 NOTE — Progress Notes (Signed)
Referred to pulmonology

## 2020-05-31 NOTE — Telephone Encounter (Signed)
Elmo Putt from Radiology at [facility] called regarding pt. Pt had CT of chest on morning of 05/31/2020. Results are available for viewing in [facility]. There were significant finds with follow up recommendations.

## 2020-06-01 NOTE — Anesthesia Pre-Procedure Evaluation (Signed)
Relevant Problems   No relevant active problems       Anesthetic History               Review of Systems / Medical History  Patient summary reviewed and pertinent labs reviewed    Pulmonary    COPD      Smoker         Neuro/Psych              Cardiovascular    Hypertension  Valvular problems/murmurs (moderate): mitral insufficiency            Exercise tolerance: >4 METS  Comments: TTE 06/2019:  ?? The left ventricular systolic function is normal (55-65%).   ?? Grade I (mild) left ventricular diastolic dysfunction present,   consistent with impaired relaxation.   ?? The right ventricular systolic function is normal.   ?? 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   Mild-to-moderate mitral valve regurgitation. Consider TEE for further   evaluation.   ?? Myxomatous tricuspid valve with mild regurgitation.   ?? Normal chamber sizes.   ?? There is moderate pulmonary hypertension.     GI/Hepatic/Renal                Endo/Other             Other Findings            Physical Exam    Airway  Mallampati: II    Neck ROM: normal range of motion   Mouth opening: Normal     Cardiovascular  Regular rate and rhythm,  S1 and S2 normal,  no murmur, click, rub, or gallop             Dental    Dentition: Edentulous     Pulmonary      Decreased breath sounds: bilateral           Abdominal         Other Findings            Anesthetic Plan    ASA: 3  Anesthesia type: general            Anesthetic plan and risks discussed with: Patient

## 2020-06-02 ENCOUNTER — Inpatient Hospital Stay: Payer: MEDICARE

## 2020-06-02 MED ORDER — PROPOFOL 10 MG/ML IV EMUL
10 mg/mL | INTRAVENOUS | Status: DC | PRN
Start: 2020-06-02 — End: 2020-06-02
  Administered 2020-06-02: 12:00:00 via INTRAVENOUS

## 2020-06-02 MED ORDER — ROCURONIUM 10 MG/ML IV
10 mg/mL | INTRAVENOUS | Status: DC | PRN
Start: 2020-06-02 — End: 2020-06-02
  Administered 2020-06-02: 12:00:00 via INTRAVENOUS

## 2020-06-02 MED ORDER — LACTATED RINGERS IV
INTRAVENOUS | Status: DC
Start: 2020-06-02 — End: 2020-06-02
  Administered 2020-06-02 (×2): via INTRAVENOUS

## 2020-06-02 MED ORDER — OXYMETAZOLINE 0.05 % NASAL SPRAY AEROSOL
0.05 % | NASAL | Status: AC
Start: 2020-06-02 — End: ?

## 2020-06-02 MED ORDER — FENTANYL CITRATE (PF) 50 MCG/ML IJ SOLN
50 mcg/mL | Freq: Once | INTRAMUSCULAR | Status: DC
Start: 2020-06-02 — End: 2020-06-02

## 2020-06-02 MED ORDER — LIDOCAINE HCL 1 % (10 MG/ML) IJ SOLN
10 mg/mL (1 %) | INTRAMUSCULAR | Status: DC | PRN
Start: 2020-06-02 — End: 2020-06-02

## 2020-06-02 MED ORDER — FENTANYL CITRATE (PF) 50 MCG/ML IJ SOLN
50 mcg/mL | INTRAMUSCULAR | Status: AC
Start: 2020-06-02 — End: ?

## 2020-06-02 MED ORDER — OXYMETAZOLINE 0.05 % NASAL SPRAY AEROSOL
0.05 % | NASAL | Status: DC | PRN
Start: 2020-06-02 — End: 2020-06-02
  Administered 2020-06-02: 12:00:00

## 2020-06-02 MED ORDER — ONDANSETRON (PF) 4 MG/2 ML INJECTION
4 mg/2 mL | INTRAMUSCULAR | Status: DC | PRN
Start: 2020-06-02 — End: 2020-06-02
  Administered 2020-06-02: 11:00:00 via INTRAVENOUS

## 2020-06-02 MED ORDER — SUCCINYLCHOLINE CHLORIDE 20 MG/ML INJECTION
20 mg/mL | INTRAMUSCULAR | Status: DC | PRN
Start: 2020-06-02 — End: 2020-06-02
  Administered 2020-06-02: 12:00:00 via INTRAVENOUS

## 2020-06-02 MED ORDER — OXYCODONE 5 MG TAB
5 mg | Freq: Once | ORAL | Status: DC | PRN
Start: 2020-06-02 — End: 2020-06-02

## 2020-06-02 MED ORDER — CHLORHEXIDINE GLUCONATE 0.12 % MOUTHWASH
0.12 % | Freq: Four times a day (QID) | 0 refills | Status: AC
Start: 2020-06-02 — End: 2020-06-16

## 2020-06-02 MED ORDER — HYDROMORPHONE 2 MG/ML INJECTION SOLUTION
2 mg/mL | INTRAMUSCULAR | Status: DC | PRN
Start: 2020-06-02 — End: 2020-06-02
  Administered 2020-06-02 (×2): via INTRAVENOUS

## 2020-06-02 MED ORDER — NALOXONE 0.4 MG/ML INJECTION
0.4 mg/mL | INTRAMUSCULAR | Status: DC | PRN
Start: 2020-06-02 — End: 2020-06-02

## 2020-06-02 MED ORDER — FAMOTIDINE 20 MG TAB
20 mg | Freq: Once | ORAL | Status: AC
Start: 2020-06-02 — End: 2020-06-02
  Administered 2020-06-02: 10:00:00 via ORAL

## 2020-06-02 MED ORDER — LIDOCAINE (PF) 20 MG/ML (2 %) IJ SOLN
20 mg/mL (2 %) | INTRAMUSCULAR | Status: DC | PRN
Start: 2020-06-02 — End: 2020-06-02
  Administered 2020-06-02: 12:00:00 via INTRAVENOUS

## 2020-06-02 MED ORDER — DEXAMETHASONE SODIUM PHOSPHATE 4 MG/ML IJ SOLN
4 mg/mL | INTRAMUSCULAR | Status: DC | PRN
Start: 2020-06-02 — End: 2020-06-02
  Administered 2020-06-02: 12:00:00 via INTRAVENOUS

## 2020-06-02 MED ORDER — SODIUM CHLORIDE 0.9 % IV
INTRAVENOUS | Status: DC
Start: 2020-06-02 — End: 2020-06-02

## 2020-06-02 MED ORDER — SODIUM CHLORIDE 0.9 % IJ SYRG
INTRAMUSCULAR | Status: DC | PRN
Start: 2020-06-02 — End: 2020-06-02

## 2020-06-02 MED ORDER — HYDROCODONE-ACETAMINOPHEN 7.5 MG-325 MG TAB
ORAL | Status: DC | PRN
Start: 2020-06-02 — End: 2020-06-02

## 2020-06-02 MED ORDER — SODIUM CHLORIDE 0.9 % IJ SYRG
Freq: Three times a day (TID) | INTRAMUSCULAR | Status: DC
Start: 2020-06-02 — End: 2020-06-02

## 2020-06-02 MED ORDER — FENTANYL CITRATE (PF) 50 MCG/ML IJ SOLN
50 mcg/mL | INTRAMUSCULAR | Status: DC | PRN
Start: 2020-06-02 — End: 2020-06-02
  Administered 2020-06-02 (×2): via INTRAVENOUS

## 2020-06-02 MED ORDER — MIDAZOLAM 1 MG/ML IJ SOLN
1 mg/mL | Freq: Once | INTRAMUSCULAR | Status: DC | PRN
Start: 2020-06-02 — End: 2020-06-02

## 2020-06-02 MED FILL — HYDROMORPHONE 2 MG/ML INJECTION SOLUTION: 2 mg/mL | INTRAMUSCULAR | Qty: 1

## 2020-06-02 MED FILL — FAMOTIDINE 20 MG TAB: 20 mg | ORAL | Qty: 1

## 2020-06-02 MED FILL — FENTANYL CITRATE (PF) 50 MCG/ML IJ SOLN: 50 mcg/mL | INTRAMUSCULAR | Qty: 2

## 2020-06-02 MED FILL — NASAL DECONGESTANT (OXYMETAZOLINE) 0.05 % SPRAY: 0.05 % | NASAL | Qty: 2

## 2020-06-02 NOTE — Op Note (Signed)
[facility]  OPERATIVE REPORT    Name:  Caleb Robinson, Caleb Robinson  MR#:  [id number]  DOB:  August 25, 1951  ACCOUNT #:  [id number]  DATE OF SERVICE:  06/02/2020    PREOPERATIVE DIAGNOSIS:  Midline floor of mouth/ventral tongue mass.    POSTOPERATIVE DIAGNOSIS:  Midline floor of mouth/ventral tongue mass.    PROCEDURE PERFORMED:  Excisional biopsy of midline floor of mouth/tongue mass, direct suspension laryngoscopy with telescopic visualization.    SURGEON:  Lonzo Cloud, DO    ASSISTANT SURGEON:  None.    ANESTHESIA:  General anesthesia with endotracheal tube.    COMPLICATIONS:  None.    SPECIMENS REMOVED:  Midline floor of mouth/tongue mass.    IMPLANTS:  none.    ESTIMATED BLOOD LOSS:  50 mL.    DISPOSITION:  Stable, to PACU.    FINDINGS:  Midline floor of mouth/ventral tongue mass, normal laryngoscopy.    INDICATIONS FOR PROCEDURE:  This is a 69 year old male who has got several months' history of worsening tender growth type lesion to the midline floor of mouth/ventral tongue.  There is anticipation that this is rubbing onto his denture causing significant pain and enlargement of the mass.  This has not improved with conservative measures and therefore, all risks, benefits and alternatives to biopsy of the region were reviewed.  Informed consent was obtained and signed.  He was scheduled for the operating room.    DESCRIPTION OF PROCEDURE:  The patient was brought from the preoperative waiting area to the operating room, laid supine on the operating room table by the Anesthesia team.  General anesthesia was induced and endotracheal tube was placed.  A time-out was then performed.  He was then prepped and draped in the usual sterile fashion.  A side-biting mouth gag was then used to open the oral cavity.  He was edentulous and therefore, mouth gag was removed.  Sweetheart retractors were then used to elevate the tongue.  His tongue was relatively fixed at the midline due to constrained from a hard  nodular at least 1 cm in diameter mass at the midline floor of mouth and adhered into the ventral aspect of the tongue.  Therefore, a 2-0 silk suture was then placed through the tongue in the midline to help retract the tongue off the oral cavity and Sweetheart retractor was used to reflect the lower lip inferiorly.  The mass had adherence from the inferior alveolar ridge through the floor of mouth and onto the ventral aspect of the tongue.  It was grasped with a tooth forceps and circumferentially excised with a 15 blade scalpel and a couple of pieces of specimen was then passed off the field for final pathology review.  Majority of this lesion was excised.  It had a hard rubbery-type feel with minimal bleeding, which was controlled with bipolar cauterization.  Next, a gauze was placed for pressure and the remaining portion of the laryngoscopy was then performed where a Pilling laryngoscope was then placed into the oral cavity and followed down to the area of the vallecula where the vallecula was clear of any lesions or masses.  Next, it was advanced beyond the epiglottis and visualization was performed onto the supraglottic and glottic structures, and using the suspension device and under the telescope view, there was good visualization of the vocal cords, which showed no lesions or masses and no supraglottic lesions or masses.  Next, the bilateral pyriforms were visualized showing no lesions or masses.  Therefore, at this  time, the laryngoscope was removed from the suspension and brought out the oral cavity and here the patient was transferred back over to Anesthesia where he awoke from anesthesia without complications and was extubated successfully.  He was then transferred to PACU in stable condition.      [place], DO      [id number]  D:  06/02/2020 22:07  T:  06/03/2020 5:52  JOB #:  [id number]

## 2020-06-02 NOTE — Anesthesia Post-Procedure Evaluation (Signed)
Procedure(s):  BIOPSY OF TONGUE MASS  / MOUTH FLOOR MASS  DIRECT LARYNGOSCOPY W/ TELESCOPE.    general    Anesthesia Post Evaluation      Multimodal analgesia: multimodal analgesia used between 6 hours prior to anesthesia start to PACU discharge  Patient location during evaluation: PACU  Patient participation: complete - patient participated  Level of consciousness: awake and alert  Pain management: adequate  Airway patency: patent  Anesthetic complications: no  Cardiovascular status: acceptable  Respiratory status: acceptable  Hydration status: acceptable  Post anesthesia nausea and vomiting:  none  Final Post Anesthesia Temperature Assessment:  Normothermia (36.0-37.5 degrees C)      INITIAL Post-op Vital signs:   Vitals Value Taken Time   BP 128/61 06/02/20 0926   Temp 36.9 ??C (98.5 ??F) 06/02/20 0856   Pulse 67 06/02/20 0941   Resp 16 06/02/20 0941   SpO2 95 % 06/02/20 0941

## 2020-06-02 NOTE — Interval H&P Note (Signed)
Caleb Robinson waiting in car. [phone]

## 2020-06-02 NOTE — Unmapped (Signed)
Formatting of this note might be different from the original.  [facility] waiting in car. [phone]  Electronically signed by Demetria Pore, RN at 06/02/2020  6:00 AM EDT

## 2020-06-07 ENCOUNTER — Encounter

## 2020-06-07 NOTE — Progress Notes (Signed)
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  HPV status is being worked up and pending.  Patient is a smoker.  Attempted to call patient and voicemail was left with instructions to call back to review results.  I have also ordered referral to [facility] oncology department as well as to the [facility] otolaryngology head neck department.  Patient very well may need excision of mass with possible reconstruction.  PET scan has also been ordered and pending.  He is due to follow-up next week in our office.

## 2020-06-08 NOTE — Telephone Encounter (Signed)
CC: Lung nodules.  CT Chest 05/31/20 @ [facility].  PET scan ordered.       New pt referral from Dr. [name] @ [facility]. Pt also has new hx of mouth cancer.  Pt also had previous imaging @ [facility].

## 2020-06-08 NOTE — Telephone Encounter (Signed)
Name:  Caleb Robinson, Caleb Robinson.  Accession Number:  [id number]    DOB:  1951/02/16    MR#  [id number]  Physician:  Jerene Dilling ROMEO      IMPRESSION  1.  8 mm x 5 mm right upper lobe soft tissue nodule. An additional 3 mm  endobronchial nodule is seen in the right upper lobe. A follow-up noncontrast CT  scan of the chest in 6 months is recommended for reassessment unless prior  outside comparison studies can be provided demonstrating more long-term  stability. If more prompt assessment is felt to be indicated, then a PET scan  can be initially considered.    I have pushed recent CT chest to Super D protocol in event needed and have contacted patient to schedule PET scan ordered by Dr Elby Beck followed by office appointment in soonest on 7/13 same day.

## 2020-06-13 ENCOUNTER — Ambulatory Visit: Attending: Otolaryngology | Primary: Family Medicine

## 2020-06-13 ENCOUNTER — Ambulatory Visit: Admit: 2020-06-13 | Discharge: 2020-06-13 | Payer: MEDICARE | Attending: Otolaryngology | Primary: Family Medicine

## 2020-06-13 DIAGNOSIS — K1379 Other lesions of oral mucosa: Secondary | ICD-10-CM

## 2020-06-13 NOTE — Progress Notes (Signed)
HPI:  Caleb Robinson is a 69 y.o. male seen for follow up on Post OP Follow Up (Biopsy of tongue mass/ floor mass 06/02/20 . Patient c/o tongue numbness and swelling . He has been unable to eat well since surgery . ).     Patient presents for postoperative evaluation now approximately 10 days status post biopsy of floor mouth/dorsal tongue mass.  He is doing relatively well having resolution of the majority of his postoperative pain and has had no bleeding.  He does admit to having increased mobility to his tongue ever since having the partially excised mass from the floor of the mouth.  He presents for follow-up evaluation review of pathology results.    Past Medical History, Past Surgical History, Family history, Social History, and Medications were all reviewed with the patient today and updated as necessary.     No Known Allergies    Patient Active Problem List   Diagnosis Code   ??? Emphysema lung (HCC) J43.9   ??? Vision loss H54.7       Current Outpatient Medications   Medication Sig   ??? methylPREDNISolone (Medrol, Pak,) 4 mg tablet Take 1 Tablet by mouth Specific Days and Specific Times. (Patient not taking: Reported on 05/30/2020)   ??? Trelegy Ellipta 100-62.5-25 mcg inhaler Take 2 Puffs by inhalation daily.   ??? rosuvastatin (CRESTOR) 10 mg tablet Take 1 Tablet by mouth nightly.   ??? ibuprofen (MOTRIN) 800 mg tablet Take 1 Tablet by mouth two (2) times daily as needed for Pain. (Patient not taking: Reported on 06/02/2020)   ??? gabapentin (NEURONTIN) 100 mg capsule Take 2 Capsules by mouth three (3) times daily as needed for Pain (Restless leg).   ??? amLODIPine (NORVASC) 5 mg tablet Take 1 Tablet by mouth daily.   ??? albuterol (PROVENTIL HFA, VENTOLIN HFA, PROAIR HFA) 90 mcg/actuation inhaler Take 2 Puffs by inhalation every six (6) hours as needed for Wheezing.   ??? mirtazapine (REMERON) 15 mg tablet Take 1 tablet by mouth nightly   ??? amoxicillin-clavulanate (AUGMENTIN) 875-125 mg per tablet Take 1 Tablet by  mouth every twelve (12) hours.   ??? aspirin delayed-release 81 mg tablet Take 1 Tab by mouth daily.   ??? albuterol (PROVENTIL VENTOLIN) 2.5 mg /3 mL (0.083 %) nebu 3 mL by Nebulization route every six (6) hours as needed for Wheezing. One premix vial every 6hrs prn.     No current facility-administered medications for this visit.       Past Medical History:   Diagnosis Date   ??? Chronic obstructive pulmonary disease ([place])     follows with Pulmonology.  daily and rescure inhaler. no home O2.   ??? Emphysema lung ([place])    ??? HTN (hypertension)        Past Surgical History:   Procedure Laterality Date   ??? HX HERNIA REPAIR  05/2019       Social History     Tobacco Use   ??? Smoking status: Current Every Day Smoker     Packs/day: 0.75     Years: 45.00     Pack years: 33.75   ??? Smokeless tobacco: Never Used   ??? Tobacco comment: stopped but restarted Dec 2019   Substance Use Topics   ??? Alcohol use: Yes     Alcohol/week: 1.0 standard drinks     Types: 1 Cans of beer per week     Comment: occ       Family History   Problem  Relation Age of Onset   ??? Dementia Mother         ROS:    Review of Systems   Constitutional: Negative for chills and fever.   HENT: Negative for hearing loss.    Eyes: Negative for blurred vision.   Respiratory: Negative for cough.    Cardiovascular: Negative for chest pain.   Gastrointestinal: Negative for heartburn.   Genitourinary: Negative for dysuria.   Musculoskeletal: Negative for myalgias.   Skin: Negative for rash.   Neurological: Negative for dizziness.   Psychiatric/Behavioral: Negative for depression.          PHYSICAL EXAM:    There were no vitals taken for this visit.    Physical Exam  Vitals and nursing note reviewed.   Constitutional:       General: He is awake. He is not in acute distress.     Appearance: Normal appearance. He is well-developed and normal weight. He is not ill-appearing or diaphoretic.   HENT:      Head: Normocephalic and atraumatic.      Jaw: No trismus, tenderness, swelling or  pain on movement.      Salivary Glands: Right salivary gland is not diffusely enlarged or tender. Left salivary gland is not diffusely enlarged or tender.      Right Ear: Tympanic membrane, ear canal and external ear normal. No drainage, swelling or tenderness. No middle ear effusion. There is no impacted cerumen. Tympanic membrane is not perforated.      Left Ear: Tympanic membrane, ear canal and external ear normal. No drainage, swelling or tenderness.  No middle ear effusion. There is no impacted cerumen. Tympanic membrane is not perforated.      Nose: Nose normal. No nasal deformity, nasal tenderness, mucosal edema, congestion or rhinorrhea.      Right Nostril: No epistaxis, septal hematoma or occlusion.      Left Nostril: No epistaxis, septal hematoma or occlusion.      Mouth/Throat:      Lips: No lesions.      Mouth: Mucous membranes are moist. No injury or oral lesions.      Tongue: No lesions. Tongue does not deviate from midline.      Palate: No mass and lesions.      Pharynx: Oropharynx is clear. Uvula midline. No pharyngeal swelling, oropharyngeal exudate, posterior oropharyngeal erythema or uvula swelling.      Tonsils: No tonsillar exudate or tonsillar abscesses.      Comments: Mass type lesion to midline floor mouth extending to the dorsal tongue.  Partially excised from recent biopsy.  No evidence of recent or active bleeding.  Some fixation of the tongue still noted yet with somewhat more mobility as prior to procedure.  Eyes:      General: No scleral icterus.     Extraocular Movements: Extraocular movements intact.      Conjunctiva/sclera: Conjunctivae normal.      Pupils: Pupils are equal, round, and reactive to light.   Neck:      Thyroid: No thyroid mass or thyromegaly.      Trachea: Trachea and phonation normal. No tracheal tenderness.   Pulmonary:      Effort: Pulmonary effort is normal. No tachypnea.      Breath sounds: No stridor.   Musculoskeletal:      Cervical back: Normal range of motion  and neck supple. No edema, erythema or rigidity.   Lymphadenopathy:      Head:      Right  side of head: No submental, submandibular, preauricular or posterior auricular adenopathy.      Left side of head: No submental, submandibular, preauricular or posterior auricular adenopathy.      Cervical: No cervical adenopathy.      Right cervical: No superficial, deep or posterior cervical adenopathy.     Left cervical: No superficial, deep or posterior cervical adenopathy.   Skin:     General: Skin is warm and dry.   Neurological:      Mental Status: He is alert and oriented to person, place, and time.      Cranial Nerves: Cranial nerves are intact. No facial asymmetry.   Psychiatric:         Mood and Affect: Mood and affect normal.         Behavior: Behavior normal.            ASSESSMENT and PLAN        ICD-10-CM ICD-9-CM    1. Mass of floor of mouth  K13.79 528.9    2. Tongue mass  K14.8 529.8      Patient has normal postoperative findings status post biopsy for evaluation.  Well-healed biopsy site.  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  He is due to undergo PET/CT imaging in the next few upcoming days.  Referrals have been placed for oncology here locally and to [facility] his head & neck department for definitive treatment options.    Lonzo Cloud, DO  06/19/2020

## 2020-06-20 ENCOUNTER — Ambulatory Visit: Attending: Critical Care Medicine | Primary: Family Medicine

## 2020-06-20 ENCOUNTER — Ambulatory Visit
Admit: 2020-06-20 | Discharge: 2020-06-20 | Payer: MEDICARE | Attending: Critical Care Medicine | Primary: Family Medicine

## 2020-06-20 ENCOUNTER — Inpatient Hospital Stay: Admit: 2020-06-20 | Payer: MEDICARE | Primary: Family Medicine

## 2020-06-20 DIAGNOSIS — J439 Emphysema, unspecified: Secondary | ICD-10-CM

## 2020-06-20 DIAGNOSIS — C048 Malignant neoplasm of overlapping sites of floor of mouth: Secondary | ICD-10-CM

## 2020-06-20 LAB — AMB POC SPIROMETRY

## 2020-06-20 LAB — POCT GLUCOSE: POC Glucose: 93 mg/dL (ref 65–100)

## 2020-06-20 LAB — GLUCOSE, POC: Glucose (POC): 93 mg/dL (ref 65–100)

## 2020-06-20 MED ORDER — SALINE PERIPHERAL FLUSH PRN
Freq: Once | INTRAMUSCULAR | Status: AC
Start: 2020-06-20 — End: 2020-06-20
  Administered 2020-06-20: 12:00:00

## 2020-06-20 MED ORDER — DIATRIZOATE MEGLUMINE & SODIUM 66 %-10 % ORAL SOLN
66-10 % | Freq: Once | ORAL | Status: AC
Start: 2020-06-20 — End: 2020-06-20
  Administered 2020-06-20: 12:00:00 via ORAL

## 2020-06-20 MED ORDER — FLUDEOXYGLUCOSE F-18 20 MCI TO 200 MCI/ML INTRAVENOUS SOLUTION
20 mCi to 0 mCi/mL | Freq: Once | INTRAVENOUS | Status: AC
Start: 2020-06-20 — End: 2020-06-20
  Administered 2020-06-20: 12:00:00 via INTRAVENOUS

## 2020-06-20 NOTE — Progress Notes (Addendum)
Progress Notes by Lorra Hals, MD at 2020-07-05 1600                Author: Lorra Hals, MD  Service: --  Author Type: Physician       Filed: Jul 05, 2020 1715  Encounter Date: 2020-07-05  Status: Signed          Editor: Lorra Hals, MD (Physician)               [facility] Pulmonary & Critical Care: Patient Office Visit Note   [address] [name] [address]   [phone]      Patient Name:  Caleb Robinson   Date of Birth:  March 19, 1951              Date of Service:  07-05-20        Chief Complaint       Patient presents with        ?  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  By Dr. Elby Beck did reveal squamous cell cancer.  Patient had a chest CT which demonstrated 8 mm nodule in the right upper lobe and a small endobronchial lesion in  the right upper lobe.  PET scan was done and this demonstrated no hypermetabolic activity in the right upper lobe nodule but there was faint activity noted and a new 7 mm left upper lobe nodule.  There was activity in the tongue area and right submandibular  lymph node.  Patient will be going to see oncology at [facility].   Patient has been a 2 pack-a-day smoker until recently when he cut down to 1 pack a day.  He has probably accumulated over 80 pack years.  He does have a cough on a daily basis which is worse over the past couple weeks.  He has rare wheezing and no chest  pain.  He does admit to shortness of breath with activities mainly when walking and trying to carry something for 30 to 40 feet.  He works at [employer].  On flat ground he says he really does not have a problem.  He has been using Trelegy and feels  that that has been more beneficial than [employer] which he had been on before.  He does have rescue inhaler and has nebulizer but  rarely uses it.          Past Medical History:        Diagnosis  Date         ?  Chronic obstructive pulmonary disease ([place])            follows with Pulmonology.  daily and rescure inhaler. no home O2.         ?  Emphysema lung ([place])           ?  HTN (hypertension)                Problem List   Date Reviewed:  07/05/2020                    Codes  Class  Noted             Vision loss  ICD-10-CM: H54.7  ICD-9-CM: 369.9    08/05/2019                       Emphysema lung (HCC)  ICD-10-CM: J43.9   ICD-9-CM: 492.8    Unknown                            Past Surgical History:         Procedure  Laterality  Date          ?  HX HERNIA REPAIR    05/2019             Social History          Socioeconomic History         ?  Marital status:  SINGLE              Spouse name:  Not on file         ?  Number of children:  Not on file     ?  Years of education:  Not on file     ?  Highest education level:  Not on file       Occupational History         ?  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  Smoking status:  Current Every Day Smoker              Packs/day:  0.75         Years:  45.00         Pack years:  33.75         ?  Smokeless tobacco:  Never Used        ?  Tobacco comment: stopped but restarted Dec 2019       Vaping Use         ?  Vaping Use:  Never used       Substance and Sexual Activity         ?  Alcohol use:  Yes              Alcohol/week:  1.0 standard drinks         Types:  1 Cans of beer per week             Comment: occ         ?  Drug use:  Never     ?  Sexual activity:  Not on file             Comment: widow        Other Topics  Concern        ?  Not on file       Social History Narrative        ?  Not on file          Social Determinants of Health          Financial Resource Strain:         ?  Difficulty of Paying Living Expenses:        Food Insecurity:         ?  Worried About Charity fundraiser in the Last Year:      ?  Arboriculturist in the Last Year:        Transportation Needs:         ?  Lack of  Transportation (Medical):      ?  Lack of Transportation (Non-Medical):        Physical Activity:         ?  Days of Exercise per Week:      ?  Minutes of Exercise per Session:        Stress:         ?  Feeling of Stress :        Social Connections:         ?  Frequency of Communication with Friends and Family:      ?  Frequency of Social Gatherings with Friends and Family:      ?  Attends Religious Services:      ?  Active Member of Clubs or Organizations:      ?  Attends Archivist Meetings:      ?  Marital Status:        Intimate Partner Violence:         ?  Fear of Current or Ex-Partner:      ?  Emotionally Abused:      ?  Physically Abused:         ?  Sexually Abused:              Family History         Problem  Relation  Age of Onset          ?  Dementia  Mother             No Known Allergies            REVIEW OF SYSTEMS:      Review of Systems    Constitutional: Negative for chills, fever, malaise/fatigue and weight loss.    HENT: Positive for congestion. Negative for ear discharge, ear pain, hearing loss, nosebleeds, sinus pain, sore throat and tinnitus.     Eyes: Negative for blurred vision, pain and redness.    Respiratory: Positive for cough, sputum production  and shortness of breath. Negative for hemoptysis.     Cardiovascular: Negative for chest pain, palpitations and leg swelling.    Gastrointestinal: Negative for abdominal pain, blood in stool, constipation, diarrhea, heartburn, nausea and vomiting.    Genitourinary: Negative for dysuria, frequency and hematuria.    Musculoskeletal: Negative for back pain, joint pain and neck pain.    Skin: Negative for itching and rash.    Neurological: Negative for dizziness, tremors, seizures, weakness and headaches.    Endo/Heme/Allergies: Negative for environmental allergies. Does not bruise/bleed easily.    Psychiatric/Behavioral: Negative for depression, hallucinations, memory loss and suicidal ideas. The patient is not nervous/anxious.            OBJECTIVE:   Physical Exam:   Visit Vitals      BP  132/76     Pulse  74     Temp  99.5 ??F (37.5 ??C) (Temporal)     Ht  5' (1.524 m)     Wt  103 lb (46.7 kg)     SpO2  99%        BMI  20.12 kg/m??            GENERAL APPEARANCE:    The patient is thin and in no respiratory distress.       HEENT:    PERRL.  Conjunctivae unremarkable.    Nasal mucosa is without epistaxis, exudate, or polyps.  Gums and dentition are  unremarkable.  There is no oropharyngeal narrowing.  TMs are clear.       NECK/LYMPHATIC:    Symmetrical with no elevation of jugular venous pulsation.  Trachea midline. No thyroid enlargement.  Adenopathy. Both submandibular       LUNGS:    Normal respiratory effort with symmetrical lung expansion.   Breath sounds prolonged expiration, end exp. wheezes.       HEART:    There is a regular rate and rhythm.  No murmur, rub, or gallop.  There is no edema in the lower extremities.       ABDOMEN:    Soft and non-tender.  No hepatosplenomegaly.  Bowel sounds are normal.         SKIN:    There are no rashes, cyanosis, jaundice, or ecchymosis present.       EXTREMITIES:    The extremities are unremarkable without clubbing, cyanosis, joint inflammation, degenerative, or ischemic change.       MUSCULOSKELETAL:    There is no abnormal tone, muscle atrophy, or abnormal movement present.       NEURO:    The patient is alert and oriented to person, place, and time.  Memory appears intact and mood is normal.  No gross sensorimotor deficits are present.         DIAGNOSTIC TESTS:    CT of chest:     1.  8 mm x 5 mm right upper lobe soft tissue nodule. An additional 3 mm   endobronchial nodule is seen in the right upper lobe. A follow-up noncontrast CT   scan of the chest in 6 months is recommended for reassessment unless prior   outside comparison studies can be provided demonstrating more long-term   stability. If more prompt assessment is felt to be indicated, then a PET scan   can be initially considered.   ??                      CXR:      Spirometry:   Severe obstruction               Pet   IMPRESSION   1.  Elevated FDG uptake within the ventral tongue consistent with known squamous   cell carcinoma.   2.  Enlarged, FDG avid right level 2A lymph node concerning for metastasis. No   contralateral lymphadenopathy.   3.  Only faint FDG uptake associated with nodules in the left lung, one of which   is new since the CT from 05/31/2020. Previously seen right lung nodule is not FDG   avid. Differential considerations include sequelae of infection/inflammation   versus metastasis.   4.  No evidence of metastatic disease in the abdomen or pelvis.               PCXR: No results found for this or any previous visit.      CXR PA and lateral:  No results found for this or any previous visit.      PET/CT: No results found for this or any previous visit.      CT of chest w contrast:  No results found for this or any previous visit.      Screening chest CT: No results found for this or any previous visit.      CT of chest w/out contrast:   No results found for this or any previous visit.         ASSESSMENT:  ICD-10-CM  ICD-9-CM             1.  Pulmonary emphysema, unspecified emphysema type ([place])   J43.9  492.8  AMB POC SPIROMETRY Gold stage III     2.  Pulmonary nodules   R91.8  793.19   8 mm right upper lobe nodule and 7 mm left upper lobe     3.  Tongue lesion   K14.8  529.8             4.  Nicotine dependence, cigarettes, uncomplicated   Y78.295  621.3             PLAN:   ??  Total smoking cessation is advised.  Patient's tobacco use confirmed as documented in the medical record.  Discussed various smoking cessation  products including pills, patches, inhaler, gum, weaning self off, "cold [place]", and smoking cessation classes.  Discussed also with patient disease risk of ongoing smoking including CVA, lung cancer, and heart disease.  At this point, patient's commitment  to quitting is fair.  7 minutes were spent counseling patient  regarding tobacco cessation.   ??  Continue Trelegy   ??  Nebulizer with albuterol twice daily and as needed   ??  Repeat chest CT in 6 months   ??  Treatment for cancer involving tongue per oncology and surgery.   ??  Follow-up here in 6 months           Orders Placed This Encounter        ?  AMB POC SPIROMETRY           Electronically signed by  Lorra Hals, MD

## 2020-06-23 NOTE — Progress Notes (Signed)
 New Patient Abstract    Reason for Referral:  Tongue Ca    Referring Provider:  Dr. WENDI Pandy    Primary Care Provider:  Dr. LOIS Darner    Family History of Cancer/Hematologic disorders:  Mother, Cervical Ca    Presenting Symptoms:  Mouth swelling    Narrative with recent Results/Procedures/Biopsies and Dates completed:  [name] is a 69 year old male. His PMHx includes COPD, emphysema, HTN, chronic low back pain, abnormal weight loss, insomnia, hernia repair. Current smoker.  [name] originally presented to Dr. Darner on 11/23/19 for AWV with c/o abnormal weight loss and was prescribed Remeron . His next OV on 04/28/20 was with D. Fischer, PA. He had c/o soreness and swelling under his tongue for approximately 3-4 months, and pain with chewing. He also reported that a large area of swelling appeared on the right side of his neck in the last 3-4 days, with mild tenderness. He denied fever, chills, neck or throat pain, difficulty swallowing, or breathing changes. CT neck was ordered and he was referred to ENT for further evaluation.   CT neck on 05/03/20 showed bilateral submandibular sialectasis, right greater than left. The area palpable concern overlies the right ventricular gland and the main submandibular duct also is mildly dilated without a definite mass identified. ENT consultation is recommended. Additionally, mildly enlarged indeterminate lymph node within level II on the right. A metastatic lymph node cannot be excluded on the basis of this study and could be related to the findings.   [name] presented to Dr. Pandy, ENT, on 05/05/20 for evaluation. CT chest was ordered and revealed an 8 mm x 5 mm right upper lobe soft tissue nodule. An additional 3 mm endobronchial nodule was seen in the right upper lobe. A biopsy was performed on 06/02/20 with pathology showing squamous cell carcinoma.  A PET/CT was performed on 06/20/20 showing elevated FDG uptake within the ventral tongue  consistent with known squamous cell carcinoma, an enlarged, FDG avid right level 2A lymph node concerning for metastasis with no contralateral lymphadenopathy. Also, there was only faint FDG uptake associated with nodules in the left lung, one of which is new since the CT from 05/31/2020. A previously seen right lung nodule is not FDG avid. There was no evidence of metastatic disease in the abdomen or pelvis.  [name] also followed up with Dr. Leopold r/t lung nodules. Smoking cessation was strongly encouraged. RU in 6 months. His appt with Dr. ONEIDA. Day at [facility] is on 07/13/20.  [name] is referred to [facility] for consultation regarding squamous cell cancer of the tongue.    05/03/20 CT NECK SOFT TISSUE W CONT  Findings: There is minor mucosal thickening within the right maxillary sinus.   The mastoid air cells are clear.. The area palpable concern was localized with a cutaneous marker overlying the right submandibular gland. There is bilateral intraglandular sialectasis of the submandibular glands, right greater than left.  There is mild asymmetric dilatation of the submandibular duct throughout the entirety. The thyroid gland is homogeneous. There is no mucosal lesion. The parotid glands are unremarkable.   There is a 1.5 cm level II lymph node on the right with a small low density component. There is mild cervical spondylosis.. The major vascular structures are unremarkable.  Moderate emphysematous changes are present within the lung apices. There is mild biapical scarring.  IMPRESSION  1. Bilateral submandibular sialectasis, right greater than left. The area palpable concern overlies the  right ventricular gland. The main submandibular duct also is mildly dilated without a definite mass identified. ENT consultation is recommended.  2. Mildly enlarged indeterminate lymph node within level II on the right. A metastatic lymph node cannot be excluded on the basis of this study and could be related to the findings  above.     05/31/20 CT CHEST WO CONT  Findings:  CT Chest:  The base of the neck is unremarkable in appearance. No axillary, or mediastinal lymphadenopathy is seen. Evaluation of the hila is limited due to noncontrast technique although no obvious bulky hilar changes are seen. The thoracic aorta is normal in caliber.  Evaluation with lung windows demonstrates advanced centrilobular appearing emphysematous changes. An 8 mm x 5 mm (6.5 mm average diameter) nodule is seen in the right upper lobe on axial image 27. In addition, there is an endobronchial nodular density in the right upper lobe best appreciated on coronal image 50 within a segmental airway measuring 3 mm in size. Focal parenchymal changes are seen in the right middle lobe on axial image 42. The appearance suggests atelectasis or potential focal parenchymal scarring. No obstruction of central airways extending to this portion of lung is seen. No pleural effusion is seen.  Lungs are expanded without evidence for pneumothorax.  No acute osseous abnormality is seen.   Limited evaluation of the upper abdomen demonstrates no acute abnormality. Nonobstructing bilateral renal stones are present.  IMPRESSION  1. 8 mm x 5 mm right upper lobe soft tissue nodule. An additional 3 mm endobronchial nodule is seen in the right upper lobe. A follow-up noncontrast CT scan of the chest in 6 months is recommended for reassessment unless prior outside comparison studies can be provided demonstrating more long-term stability. If more prompt assessment is felt to be indicated, then a PET scan can be initially considered.    06/02/20 PATHOLOGY  FLOOR MOUTH VENTRAL TONGUE MASS: SQUAMOUS CELL CARCINOMA.  p16 HPV related neoplasia Negative    06/20/20 PET/CT TUMOR IMAGE SKULL THIGH W (INI)  Findings:  Head and Neck: Elevated tracer uptake associated with an enlarged right level 2A lymph node is seen on the comparison CT.  There is elevated FDG uptake within the ventral tongue  consistent with patient's known primary cancer.  No contralateral lymphadenopathy.  Chest: The lungs are emphysematous. There is a stable 0.8 cm anteriorly in the right upper lobe which is not hypermetabolic. Only faint FDG uptake associated with nodules in the left upper lobe, the larger which is new compared to the prior chest CT, measuring 0.7 cm, maximum SUV 2.0.  Abdomen/Pelvis: Nonobstructing right renal stone. Aortic atherosclerotic calcifications without aneurysm. Postsurgical changes anteriorly in the lower abdomen.  Bones and soft tissues: No aggressive osseous lesion or worrisome focal FDG uptake.  IMPRESSION  1. Elevated FDG uptake within the ventral tongue consistent with known squamous cell carcinoma.  2. Enlarged, FDG avid right level 2A lymph node concerning for metastasis. No contralateral lymphadenopathy.  3. Only faint FDG uptake associated with nodules in the left lung, one of which is new since the CT from 05/31/2020. Previously seen right lung nodule is not FDG avid. Differential considerations include sequelae of infection/inflammation versus metastasis.  4. No evidence of metastatic disease in the abdomen or pelvis.    Notes from referring Provider:  I have also ordered referral to [facility] oncology department as well as to the medical [facility]  otolaryngology head neck department. Patient very well may need excision of  mass with possible reconstruction.      Other pertinent information:  n/a    Presented at Tumor Board:  n/a

## 2020-06-26 NOTE — Progress Notes (Signed)
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  He underwent whole-body PET/CT imaging to further analyze the extent of his diagnosis.      1.  Elevated FDG uptake within the ventral tongue consistent with known squamous  cell carcinoma.  2.  Enlarged, FDG avid right level 2A lymph node concerning for metastasis. No  contralateral lymphadenopathy    He is scheduled to see medical oncology here locally this week and is later scheduled to see [facility] head and neck surgical department for further management.  He will likely necessitate composite resection with flap reconstruction as well as medical oncology therapy.

## 2020-06-27 ENCOUNTER — Ambulatory Visit: Primary: Family Medicine

## 2020-06-27 ENCOUNTER — Ambulatory Visit: Attending: Hematology & Oncology | Primary: Family Medicine

## 2020-06-27 ENCOUNTER — Ambulatory Visit: Admit: 2020-06-27 | Discharge: 2020-06-27 | Payer: MEDICARE | Primary: Family Medicine

## 2020-06-27 ENCOUNTER — Ambulatory Visit
Admit: 2020-06-27 | Discharge: 2020-06-27 | Payer: MEDICARE | Attending: Hematology & Oncology | Primary: Family Medicine

## 2020-06-27 DIAGNOSIS — Z008 Encounter for other general examination: Secondary | ICD-10-CM

## 2020-06-27 DIAGNOSIS — R918 Other nonspecific abnormal finding of lung field: Secondary | ICD-10-CM

## 2020-06-27 MED ORDER — LIDOCAINE 2 % MUCOSAL SOLN
2 % | 2 refills | Status: DC | PRN
Start: 2020-06-27 — End: 2020-11-22

## 2020-06-27 NOTE — Progress Notes (Signed)
Nutrition:  Assessment based on referral for Nutrition Services for diagnosis of H&N Cancer, pt of Dr. Henrene Hawking.    Food/Nutrition and Pertinent Medical History:  Diet: Soft solids  Pt w/ newly diagnosed Squamous Cell Carcinoma of Tongue, + 2A lymph node and new left lung nodule per PET scan.  Pt has been referred to [facility] and has appointment scheduled on (8/5).  Pt interested in surgery, will complete adjuvant chemo/XRT if warranted - although very adamant that he will not do any type of treatment that would entail him losing his hair stating "I've lived a good life, I would die in a month before I lose my hair."  Pt c/o gum pain under dentures, has lower and upper dentures, c/o pain w/ eating, appetite is excellent but pain impending po intake, pt was prescribed viscous lidocaine today for oral pain - denied need for opioid medication for pain at this time.  Hx emphysema, COPD, HTN.  Pt does report weight loss given issues eating.      Anthropometrics:  Ht: 64 inches, Current BW: 100#, 77% IBW, 91% UBW (~110# per pt, recorded weights in [facility] care ranging from 99#-105# over the past year), BMI = 17.2 (underweight)    Estimated Nutrition Needs:  EER: 1600-1825 kcal (35-40 kcal/kg BW)   EPR: 55-70 gm protein (1.2-1.5 gm/kg BW)   H2O: 1 ml/kcal or per MD    Nutrition Diagnosis:  Chewing difficulty R/T sequela of tongue cancer as evidenced by gum and tongue pain exacerbated by wearing bottom denture, oral pain exacerbated by chewing impacting po intake.     Intervention:  1. Continue w/ soft solids and full liquids, add Ensure Plus or comparable daily - recommend 2-4 per day  2. Viscous Lidocaine sent to pharmacy - instructed to place on gums prior to putting in dentures, use prn prior to meals to optimize po intake.   3. Referral to [facility] made today   4. [facility] visit (8/5)    Monitoring/Evaluation:  1. RD to follow up during next office visit - follow up wt status, tolerance/intake of po  diet, symptom management.      Remigio Eisenmenger RD, CSO, LD  [phone]

## 2020-06-27 NOTE — Progress Notes (Signed)
Progress Notes by Rayann Heman, MD at 06/27/20 0915                Author: Rayann Heman, MD  Service: --  Author Type: Physician       Filed: 06/27/20 1803  Encounter Date: 06/27/2020  Status: Signed          Editor: Rayann Heman, MD (Physician)                          [place] PATIENT  VISIT         Patient Name: Caleb Robinson              Date of Visit: 06/27/2020   DOB: 1951/07/30   Age:69 y.o.                        Presenting Complaint:   Tivis Wherry  is referred  for evaluation of a tongue cancer.      History of Present Illness:   Mr. Dahan was seen for the first time in our office in July 2021.  He was a gentleman with a rather substantial alcohol and tobacco history.  He has smoked upwards of 2-1/2 packs/day during the  course of his life but now is down to less than a pack.  He indicated that his alcohol use was substantial prior to 9 years ago when his intake decreased significant after the death of his wife.  In 2019/12/05 he first noticed some tongue discomfort  associated with weight loss.  By May 2021 his discomfort had significantly increased and he sought medical attention.  CT scanning of his neck on May 26 showed right greater than left submandibular gland enlargement with an indeterminate lymph node at  level 2 on the right he was seen by Dr. Elby Beck on May 28.  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   The remainder of the laryngoscopy was negative.  A subsequent PET scan was performed notable for FDG uptake within the ventral tongue.  There was an FDG avid right level 2A lymph node concerning for metastasis.  There was faint FDG uptake associated  with nodules noted in the left lung 1 of which was new from a CT on June 23.  A previously seen right lung nodule was not  FDG avid.  He has had significant pain with eating but is not in much discomfort when at rest.  He has used no pain medications.   It has impacted his eating and he has lost approximately 10 pounds.  He is slight in stature such that his weight at the time of our initial visit was 100 pounds.  The patient is scheduled to be seen at [facility] on August 5.      Medications:      Current Outpatient Medications          Medication  Sig  Dispense  Refill           ?  lidocaine (XYLOCAINE) 2 % solution  Take 15 mL by mouth as needed for Pain. Place on sore spot in mouth, can dilute with water if too thick  1 Bottle  2     ?  multivitamin (ONE A DAY) tablet  Take 1 Tablet by  mouth daily.         ?  melatonin 10 mg tab  Take  by mouth. Take at night         ?  cholecalciferol (Vitamin D3) 25 mcg (1,000 unit) cap  Take  by mouth daily.         ?  ferrous sulfate 325 mg (65 mg iron) tablet  Take  by mouth Daily (before breakfast).         ?  potassium 99 mg tablet  Take 99 mg by mouth daily.         ?  rosuvastatin (CRESTOR) 10 mg tablet  Take 1 Tablet by mouth nightly.  90 Tablet  3     ?  gabapentin (NEURONTIN) 100 mg capsule  Take 2 Capsules by mouth three (3) times daily as needed for Pain (Restless leg).  360 Capsule  3     ?  amLODIPine (NORVASC) 5 mg tablet  Take 1 Tablet by mouth daily.  90 Tablet  3     ?  mirtazapine (REMERON) 15 mg tablet  Take 1 tablet by mouth nightly  90 Tablet  0     ?  aspirin delayed-release 81 mg tablet  Take 1 Tab by mouth daily.  90 Tab  3     ?  methylPREDNISolone (Medrol, Pak,) 4 mg tablet  Take 1 Tablet by mouth Specific Days and Specific Times. (Patient not taking: Reported on 05/30/2020)  1 Dose Pack  0     ?  Trelegy Ellipta 100-62.5-25 mcg inhaler  Take 2 Puffs by inhalation daily. (Patient not taking: Reported on 06/27/2020)         ?  ibuprofen (MOTRIN) 800 mg tablet  Take 1 Tablet by mouth two (2) times daily as needed for Pain. (Patient not taking: Reported on 06/27/2020)  60 Tablet  2      ?  albuterol (PROVENTIL HFA, VENTOLIN HFA, PROAIR HFA) 90 mcg/actuation inhaler  Take 2 Puffs by inhalation every six (6) hours as needed for Wheezing. (Patient not taking: Reported on 06/27/2020)  1 Inhaler  6     ?  amoxicillin-clavulanate (AUGMENTIN) 875-125 mg per tablet  Take 1 Tablet by mouth every twelve (12) hours. (Patient not taking: Reported on 06/20/2020)  20 Tablet  0           ?  albuterol (PROVENTIL VENTOLIN) 2.5 mg /3 mL (0.083 %) nebu  3 mL by Nebulization route every six (6) hours as needed for Wheezing. One premix vial every 6hrs prn.  30 Nebule  5           Allergies:   No Known Allergies      Review of Systems:   The Review of Systems is documented in full in the internal medical record. All systems are negative other than for those noted above.       Past Medical History:     Past Medical History:        Diagnosis  Date         ?  Chronic obstructive pulmonary disease ([place])            follows with Pulmonology.  daily and rescure inhaler. no home O2.         ?  Emphysema lung ([place])           ?  HTN (hypertension)             Past Surgical History:  Past Surgical History:         Procedure  Laterality  Date          ?  HX HERNIA REPAIR    05/2019           Social History:     Social History          Tobacco Use         ?  Smoking status:  Current Every Day Smoker              Packs/day:  0.75         Years:  45.00         Pack years:  33.75         ?  Smokeless tobacco:  Never Used        ?  Tobacco comment: stopped but restarted Dec 2019       Vaping Use         ?  Vaping Use:  Never used       Substance Use Topics         ?  Alcohol use:  Yes              Alcohol/week:  1.0 standard drinks         Types:  1 Cans of beer per week             Comment: occ         ?  Drug use:  Never           Family History:     Family History         Problem  Relation  Age of Onset          ?  Dementia  Mother                Physical Examination:   General Appearance: Thin  patient in no acute distress.     Vital signs:    Visit Vitals       BP  118/72  Comment: staidng        Pulse  74     Temp  97.8 ??F (36.6 ??C) (Oral)     Resp  16         Ht  5\' 4"  (1.626 m)  Comment: taken w/o shoes 7/20        Wt  100 lb (45.4 kg)     SpO2  97%        BMI  17.16 kg/m??         Performance Status: ECOG Level  0   Distress Screening Score:      3 most recent PHQ Screens  06/27/2020        Little interest or pleasure in doing things  Not at all     Feeling down, depressed, irritable, or hopeless  Not at all        Total Score PHQ 2  0        HEENT: There is a mass with what is likely a surgical defect below the tongue extending to the floor of the mouth.   Neck: Supple. There is no thyromegaly.    Lymph nodes: There is no cervical, supraclavicular, axillary or inguinal adenopathy.  Prominent bilateral submandibular glands.   Lungs: The lungs are clear to auscultation and percussion. There is no egophony. There is no chest wall tenderness and no  use of accessory respiratory musculature.   Heart: There  is no jugular venous distention. The rate is normal and rhythm regular. The S1 and S2 are normal and there are no murmurs or rubs.     Abdomen: Soft, non-tender, bowel sounds present and normal, no appreciated hepatosplenomegaly. No palpable masses.    Skin: No rash, petechiae or ecchymoses. No evidence of malignancy.    Musculoskeletal: No bony or muscular tenderness. No joint effusions   Extremities: No cyanosis, clubbing or edema.    Neurologic: Comprehension and speech normal. Cranial nerves intact. No focality in motor, sensory or reflex exams.       Labs/Imaging:   No results found for: WBC, WBCLT, HGB, HGBP, HCT, PHCT, RBCH, PLT, MCV, HGBEXT, HCTEXT, PLTEXT      No results found for: NA, K, CL, CO2, AGAP, GLU, BUN, CREA, BUCR, GFRAA, GFRNA, CA, TBIL, AP, TP, ALB, GLOB, AGRAT, ALT      Above results reviewed with patient.             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  There is some question as to whether the lesions in the lung are metastatic or not.   I suspect not given the waxing and waning quality of these lesions and the lack of FDG activity although they are small.  In the absence of any visceral metastatic disease, I believe the optimal therapy would be to proceed initially with surgery to be  followed by local and/or systemic adjuvant therapy as dictated by the surgical findings.  At this point, what will be feasible surgically and how extensive the surgery would need to be is going to best be determined by the consultants at [facility].  The patient  seems willing to undergo surgery although has indicated that he would not agree to anything that would either cause him to lose his hair or result in the loss of a functioning tongue.      PLAN:   We await the results of the consultation at [facility].   It is unclear whether the lung nodules will impact their decision making.  One could consider a course of neoadjuvant chemotherapy prior to any surgical procedure if there is any concern about these lesions.   We have prescribed Viscous Xylocaine for his mouth soreness and have told him that should the pain be more persistent, we would provide him with stronger analgesics.   He was counseled on smoking cessation.   For now, pending [facility], we have no additional diagnostics scheduled.  We have suggested he initiate a regimen of nutritional supplements while he is having difficulty chewing in an effort to meet his protein calorie needs, especially in light  of possible surgery.            RESUSCITATION DIRECTIVES/HOSPICE CARE;   Full Support          Rayann Heman MD FACP      Oncology and Hematology Program Director   [facility] [place] [address]   P [phone]   F [phone]   [email or web address]            Elements of this note have been dictated using speech recognition software. As a  result, errors of speech recognition may have occurred.

## 2020-07-03 ENCOUNTER — Encounter

## 2020-07-03 MED ORDER — HYDROCODONE-ACETAMINOPHEN 5 MG-325 MG TAB
5-325 mg | ORAL_TABLET | ORAL | 0 refills | Status: AC | PRN
Start: 2020-07-03 — End: 2020-07-13

## 2020-07-03 NOTE — Telephone Encounter (Signed)
Pt called this AM and left message stating he needs to speak with someone concerning his records.     Tried calling pt but no answer so left message to call back.

## 2020-07-17 ENCOUNTER — Ambulatory Visit: Primary: Family Medicine

## 2020-07-17 ENCOUNTER — Ambulatory Visit: Attending: Hematology & Oncology | Primary: Family Medicine

## 2020-07-17 ENCOUNTER — Ambulatory Visit
Admit: 2020-07-17 | Discharge: 2020-07-17 | Payer: MEDICARE | Attending: Hematology & Oncology | Primary: Family Medicine

## 2020-07-17 ENCOUNTER — Ambulatory Visit: Admit: 2020-07-17 | Discharge: 2020-07-18 | Payer: MEDICARE | Primary: Family Medicine

## 2020-07-17 DIAGNOSIS — Z008 Encounter for other general examination: Secondary | ICD-10-CM

## 2020-07-17 DIAGNOSIS — C069 Malignant neoplasm of mouth, unspecified: Secondary | ICD-10-CM

## 2020-07-17 NOTE — Progress Notes (Signed)
Progress Notes by Rayann Heman, MD at 07/17/20 1000                Author: Rayann Heman, MD  Service: --  Author Type: Physician       Filed: 07/17/20 1619  Encounter Date: 07/17/2020  Status: Signed          Editor: Rayann Heman, MD (Physician)                          [place] VISIT         Patient Name: Caleb Robinson              Date of Visit: 07/17/2020   DOB: [birth year]   Age:69 y.o.                        Presenting Complaint:   Caleb Robinson  is seen in follow-up for a  tongue cancer.      History of Present Illness:   Mr. Soth was seen for the first time in our office in July 2021.  He was a gentleman with a rather substantial alcohol and tobacco history.  He has smoked upwards of 2-1/2 packs/day during the  course of his life but now is down to less than a pack.  He indicated that his alcohol use was substantial prior to 9 years ago when his intake decreased significant after the death of his wife.  In 12-08-2019 he first noticed some tongue discomfort  associated with weight loss.  By May 2021 his discomfort had significantly increased and he sought medical attention.  CT scanning of his neck on May 26 showed right greater than left submandibular gland enlargement with an indeterminate lymph node at  level 2 on the right he was seen by Dr. Elby Beck on May 28.  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   The remainder of the laryngoscopy was negative.  A subsequent PET scan was performed notable for FDG uptake within the ventral tongue.  There was an FDG avid right level 2A lymph node concerning for metastasis.  There was faint FDG uptake associated  with nodules noted in the left lung 1 of which was new from a CT on June 23.  A previously seen right lung nodule was not FDG avid.     He returns today for follow-up. Since last seen, he has been evaluated by the physicians at [facility]. Additional work-up and evaluation is planned and a virtual visit is scheduled. As anticipated he was made aware of a rather substantial surgery necessary  including resection and reconstruction. His major concern remains that when all of a sudden done he has the ability to articulate and function in his job. The remaining question is what if anything significant is represented by the pulmonary lesions.  Obviously, should the disease be metastatic, the enthusiasm for such an aggressive head neck surgery would be substantially diminished. At the present time, he is requiring no pain medications. He has lost an additional 4 to 5 pounds. He has been less  than fully compliant with the suggested nutritional supplements.   Medications:      Current Outpatient Medications          Medication  Sig  Dispense  Refill           ?  lidocaine (XYLOCAINE) 2 % solution  Take 15 mL by mouth as needed for Pain. Place on sore spot in mouth, can dilute with water if too thick  1 Bottle  2     ?  multivitamin (ONE A DAY) tablet  Take 1 Tablet by mouth daily.         ?  melatonin 10 mg tab  Take  by mouth. Take at night         ?  cholecalciferol (Vitamin D3) 25 mcg (1,000 unit) cap  Take  by mouth daily.         ?  ferrous sulfate 325 mg (65 mg iron) tablet  Take  by mouth Daily (before breakfast).         ?  potassium 99 mg tablet  Take 99 mg by mouth daily.         ?  Trelegy Ellipta 100-62.5-25 mcg inhaler  Take 2 Puffs by inhalation daily.         ?  rosuvastatin (CRESTOR) 10 mg tablet  Take 1 Tablet by mouth nightly.  90 Tablet  3     ?  ibuprofen (MOTRIN) 800 mg tablet  Take 1 Tablet by mouth two (2) times daily as needed for Pain.  60 Tablet  2     ?  gabapentin (NEURONTIN) 100 mg capsule  Take 2 Capsules by mouth three (3) times daily as needed for Pain (Restless leg).  360 Capsule  3     ?  amLODIPine (NORVASC) 5 mg tablet   Take 1 Tablet by mouth daily.  90 Tablet  3     ?  mirtazapine (REMERON) 15 mg tablet  Take 1 tablet by mouth nightly  90 Tablet  0     ?  aspirin delayed-release 81 mg tablet  Take 1 Tab by mouth daily.  90 Tab  3     ?  albuterol (PROVENTIL VENTOLIN) 2.5 mg /3 mL (0.083 %) nebu  3 mL by Nebulization route every six (6) hours as needed for Wheezing. One premix vial every 6hrs prn.  30 Nebule  5           ?  albuterol (PROVENTIL HFA, VENTOLIN HFA, PROAIR HFA) 90 mcg/actuation inhaler  Take 2 Puffs by inhalation every six (6) hours as needed for Wheezing. (Patient not taking: Reported on 06/27/2020)  1 Inhaler  6           Allergies:   No Known Allergies      Review of Systems:   The Review of Systems is documented in full in the internal medical record. All systems are negative other than for those noted above.       Past Medical History:     Past Medical History:        Diagnosis  Date         ?  Chronic obstructive pulmonary disease ([place])            follows with Pulmonology.  daily and rescure inhaler. no home O2.         ?  Emphysema lung ([place])           ?  HTN (hypertension)             Past Surgical History:     Past Surgical History:         Procedure  Laterality  Date          ?  HX HERNIA  REPAIR    05/2019           Social History:     Social History          Tobacco Use         ?  Smoking status:  Current Every Day Smoker              Packs/day:  0.75         Years:  45.00         Pack years:  33.75         ?  Smokeless tobacco:  Never Used        ?  Tobacco comment: stopped but restarted Dec 2019       Vaping Use         ?  Vaping Use:  Never used       Substance Use Topics         ?  Alcohol use:  Yes              Alcohol/week:  1.0 standard drinks         Types:  1 Cans of beer per week             Comment: occ         ?  Drug use:  Never           Family History:     Family History         Problem  Relation  Age of Onset          ?  Dementia  Mother                Physical Examination:   General  Appearance: Thin  patient in no acute distress.    Vital signs:    Visit Vitals      BP  (!) 113/55     Pulse  67     Temp  97.8 ??F (36.6 ??C) (Oral)     Resp  14     Ht  5\' 4"  (1.626 m)     Wt  95 lb 9.6 oz (43.4 kg)     SpO2  99%        BMI  16.41 kg/m??         Performance Status: ECOG Level  0   Distress Screening Score:      3 most recent PHQ Screens  07/17/2020        Little interest or pleasure in doing things  Not at all     Feeling down, depressed, irritable, or hopeless  Not at all        Total Score PHQ 2  0        HEENT: There is a mass with what is likely a surgical defect below the tongue extending to the floor of the mouth.   Neck: Supple. There is no thyromegaly.    Lymph nodes: There is no cervical, supraclavicular, axillary or inguinal adenopathy.  Prominent bilateral submandibular glands.   Lungs: The lungs are clear to auscultation and percussion. There is no egophony. There is no chest wall tenderness and no  use of accessory respiratory musculature.   Heart: There is no jugular venous distention. The rate is normal and rhythm regular. The S1 and S2 are normal and there are no murmurs or rubs.     Abdomen: Soft, non-tender, bowel sounds present and normal, no appreciated hepatosplenomegaly. No palpable masses.  Skin: No rash, petechiae or ecchymoses. No evidence of malignancy.    Extremities: No cyanosis, clubbing or edema.          Labs/Imaging:   No results found for: WBC, WBCLT, HGB, HGBP, HCT, PHCT, RBCH, PLT, MCV, HGBEXT, HCTEXT, PLTEXT, HGBEXT, HCTEXT, PLTEXT      No results found for: NA, K, CL, CO2, AGAP, GLU, BUN, CREA, BUCR, GFRAA, GFRNA, CA, TBIL, AP, TP, ALB, GLOB, AGRAT, ALT      Above results reviewed with patient.             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  There is some question as to whether the lesions in the lung are metastatic or not.   I suspect not given the waxing and waning  quality of these lesions and the lack of FDG activity although they are small. He is still under consideration for surgery at [facility]. Additional evaluation there is pending including a virtual visit with all of  the involved subspecialties.   PLAN:   He will be referred to pulmonary for evaluation of the lung nodules. These are quite small and there may be limited options although certainly a navigation bronchoscopy can be attempted. His case will  be discussed at thoracic conference on Wednesday.      I spent 32 minutes with the patient discussing the evaluation at [facility] and the planned work-up.            RESUSCITATION DIRECTIVES/HOSPICE CARE;   Full Support          Rayann Heman MD FACP      Oncology and Hematology Program Director   [facility] [place] [address]   P [phone]   F [phone]   [email or web address]            Elements of this note have been dictated using speech recognition software. As a result, errors of speech recognition may have occurred.

## 2020-07-17 NOTE — Progress Notes (Signed)
Nutrition F/U:  Assessment:  Pt seen during office visit w/ Dr. Sonny Masters, completed consult at [facility] for H&N surgery - final decision pending evaluation of lung nodules; pt has been referred to [facility], appears to be a pt of Dr. Daryll Drown, and will be presented in thoracic tumor board (8/11).  Pt reports improved tolerance of po diet w/ use of viscous lidocaine, not drinking Ensure Plus or comparable - encouraged to add 2-4 per day.  Current BW: 95#, down 5# over the past 2 weeks.     Intervention:  1. Continue w/ mech soft diet, full liquids.  Add Ensure Plus or comparable (2-4 per day) - referral has been made to [place] [facility].   2. Referral to [facility], pt will be presented at thoracic tumor board on (8/11).   3. Pt will have follow up w/ [facility] once lung nodules can be evaluated.  Dr. Sonny Masters discussed possibility of being metastatic disease from H&N vs infection vs different primary.      Monitoring/Evaluation:  1. RD to follow up during next office visit - follow up wt status, tolerance/intake of po diet, symptom management.    [place], [place], [place]

## 2020-07-17 NOTE — Telephone Encounter (Signed)
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  H&N Surgery Eval at [facility] completed, need to determine cause of lung nodules prior to decision about surgery.    Urgent referral for an established patient from Dr. Baruch Gouty @ Hem/Onc.  Pt was seen in the office by Dr. Daryll Drown 06/20/20, as a new patient.  Pt had a PET scan 06/20/20 @ [facility].

## 2020-07-18 NOTE — Telephone Encounter (Signed)
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  H&N Surgery Eval at [facility] completed, need to determine cause of lung nodules prior to decision about surgery.              I was able to push CT chest 05/31/2020 to Super D.  PET scan was done on 06/20/2020.  Per Dr Daryll Drown, see if you are able to navigate to pulmonary nodules.  If not, Dr Daryll Drown approves referral to IR for potential BX of PET positive nodule. Dr Daryll Drown and I have discussed that this nodules are small for BX and could require surgical consult.     Please advise.

## 2020-07-18 NOTE — Telephone Encounter (Signed)
Spoke with Dr Berline Chough who feels patient should be discussed at thoracic conference tomorrow.  I have e-mailed Dicky Doe to add patient on for thoracic conference on 07/19/2020 per MD.

## 2020-07-19 ENCOUNTER — Encounter

## 2020-07-19 NOTE — Progress Notes (Signed)
Caleb Robinson  24-Dec-1950      07/19/2020      Situation was presented at tumor conference. Consensus was to attempt a biopsy from on of the left sided lesion and potentially a VATS if this were not successful. Given the morbidity of the surgery contemplated, it is critical to know whether he has metastatic disease before clearing for surgery. Order placed for biopsy. I received his VM twice while attempting to him a message regarding today's conversation.       Rayann Heman, MD

## 2020-07-19 NOTE — Telephone Encounter (Signed)
Confirmed that patient is on thoracic conference today

## 2020-07-20 ENCOUNTER — Encounter

## 2020-07-20 NOTE — Progress Notes (Signed)
Spoke with patient regarding the need for a covid test, scheduled for 07/21/20 at 1000 am. Pt aware and plans to get this done tomorrow.

## 2020-07-21 ENCOUNTER — Institutional Professional Consult (permissible substitution): Payer: MEDICARE | Primary: Family Medicine

## 2020-07-21 DIAGNOSIS — Z01818 Encounter for other preprocedural examination: Secondary | ICD-10-CM

## 2020-07-21 NOTE — Progress Notes (Signed)
Late Entry - Patient presented to the [facility] for COVID testing as ordered.  After verbal consent obtained, swab obtained and sent to lab for processing.

## 2020-07-23 LAB — COVID-19: SARS-CoV-2, NAA: NOT DETECTED

## 2020-07-23 LAB — NOVEL CORONAVIRUS (COVID-19): SARS-CoV-2, NAA: NOT DETECTED

## 2020-07-23 LAB — INPATIENT

## 2020-07-23 LAB — SARS-COV-2, NAA 2 DAY TAT

## 2020-07-25 ENCOUNTER — Encounter

## 2020-07-25 ENCOUNTER — Inpatient Hospital Stay: Admit: 2020-07-25 | Payer: MEDICARE | Attending: Hematology & Oncology | Primary: Family Medicine

## 2020-07-25 DIAGNOSIS — R918 Other nonspecific abnormal finding of lung field: Secondary | ICD-10-CM

## 2020-07-25 LAB — BASIC METABOLIC PANEL
Anion Gap: 1 mmol/L — ABNORMAL LOW (ref 7–16)
BUN: 13 MG/DL (ref 8–23)
CO2: 32 mmol/L (ref 21–32)
Calcium: 9.2 MG/DL (ref 8.3–10.4)
Chloride: 109 mmol/L — ABNORMAL HIGH (ref 98–107)
Creatinine: 0.91 MG/DL (ref 0.8–1.5)
EGFR IF NonAfrican American: >60 mL/min/{1.73_m2} (ref >60)
GFR African American: >60 mL/min/{1.73_m2} (ref >60)
Glucose: 87 mg/dL (ref 65–100)
Potassium: 4.6 mmol/L (ref 3.5–5.1)
Sodium: 142 mmol/L (ref 136–145)

## 2020-07-25 LAB — CBC
Hematocrit: 40.8 % — ABNORMAL LOW (ref 41.1–50.3)
Hemoglobin: 13 g/dL — ABNORMAL LOW (ref 13.6–17.2)
MCH: 31.6 PG (ref 26.1–32.9)
MCHC: 31.9 g/dL (ref 31.4–35.0)
MCV: 99.3 FL — ABNORMAL HIGH (ref 79.6–97.8)
MPV: 10.6 FL (ref 9.4–12.3)
NRBC Absolute: 0 10*3/uL (ref 0.0–0.2)
Platelets: 379 10*3/uL (ref 150–450)
RBC: 4.11 M/uL — ABNORMAL LOW (ref 4.23–5.6)
RDW: 14 % (ref 11.9–14.6)
WBC: 8.7 10*3/uL (ref 4.3–11.1)

## 2020-07-25 LAB — METABOLIC PANEL, BASIC
Anion gap: 1 mmol/L — ABNORMAL LOW (ref 7–16)
BUN: 13 MG/DL (ref 8–23)
CO2: 32 mmol/L (ref 21–32)
Calcium: 9.2 MG/DL (ref 8.3–10.4)
Chloride: 109 mmol/L — ABNORMAL HIGH (ref 98–107)
Creatinine: 0.91 MG/DL (ref 0.8–1.5)
GFR est AA: >60 mL/min/{1.73_m2} (ref >60)
GFR est non-AA: >60 mL/min/{1.73_m2} (ref >60)
Glucose: 87 mg/dL (ref 65–100)
Potassium: 4.6 mmol/L (ref 3.5–5.1)
Sodium: 142 mmol/L (ref 136–145)

## 2020-07-25 LAB — CBC W/O DIFF
ABSOLUTE NRBC: 0 10*3/uL (ref 0.0–0.2)
HCT: 40.8 % — ABNORMAL LOW (ref 41.1–50.3)
HGB: 13 g/dL — ABNORMAL LOW (ref 13.6–17.2)
MCH: 31.6 PG (ref 26.1–32.9)
MCHC: 31.9 g/dL (ref 31.4–35.0)
MCV: 99.3 FL — ABNORMAL HIGH (ref 79.6–97.8)
MPV: 10.6 FL (ref 9.4–12.3)
PLATELET: 379 10*3/uL (ref 150–450)
RBC: 4.11 M/uL — ABNORMAL LOW (ref 4.23–5.6)
RDW: 14 % (ref 11.9–14.6)
WBC: 8.7 10*3/uL (ref 4.3–11.1)

## 2020-07-25 MED ORDER — SODIUM CHLORIDE 0.9 % IV
Freq: Once | INTRAVENOUS | Status: AC
Start: 2020-07-25 — End: 2020-07-25

## 2020-07-25 MED ORDER — FENTANYL CITRATE (PF) 50 MCG/ML IJ SOLN
50 mcg/mL | INTRAMUSCULAR | Status: DC | PRN
Start: 2020-07-25 — End: 2020-07-29

## 2020-07-25 MED ORDER — LIDOCAINE HCL 2 % (20 MG/ML) IJ SOLN
20 mg/mL (2 %) | Freq: Once | INTRAMUSCULAR | Status: AC
Start: 2020-07-25 — End: 2020-07-25

## 2020-07-25 MED ORDER — LIDOCAINE HCL 2 % (20 MG/ML) IJ SOLN
20 mg/mL (2 %) | INTRAMUSCULAR | Status: AC
Start: 2020-07-25 — End: ?

## 2020-07-25 MED ORDER — MIDAZOLAM 1 MG/ML IJ SOLN
1 mg/mL | INTRAMUSCULAR | Status: AC
Start: 2020-07-25 — End: ?

## 2020-07-25 MED ORDER — FENTANYL CITRATE (PF) 50 MCG/ML IJ SOLN
50 mcg/mL | INTRAMUSCULAR | Status: AC
Start: 2020-07-25 — End: ?

## 2020-07-25 MED ORDER — MIDAZOLAM 1 MG/ML IJ SOLN
1 mg/mL | INTRAMUSCULAR | Status: DC | PRN
Start: 2020-07-25 — End: 2020-07-29

## 2020-07-25 MED FILL — MIDAZOLAM 1 MG/ML IJ SOLN: 1 mg/mL | INTRAMUSCULAR | Qty: 4

## 2020-07-25 MED FILL — XYLOCAINE 20 MG/ML (2 %) INJECTION SOLUTION: 20 mg/mL (2 %) | INTRAMUSCULAR | Qty: 20

## 2020-07-25 MED FILL — FENTANYL CITRATE (PF) 50 MCG/ML IJ SOLN: 50 mcg/mL | INTRAMUSCULAR | Qty: 4

## 2020-07-25 NOTE — H&P (Signed)
H&P by Vladimir Creeks, MD at 07/25/20 0800                Author: Vladimir Creeks, MD  Service: Physician Assistant  Author Type: Physician       Filed: 07/25/20 0802  Date of Service: 07/25/20 0800  Status: Addendum          Editor: Vladimir Creeks, MD (Physician)          Related Notes: Original Note by Marlene Bast (Physician Assistant) filed at 07/25/20  [phone]                          Department of Interventional Radiology   [phone]      History and Physical          Patient:  Caleb Robinson  MRN:  [id number]   SSN:  [id number]          Date of Birth:  May 30, 1951   Age:  69 y.o.   Sex:  male         Primary Care Provider:  [name], MD   Referring Physician:  Rayann Heman, MD        Subjective:        Chief Complaint: Two tiny left lung nodules.        History of the Present Illness:  The patient is a 69 y.o.  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 By report, a right lung nodule is chronic.  The two left lung nodules were not  present on CT scans from May and June but found on PET/CT in July.        + tobacco and ETOH abuse.  NPO. No acute complaints.            Past Medical History:        Diagnosis  Date         ?  Chronic obstructive pulmonary disease ([place])            follows with Pulmonology.  daily and rescure inhaler. no home O2.         ?  Emphysema lung ([place])           ?  HTN (hypertension)            Past Surgical History:         Procedure  Laterality  Date          ?  HX HERNIA REPAIR    05/2019        Review of Systems:     Pertinent items are noted in the History of Present Illness.        Prior to Admission medications             Medication  Sig  Start Date  End Date  Taking?  Authorizing Provider            lidocaine (XYLOCAINE) 2 % solution  Take 15 mL by mouth as needed for Pain. Place on sore spot in mouth, can dilute with water if too thick  06/27/20      Rayann Heman, MD            multivitamin (ONE A  DAY) tablet  Take 1 Tablet by mouth daily.        Provider, Historical     melatonin 10 mg tab  Take  by mouth.  Take at night        Provider, Historical     cholecalciferol (Vitamin D3) 25 mcg (1,000 unit) cap  Take  by mouth daily.        Provider, Historical     ferrous sulfate 325 mg (65 mg iron) tablet  Take  by mouth Daily (before breakfast).        Provider, Historical     potassium 99 mg tablet  Take 99 mg by mouth daily.        Provider, Historical     Trelegy Ellipta 100-62.5-25 mcg inhaler  Take 2 Puffs by inhalation daily.  05/08/20      Provider, Historical     rosuvastatin (CRESTOR) 10 mg tablet  Take 1 Tablet by mouth nightly.  05/17/20      [name], MD     ibuprofen (MOTRIN) 800 mg tablet  Take 1 Tablet by mouth two (2) times daily as needed for Pain.  05/17/20      [name], MD     gabapentin (NEURONTIN) 100 mg capsule  Take 2 Capsules by mouth three (3) times daily as needed for Pain (Restless leg).  05/17/20      [name], MD     amLODIPine (NORVASC) 5 mg tablet  Take 1 Tablet by mouth daily.  05/17/20      [name], MD     albuterol (PROVENTIL HFA, VENTOLIN HFA, PROAIR HFA) 90 mcg/actuation inhaler  Take 2 Puffs by inhalation every six (6) hours as needed for Wheezing.   Patient not taking: Reported on 06/27/2020  05/17/20      [name], MD     mirtazapine (REMERON) 15 mg tablet  Take 1 tablet by mouth nightly  05/08/20      [name], MD     aspirin delayed-release 81 mg tablet  Take 1 Tab by mouth daily.  11/23/19      [name], MD            albuterol (PROVENTIL VENTOLIN) 2.5 mg /3 mL (0.083 %) nebu  3 mL by Nebulization route every six (6) hours as needed for Wheezing. One premix vial every 6hrs prn.  11/23/19      [name], MD         No Known Allergies        Family History         Problem  Relation  Age of Onset          ?  Dementia  Mother            Social History          Tobacco Use         ?  Smoking status:  Current Every Day Smoker              Packs/day:  0.75          Years:  45.00         Pack years:  33.75         ?  Smokeless tobacco:  Never Used        ?  Tobacco comment: stopped but restarted Dec 2019       Substance Use Topics         ?  Alcohol use:  Yes              Alcohol/week:  1.0 standard drinks         Types:  1 Cans  of beer per week             Comment: occ           Objective:          Physical Examination:       Vitals:           07/25/20 0717  07/25/20 0718         BP:  (!) 141/62       Pulse:  64       Resp:  18       Temp:  97 ??F (36.1 ??C)       SpO2:  97%       Weight:    43.1 kg (95 lb)         Height:    5\' 4"  (1.626 m)        Pain Assessment   Pain Intensity 1: 0 (07/25/20 0715)   Patient Stated Pain Goal: 0      HEART: regular rate and rhythm   LUNG: clear, diminished   ABDOMEN: normal findings: soft, non-tender   EXTREMITIES: warm, no edema      Laboratory:      No results found for: NA, K, CL, CO2, AGAP, GLU, BUN, CREA, GFRAA, GFRNA, CA, MG, PHOS, ALB, TBIL, TP, ALB, GLOB, AGRAT, ALT   No results found for: WBC, HGB, HCT, PLT, HGBEXT, HCTEXT, PLTEXT   No results found for: APTT, PTP, INR, INREXT        Assessment:        SCC tongue, presumed cervical LN involvement, possible lung mets.           Hospital Problems   Date Reviewed: 07/18/2020             None                       Plan:        Planned Procedure:  Biopsy small left sided lung nodule.  Sedation.       Risks, benefits, and alternatives reviewed with patient and he agrees to proceed with the  procedure.           Signed By:  Oval Linsey, PA-C           July 25, 2020         I discussed at length and in detail the biopsy procedure, including the high risk of PTX and inconclusive biopsy results given the tiny lesion, severe COPD, depth of the nodules.  Questions answered.  He wishes to proceed.      Vladimir Creeks, MD

## 2020-07-25 NOTE — Procedures (Signed)
Procedures by Vladimir Creeks, MD at 07/25/20 0800                Author: Vladimir Creeks, MD  Service: --  Author Type: Physician       Filed: 07/25/20 0833  Date of Service: 07/25/20 0800  Status: Signed          Editor: Vladimir Creeks, MD (Physician)                    Department of Interventional Radiology   [phone]          Interventional Radiology Brief Procedure Note          Patient: Caleb Robinson  MRN: [id number]   SSN: [id number]          Date of Birth: Oct 17, 1951   Age: 69 y.o.   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   Two, faintly FDG-avid, LUL nodules are new (06/20/20) since 05/31/20 CT.        Post-Procedure Diagnosis: SAME      Procedure(s): Localizing CT scan for attempted biopsy.      Brief Description of Procedure: as above.       Performed By: Vladimir Creeks, MD       Assistants: None      Anesthesia:None      Estimated Blood Loss: None      Specimens:  None      Implants:  None      Findings: The two LUL nodules in question are not visible on today's CT scan.  There is a new 11 mm nodule in the RUL, adjacent to  the major fissure.  The known anterior RUL is unchanged.         Complications: None      Recommendations: Given the fleeting nature of the two LUL nodules,  and the new peri-fissural RUL nodule, no biopsy was performed today.  I do recommend surveillance CT imaging in 3-6 months.  If any lesion enlarges, or is at least visible on two consecutive studies, then reconsider biopsy.          Follow Up: Dr Sonny Masters.           Signed By:  Vladimir Creeks, MD           July 25, 2020

## 2020-08-18 ENCOUNTER — Encounter

## 2020-08-18 MED ORDER — TRELEGY ELLIPTA 100 MCG-62.5 MCG-25 MCG POWDER FOR INHALATION
Freq: Every day | RESPIRATORY_TRACT | 3 refills | Status: DC
Start: 2020-08-18 — End: 2021-02-14

## 2020-08-18 MED ORDER — TRELEGY ELLIPTA 100 MCG-62.5 MCG-25 MCG POWDER FOR INHALATION
Freq: Every day | RESPIRATORY_TRACT | 3 refills | Status: DC
Start: 2020-08-18 — End: 2020-08-18

## 2020-08-18 MED ORDER — IBUPROFEN 800 MG TAB
800 mg | ORAL_TABLET | Freq: Two times a day (BID) | ORAL | 2 refills | Status: DC | PRN
Start: 2020-08-18 — End: 2020-11-19

## 2020-08-18 NOTE — Telephone Encounter (Signed)
Pt called stating he still needs refills on Tetley and ibuprofen to be sent in to [facility]

## 2020-08-18 NOTE — Telephone Encounter (Signed)
Caleb Robinson from [facility] called requesting clarifications  on Trelegy inhaler. Directions call for 2 puffs daily and each inhaler contains 30 puffs, so the quantity will need to be changed

## 2020-09-05 ENCOUNTER — Encounter

## 2020-09-05 MED ORDER — MIRTAZAPINE 15 MG TAB
15 mg | ORAL_TABLET | ORAL | 0 refills | Status: DC
Start: 2020-09-05 — End: 2020-11-22

## 2020-09-05 MED ORDER — ROSUVASTATIN 10 MG TAB
10 mg | ORAL_TABLET | Freq: Every evening | ORAL | 0 refills | Status: DC
Start: 2020-09-05 — End: 2021-01-07

## 2020-09-12 LAB — HEMOGLOBIN A1C
Estimated Avg Glucose, External: 97 mg/dL (ref 77–114)
Hemoglobin A1C, External: 5 % (ref 4.3–5.6)

## 2020-09-27 NOTE — Consults (Signed)
Formatting of this note is different from the original.  [facility] [place]    Patient Name: Caleb Robinson  DOB: 07/20/1951  Age: 69 y.o.  MRN: [id number]  Admission Date: 09/27/20    Admitting Attending: Edythe Lynn   Code Status: Full code  Medical decision maker: Adult Child (Name: Caleb Robinson)                                                 History of Present Illness     Caleb Robinson is a 69 y.o male newly diagnosed with FOM lesion, cervical node, and lung nodules on PET scan. Symptoms started approximately 3-4 months ago with mild tenderness under tongue, progressing to left tender neck mass. He has a significant past medical history of COPD and currently still smokes. Unknown biopsy timing. Patient underwent composite oral cavity resection, segmental mandibulectomy, bilateral neck dissection with left fibula free flap, STSG, tracheostomy, and total odontotomy.   He presents to the ICU on Neo-synephrine drip for hypotension, and Precedex drip for sedation. No required MAP goals.                                                  Past Medical & Surgical History   Past Medical History:  COPD, smoking, HTN, HLD, Depression     Past Surgical History:  History reviewed. No pertinent surgical history.     Social History:  Social History     Socioeconomic History   ? Marital status: Single     Spouse name: None   ? Number of children: None   ? Years of education: None   ? Highest education level: None   Occupational History   ? None   Tobacco Use   ? Smoking status: Current Every Day Smoker     Packs/day: 0.25     Types: Cigarettes   ? Smokeless tobacco: Never Used   Substance and Sexual Activity   ? Alcohol use: Yes     Comment: 1 drink a month   ? Drug use: Never   ? Sexual activity: None   Other Topics Concern   ? None   Social History Narrative   ? None     Social Determinants of Health     Financial Resource Strain:    ? Difficulty of Paying Living  Expenses: Not on file   Food Insecurity:    ? Worried About Running Out of Food in the Last Year: Not on file   ? Ran Out of Food in the Last Year: Not on file   Transportation Needs:    ? Lack of Transportation (Medical): Not on file   ? Lack of Transportation (Non-Medical): Not on file   Physical Activity:    ? Days of Exercise per Week: Not on file   ? Minutes of Exercise per Session: Not on file   Stress:    ? Feeling of Stress : Not on file   Social Connections:    ? Frequency of Communication with Friends and Family: Not on file   ? Frequency of Social Gatherings with Friends and Family: Not on file   ? Attends Religious Services:  Not on file   ? Active Member of Clubs or Organizations: Not on file   ? Attends Archivist Meetings: Not on file   ? Marital Status: Not on file   Intimate Partner Violence:    ? Fear of Current or Ex-Partner: Not on file   ? Emotionally Abused: Not on file   ? Physically Abused: Not on file   ? Sexually Abused: Not on file   Housing Stability:    ? Unable to Pay for Housing in the Last Year: Not on file   ? Number of Places Lived in the Last Year: Not on file   ? Unstable Housing in the Last Year: Not on file       Family History:  Family History   Problem Relation Age of Onset   ? Anesthetic Reactions Neg Hx        Outpatient Medications:  Outpatient Medications Marked as Taking for the 09/27/20 encounter [facility] Encounter)   Medication Sig Dispense Refill   ? albuterol 0.083 % (Proventil) 2.5 mg /3 mL (0.083 %) nebulizer solution Inhale 2.5 mg into the lungs if needed.      ? albuterol 90 mcg/actuation inhaler Inhale 2 puffs into the lungs if needed.      ? amLODIPine (NORVASC) 5 mg tablet Take 5 mg by mouth daily.      ? ascorbic acid (vitamin C) (vitamin C) 100 mg tablet Take 100 mg by mouth daily.     ? cholecalciferol (vitamin D3) 25 mcg (1,000 unit) capsule Take 1,000 Units by mouth daily.      ? ferrous sulfate 325 mg (65 mg iron) tablet Take 65 mg of iron by  mouth each morning before breakfast.      ? fluticasone-umeclidinium-vilanterol (Trelegy Ellipta) 100-62.5-25 mcg/dose powder inhaler Inhale 2 puffs into the lungs once daily.      ? gabapentin (NEURONTIN) 100 mg capsule Take 300 mg by mouth 2 times daily.      ? ibuprofen (MOTRIN) 800 mg tablet Take 800 mg by mouth if needed.      ? indomethacin (INDOCIN) 25 mg capsule Take 25 mg by mouth daily.      ? lidocaine (XYLOCAINE) 2 % mucosal solution Apply 5 mL topically 3 times daily. Swish/spit PRN to help with oral cavity pain r/t cancer 500 mL 1   ? lidocaine 2 % solution if needed.      ? melatonin 10 mg tablet Take 10 mg by mouth at bedtime as needed.      ? mirtazapine (REMERON) 15 mg tablet Take 15 mg by mouth at bedtime.      ? multivitamin tablet Take 1 tablet by mouth daily.      ? OTHER MEDICATION daily. appetite supplement     ? potassium 99 mg Tablet Take 99 mg by mouth daily.      ? rosuvastatin (CRESTOR) 10 mg tablet Take 10 mg by mouth daily.      ? zinc gluconate 50 mg tablet Take 50 mg by mouth daily.         Allergies:  Cat Dander                                                 Review of Systems   Unable to complete due to Anesthesia  Current Medications   Scheduled Medications:  ? acetaminophen  650 mg Per OG/NG Tube 4 times per day   ? ampicillin-sulbactam  3 g Intravenous Q6H   ? [START ON 09/28/2020] aspirin  325 mg Per OG/NG Tube Daily   ? bacitracin zinc   Topical BID   ? celecoxib  200 mg Oral BID   ? docusate sodium  100 mg Per G Tube BID   ? enoxaparin  30 mg Subcutaneous 2 times per day   ? famotidine  20 mg Oral BID   ? [START ON 09/28/2020] gabapentin  300 mg Per OG/NG Tube 3 times per day   ? mirtazapine  15 mg Per OG/NG Tube QHS   ? [START ON 09/28/2020] rosuvastatin  10 mg Oral Daily   ? sennosides  8.8 mg Per G Tube BID     Infusions:  ? dexmedeTOMIDine (PRECEDEX) 4 mcg/mL in NaCl 0.9% 100 mL infusion 0.5 mcg/kg/hr (09/27/20 1906)   ?  lactated ringers infusion 75 mL/hr (09/27/20 1906)   ? phenylephrine (Neo-Synephrine) 160 mcg/mL in NaCl 0.9% 250 mL infusion 60 mcg/min (09/27/20 1930)   ? ropivacaine (PF) (NAROPIN) 0.2 % in NaCl 0.9% 400 mL ON-Q (PAINBUSTER) irrigation pump     ? ropivacaine (PF) (NAROPIN) 0.2 % in NaCl 0.9% 750 mL ON-Q (PAINBUSTER SELECT-A-FLOW) irrigation       PRN Medications:  ondansetron, 8 mg, Q8H PRN  oxycodone, 5-10 mg, Q4H PRN  promethazine, 12.5 mg, Q6H PRN                                                 Physical Exam, Laboratories, & Imaging   Vital Signs:  Pulse:  Pulse Readings from Last 1 Encounters:   09/27/20 74      Blood Pressure:   BP Readings from Last 1 Encounters:   09/27/20 118/44      Last Temp:  Temp Readings from Last 1 Encounters:   09/27/20 36.5 C (97.7 F) (Temporal)      Temp Avg/Min/Max: Temp (24hrs), Avg:36.4 C (97.5 F), Min:36.3 C (97.3 F), Max:36.5 C (97.7 F)     Resp Rate:   Resp Readings from Last 1 Encounters:   09/27/20 23      Pulse Ox:   SpO2 Readings from Last 1 Encounters:   09/27/20 99%     Physical Examination/Physiologic Support:   General: No apparent distress   Neuro: GCS:  Eye Opening: 2  To Pain     Verbal Response: 1T Unable to assess due to Intubation/Tracheostomy     Motor Response: 5- Localizes Stimuli    Pupils: Reactive: Bilateral     Exam: No focal deficits   Head: Normocephalic   Eyes: PERRL   ENT: Tracheostomy:  Tracheostomy in place and secured and bilateral neck incision with JP x2   Hemodynamics: Current Cardiac Rhythm: Normal Sinus Rhythm         Pressor Agents in use: Neosynephrine: Stable dosage         Heart Exam: Rhythm:  regular   Vascular: well perfused with normal pulses in the distal extremities   Pulmonary: Diminished breath sounds: both     Ventilation:  Breathing spontaneously   Abdomen: Nontender and Soft   Nutrition: NPO   Renal/Genitourinary: Foley catheter in place   Musculoskeletal: Left leg boot   Skin: Other  Wounds Left thigh incision site with  On-Q, and left fibula incision site with JP and On-Q   Psych: Not assessed  Labs:  I have personally reviewed recent laboratory values: yes  Significant findings include:     PERTINENT LABS:  No results found for: PHENYTOIN, PHENOBARB, CBMZ, LITHIUM   Lab Results   Component Value Date    NA 138.0 09/27/2020    K 4.2 09/27/2020    CL 104 09/27/2020    CO2CT 24 09/27/2020    BUN 7 (L) 09/27/2020    CREATININE 0.6 09/27/2020    GLUCOSE 129.0 (H) 09/27/2020    CALCIUM 8.4 09/27/2020    AST 21 09/27/2020    ALT 16 09/27/2020    ALKPHOS 74 09/27/2020    PROT 5.5 (L) 09/27/2020     Lab Results   Component Value Date    WBC 8.86 09/27/2020    HGB 13.0 (L) 09/27/2020    HCT 38.4 (L) 09/27/2020    PLT 271 09/27/2020    NEUTROABS 5.89 09/12/2020     No results found for: CHOL, TRIG, HDL, LDLCALC  Lab Results   Component Value Date    TSH 1.10 09/12/2020    HGBA1C 5.0 09/12/2020    VITAMINB12 895 09/12/2020    FOLATE 14.2 09/12/2020     No results found for: GLUCOSEU, PROTEINUA, BILIRUBINUR, UROBILINOGEN, LABPH, BLOODU, KETONESU, NITRITE, LEUKOCYTESUR, LABSPEC, COLORU     Imaging:  I have personally reviewed recent radiology reports: yes  I have personally viewed recent radiology studies: yes  Significant findings include: No results found.                                                  Assessment & Recommendations   There are no hospital problems to display for this patient.    Assessment:  Caleb Robinson is a 69 y.o. year old male admitted to [facility] for hourly flap checks and pressor management s/p oral resection for SCC.     Plan:     Neuro #Acute Pain-   Plan: Multimodal pain control with Gabapentin, Tylenol scheduled and PRN Oxycodone   -Sedation- Precedex drip   -Melatonin QHS   HEENT #Oral SCC s/p resection- hourly flap checks, ASA and Celebrex   Plan: Confirm feeding tube placement, ok for meds overnight   CV  #Hypotension- hold home Amlodipine   #HLD- Crestor restarted   Plan: Wean pressors as tolerated  and Obtain ECG   Central line present? No   Central Line removal planned? Not applicable   Resp  #COPD- Start Duonebs scheduled and PRN  Plan: Chest X-ray, Pulmonary toilet and Wean Oxygen   GI/Nutrition  Plan: Make NPO   -Bowel regimen  -Antiemetics PRN   GU  Plan: IV Fluids:  Initiate Maintenance IV fluids with  Lactated Ringer's Solution and Monitor urine output   Foley present? Yes   Foley removal planned?: No (Indication for continued catheterization:   Need for strict I and O Monitoring)   Endo  Plan: Every 4 hour checks    MSK  Plan: Maintain Left lower leg in boot, On-Q for pain to left leg    Skin  Plan: Local wound care with Peroxide/saline mix and Bacitracin   Heme/Labs  Plan: Follow serial  ABG, BMP and CBC   ID  Plan: Unasyn  Meds  #Depression-   Plan: Remeron restarted    PPx  DVT Prophylaxis ordered? Yes- Lovenox    GI Prophylaxis ordered?: yes- Pepcid    Dispo  Plan: STICU   Other  Plan: N/A     Patient seen and discussed with Dr. Aldona Bar, NP, [place] [facility]  Department of Surgery  Pager: [phone]  Date: 09/27/20  Time: 7:31 PM     Attending:  I have reviewed and discussed with the house staff the clinical course and findings above, as well as laboratory reports, X-ray reports, X-ray films, results of additional medical testing and monitored output as noted above. I personally examined the patient and discussed care plans on rounds with the house staff and health care team.     Patient stable post op so far. Will continue close monitoring in ICU for flap and any potential airway or BP related flap perfusion issues.    Levert Feinstein, MD FACS  Pager [phone]      Electronically signed by Leanord Hawking, MD at 09/27/2020  8:53 PM EDT

## 2020-09-27 NOTE — Unmapped (Signed)
Formatting of this note is different from the original.  Operative Note    Patient:  Caleb Robinson MRN: [id number] Case: [id number]  Date of Birth: [birth year] Age: 69 y.o.  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   - Selective neck dissections level I-IV, bilateral.   - Transosseous mandibular plating.   - Exploration of carotid artery system for microvascular anastomosis, bilateral.   - Open tracheostomy.     Anesthesia: general     Estimated Blood Loss: 400 mL    Urine Output: 1075 mL    Specimens:   ID Type Source Tests Collected by Time Destination   1 : right mental nerve margin Tissue Oral Cavity ROUTINE PATHOLOGY EXAM Jerrye Beavers Day, MD 09/27/2020  8:40 AM    2 : left lingual nerve margin  Tissue Oral Cavity ROUTINE PATHOLOGY EXAM Jerrye Beavers Day, MD 09/27/2020  8:53 AM    3 : left alveolar margin  Tissue Oral Cavity ROUTINE PATHOLOGY EXAM Jerrye Beavers Day, MD 09/27/2020  8:54 AM    4 : left floor of mouth margin  Tissue Oral Cavity ROUTINE PATHOLOGY EXAM Jerrye Beavers Day, MD 09/27/2020  8:55 AM    5 : left ventral tongue  margin  Tissue Oral Cavity ROUTINE PATHOLOGY EXAM Jerrye Beavers Day, MD 09/27/2020  8:55 AM    6 : left lower lip margin  Tissue Oral Cavity ROUTINE PATHOLOGY EXAM Jerrye Beavers Day, MD 09/27/2020  8:56 AM    7 : right lingual nerve margin  Tissue Oral  Cavity ROUTINE PATHOLOGY EXAM Jerrye Beavers Day, MD 09/27/2020  9:06 AM    8 : left dorsal tongue margin Tissue Oral Cavity ROUTINE PATHOLOGY EXAM Jerrye Beavers Day, MD 09/27/2020  9:17 AM    9 : right dorsal tongue margin Tissue Oral Cavity ROUTINE PATHOLOGY EXAM Jerrye Beavers Day, MD 09/27/2020  9:19 AM    10 : right ventral tongue margin Tissue Oral Cavity ROUTINE PATHOLOGY EXAM Jerrye Beavers Day, MD 09/27/2020  9:19 AM    11 : right floor of mouth margin Tissue Oral Cavity ROUTINE PATHOLOGY EXAM Jerrye Beavers Day, MD 09/27/2020  9:19 AM    12 : right alveolar margin Tissue Oral Cavity ROUTINE PATHOLOGY EXAM Jerrye Beavers Day, MD 09/27/2020  9:19 AM    13 : right lower lip margin Tissue Oral Cavity ROUTINE PATHOLOGY EXAM Jerrye Beavers Day, MD 09/27/2020  9:21 AM    14 : right level 1B  Tissue Lymph Node ROUTINE PATHOLOGY EXAM Jerrye Beavers Day, MD 09/27/2020 10:06 AM    15 : left level 1B  Tissue Lymph Node ROUTINE PATHOLOGY EXAM Terrence A. Day, MD 09/27/2020 10:35 AM    16 : level 1A  Tissue Lymph Node ROUTINE PATHOLOGY EXAM Jerrye Beavers Day, MD 09/27/2020 10:36 AM    17 : right mandible margin Tissue Oral Cavity ROUTINE PATHOLOGY EXAM Jerrye Beavers Day, MD 09/27/2020 10:47 AM    18 : left mandible margin Tissue Oral Cavity ROUTINE PATHOLOGY EXAM Jerrye Beavers Day, MD 09/27/2020 10:47 AM    19 : composite oral cavity resection  Tissue Tongue ROUTINE PATHOLOGY EXAM Terrence A. Day, MD 09/27/2020 10:47 AM    20 : left level 2-4 neck dissection Tissue Lymph Node ROUTINE PATHOLOGY EXAM Jerrye Beavers Day, MD 09/27/2020 11:14 AM    21 : right level 2-4 neck dissection  Tissue Lymph Node ROUTINE PATHOLOGY EXAM Jerrye Beavers Day, MD 09/27/2020 12:06 PM      Implants:   Implant Name Type Inv. Item Serial No. Manufacturer Lot No. LRB No. Used Action   PROBE COK [id number] DP-SDP001 DOPPLER IMPLANTABLE - [id number] Generic Implant PROBE COK [id number] DP-SDP001 [facility] IMPLANTABLE  [employer] [id number] Left 1 Implanted   PROBE  COK [id number] DP-SDP001 DOPPLER IMPLANTABLE - [id number] Generic Implant PROBE COK [id number] DP-SDP001 DOPPLER IMPLANTABLE  [employer] [id number] Left 1 Implanted   PLATE STRC [id number] RECON PRIMARY HEMI 2.0MM LEFT - [id number] Plate PLATE STRC [id number] RECON PRIMARY HEMI 2.0MM LEFT  STRYKER CRANIOMAXILLOFACIAL   1 Implanted   SCREW STRC 50-20508 2.0 X 8MM LOCKING CROSS PIN - [id number] Screw SCREW STRC 50-20508 2.0 X 8MM LOCKING CROSS PIN  STRYKER CRANIOMAXILLOFACIAL   10 Implanted   BONE BIO 01-3100 ALLOGENIX 5CC PUTTY - [id number] Generic Implant BONE BIO 01-3100 ALLOGENIX 5CC PUTTY  BIOMET MICROFIXATION   1 Implanted   COUPLER SVS [id number] MICROVASC 3.0 MCA - [id number] Clip COUPLER SVS [id number] MICROVASC 3.0 MCA  [place] MCA [id number] Left 1 Implanted       Drains and Retained Items (Surgical):  Drain/Device Site 09/27/20 1336 Left anterior; lower leg 10 Fr. Terrial Rhodes #1 (Active)   Insertion Site suture(s) intact; no hematoma 10/02/20 2159   Drainage Characteristics/Odor serosanguineous 10/02/20 2159   Drainage Amount moderate 10/02/20 2159   Therapy Setting (Negative Pressure Wound Therapy) bulb suction 10/02/20 2159   General Output (mL) 7.5 10/02/20 2200     Drain/Device Site 09/27/20 1703 Right anterior neck 10 Fr. Terrial Rhodes #2 (Active)   Insertion Site suture(s) intact; no hematoma 10/02/20 2159   Drainage Characteristics/Odor serosanguineous 10/02/20 2159   Drainage Amount small 10/02/20 2159   Therapy Setting (Negative Pressure Wound Therapy) bulb suction 10/02/20 2159   General Output (mL) 5 10/02/20 2200     Drain/Device Site 09/27/20 1704 Left anterior neck 10 Fr. Terrial Rhodes #3 (Active)   Insertion Site suture(s) intact; no hematoma 10/02/20 2159   Drainage Characteristics/Odor sanguineous 10/02/20 2159   Drainage Amount moderate 10/02/20 2159   Therapy Setting (Negative Pressure Wound Therapy) bulb suction 10/02/20 2159   General Output (mL) 30 10/02/20 2200      Naso/Oral Tube 09/27/20 1900 small diameter; nasogastric right nostril (Active)   Securement nasal bridle 10/01/20 2047   Placement Check verified by tube secured (cm) 09/29/20 1400   Tolerance no adverse signs/symptoms 09/30/20 2030   Clamp Status/Tolerance unclamped 10/01/20 2358   Insertion Site Appearance no redness; no tenderness; no skin breakdown; no drainage 09/30/20 2030   Flush/Irrigation flushed with; water; no resistance met 10/02/20 2159   Tubing Change 09/28/20 09/28/20 1000   General Intake (mL) 120 10/03/20 0000   General Output (mL) 0 09/29/20 1500   Tube Feeding Intake (mL)  230 10/03/20 0000     Surgeon Evaluation of Wound Class: II. Clean-contaminated: Resp, GI, GU tracts entered, controlled and No unusual contamination    Complications: None    Indications:  Mr. 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 As such, the risks, benefits, and alternatives of the procedure were discussed with the patient, and they wished to proceed. Consent was obtained.     Findings:   - Large ulcerative tumor bilateral floor of mouth, gingiva, and anterior tongue into whartons duct resected in composite fashion with segmental mandibulectomy, resected with 1cm margins, clear on the first round.      - Prior to making osteotomies, the VSP cutting guides were secured to the mandible to provide customized mandibular reconstruction and profile. Given extensive tumor burden, VSP was adapted to include a more posterior right osteotomy and more anterior left osteotomy. Subsequent transosseous plating of the mandible with appropriate setback with 2.0 Styker mandibular recon bar from right mid-body to left anterior body. Fixated with 3x 91m locking screws on each side.     - Left neck dissection levels I-IV with several suspicious appearing lymph nodes. Identification and preservation of internal jugular vein, sternocleidomastoid muscle, and spinal accessory nerve.     -Right  neck dissection levels I-IV with several suspicious appearing lymph nodes. Identification and preservation of internal jugular vein, sternocleidomastoid muscle, and spinal accessory nerve.     - Exploration of the bilateral external carotid artery system with identification, preservation, and circumferential dissection of external carotid, superior thyroid, common lingual/facial trunk, facial artery, and lingual artery. Circumferential dissection of internal jugular vein, retromandibular vein, facial vein, and other branches. As a microvascular free tissue transfer was planned for reconstruction, the vessels for microvascular anastamosis were then identified and meticulous care was used to circumferentially prepare the jugular vein and carotid artery branches for planned microvascular anastamosis.     Operative Details:  The patient identified in holding, transported to the operating room and placed supine on the operating room table. General anesthesia was induced and the patient orally intubated by the Anesthesia Team without difficulty. A surgical timeout was performed the the patient was prepped and draped in standard fashions.     The case commenced with a tracheotomy. A skin incision was made through the skin and subcutaneous tissue. The strap muscles were identified in the midline raphe and retracted laterally to expose the thyroid isthmus. The thyroid isthmus was divided with careful hemostasis allowing u[place] enter into the pretracheal plane.  A horizontal tracheotomy incision was made between rings # 2 and 3. A silk suture was placed around the inferior tracheal ring and brought out to the skin. A 7-0 reinforced endotracheal tube was introduced into the tracheotomy site and connected to the anesthesia circuit. Proper placement of the endotracheal tube was confirmed with end-tidal CO2.  The endotracheal tube was secured in place to the patient's chest with two 2-0 silk sutures.     We commenced the level 1A  and level 1B neck dissections next. We made the incision through the neck skin and subcutaneous tissue down to the level of the platysma. Subplatysmal flaps were elevated superiorly to the inferior border of the mandible and inferiorly past the omohyoid muscle.  Care was taken not to communicate the neck with the tracheotomy site.  The external jugular vein and great auricular nerve was identified and preserved. The inferior border of the submandibular gland was identified  and a fascial flap of submandibular gland fascia was elevated in a plane superior to the inferior border of the mandible to protect the marginal mandibular nerve. Level 1A was removed first.  Starting at the apex of 1A at the mentum, we outlined the limits of the dissection bounded by the anterior belly of the digastric muscle laterally, the mylohyoid deep, and the hyoid bone inferiorly. All of the fibrofatty tissue within this region was removed and sent as neck dissection level 1A.  We next performed the left neck dissection level 1B.  The anterior belly of the digastric muscle was traced inferolaterally to its tendon.  The common facial vein was identified and traced distally to the submandibular gland.   The posterior belly of the digastric was traced posterior to the mastoid tip.  We started removing the fibrofatty packet anterosuperiorly at the inferior border of the mandible and peeled it off the of the underlying mylohyoid muscle.  The submental vessels and neurovascular pedicle to the mylohyoid were identified, ligated, and divided.  The posterior edge of the mylohyoid was identified and the mylohyoid muscle retracted anteriorly to expose the deep submandibular space. The submandibular duct was identified and ligated. The lingual nerve was identified, ligated and sent for frozen section. The hypoglossal nerve was identified one fascial plane deeper and preserved.  The proximal facial artery was identified and traced distally through the  submandibular gland, ligating the facial nerves branches as needed so that the artery could be left flowing for the reconstructive team.  The facial vein was similarly traced through level 1B and left flowing for the reconstruction.. Working from anterior to posterior along the plane of the inferior border of the mandible, the rest of the fibrofatty packet was removed.  The fibrofatty packet was sent as left neck dissection level 1B.     The same procedure was then performed on the right side in similar fashion to complete the right Level 1B neck dissection.     We turned our attention intraorally for the extirpation of the primary tumor. The tumor was inspected and palpated. It was noted to be involving the bilateral floor of mouth, gingiva, and anterior tongue into whartons duct bilaterally and into the gingiva .  1cm margins were mapped with the Bovie circumferentially around the tumor.  Circumferential cuts were made around the tumor and margins encompassing the entirety of the mucosal portion of the specimen were sent for frozen section analysis.  These all returned with no evidence of malignancy.   We then worked our way onto the lateral aspect of the mandible from the oral cavity side, taking a healthy cuff of tissue. Working from the oral cavity allowed [place] to get onto the mandible in an oncologically sound fashion on its lateral aspect.   From the neck, we got onto the inferior border of the mandible and released the periosteum from the right body to the left body. We connected the intra-oral incisions to the neck lateral to the mandible.  The periosteum was elevated from the lateral cortex with a molt 9 and we mapped out the areas of gross mandible destruction by the tumor which extended from the right mid-body to the left anterior body which was slightly different from the VSP. The VSP was fixated and adapted as such to incorporate the changes in tumor burden as described in the findings.  The right mental  nerve was involved in tumor, clipped, divided, and sent for frozen section.  The margin returned benign.  The left mental nerve was freed and preserved. We now planned the sites of our osteotomies. We made the left osteotomy just posterior to the planned VSP cut followed by the osteotomy on the right. We then completed the deep floor of mouth resection through the genioglossus, geniohyoid, and mylohyoid muscles laterally. Posteriorly we then completed the anterior glossectomy taking a healthy cuff of normal appearing tissue. The bilateral deep hypoglossal nerves were identified and preserved. This completed the composite oral cavity resection consisting of the  segmental mandibulectomy, bilateral floor of mouth, bilateral ventral tongue, and bilateral labiobuccal mucosa.  The specimen was inspected and the margins appeared adequate.  It was sent for permanent pathologic analysis.  Bone marrow margins were sent and returned negative.      We turned our attention to the transosseous plating. A 2.38m Stryker locking reconstruction plate was selected and adapted to the native mandible from the right body to the left body, contoured to be appropriate for the planned 2-segment fibula reconstruction and secured with three  2.0 mm locking screws on each side of the planned composite resection.     We performed the left neck dissection level II-IV next. The fascia was unwrapped off of the SCM from mastoid tip superiorly to just inferior to the omohyoid inferiorly.  Cranial nerve XI was identified and traced superiorly and it was found to course lateral to the internal jugular vein.  The limits of the neck dissection were identified.  They consisted of the skull base superiorly, the posterior border of the SCM posteriorly, the cervical rootlets overlying the deep layer of deep cervical fascia as the deep border, the clavicle inferiorly, and the lateral aspect of the strap muscles anteriorly.  All of the fibrofatty tissue  encompassed by these boundaries was removed.  The internal jugular vein, common facial vein, external jugular vein, carotid artery, hypoglossal nerve, spinal accessory nerve, and vagus nerve were identified and preserved. No overtly suspicious nodes were appreciated, although there were a few abnormal appearing lymph nodes.   The wound was copiously irrigated.  No fluid suspicious for chyle was noted.  Hemostasis was obtained with bipolar cautery. A valsalva was performed. No additional bleeding was noted.    The same procedure was then performed on the left side in similar fashion to complete the left level II-IV neck dissection.  The planned incision was marked out with 4cm incision made using a #15 blade. Subplatysmal flaps were raised while preserving the greater auricular nerve and external jugular vein.   Sternocleidomastoid was unzipped from its fascia using cautery and blunt dissection.  Once CN XI was identified, it was traced toward the digastric and its entry into the SCM.  It was preserved throughout the procedure.    The facsial flap was elevated over level 1B to preserve marginal mandibular nerve. We then proceeded with dissection of levels II-III. SCM was unzipped from fascia, retracted laterally. IJ was identified superiorly up to the level of the posterior belly of digastric. CN XI was further dissected in level II and circumferentially skeletonized.. It was then retracted to fully expose level IIa and IIB. IIB contents were then dissected off the floor of the neck in a superior to inferior fashion. Once IIB was adequately elevated, contents were passed underneath CN XI. The entire cervical plexus branches were identified and preserved separating level five from levels two thru four.  Dissection was continued inferiorly along SCM until the lateral border of the muscle was reached. We carried our dissection  superiorly up to levels III and IV. Dissection was carried forward along the rootlets until  the specimen was pedicled on the IJ. A fresh #15 blade was used to free the contents off of IJ preserving vagus nerve, carotid artery and sympathetic trunk, and excision of levels II-IV was completed. The neck and primary site was then copiously irrigated.     As a microvascular free tissue transfer was planned for reconstruction, the vessels for microvascular anastamosis were then identified and meticulous care was used to circumferentially prepare the jugular vein and carotid artery branches for planned microvascular anastamosis.     At this point, the patient was turned over to the care of the reconstructive team. Please note that Dr. Elana Alm was present and scrubbed for all critical parts of procedure. There were no complications.     We then commenced the exploration of the carotid artery system on the left. Using a Production manager, the facial artery was traced proximally to the external carotid artery and freed circumferentially. The posterior belly of the digastric was divided to increase mobility of the facial artery. The facial artery originated from a common lingual-facial trunk. The lingual artery was identified, traced, and freed circumferentially.  The superior thyroid artery was identified, traced, and freed circumferentially.    We then commenced the exploration of the carotid artery system on the right. Using a Production manager, the facial artery was traced proximally to the external carotid artery and freed circumferentially. The posterior belly of the digastric was divided to increase mobility of the facial artery. The facial artery originated from a common lingual-facial trunk. The lingual artery was identified, traced, and freed circumferentially.  The superior thyroid artery was identified, traced, and freed circumferentially.    This marked the conclusion of the ablative portion of the procedure.  The patient was turned over to Dr. Toula Moos reconstruction team for the planned fibla free flap  reconstruction.      Expected Return to OR: Yes. Reason for return:  reconstruction    Attestations: Dr. Elana Alm and/or Dr. Nolberto Hanlon and/or Dr. Benard Rink was present for all key portions of the procedure and immediately available for the entire procedure.     Signed:  Otelia Limes. Ronne Binning, MD  Resident Physician, PGY-5  Otolaryngology - Head & Neck Surgery      Electronically signed by Junie Panning, MD at 10/03/2020 10:56 AM EDT

## 2020-09-27 NOTE — Progress Notes (Signed)
Formatting of this note might be different from the original.  ENT Flap Check Note    Flap examined at bedside    Color: Pink  Capillary refill: <3 seconds  Turgor: Good with no evidence of edema or congestion  Arterial Signal: Strong, pulsatile  Venous Signal: Continuous    Will continue q1hr nursing and q4hr MD flap checks through 10/22 @ [facility]. Cordie Grice, MD  Resident Physician  ENT - Head & Neck Surgery  09/27/20 8:50 PM    Electronically signed by Carolann Littler Cordie Grice, MD at 09/27/2020  8:52 PM EDT

## 2020-09-27 NOTE — Interval H&P Note (Signed)
Formatting of this note might be different from the original.  The H&P was reviewed, the patient was re-examined and there are no significant clinical changes.      Electronically signed by Junie Panning, MD at 09/27/2020  8:29 AM EDT    Source Note - Jerrye Beavers Day, MD - 08/28/2020 10:40 AM EDT  Formatting of this note is different from the original.  Images from the original note were not included.  [facility]  [address] [place] [address]    Phone: [phone]  FAX: [phone]  [email or web address]  Email: [email or web address]      Lonzo [place], [place] [address]     Dear Dr. Elby Beck,    I had the pleasure of seeing Mr. [name] for f/u today with a chief complaint of oral cavity cancer.  His oncologic history is described below.     Mr. 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 Symptoms started approximately 3-4 months ago as some mild tenderness underneath his tongue, which he initially attributed to his dentures. Then approximately 2 months ago began to have an enlarging left tender neck mass. Over the past month his endorses more tongue restriction of motion, and more tenderness. Also has started to have numbness of his tongue (both sides), and the front of his lower teeth. Had a CT 5/26, which was remarkable for R lvl II 1.5cm node per OSH read. Also had a PET remarkable for hypermetabolic FOM lesion, cervical nodes, and nodules in the lungs. Denies dysphagia, odynophagia, weight loss. Of note, patient states that "you can take anything off of me that you need to, but leave me my tongue and my hair"     Has no prior history of radiation or prior cancers.     ONCOLOGIC HISTORY  Site: FOM/ventral tongue     Pathology: p16 negative SCCa   TNM Stage: likely cT4aN1-N2cMx  Surgery/dates: n/a  Chemo/radiation: n/a    Tobacco use: 33 pack year smoking history. Currently at around 1 pack per day as of 07/2020  Alcohol Use: The patient reports no history of alcohol use.     No past medical history on file.   No past surgical history on file.     Social History:   Social History     Socioeconomic History   ? Marital status: Unknown     Spouse name: None   ? Number of children: None   ? Years of education: None   ? Highest education level: None   Occupational History   ? None   Tobacco Use   ? Smoking status: Current Every Day Smoker     Packs/day: 0.25     Types: Cigarettes   ? Smokeless tobacco: Never Used   Substance and Sexual Activity   ? Alcohol use: Never   ? Drug use: Never   ? Sexual activity: None   Other Topics Concern   ? None   Social History Narrative   ? None     Social Determinants of Health     Financial Resource Strain:    ? Difficulty of Paying Living Expenses: Not on file   Food Insecurity:    ?  Worried About Charity fundraiser in the Last Year: Not on file   ? Ran Out of Food in the Last Year: Not on file   Transportation Needs:    ? Lack of Transportation (Medical): Not on file   ? Lack of Transportation (Non-Medical): Not on file   Physical Activity:    ? Days of Exercise per Week: Not on file   ? Minutes of Exercise per Session: Not on file   Stress:    ? Feeling of Stress : Not on file   Social Connections:    ? Frequency of Communication with Friends and Family: Not on file   ? Frequency of Social Gatherings with Friends and Family: Not on file   ? Attends Religious Services: Not on file   ? Active Member of Clubs or Organizations: Not on file   ? Attends Archivist Meetings: Not on file   ? Marital Status: Not on file   Intimate Partner Violence:    ? Fear of Current or Ex-Partner: Not on file   ? Emotionally Abused: Not on file   ? Physically Abused: Not on file   ? Sexually Abused: Not on  file   Housing Stability:    ? Unable to Pay for Housing in the Last Year: Not on file   ? Number of Places Lived in the Last Year: Not on file   ? Unstable Housing in the Last Year: Not on file     Family History - The family history is not on file.     Current Outpatient Medications   Medication Sig Dispense Refill   ? albuterol 0.083 % (Proventil) 2.5 mg /3 mL (0.083 %) nebulizer solution Inhale 2.5 mg into the lungs.     ? albuterol 90 mcg/actuation inhaler Inhale 2 puffs into the lungs.     ? albuterol 90 mcg/actuation inhaler      ? amLODIPine (NORVASC) 5 mg tablet      ? ascorbic acid (vitamin C) (vitamin C) 100 mg tablet Take 100 mg by mouth daily.     ? aspirin 81 mg delayed release enteric coated tablet Take 81 mg by mouth.     ? chlorhexidine gluconate (PERIDEX) 0.12 % mouthwash      ? cholecalciferol (vitamin D3) 25 mcg (1,000 unit) capsule Take by mouth.     ? ferrous sulfate 325 mg (65 mg iron) tablet Take by mouth.     ? fluticasone-umeclidinium-vilanterol (Trelegy Ellipta) 100-62.5-25 mcg/dose powder inhaler Inhale 2 puffs into the lungs.     ? gabapentin (NEURONTIN) 100 mg capsule every evening.      ? ibuprofen (MOTRIN) 800 mg tablet      ? indomethacin (INDOCIN) 25 mg capsule Take 25 mg by mouth.     ? lidocaine 2 % solution      ? melatonin 10 mg tablet Take by mouth.     ? mirtazapine (REMERON) 15 mg tablet      ? multivitamin tablet Take 1 tablet by mouth.     ? pantoprazole (PROTONIX) 40 mg delayed release tablet Take 40 mg by mouth.     ? potassium 99 mg Tablet Take 99 mg by mouth.     ? rosuvastatin (CRESTOR) 10 mg tablet      ? zinc gluconate 50 mg tablet Take 50 mg by mouth daily.     ? amoxicillin-clavulanate (AUGMENTIN) 875-125 mg tablet  (Patient not taking: Reported on 08/28/2020)     ?  clindamycin (CLEOCIN) 300 mg capsule  (Patient not taking: Reported on 08/28/2020)     ? methylPREDNISolone (Medrol Dosepack) 4 mg tablet dose pack  (Patient not taking: Reported on 08/28/2020)       No  current facility-administered medications for this visit.     Review of patient's allergies indicates:  Not on File     Review of Systems: ROS is otherwise negative for visual and eye problems, cardiovascular history, shortness of breath, urinary problems, gastrointestinal problems, muscle and joint problems, skin diseases, neurological problems, psychological problems, thyroid or endocrine problems, lymphatic and hematologic problems, allergies, easy bruising/bleeding, and aspirin use.    Physical Examination  Vitals: BP 135/61   Pulse 69   Temp 36.4 C (97.6 F) (Temporal)   Resp 16   Ht 162.6 cm (5\' 4" )   Wt 45.9 kg (101 lb 4.8 oz)   SpO2 97%   BMI 17.39 kg/m   General: Examination reveals a well-developed, well-nourished patient in no apparent distress.  The patient has no audible dysphonia, stridor or airway distress.  The patient is oriented, alert and responsive.    Skin: There are no suspicious cutaneous lesions noted on the skin of the head and neck.     Otologic: The pinnae are normal in appearance without lesions.    Eyes: The conjunctiva are normal and there is no proptosis.  Extraocular motility is intact and symmetric.  Nose: The external nose reveals no masses or asymmetry.     Oral Cavity: The lips are without lesions or masses.  No trismus. Very limited protrusion of tongue, limited mobility. Ventral tongue and floor of mouth bilaterally is involved by ulcerative tumor fixed to the mandible limiting movement. Firmness appreciated throughout floor of mouth bilaterally at least to gingiva and periosteum of mandible. Sensation intact bilaterally tongue  Oropharynx: There are no mucosal abnormalities noted within the oropharynx on intraoral exam.  The soft palate elevates symmetrically, the tonsils are normal to inspection.       Hypopharynx / Larynx:  See Procedure  Salivary glands: There are no palpable masses of the parotid or submandibular glands.   Neuro: Facial and trigeminal nerve  function is intact and symmetric.  Facial sensation to touch is intact and symmetric.  Cranial nerves 2-12 are without obvious abnormality.  Neck: The neck is soft and symmetric.  There is palpable adenopathy in level Ia, and right level Ib. There is normal laryngeal crepitus and the trachea is midline.    Thyroid: There are no palpable masses in the paratracheal or thyroid region.  The thyroid is normal to palpation.   Respiratory: Breathing non-labored.  Extremities: Active ROM x 4, strength 5/5.    Imaging:    ASSESSMENT:    1.   likely cT4aN1-N2cMx p16 negative SCCa of FOM     PLAN:  I have again discussed options related to further work-up and treatment. Imaging reviewed with patient.  Previously, he did not want any surgery on his tongue or any treatment that would result in hair loss.  Now he feels the tumor is growing.  We will make sure he has follow up with med onc team as well to discuss non-surgical options, he does understand that surgery is the recommended curative treatment option but does not want to pursue surgery at this time. He is reconsidering surgery based upon this discussion and if he agrees to surgery, I have again discussed treatment including surgery and nonsurgical options and have recommended composite resection with possible  mandibulectomy, partial glossectomy, neck dissection, tracheotomy, feeding tube, transosseous plating, microvascular vessel preparation and free tissue transfer reconstruction.  This will require ICU stay, at least a week in the hospital and changes in speech, swallowing, chewing, occlusion, and cosmesis.     We will discuss this patient?s case at our weekly Head and Neck Tumor Conference. A Head and Neck Nurse Specialist will advise the patient of the recommendations.  We will also schedule an appt with my colleagues in Maxillofacial Prosthodontics, Medical Oncology, Radiation Oncology, Reconstructive Surgery, and Speech Pathology. The patient seems to understand  all risks, benefits, alternatives and wishes to proceed as planned.     Sincerely,     Dr. Coralyn Mark Day, MD/ Haynes Kerns, ACNP-BC  Head and Neck Surgical Oncology    CC:  Patient Care Team:  Lonzo Cloud, DO as PCP - General (Otolaryngology)    Electronically signed by Junie Panning, MD at 09/26/2020 12:30 PM EDT

## 2020-09-27 NOTE — Unmapped (Signed)
Formatting of this note is different from the original.    Brief Operative Note    Patient:  Caleb Robinson MRN: [id number] Case: [id number]  Date of Birth: July 29, 1951 Age: 69 y.o.  Sex: male     Date: 09/27/2020    Pre-Op Diagnosis:  Cancer of oral cavity [C06.9]    Post-Op Diagnosis:  Post-Op Diagnosis Codes:     * Cancer of oral cavity [C06.9]     Providers/Role: Surgeon(s) and Role:  Panel 1:     * Jerrye Beavers Day, MD - Primary     * Rexene Edison, MD - Fellow  Panel 2:     * Roney Marion, MD - Primary   Circulator: Erin Fulling, RN  Relief Circulator: Isaias Cowman [place]  Scrub Person: Lucia Gaskins, LPN; Lenor Derrick  Resident: Sharol Harness, MD    Procedure:   Procedure:    COMPOSITE GLOSSECTOMY WITH RADICAL NECK DISSECTION  CPT(R) Code:  02409 - PR EXCIS TONGUE,MOUTH,JAW,RAD NECK  Second CPT(R) Code:  73532 - PR EXCIS [place]  Associated Diagnoses:     * Cancer of oral cavity    Procedure:    RESECTION, RADICAL, NEOPLASM CHEST WALL 5 CM OR GREATER  CPT(R) Code:  99242 - PR RAD RESECT TUMOR SOFT TISS NECK/ANT THORAX 5+CM  Second CPT(R) Code:  68341 - PR RECONSTR MANDIBLE,BONE PLATE    Procedure:    PERCUTANEOUS TRACHEOSTOMY  CPT(R) Code:  96222 - PR TRACHEOSTOMY, PLANNED    Procedure:    FREE FLAP, OSTEOCUTANEOUS/ NOT ILIAC, METATARSAL OR GREAT TOE  CPT(R) Code:  20969 - PR BONE-SKIN GRAFT, MICROVASCULAR    Procedure:    ADJACENT TISSUE TRANSFER OR REARRANGEMENT, ANY AREA; DEFECT 30.1 SQ CM TO 60.0 SQ CM  CPT(R) Code:  97989 - PR ADJ TISS XFER ANY AREA,30.1-60 SQCM    Procedure:    ADJACENT TISSUE TRANSFER OR REARRANGEMENT, ANY AREA, EACH ADDITIONAL 30.0 SQ CM, OR PART THEREOF  CPT(R) Code:  21194 - PR ADJ TISS XFER ANY AREA,EA ADD 30.0 SQCM    Procedure:    RECONSTRUCTION MANDIBULAR, EXTRAORAL, WITH TRANSOSTEAL BONE PLATE (EG MANDIBULAR STAPLE BONE PLATE  CPT(R) Code:  17408 - PR RECONSTR MANDIBLE,BONE PLATE    Anesthesia: general     Estimated Blood Loss: 200 mL    Urine  Output: 350 mL    Specimens:   ID Type Source Tests Collected by Time Destination   1 : right mental nerve margin Tissue Oral Cavity ROUTINE PATHOLOGY EXAM Jerrye Beavers Day, MD 09/27/2020  8:40 AM    2 : left lingual nerve margin  Tissue Oral Cavity ROUTINE PATHOLOGY EXAM Jerrye Beavers Day, MD 09/27/2020  8:53 AM    3 : left alveolar margin  Tissue Oral Cavity ROUTINE PATHOLOGY EXAM Jerrye Beavers Day, MD 09/27/2020  8:54 AM    4 : left floor of mouth margin  Tissue Oral Cavity ROUTINE PATHOLOGY EXAM Jerrye Beavers Day, MD 09/27/2020  8:55 AM    5 : left ventral tongue  margin  Tissue Oral Cavity ROUTINE PATHOLOGY EXAM Jerrye Beavers Day, MD 09/27/2020  8:55 AM    6 : left lower lip margin  Tissue Oral Cavity ROUTINE PATHOLOGY EXAM Jerrye Beavers Day, MD 09/27/2020  8:56 AM    7 : right lingual nerve margin  Tissue Oral Cavity ROUTINE PATHOLOGY EXAM Jerrye Beavers Day, MD 09/27/2020  9:06 AM    8 : left dorsal tongue margin Tissue Oral Cavity ROUTINE PATHOLOGY EXAM  Jerrye Beavers Day, MD 09/27/2020  9:17 AM    9 : right dorsal tongue margin Tissue Oral Cavity ROUTINE PATHOLOGY EXAM Jerrye Beavers Day, MD 09/27/2020  9:19 AM    10 : right ventral tongue margin Tissue Oral Cavity ROUTINE PATHOLOGY EXAM Jerrye Beavers Day, MD 09/27/2020  9:19 AM    11 : right floor of mouth margin Tissue Oral Cavity ROUTINE PATHOLOGY EXAM Jerrye Beavers Day, MD 09/27/2020  9:19 AM    12 : right alveolar margin Tissue Oral Cavity ROUTINE PATHOLOGY EXAM Jerrye Beavers Day, MD 09/27/2020  9:19 AM    13 : right lower lip margin Tissue Oral Cavity ROUTINE PATHOLOGY EXAM Jerrye Beavers Day, MD 09/27/2020  9:21 AM    14 : right level 1B  Tissue Neck ROUTINE PATHOLOGY EXAM Jerrye Beavers Day, MD 09/27/2020 10:06 AM    15 : left level 1B  Tissue Neck ROUTINE PATHOLOGY EXAM Jerrye Beavers Day, MD 09/27/2020 10:35 AM    16 : level 1A  Tissue Neck ROUTINE PATHOLOGY EXAM Jerrye Beavers Day, MD 09/27/2020 10:36 AM    17 : right mandible margin Tissue Oral Cavity ROUTINE PATHOLOGY EXAM Jerrye Beavers Day, MD 09/27/2020 10:47 AM    18 : left mandible margin Tissue Oral Cavity ROUTINE PATHOLOGY EXAM Jerrye Beavers Day, MD 09/27/2020 10:47 AM    19 : composite oral cavity resection  Tissue Oral Cavity ROUTINE PATHOLOGY EXAM Terrence A. Day, MD 09/27/2020 10:47 AM    20 : left level 2-4 neck dissection Tissue Oral Cavity ROUTINE PATHOLOGY EXAM Jerrye Beavers Day, MD 09/27/2020 11:14 AM    21 : right level 2-4 neck dissection  Tissue Neck ROUTINE PATHOLOGY EXAM Terrence A. Day, MD 09/27/2020 12:06 PM      Implants:   * No implants in log *     Drains and Retained Items (Surgical):  * No LDAs found *    Surgeon Evaluation of Wound Class: II. Clean-contaminated: Resp, GI, GU tracts entered, controlled and No unusual contamination    Complications: None    Findings: Large tumor of left floor of mouth and tongue into whartons duct resected in composite fashion with left neck dissection.    Nodular tumor onto gingiva and right floor of mouth resected in composite fashion with right neck dissection.    Midline, right and left mandible resected with clear bone margins.     Margins clear.    Prior to making osteotomies, the VSP cutting guides were secured to the mandible to provide customized mandibular reconstruction and profile.   VSP guiding software was used to plan the bone cuts, planned osteotomies, plate and screw hole location for optimal function, cosmetic and future occlusal outcomes.     As a microvascular free tissue transfer was planned for reconstruction, the vessels for microvascular anastamosis were then identified and meticulous care was used to circumferentially prepare the jugular vein and carotid artery branches for planned microvascular anastamosis.     Expected Return to OR: Yes. Reason for return:  Reconstruction    Attestations:I was present for the key portions of the procedure and either I or Dr. Nolberto Hanlon or Aylward was immediately available for the entire procedure.    Signed:  Terrence A.  Day  09/27/2020  12:24 PM  Electronically signed by Jerrye Beavers Day, MD at 09/27/2020 12:28 PM EDT

## 2020-09-27 NOTE — Unmapped (Signed)
Formatting of this note is different from the original.    Brief Operative Note    Patient:  Caleb Robinson MRN: [id number] Case: [id number]  Date of Birth: October 23, 1951 Age: 69 y.o.  Sex: male     Date: 09/27/2020    Pre-Op Diagnosis:  Cancer of oral cavity [C06.9]    Post-Op Diagnosis:  Post-Op Diagnosis Codes:     * Cancer of oral cavity [C06.9]     Providers/Role: Surgeon(s) and Role:  Panel 1:     * Jerrye Beavers Day, MD - Primary     * Rexene Edison, MD - Fellow  Panel 2:     * Roney Marion, MD - Primary   Circulator: Erin Fulling, RN  Physician Assistant: Mickeal Skinner  Relief Circulator: Isaias Cowman [place]; Sharion Balloon, RN  Scrub Person: Lucia Gaskins, LPN; Lenor Derrick  Resident: Sharol Harness, MD    Procedure:   Procedure:    COMPOSITE GLOSSECTOMY WITH RADICAL NECK DISSECTION  CPT(R) Code:  29937 - PR EXCIS TONGUE,MOUTH,JAW,RAD NECK  Second CPT(R) Code:  16967 - PR EXCIS [place]  Associated Diagnoses:     * Cancer of oral cavity    Procedure:    RESECTION, RADICAL, NEOPLASM CHEST WALL 5 CM OR GREATER  CPT(R) Code:  89381 - PR RAD RESECT TUMOR SOFT TISS NECK/ANT THORAX 5+CM  Second CPT(R) Code:  01751 - PR RECONSTR MANDIBLE,BONE PLATE    Procedure:    PERCUTANEOUS TRACHEOSTOMY  CPT(R) Code:  02585 - PR TRACHEOSTOMY, PLANNED    Procedure:    FREE FLAP, OSTEOCUTANEOUS/ NOT ILIAC, METATARSAL OR GREAT TOE  CPT(R) Code:  20969 - PR BONE-SKIN GRAFT, MICROVASCULAR  Associated Diagnoses:     * Cancer of oral cavity    Procedure:    SPLIT-THICKNESS AUTOGRAFT, TRUNK, ARMS, LEGS; FIRST 100 SQ CM OR LESS, OR 1% BODY AREA INFANTS/CHILDREN  CPT(R) Code:  15100 - PR SPLIT GRFT TRUNK,ARM,LEG <100 SQCM    Procedure:    VESTIBULOPLASTY; COMPLEX (INCLUDING RIDGE EXTENSION, MUSCLE REPOSITIONING)  CPT(R) Code:  27782 - PR RECONSTRUC MOUTH COMPLEX    Procedure:    HYOID MYOTOMY AND SUSPENSION  CPT(R) Code:  42353 - PR HYOID MYOTOMY & SUSPENSION    Procedure:    EXPLORATION W/WO LYSIS OF  ARTERY; CAROTID ARTERY  CPT(R) Code:  61443 - PR EXPLORATION N/FLWD SURG NECK ARTERY    Anesthesia: general     Estimated Blood Loss: 400 mL    Urine Output: 1075 mL    Specimens:   ID Type Source Tests Collected by Time Destination   1 : right mental nerve margin Tissue Oral Cavity ROUTINE PATHOLOGY EXAM Jerrye Beavers Day, MD 09/27/2020  8:40 AM    2 : left lingual nerve margin  Tissue Oral Cavity ROUTINE PATHOLOGY EXAM Jerrye Beavers Day, MD 09/27/2020  8:53 AM    3 : left alveolar margin  Tissue Oral Cavity ROUTINE PATHOLOGY EXAM Jerrye Beavers Day, MD 09/27/2020  8:54 AM    4 : left floor of mouth margin  Tissue Oral Cavity ROUTINE PATHOLOGY EXAM Jerrye Beavers Day, MD 09/27/2020  8:55 AM    5 : left ventral tongue  margin  Tissue Oral Cavity ROUTINE PATHOLOGY EXAM Jerrye Beavers Day, MD 09/27/2020  8:55 AM    6 : left lower lip margin  Tissue Oral Cavity ROUTINE PATHOLOGY EXAM Jerrye Beavers Day, MD 09/27/2020  8:56 AM    7 : right lingual nerve margin  Tissue  Oral Cavity ROUTINE PATHOLOGY EXAM Jerrye Beavers Day, MD 09/27/2020  9:06 AM    8 : left dorsal tongue margin Tissue Oral Cavity ROUTINE PATHOLOGY EXAM Jerrye Beavers Day, MD 09/27/2020  9:17 AM    9 : right dorsal tongue margin Tissue Oral Cavity ROUTINE PATHOLOGY EXAM Jerrye Beavers Day, MD 09/27/2020  9:19 AM    10 : right ventral tongue margin Tissue Oral Cavity ROUTINE PATHOLOGY EXAM Jerrye Beavers Day, MD 09/27/2020  9:19 AM    11 : right floor of mouth margin Tissue Oral Cavity ROUTINE PATHOLOGY EXAM Jerrye Beavers Day, MD 09/27/2020  9:19 AM    12 : right alveolar margin Tissue Oral Cavity ROUTINE PATHOLOGY EXAM Jerrye Beavers Day, MD 09/27/2020  9:19 AM    13 : right lower lip margin Tissue Oral Cavity ROUTINE PATHOLOGY EXAM Jerrye Beavers Day, MD 09/27/2020  9:21 AM    14 : right level 1B  Tissue Neck ROUTINE PATHOLOGY EXAM Jerrye Beavers Day, MD 09/27/2020 10:06 AM    15 : left level 1B  Tissue Neck ROUTINE PATHOLOGY EXAM Jerrye Beavers Day, MD 09/27/2020 10:35 AM    16 : level 1A   Tissue Neck ROUTINE PATHOLOGY EXAM Jerrye Beavers Day, MD 09/27/2020 10:36 AM    17 : right mandible margin Tissue Oral Cavity ROUTINE PATHOLOGY EXAM Jerrye Beavers Day, MD 09/27/2020 10:47 AM    18 : left mandible margin Tissue Oral Cavity ROUTINE PATHOLOGY EXAM Jerrye Beavers Day, MD 09/27/2020 10:47 AM    19 : composite oral cavity resection  Tissue Oral Cavity ROUTINE PATHOLOGY EXAM Terrence A. Day, MD 09/27/2020 10:47 AM    20 : left level 2-4 neck dissection Tissue Oral Cavity ROUTINE PATHOLOGY EXAM Jerrye Beavers Day, MD 09/27/2020 11:14 AM    21 : right level 2-4 neck dissection  Tissue Neck ROUTINE PATHOLOGY EXAM Jerrye Beavers Day, MD 09/27/2020 12:06 PM      Implants:   Implant Name Type Inv. Item Serial No. Manufacturer Lot No. LRB No. Used Action   PROBE COK [id number] DP-SDP001 DOPPLER IMPLANTABLE - [id number] Generic Implant PROBE COK [id number] DP-SDP001 [facility] IMPLANTABLE  [employer] [id number] Left 1 Implanted   PROBE COK [id number] DP-SDP001 DOPPLER IMPLANTABLE - [id number] Generic Implant PROBE COK [id number] DP-SDP001 DOPPLER IMPLANTABLE  [employer] [id number] Left 1 Implanted   PLATE STRC 50-09381 RECON PRIMARY HEMI 2.0MM LEFT - [id number] Plate PLATE STRC 78-93810 RECON PRIMARY HEMI 2.0MM LEFT  STRYKER CRANIOMAXILLOFACIAL   1 Implanted   SCREW STRC 50-20508 2.0 X 8MM LOCKING CROSS PIN - [id number] Screw SCREW STRC 50-20508 2.0 X 8MM LOCKING CROSS PIN  STRYKER CRANIOMAXILLOFACIAL   10 Implanted   BONE BIO 01-3100 ALLOGENIX 5CC PUTTY - [id number] Generic Implant BONE BIO 01-3100 ALLOGENIX 5CC PUTTY  BIOMET MICROFIXATION   1 Implanted   COUPLER SVS [id number] MICROVASC 3.0 MCA - [id number] Clip COUPLER SVS [id number] MICROVASC 3.0 MCA  [employer] MCA [id number] Left 1 Implanted       Drains and Retained Items (Surgical):  Drain/Device Site 09/27/20 1336 Left anterior; lower leg 10 Fr. Terrial Rhodes #1 (Active)     Surgeon Evaluation of Wound Class: II. Clean-contaminated: Resp,  GI, GU tracts entered, controlled and No unusual contamination    Complications: None    Findings: see OR note    Expected Return to OR: No    Attestations:I was present for key portions of the procedure and immediately available for the entire procedure.    Signed:  Estrellita Ludwig. Hornig  09/27/2020  4:58 PM  Electronically signed by Roney Marion, MD at 09/27/2020  4:58 PM EDT

## 2020-09-28 NOTE — Unmapped (Signed)
Formatting of this note might be different from the original.  Operative Note    Patient:  Caleb Robinson  MRN: [id number]  Case: [id number]  Date of Birth: Sep 09, 1951  Date: 09/27/2020    Pre-Op Diagnosis:  1. Oral cavity cancer    Post-Op Diagnosis:  1. Oral cavity Cancer  2. Cutaneous defect of left leg, 4x8 cm    Surgeon: Marinell Blight, MD    Assistant: Lorenza Cambridge, MD    Procedure:   Procedure:    FREE FLAP, OSTEOCUTANEOUS/ NOT ILIAC, METATARSAL OR GREAT TOE  CPT(R) Code:  20969 - PR BONE-SKIN GRAFT, MICROVASCULAR  Associated Diagnoses:     * Cancer of oral cavity    Procedure:    SPLIT-THICKNESS AUTOGRAFT, TRUNK, ARMS, LEGS; FIRST 100 SQ CM OR LESS, OR 1% BODY AREA INFANTS/CHILDREN  CPT(R) Code:  15100 - PR SPLIT GRFT TRUNK,ARM,LEG <100 SQCM    Procedure:    VESTIBULOPLASTY; COMPLEX (INCLUDING RIDGE EXTENSION, MUSCLE REPOSITIONING)  CPT(R) Code:  42595 - PR RECONSTRUC MOUTH COMPLEX    Procedure:    HYOID MYOTOMY AND SUSPENSION  CPT(R) Code:  63875 - PR HYOID MYOTOMY & SUSPENSION    Procedure:    EXPLORATION W/WO LYSIS OF ARTERY; CAROTID ARTERY  CPT(R) Code:  64332 - PR EXPLORATION N/FLWD SURG NECK ARTERY    Anesthesia: general anesthesia     INDICATIONS:  Caleb Robinson is a 69 y.o. male with a history of  Squamous cell carcinoma of oral cavity who was referred for reconstruction following oncologic surgery . A variety of reconstructive options were discussed with the patient with the final reconstructive option to be based on the intraoperative characteristics of the defect.  Preoperative lower extremity CTA demonstrated three vessel runoff in the left lower extremity, making the patient a suitable candidate for an osseocutaneous fibula free flap.  The risks, benefits, and alternatives of the different reconstructive options were explained to the patient who elected to proceed with reconstruction knowing that the optimal reconstruction would be determined intraoperatively. Given the midline  location and need for osteotomies, it was determined that the patient's reconstruction would best be accomplished using a virtual surgical plan with patient-specific cutting guides for the mandible and fibula as well as a patient-specific reconstruction plate.     FINDINGS:  1.  The osseous defect to be reconstructed measured approximately 8 cm in length and encompassed the left body to the right body.  The intraoral soft tissue defect measured 8 by 4 cm and encompassed the floor of mouth.    2. Left osseocutaneous fibula free flap with skin paddle size measuring 8 cm in length x 4 cm in width, based on a single musculocutaneous perforator.  The fibula was harvested to leave 7 cm of fibula in situ at the knee and ankle.    3. 9 cm of fibula was used for the reconstruction.  A wedge shaped ostectomy was performed using the patient specific cutting guides to re-create the midline mandible shape. The fibula was oriented with the pedicle coming left at the symphysis and the skin paddle oriented intra-orally.    4. The fibula was secured to the custom bent 2.0 mm thick Stryker reconstruction plate with  2.0 mm screws placed monocortically.     5. The arterial anastomosis was performed between the left peroneal artery and the left facial artery in an end-end fashion with 9-0 nylon suture.  The venous anastomosis was performed between the larger of the two vena comitantes and  the left retromandibular vein in an end-end fashion using a Synovis Gem Coupler.      6. Cook implantable Dopplers were placed on the flap artery and vein to facilitate postoperative monitoring and was found to have excellent signals.    DESCRIPTION OF PROCEDURE:  After the primary tumor had been resected and biopsies of the resection site returned negative for all margins, the reconstructive team was summoned to the operating room.  We examined the complicated defect of the oral cavity that resulted from the composite oral cavity resection. The  osseous defect to be reconstructed measured approximately 8 cm in length and encompassed the left body to the right body.  The intraoral soft tissue defect measured 4 by 8 cm and encompassed the floor of mouth..    The left leg had been  prepped and draped in sterile fashion prior to the ablative portion of the procedure. A line connecting the lateral epicondyle of the malleolus and the head of the fibula was marked.  A 8 cm long by 4 cm wide rectangular skin paddle was drawn on the distal fibula centered over the perforators.  We then marked out a proximal extension. The peroneal nerve and markings 7 cm from the fibula articulation at the knee and ankle were marked.    An Esmarch was used to exsanguinate the leg and the tourniquet was inflated.    We commenced the harvest of the osseocutaneous fibula free flap.  A skin incision was made along the anterior aspect of the skin paddle and along the proximal extension towards the knee with a #15 blade and we continued through the subcutaneous tissue down to the level of the fascia overlying the anterior compartment musculature.  Staying supra-fascial, the superficial peroneal nerve was identified, mobilized anteriorly, and preserved.  Once we were posterior to the superficial peroneal nerve and above the lateral compartment musculature, the fascia was sharply divided allowing [place] to enter into the lateral compartment in the subfascial plane.  The identity of the peroneus longus was confirmed.  We bluntly dissected in a subfascial plane from anterior to posterior to identify the cutaneous perforators.  A single  cutaneous perforator was identified and appeared to be adequately captured by the skin paddle. The superior and posterior skin paddle incisions were completed and we continued through the subcutaneous tissue and then the fascia superiorly and inferiorly. The peroneus was elevated off the anterior aspect of the fibula with the Bovie leaving a small amount of  peroneus on the fibula.  This allowed [place] to identify the anterior intermuscular septum which was divided allowing [place] to enter the anterior compartment.  The anterior compartment musculature was elevated off of the fibula allowing [place] to identify the intraosseous membrane.  The anterior tibial vessels and deep peroneal nerve were identified and preserved.  The intraosseous membrane was divided allowing [place] to enter into the posterior compartment of the lower extremity.  At this point we performed our proximal and distal osteotomies with the  sagittal saw and retracted the fibula laterally.  The peroneal artery and its VCs were identified distally and ligated.   From inferior to superior, we divided the posterior tibalis overlying the vascular pedicle.    We now returned to the posterior aspect of the dissection.  The plane between the soleus and FHL was identified and the FHL was separated from the overlying soleus.  The cutaneous perforator was examined from the posterior aspect for its relationship with the soleus muscle.  It was  confirmed to be a  septocutaneous perforator and not a musculocutaneous perforator and thus no soleus dissection was required.  The muscular branch to the soleus was therefore clipped and divided.  Working from the anterior and posterior side, the FHL was subsequently divided from inferior to superior, leaving the fibula attached only by its vascular pedicle.  The pedicle was traced proximally to where the peroneal artery branched from the posterior tibial artery. The VCs were traced but did not join into a single draining system.  The tourniquet was taken down and the lower extremity was allowed to re-perfuse.  The skin paddle was inspected and adequate dermal bleeding was verified. The pedicle was irrigated with papaverine. The flap was then rendered ischemic as the peroneal artery and its VCs were sequentially clipped with medium hemoclips and divided.    The excess proximal fibula was  stripped of its soft tissue covering circumferentially. The fibula was also cleaned along its medial aspect at the site of the bone cuts to prevent injury to the pedicle with the saw. We attached the patient-specific cutting guides to perform the osteotomies to cut the fibula to the appropriate length and the osteotomies to shape the fibula. Extra length was left on the distal portion of the bone given additional bone that was resected on the right body of the mandible.        The free flap was transferred to the head and neck.  The previously cut fibula was aligned with the custom bent plate. Osteotomies were noted to well approximate the midline angle of the plate. The distal fibula segment was marked and additional osteotomy was made to fit it to the plate. The fibula was then affixed to the plate using monocortical 106mm screws.     Now we proceeded with the vestibuloplasty. The skin paddle was oriented with the distal portion at the right floor of mouth and the proximal portion at the left floor of mouth.  The anterior portion of the skin paddle was affixed along the alveolar ridge, shaping and contouring to allow for future creation of an oral vestibule and prevent tethering. The posterior portion was used to create the midline ventral tongue. The suture line was inspected and noted to be intact along its circumference.This completed the vestibuloplasty.    Next we turned our attention to identifying and preparing the appropriate donor artery and recipient vein of the neck for the microvascular anastomosis and free tissue transfer. The wound was copiously irrigated with normal saline. Hemostasis was achieved with bipolar cautery and hemoclips as needed.  The operating microscope was brought onto the field.   The left superior thyroid, lingual and facial arteries were examined and the facial artery was determined to have the best caliber and geometry for free tissue transfer.  The facial artery was cleaned  circumferentially under magnification back to its origin from the external carotid artery and a 3A Acland clamp was placed at its junction with the external carotid artery. The distal end was cut and adequate blood flow was noted.  The vessel was dilated, irrigated, and re-inspected.  Next we explored the venous system of the head and neck to identify and prepare a recipient vein.  We explored the external jugular, common facial, retromandibular and internal jugular veins.  The left retromandibular vein was determined to have the best geometry and caliber for free tissue transfer and was thus selected.  A 3v Acland was placed on the recipient side. After the neck vessels had been  identified and cleaned, the flap vessels were cleaned in an analagous fashion.    We now set about performing the microvascular anastomosis.  A double Acland approximator was then brought into the field and the arterial microvascular anastomosis was performed in the usual end-to-end fashion using 9-0 nylon suture in an interrupted fashion.  The double vascular Acland clamp was removed, and attention was turned to venous microvascular anastomosis.   The venous anastomosis was performed with the vein coupler in the usual fashion.  When the microvascular anastomosis was complete, the clamp occluding the facial artery was removed and ischemia time ended.  The venous outflow portion of the flap was noted to fill and blood was noted to flow out of the 2nd, unused flap vein.   The clamp occluding the retromandibular vein was removed and the additional unused flap vein was clipped. Cook implantable Dopplers were placed around the flap artery and vein and noted to have an excellent signals. This completed the microvascular anastomosis.    Three 10-mm flat JP drains were obtained and placed in the lateral gutter of the neck and submental region of the neck and sutured into place with 2-0 silk suture.  The neck was closed with a 3-0 Vicryl suture in  an interrupted fashion for the deep layer and a 4-0 running Monocryl for the skin.  The cervical JP drains were noted to hold suction at the conclusion of the case.    Having completed the reconstruction of the head and neck defect, we turned our attention to closing the fibula donor site.  It was clear that the skin paddle harvest site could not be closed primarily and that a split thickness skin graft would be required.  Using a Dermatome, a split thickness skin graft measuring 4 x 8 cm was harvested.  Hemostasis was obtained at the split thickness skin graft donor site.  This concluded the split thickness skin graft harvest.    We now concluded closing the fibula donor site. The lower extremity wound was copiously irrigated.  Hemostasis was ensured.  A #10 flap JP drain was placed at the depth of this harvest site in order to prevent seroma or hematoma formation.  The deep layer was closed with 3-0 Vicryl suture in an interrupted fashion.  The skin was closed with a 4.0 monocryl. The split thickness skin graft was sutured to the fibula skin paddle donor site where it was not able to be closed primarily.  The lower extremity was then placed in a negative pressure dressing and splinted in slight extension.    This marked the conclusion of the procedure.  The reinforced endotracheal tube which had been placed at the tracheotomy site was replaced with a cuffed #6 Shiley tracheostomy tube.  Proper placement was confirmed by the anesthesia team.  The tracheotomy tube was sutured into place with a 2-0 silk suture.  The patient was then awakened from general anesthesia and transferred to the ICU in stable condition.    Estimated Blood Loss: 400 mL    Urine Output: 1075 mL    Specimens: None    Implants:  1. Stryker 2.0 mm thick  mandible reconstruction plate  2. Synovis gem microvascular coupler  3. Cook implantable Doppler x 2 placed around the flap artery and vein    Drains and Retained Items (Surgical):  1. 3  #10 Fr  JP drains in the neck and one in the left lower extremity  2. Nasogastric tube    Surgeon Evaluation  of Wound Class: II    Complications: None    Expected Return to OR: Yes for further management of head and neck cancer and free flap reconstruction.    Attestations: Dr. Nolberto Hanlon was present for the entirety of the procedure.    Signed:  Lorenza Cambridge  10/04/2020  9:21 AM    Electronically signed by Roney Marion, MD at 10/09/2020  1:46 PM EDT

## 2020-09-28 NOTE — Progress Notes (Signed)
Formatting of this note is different from the original.  **Any questions about this patient or this note should be referred to  the STICU resident [phone]) or the STICU attending surgeon [phone])**    STICU / Adult [facility] [place] [facility] [place]    Patient Name: Caleb Robinson  Age: 69 y.o.  Sex: male  MRN: [id number]  Date: September 28, 2020    Hospital day: 1  Primary Diagnosis: <principal problem not specified>    Admitting Physician: Junie Panning, MD  Current Attending Physician: Jerrye Beavers Day, MD    Code Status: Full code  Medical decision maker: Adult Child (Name: Guadalupe Maple)    Brief HPI:  Caleb Robinson is a 69 y.o male newly diagnosed with FOM lesion, cervical node, and lung nodules on PET scan. Symptoms started approximately 3-4 months ago with mild tenderness under tongue, progressing to left tender neck mass. He has a significant past medical history of COPD and currently still smokes. Unknown biopsy timing. Patient underwent composite oral cavity resection, segmental mandibulectomy, bilateral neck dissection with left fibula free flap, STSG, tracheostomy, and total odontotomy.   He presents to the ICU on Neo-synephrine drip for hypotension, and Precedex drip for sedation. No required MAP goals.    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 Admitted to STICU for hypotension requiring pressors and precedex gtt for sedation, no MAP goals required. Initially weaned off neo overnight, MAPs decreased requiring pressors at 4 am, now on neo @ 40. Off precedex.    Objective:  Physical Examination:   Vital signs:   Pulse:  Pulse Readings from Last 1 Encounters:   09/28/20 (!) 48     Blood Pressure:   BP Readings from Last 1 Encounters:   09/28/20 107/44     Last Temp:  Temp Readings from Last 1 Encounters:   09/28/20 36.3 C (97.3 F) (Axillary)     Temp Avg/Min/Max: Temp  Avg: 36.4 C (97.5 F)  Min: 36.2 C (97.2  F)  Max: 36.7 C (98.1 F)     Resp Rate:   Resp Readings from Last 1 Encounters:   09/28/20 14     Pulse Ox:   SpO2 Readings from Last 1 Encounters:   09/28/20 100%     Intake / Output:  I/O last 3 completed shifts:  In: 7506 [I.V.:1500; Other:6; IV [id number]]  Out: 8938 [Urine:1250; Blood:400]     Physical Examination/Physiologic Support:  General: No apparent distress              Neuro: GCS:   Eye Opening: 2  To Pain                                      Verbal Response: 1T Unable to assess due to Intubation/Tracheostomy                                      Motor Response: 5- Localizes Stimuli                          Pupils: Reactive: Bilateral  Exam: No focal deficits              Head: Normocephalic              Eyes: PERRL              ENT: Tracheostomy:  Tracheostomy in place and secured and bilateral neck incision with JP x2              Hemodynamics: Current Cardiac Rhythm: Normal Sinus Rhythm                                          Pressor Agents in use: Neosynephrine: Stable dosage                                          Heart Exam: Rhythm:  regular              Vascular: well perfused with normal pulses in the distal extremities              Pulmonary: Diminished breath sounds: both                                      Ventilation:  Breathing spontaneously              Abdomen: Nontender and Soft              Nutrition: NPO              Renal/Genitourinary: Foley catheter in place              Musculoskeletal: Left leg boot              Skin: Other Wounds Left thigh incision site with On-Q, and left fibula incision site with JP and On-Q              Psych: Not assessed    Current Diet:   NPO diet OTHER (COMMENTS)  Laboratory values:    Recent Results (from the past 24 hour(s))   ARTERIAL BLOOD GAS, POC    Collection Time: 09/27/20 10:09 AM   Result Value Ref Range    Patient Temp 34.8 degC    PH (Uncorr), Arterial 7.37 7.35 - 7.45    PH (Corr), Arterial 7.41 7.35 - 7.45     PCO2 (Uncorr), Arterial 47 (H) 35 - 45 mmHg    PCO2 (Corr), Arterial 42 35 - 45 mmHg    PO2 (Uncorr), Arterial 114 (H) 80 - 100 mmHg    PO2 (Corr), Arterial 101 (H) 80 - 100 mmHg    Bicarb, Arterial 27.2 (H) 22.0 - 26.0 mmol/L    Total Co2, Arterial 27.7 (H) 23.0 - 27.0 mmol/L    Base, Arterial 1.4 -2.0 - 3.0 mmol/L    O2 Sat, Arterial 98.3 (H) 96.0 - 97.0 %    Sodium, Arterial 137 135 - 145 mmol/L    Potassium, Arterial 4.2 3.5 - 4.9 mmol/L    Chloride, Arterial 102 mmol/L    Anion Gap, Artieral 7.3 2 - 11    Glucose, Arterial 127 (H) 70 - 100 mg/dL    Creatinine 0.7 0.6 - 1.3 mg/dL    Urea Nitrogen, Blood 10  8 - 26 mg/dL    Egfr 97 mL/min/1.73 sq.m    Hemoglobin, Arterial 12.9 (L) 14.0 - 18.0 g/dL    Hematocrit, Arterial 38 (L) 42 - 52 %   LACTATE, POC    Collection Time: 09/27/20 10:09 AM   Result Value Ref Range    Lactate 0.8 0.5 - 1.6 mmol/L   IONIZED CALCIUM, ARTERIAL POC    Collection Time: 09/27/20 10:09 AM   Result Value Ref Range    Ionized Calcium, Arterial 1.14 1.14 - 1.29 mmol/L   COMPREHENSIVE METABOLIC PANEL    Collection Time: 09/27/20 10:24 AM   Result Value Ref Range    Sodium 138.0 135.0 - 145.0 mmol/L    Potassium 4.2 3.5 - 5.1 mmol/L    Chloride 105 98 - 108 mmol/L    Co2 Content 24 22 - 29 mmol/L    Anion Gap 9 2 - 11 mmol/L    Glucose 129.0 (H) 70.0 - 100.0 mg/dL    Urea Nitrogen, Blood 9 8 - 26 mg/dL    Creatinine 0.8 0.7 - 1.3 mg/dL    Egfr >60   mL/min/1.73 sq.m    Calcium 8.4 8.4 - 10.3 mg/dL    Bilirubin, Total 0.3 0.2 - 1.2 mg/dL    Ast(SGOT) 21 5 - 34 U/L    Alt (Sgpt) 16 5 - 45 U/L    Alkaline Phosphatase 74 35 - 150 U/L    Total Protein 5.5 (L) 6.4 - 8.3 g/dL    Albumin 3.2 (L) 3.5 - 5.0 g/dL   MAGNESIUM    Collection Time: 09/27/20 10:24 AM   Result Value Ref Range    Magnesium 1.5 (L) 1.6 - 2.6 mg/dL   CBC    Collection Time: 09/27/20 10:24 AM   Result Value Ref Range    White Blood Cell Count 8.86 4.80 - 10.80 K/cumm    Red Blood Cell Count 4.03 (L) 4.70 - 6.10 M/cumm     Hemoglobin 13.0 (L) 14.0 - 18.0 gm/dL    Hematocrit 38.4 (L) 42.0 - 52.0 %    Mean Corpuscular Volume 95.3 (H) 80.0 - 94.0 fL    Mean Corpuscular Hemoglobin 32.3 (H) 27.0 - 31.0 pg    Mean Corpuscular Hemoglobin Conc 33.9 30.7 - 34.4 gm/dL    Red Cell Distribution Width 13.4 11.5 - 14.5 %    Platelet Count 271 140 - 440 K/cumm    Mean Platelet Volume 10.4 9.2 - 12.3 fL    Nucleated Red Blood Cells 0.0 0.0 - 0.2 /100 WBC   ARTERIAL BLOOD GAS, POC    Collection Time: 09/27/20 10:53 AM   Result Value Ref Range    Patient Temp 34.1 degC    PH (Uncorr), Arterial 7.37 7.35 - 7.45    PH (Corr), Arterial 7.41 7.35 - 7.45    PCO2 (Uncorr), Arterial 43 35 - 45 mmHg    PCO2 (Corr), Arterial 38 35 - 45 mmHg    PO2 (Uncorr), Arterial 83 80 - 100 mmHg    PO2 (Corr), Arterial 69 (L) 80 - 100 mmHg    Bicarb, Arterial 24.4 22.0 - 26.0 mmol/L    Total Co2, Arterial 25.0 23.0 - 27.0 mmol/L    Base, Arterial -1.0 -2.0 - 3.0 mmol/L    O2 Sat, Arterial 95.8 (L) 96.0 - 97.0 %    Sodium, Arterial 140 135 - 145 mmol/L    Potassium, Arterial 3.4 (L) 3.5 - 4.9 mmol/L    Chloride, Arterial 106 mmol/L  Anion Gap, Artieral 9 2 - 11    Glucose, Arterial 114 (H) 70 - 100 mg/dL    Creatinine 0.6 0.6 - 1.3 mg/dL    Urea Nitrogen, Blood 8 8 - 26 mg/dL    Egfr 103 mL/min/1.73 sq.m    Hemoglobin, Arterial 11.1 (L) 14.0 - 18.0 g/dL    Hematocrit, Arterial 33 (L) 42 - 52 %   LACTATE, POC    Collection Time: 09/27/20 10:53 AM   Result Value Ref Range    Lactate 0.8 0.5 - 1.6 mmol/L   IONIZED CALCIUM, ARTERIAL POC    Collection Time: 09/27/20 10:53 AM   Result Value Ref Range    Ionized Calcium, Arterial 1.00 (L) 1.14 - 1.29 mmol/L   ARTERIAL BLOOD GAS, POC    Collection Time: 09/27/20  3:26 PM   Result Value Ref Range    Patient Temp 36.0 degC    PH (Uncorr), Arterial 7.34 (L) 7.35 - 7.45    PH (Corr), Arterial 7.35 7.35 - 7.45    PCO2 (Uncorr), Arterial 45 35 - 45 mmHg    PCO2 (Corr), Arterial 43 35 - 45 mmHg    PO2 (Uncorr), Arterial 66 (L) 80 - 100  mmHg    PO2 (Corr), Arterial 62 (L) 80 - 100 mmHg    Bicarb, Arterial 24.3 22.0 - 26.0 mmol/L    Total Co2, Arterial 25.0 23.0 - 27.0 mmol/L    Base, Arterial -1.6 -2.0 - 3.0 mmol/L    O2 Sat, Arterial 91.2 (L) 96.0 - 97.0 %    Sodium, Arterial 138 135 - 145 mmol/L    Potassium, Arterial 3.5 3.5 - 4.9 mmol/L    Chloride, Arterial 104 mmol/L    Anion Gap, Artieral 9 2 - 11    Glucose, Arterial 112 (H) 70 - 100 mg/dL    Creatinine 0.6 0.6 - 1.3 mg/dL    Urea Nitrogen, Blood 7 (L) 8 - 26 mg/dL    Egfr 103 mL/min/1.73 sq.m    Hemoglobin, Arterial 10.4 (L) 14.0 - 18.0 g/dL    Hematocrit, Arterial 30 (L) 42 - 52 %   LACTATE, POC    Collection Time: 09/27/20  3:26 PM   Result Value Ref Range    Lactate 0.9 0.5 - 1.6 mmol/L   IONIZED CALCIUM, ARTERIAL POC    Collection Time: 09/27/20  3:26 PM   Result Value Ref Range    Ionized Calcium, Arterial 1.02 (L) 1.14 - 1.29 mmol/L   GLUCOSE WHOLE BLOOD, POC    Collection Time: 09/27/20  6:43 PM   Result Value Ref Range    Glucose, Whole Blood 95.0 70.0 - 100.0 mg/dL   ARTERIAL BLOOD GAS, POC    Collection Time: 09/27/20  7:46 PM   Result Value Ref Range    Patient Temp 36.7 degC    PH (Uncorr), Arterial 7.31 (L) 7.35 - 7.45    PH (Corr), Arterial 7.31 (L) 7.35 - 7.45    PCO2 (Uncorr), Arterial 50 (H) 35 - 45 mmHg    PCO2 (Corr), Arterial 49 (H) 35 - 45 mmHg    PO2 (Uncorr), Arterial 67 (L) 80 - 100 mmHg    PO2 (Corr), Arterial 66 (L) 80 - 100 mmHg    Bicarb, Arterial 25.1 22.0 - 26.0 mmol/L    Total Co2, Arterial 25.8 23.0 - 27.0 mmol/L    Base, Arterial -1.4 -2.0 - 3.0 mmol/L    O2 Sat, Arterial 90.8 (L) 96.0 - 97.0 %  Sodium, Arterial 138 135 - 145 mmol/L    Potassium, Arterial 4.4 3.5 - 4.9 mmol/L    Chloride, Arterial 104 mmol/L    Anion Gap, Artieral 8.2 2 - 11    Glucose, Arterial 145 (H) 70 - 100 mg/dL    Creatinine 0.7 0.6 - 1.3 mg/dL    Urea Nitrogen, Blood 7 (L) 8 - 26 mg/dL    Egfr 97 mL/min/1.73 sq.m    Hemoglobin, Arterial 10.0 (L) 14.0 - 18.0 g/dL    Hematocrit,  Arterial 29 (L) 42 - 52 %   LACTATE, POC    Collection Time: 09/27/20  7:46 PM   Result Value Ref Range    Lactate 2.2 (H) 0.5 - 1.6 mmol/L   IONIZED CALCIUM, ARTERIAL POC    Collection Time: 09/27/20  7:46 PM   Result Value Ref Range    Ionized Calcium, Arterial 1.24 1.14 - 1.29 mmol/L   BASIC METABOLIC PANEL    Collection Time: 09/27/20  7:48 PM   Result Value Ref Range    Sodium 140.0 135.0 - 145.0 mmol/L    Potassium 4.4 3.5 - 5.1 mmol/L    Chloride 108 98 - 108 mmol/L    Co2 Content 19 (L) 22 - 29 mmol/L    Anion Gap 13 (H) 2 - 11 mmol/L    Glucose 126.0 (H) 70.0 - 100.0 mg/dL    Urea Nitrogen, Blood 7 (L) 8 - 26 mg/dL    Creatinine 0.6 (L) 0.7 - 1.3 mg/dL    Egfr >60   mL/min/1.73 sq.m    Calcium 8.9 8.4 - 10.3 mg/dL   CBC    Collection Time: 09/27/20  7:48 PM   Result Value Ref Range    White Blood Cell Count 20.48 (H) 4.80 - 10.80 K/cumm    Red Blood Cell Count 3.14 (L) 4.70 - 6.10 M/cumm    Hemoglobin 10.2 (L) 14.0 - 18.0 gm/dL    Hematocrit 30.1 (L) 42.0 - 52.0 %    Mean Corpuscular Volume 95.9 (H) 80.0 - 94.0 fL    Mean Corpuscular Hemoglobin 32.5 (H) 27.0 - 31.0 pg    Mean Corpuscular Hemoglobin Conc 33.9 30.7 - 34.4 gm/dL    Red Cell Distribution Width 13.7 11.5 - 14.5 %    Platelet Count 220 140 - 440 K/cumm    Mean Platelet Volume 10.6 9.2 - 12.3 fL    Nucleated Red Blood Cells 0.0 0.0 - 0.2 /100 WBC   MAGNESIUM    Collection Time: 09/27/20  7:48 PM   Result Value Ref Range    Magnesium 1.6 1.6 - 2.6 mg/dL   PHOSPHORUS    Collection Time: 09/27/20  7:48 PM   Result Value Ref Range    Phosphorus 2.9 2.3 - 4.7 mg/dL   APTT    Collection Time: 09/27/20  7:48 PM   Result Value Ref Range    aPTT 21.9 (L) 25.6 - 36.0 seconds   PROTIME-INR    Collection Time: 09/27/20  7:48 PM   Result Value Ref Range    PT 15.7 (H) 12.0 - 14.6 seconds    INR 1.24 (H) 0.90 - 1.20   GLUCOSE WHOLE BLOOD, POC    Collection Time: 09/27/20 11:46 PM   Result Value Ref Range    Glucose, Whole Blood 145.0 (H) 70.0 - 100.0 mg/dL    CBC    Collection Time: 09/28/20  3:33 AM   Result Value Ref Range    White Blood Cell Count 16.91 (  H) 4.80 - 10.80 K/cumm    Red Blood Cell Count 2.82 (L) 4.70 - 6.10 M/cumm    Hemoglobin 9.2 (L) 14.0 - 18.0 gm/dL    Hematocrit 26.9 (L) 42.0 - 52.0 %    Mean Corpuscular Volume 95.4 (H) 80.0 - 94.0 fL    Mean Corpuscular Hemoglobin 32.6 (H) 27.0 - 31.0 pg    Mean Corpuscular Hemoglobin Conc 34.2 30.7 - 34.4 gm/dL    Red Cell Distribution Width 13.6 11.5 - 14.5 %    Platelet Count 168 140 - 440 K/cumm    Mean Platelet Volume 10.7 9.2 - 12.3 fL    Nucleated Red Blood Cells 0.0 0.0 - 0.2 /100 WBC   BASIC METABOLIC PANEL    Collection Time: 09/28/20  3:33 AM   Result Value Ref Range    Sodium 138.0 135.0 - 145.0 mmol/L    Potassium 4.3 3.5 - 5.1 mmol/L    Chloride 108 98 - 108 mmol/L    Co2 Content 23 22 - 29 mmol/L    Anion Gap 7 2 - 11 mmol/L    Glucose 184.0 (H) 70.0 - 100.0 mg/dL    Urea Nitrogen, Blood 8 8 - 26 mg/dL    Creatinine 0.7 0.7 - 1.3 mg/dL    Egfr >60   mL/min/1.73 sq.m    Calcium 8.1 (L) 8.4 - 10.3 mg/dL   PHOSPHORUS    Collection Time: 09/28/20  3:33 AM   Result Value Ref Range    Phosphorus 3.2 2.3 - 4.7 mg/dL   MAGNESIUM    Collection Time: 09/28/20  3:33 AM   Result Value Ref Range    Magnesium 1.6 1.6 - 2.6 mg/dL   ARTERIAL BLOOD GAS, POC    Collection Time: 09/28/20  3:34 AM   Result Value Ref Range    Patient Temp 36.3 degC    PH (Uncorr), Arterial 7.38 7.35 - 7.45    PH (Corr), Arterial 7.39 7.35 - 7.45    PCO2 (Uncorr), Arterial 44 35 - 45 mmHg    PCO2 (Corr), Arterial 43 35 - 45 mmHg    PO2 (Uncorr), Arterial 136 (H) 80 - 100 mmHg    PO2 (Corr), Arterial 132 (H) 80 - 100 mmHg    Bicarb, Arterial 26.0 22.0 - 26.0 mmol/L    Total Co2, Arterial 26.5 23.0 - 27.0 mmol/L    Base, Arterial 0.7 -2.0 - 3.0 mmol/L    O2 Sat, Arterial 99.0 (H) 96.0 - 97.0 %    Sodium, Arterial 138 135 - 145 mmol/L    Potassium, Arterial 4.3 3.5 - 4.9 mmol/L    Chloride, Arterial 103 mmol/L    Anion Gap, Artieral 8.5 2  - 11    Glucose, Arterial 183 (H) 70 - 100 mg/dL    Creatinine 0.7 0.6 - 1.3 mg/dL    Urea Nitrogen, Blood 9 8 - 26 mg/dL    Egfr 97 mL/min/1.73 sq.m    Hemoglobin, Arterial 8.4 (L) 14.0 - 18.0 g/dL    Hematocrit, Arterial 25 (L) 42 - 52 %   LACTATE, POC    Collection Time: 09/28/20  3:34 AM   Result Value Ref Range    Lactate 0.9 0.5 - 1.6 mmol/L   IONIZED CALCIUM, ARTERIAL POC    Collection Time: 09/28/20  3:34 AM   Result Value Ref Range    Ionized Calcium, Arterial 1.18 1.14 - 1.29 mmol/L   GLUCOSE WHOLE BLOOD, POC    Collection Time: 09/28/20  5:02 AM  Result Value Ref Range    Glucose, Whole Blood 153.0 (H) 70.0 - 100.0 mg/dL     Assessment:  Caleb Robinson is a 69 y.o. year old male admitted to [facility] for hourly flap checks and pressor management s/p oral resection for SCC.     Plan:     Neuro - Pain control: multimodal pain control w/ gabapentin, tylenol, oxy/dilaudid prn, On-Q ropivacaine on L leg donor site  - Sedation: precedex off  - Melatonin QHs    #Depression  - home Remeron started   HEENT -# Oral SCC s/p resection  - Q1hr flap checks  - ASA, celebrex  - Unasyn x24 hrs  - no MAP goal or HOB goal   CV - Pressors: weaned neo overnight, back on neo @ 40 this AM, wean as tolerated  - MAP goal physiologic  - ASA 325    # HTN  - hold home amlodipine in setting of hypotension    # HLD  - crestor restarted   Resp - Trach collar, satting 100%  - Pulm toilet    # COPD  - sch duonebs and prn   FEN/GI - NPO diet OTHER (COMMENTS)   - Feeding tube in place, only meds for now  - Tube feeds: consider starting trickle feeds today  - MIVF: LR @75  mL/hr  - Replete lytes per protocol  - Bowel regimen  - Anti-emetics PRN   GU - Strict I/Os  - Foley  - Uop 2L    Recent Labs     09/27/20  1946 09/27/20  1948 09/28/20  0333 09/28/20  0334   CREATININE 0.7 0.6* 0.7 0.7     Endo Recent Labs     09/27/20  1843 09/27/20  2346 09/28/20  0502   GLUCOSEPCXWH 95.0 145.0* 153.0*     - SSI#1   MSK - OOB as tolerated  -  On-Q ropivacaine on L leg donor site  - Maintain L lower leg in boot   Skin - Routine wound/skin care   Heme Recent Labs     09/27/20  1024 09/27/20  1948 09/28/20  0333   HGB 13.0* 10.2* 9.2*     - H&H stable, continue to monitor. Transfuse for Hgb <7 or signs of active bleeding.   ID Recent Labs     09/27/20  1024 09/27/20  1948 09/28/20  0333   WBC 8.86 20.48* 16.91*     - Unasyn x24 hrs per ENT   PPx - DVT: SCDs, Lovenox 30 mg BID  - GI: Pepcid   LDAs PIVs  Foley: keep while on pressors   Dispo - STICU     Judieth Keens, MD  Neurosurgery  09/28/20  Pager: [phone]    Attending Attestation    I have reviewed and discussed with the house staff / physician extenders the clinical course and findings above, as well as laboratory reports, X-ray reports, X-ray films, results of additional medical testing and monitored output as noted above.  I personally examined the patient and discussed care plans on rounds with the house staff / physician extenders.  My additional comments are:    Hypovolemic shock - wean pressors.  Keep euvolemic  Anemia (acute blood loss) - will monitor hemoglobin level and follow hemodynamic status for signs of hemorrhage.  Will consider transfusion for hemoglobin between 7 and 8 or for signs of hemorrhage.  SIRS - Monitor for infection.  Due to acute stress response but will culture if febrile >  38.5 and follow cultures for significant chance for infection.  Hypomagnesemia - replace to level of 2  Hyperglycemia - insulin therapy    Patient is critically ill / injured.  I spent 38 minutes providing critical care to this patient.  This time is exclusive of time performing procedures or teaching.  Overall risk of complications / mortality: High.    Tereasa Coop  Pager ID: [phone]  09/28/2020  9:19 AM    Electronically signed by Tereasa Coop, MD at 09/28/2020  9:20 AM EDT

## 2020-09-28 NOTE — Consults (Signed)
Associated Order(s): IP CONSULT TO NUTRITION SERVICES; IP CONSULT TO NUTRITION SERVICES  Formatting of this note is different from the original.  Nutrition  Adult [facility]    Patient Name: Caleb Robinson  Age: 69 y.o.  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 He reports difficulty with solid foods at home but was able to tolerate mostly liquids and some softer foods. He also endorses drinking oral nutrition supplements.  In addition he had a 15lbs or 14% body weight loss from his UBW of 110lbs.  +DHT tube    Pain affecting PO intake:?Prior to admission but not currently    Current Nutrition Order: NPO diet OTHER (COMMENTS)  Peptamen Intense VHP  Peptamen Intense VHP   Food Allergies/Intolerances: NKFA    Client History/Diagnoses  Pt is a 69 y.o.male with FOM lesion, cervical node, and lung nodules on PET scan. Symptoms started approximately 3-4 months ago with mild tenderness under tongue, progressing to left tender neck mass. Now s/p composite oral cavity resection, segmental mandibulectomy, bilateral neck dissection with left fibula free flap, STSG, tracheostomy, and total odontotomy.     Past Medical History  History reviewed. No pertinent past medical history.    Surgical History  History reviewed. No pertinent surgical history.    I/O:  I/O last 3 completed shifts:  In: 8813.71 [I.V.:2587.71; Other:126; IV WUJWJXBJY:7829]  Out: 5621 [HYQMV:7846; Other:360; Blood:400]    Medications/Vitamins/Herbals/Supplements  Scheduled Meds:  ? acetaminophen  650 mg Per OG/NG  Tube 4 times per day   ? ampicillin-sulbactam  3 g Intravenous Q6H   ? aspirin  325 mg Per OG/NG Tube Daily   ? bacitracin zinc   Topical BID   ? celecoxib  200 mg Oral BID   ? docusate sodium  100 mg Per G Tube BID   ? enoxaparin  40 mg Subcutaneous Q24H   ? famotidine  20 mg Oral BID   ? gabapentin  300 mg Per OG/NG Tube 3 times per day   ? insulin aspart  1-5 Units Subcutaneous 6 times per day   ? ipratropium-albuterol  3 mL Inhalation Q6H [place]   ? melatonin  5 mg Oral QHS   ? mirtazapine  15 mg Per OG/NG Tube QHS   ? protein supplement  30 mL Oral BID   ? rosuvastatin  10 mg Oral Daily   ? sennosides  8.8 mg Per G Tube BID     Continuous Infusions:  ? dexmedeTOMIDine (PRECEDEX) 4 mcg/mL in NaCl 0.9% 100 mL infusion Stopped (09/28/20 0445)   ? lactated ringers infusion 75 mL/hr (09/28/20 0800)   ? phenylephrine (Neo-Synephrine) 160 mcg/mL in NaCl 0.9% 250 mL infusion 20 mcg/min (09/28/20 0800)   ? ropivacaine (PF) (NAROPIN) 0.2 % in NaCl 0.9% 400 mL ON-Q (PAINBUSTER) irrigation pump 5 mL/hr (09/27/20 1939)   ? ropivacaine (PF) (NAROPIN) 0.2 % in NaCl 0.9% 750 mL ON-Q (PAINBUSTER SELECT-A-FLOW) irrigation 10 mL/hr (09/27/20 1939)     PRN Meds:.dextrose, dextrose, glucagon, glucose, HYDROmorphone, ipratropium-albuterol, ondansetron, oxycodone, promethazine    Labs  Lab Results  Component Value Date    NA 138.0 09/28/2020    K 4.3 09/28/2020    CL 103 09/28/2020    GLUCOSE 184.0 (H) 09/28/2020    BUN 9 09/28/2020    CREATININE 0.7 09/28/2020    CALCIUM 8.1 (L) 09/28/2020    MG 1.6 09/28/2020    PHOSPHORUBLD 3.2 09/28/2020    HGBA1C 5.0 09/12/2020    ALBUMINBLD 3.2 (L) 09/27/2020    ALT 16 09/27/2020    AST 21 09/27/2020     Recent Labs     09/27/20  1843 09/27/20  2346 09/28/20  0502 09/28/20  0759   GLUCOSEPCXWH 95.0 145.0* 153.0* 143.0*     Anthropometric Measurements  Wt Readings from Last 3 Encounters:   09/27/20 (!) 43.1 kg (95 lb 0.3 oz)   09/12/20 (!) 42.9 kg (94 lb 9.6 oz)   08/28/20 45.9 kg (101 lb 4.8 oz)      Estimated body mass index is 16.31 kg/m as calculated from the following:    Height as of this encounter: 162.6 cm (5\' 4" ).    Weight as of this encounter: 43.1 kg (95 lb 0.3 oz).  UBW: 110lbs  IBW: 130lbs  Wt Change:14% body weight x 3 months    Nutrition Focused Physical Exam   Pt meets ASPEN/AND criteria for severe chronic malnutrition.   Overall Appearance: WDL    Dentition/Oral:   missing teeth  Cognitive/Neuro:   A/O x 4  Edema:  none  Skin:  incision(s) WDL  GI/Abdominal Appearance: flat, +BS  Last BM:   ,  PTA     Estimated Nutritional Needs: Anabolism and Maintenance    total kcal / day  1290-1505 kcal / kg / day 30-35   gm protein / day 65-86 gm protein / kg / day 1.5-2   ML water / day 1290-1505 mL / kg / day 30-35     Diet Education Needs: Pt will likely need HEN/diet instruction prior to d/c.    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     Nutrition Interventions   Formula / solution (ND-2.1.1)  Feeding tube flush (ND-2.1.4)    Discharge and Transfer of Nutrition Care to New Setting or Provider    Enteral Nutrition   Access: NG tube  Formula: Nutren 1.5  24HR Goal Volume: 954mL   Flush: 69mL  Q4H  Protein Modular: Prosource 63mL daily   Provides:   1500 kcal / day 35 kcal / kg / day 169 gm CHO / day   80 gm Pro/ day 1.9 gm protein / kg / day 747 mL free water     Enteral Nutrition (Bolus/Home)  Access: DHT tube  Formula:  Nutren 1.5  Bolus Volume: 224mL  Bolus Frequency: Q6H  Flush before and after each bolus: 62mL  Protein Modular: Prosource 2mL daily   Provides:   1560 kcal / day 36 kcal / kg / day 176 gm CHO / day   83 gm Pro/ day 1.9 gm protein / kg / day 764 mL free water     Nutrition Goals   EN / PN: receive at least 80 % of prescribed kcalories / protein, EN: tolerate at goal rate without any difficulties reported, Labs: Euglycemia, Constipation /  Diarrhea: achieve regular BM pattern and Diet Instruction: verbalize understanding of HEN diet    Monitoring and Evaluation  Total energy intake (FH-1.1.1.1)   Weight change (AD-1.1.4)   Glucose/endocrine profile WDL (FW-2.6)  Metabolic rate profile WDL (BD-1.8)  Digestive system (mouth to rectum) (PD-1.1.5)    Nutrition Plan   1. Initiate enteral nutrition with formula and goal as outlined above.   2. Start prenatal multivitamin and 100mg  thiamin daily x 3-5 days.  3. When tolerating EN at continuous EN goal rate transition to bolus regimen - recommend: 283ml q6h + 77ml ProSource daily. Flush with 60 ml water before and after each bolus.  4. If pt to discharge home on enteral nutrition and insurance unable to cover EN consider the following over the counter regimen: 4 cartons Boost Plus (or equivalent formula) daily + either 30 ml ProStat daily or 1 scoops of pure whey protein powder daily (assuming each scoop provides ~20g of protein).  5. If pt is to discharge home on TF, please place referral to Outpatient Dietitian: [facility] or pt preferred provider for monitoring and surveillance of nutrition support.   6. RD will continue to monitor pt clinical course and will adjust nutrition interventions PRN.  Patient's plan of care discussed during multidisciplinary rounds or with member of the care team and/or orders acknowledged by physician in [place].    Bedelia Person, RD-AP, LD, CNSC  Pager ID: [phone]      Electronically signed by Bedelia Person, RD-AP, LD, CNSC at 09/28/2020  2:50 PM EDT

## 2020-09-28 NOTE — Progress Notes (Signed)
Formatting of this note might be different from the original.  ENT Flap Check Note    Flap examined at bedside. Patient doing well on trach collar, some bloody trach secretions.     Color: Pink  Capillary refill: <3 seconds  Turgor: Good with no evidence of edema or congestion  Arterial Signal: Strong, pulsatile  Venous Signal: Triphasic     Will continue q1hr nursing and q4hr MD flap checks through 10/22 @ [place]  PGY-2, Otolaryngology [facility] [place] [facility] [place]   Pager: [phone]    Electronically signed by Arelia Sneddon, MD at 09/28/2020 10:45 AM EDT

## 2020-09-28 NOTE — Progress Notes (Signed)
Formatting of this note is different from the original.  Pharmacy Medication History Note    Action Items for Team to Address:      COPD  -Hold home albuterol as patient is currently controlled on Duo-Neb PRN. Can consider restarting upon discharge   -Hold home Trelegy as it is not on formulary. May substitute with scheduled Duo-Neb and Symbicort as patient is on trach collar.     HTN  - Hold home anti-hypertensives as patient is still requiring pressor support. Restart as appropriate    Dyslipidemia   -Continue rosuvastatin   -Continue aspirin 325mg  in place of home 81 mg     Pain  -Hold home ibuprofen for increased risk of bleeding. Can consider restarting upon discharge   -Continue gabapentin     Insomnia  -Can consider restarting melatonin if patient has trouble sleeping     Hx of abnormal weight loss   -Continue mirtazapine   -Continue daily home appetite/protein supplement     GI  -Can consider restarting home ascorbic acid, cholecalciferol, ferrous sulfate, zinc, and multivitamin. May consider upon discharge  -Patient is unsure of his home potassium replacement dose - supplement as needed while admitted    Discrepancies between original med history and updated Medication list: See below    Current list of home medications:  Medication Sig   ? albuterol 0.083 % (Proventil) 2.5 mg /3 mL (0.083 %) nebulizer solution Inhale 2.5 mg into the lungs if needed.    ? amLODIPine (NORVASC) 5 mg tablet Take 5 mg by mouth daily.    ? ascorbic acid (vitamin C) (vitamin C) 100 mg tablet Take 100 mg by mouth daily.   ? aspirin 81 mg delayed release enteric coated tablet Take 81 mg by mouth daily.    ? cholecalciferol (vitamin D3) 25 mcg (1,000 unit) capsule Take 1,000 Units by mouth daily.    ? ferrous sulfate 325 mg (65 mg iron) tablet Take 65 mg of iron by mouth each morning before breakfast.    ? fluticasone-umeclidinium-vilanterol (Trelegy Ellipta) 100-62.5-25 mcg/dose powder inhaler Inhale 1 puff into the lungs once  daily.    ? gabapentin (NEURONTIN) 100 mg capsule Take 200 mg by mouth 3 times daily.    ? ibuprofen (MOTRIN) 800 mg tablet Take 800 mg by mouth if needed.    ? melatonin 10 mg tablet Take 10 mg by mouth at bedtime as needed.    ? mirtazapine (REMERON) 15 mg tablet Take 15 mg by mouth at bedtime.    ? multivitamin tablet Take 1 tablet by mouth daily.    ? OTHER MEDICATION daily. appetite supplement   ? potassium tablets Take by mouth daily.    ? rosuvastatin (CRESTOR) 10 mg tablet Take 10 mg by mouth daily.    ? zinc gluconate 50 mg tablet Take 50 mg by mouth daily.     Completed medication history for [name]    Patient able to provide history? Yes    Name of pharmacy and phone number: [facility]    Cyril Loosen, PharmD Candidate 2022    Governor Rooks, PharmD  PGY2 Pharmacotherapy Resident       Electronically signed by Governor Rooks, PharmD at 09/29/2020  7:47 AM EDT

## 2020-09-28 NOTE — Procedures (Signed)
Associated Order(s): Insert Arterial Line  Post-Procedure Diagnose(s): Hypovolemic shock  Formatting of this note is different from the original.  Caleb Robinson is a 69 y.o. male patient.  1. Cancer of oral cavity      History reviewed. No pertinent past medical history.  Blood pressure (!) 100/35, pulse 50, temperature 36.3 C (97.3 F), temperature source Axillary, resp. rate 11, height 162.6 cm (5\' 4" ), weight (!) 43.1 kg (95 lb 0.3 oz), SpO2 100 %.    Insert Arterial Line  Date/Time: 09/28/2020 10:50 AM  Universal Protocol  Patient identity confirmed by: patient MRN and patient name  Time out called and performed immediately prior to the procedure  Preparation  Barrier precautions used: gloves, mask, patient drape  Prep: chlorhexidine, patient draped and local infiltrate  Indications  A-line indications: hemodynamic instability  Location  A-Line location: left and radial artery  Procedure Details  Needle gauge: 18  Real-time ultrasound used to visualize both artery and vein, guide needle into the target artery, and to confirm placement. Yes  Technique: catheter over needle with integrated wire  Number of attempts: 1  Successful placement  Post-Procedure  Line sutured and dressing applied  Patient tolerated the procedure well with no immediate complications    Caleb Robinson  09/28/2020  Electronically signed by Tereasa Coop, MD at 09/28/2020  4:18 PM EDT

## 2020-09-28 NOTE — Progress Notes (Signed)
Formatting of this note might be different from the original.  ENT Flap Check Note    Flap examined at bedside    Color: Pink  Capillary refill: <3 seconds  Turgor: Good with no evidence of edema or congestion  Arterial Signal: Strong, pulsatile  Venous Signal: Triphasic     Will continue q1hr nursing and q4hr MD flap checks through 10/22 @ [place]. Cordie Grice, MD  Resident Physician  ENT - Head & Neck Surgery  09/28/20 1:31 AM    Electronically signed by Carolann Littler Cordie Grice, MD at 09/28/2020  1:31 AM EDT

## 2020-09-28 NOTE — Progress Notes (Signed)
Formatting of this note is different from the original.  STICU Progress Note:    I personally evaluated and treated this critically ill patient while in the hospital overnight.    Patient Active Problem List   Diagnosis   ? Cancer of oral cavity   ? Hypovolemic shock     Vitals:  Patient Vitals for the past 24 hrs:   BP Temp Temp src Pulse Resp SpO2   09/28/20 2000 -- 37.4 C (99.3 F) Axillary 73 15 100 %   09/28/20 1900 -- -- -- 69 13 96 %   09/28/20 1800 -- -- -- 75 16 98 %   09/28/20 1700 -- -- -- 73 15 99 %   09/28/20 1600 -- 36.9 C (98.4 F) Axillary 82 18 98 %   09/28/20 1500 -- -- -- 63 13 98 %   09/28/20 1441 -- -- -- 61 14 99 %   09/28/20 1405 -- -- -- 68 23 100 %   09/28/20 1400 -- -- -- 76 13 100 %   09/28/20 1300 -- -- -- 70 12 98 %   09/28/20 1200 -- -- -- 63 16 98 %   09/28/20 1100 -- -- -- 65 9 98 %   09/28/20 1037 -- -- -- 50 11 100 %   09/28/20 1000 (!) 100/35 -- -- 53 12 96 %   09/28/20 0945 (!) 116/37 -- -- 54 11 100 %   09/28/20 0915 119/41 -- -- 52 13 100 %   09/28/20 0900 (!) 89/32 -- -- (!) 49 11 96 %   09/28/20 0846 -- -- -- (!) 49 12 98 %   09/28/20 0800 103/44 36.3 C (97.3 F) Axillary (!) 49 12 97 %   09/28/20 0700 142/47 -- -- 50 14 99 %   09/28/20 0600 107/44 -- -- (!) 48 14 100 %   09/28/20 0500 131/55 -- -- 53 13 100 %   09/28/20 0424 -- -- -- 53 15 100 %   09/28/20 0400 101/50 36.3 C (97.3 F) Axillary 53 15 100 %   09/28/20 0300 93/48 -- -- 56 15 100 %   09/28/20 0200 125/53 -- -- 56 15 100 %   09/28/20 0100 112/49 -- -- 58 15 99 %   09/28/20 0000 123/50 36.2 C (97.2 F) Axillary 61 14 100 %   09/27/20 2300 133/57 -- -- 66 16 100 %   09/27/20 2200 137/48 -- -- 63 16 100 %   09/27/20 2137 -- -- -- 60 16 92 %   09/27/20 2100 111/54 -- -- 62 18 91 %     I/O:  I/O last 3 completed shifts:  In: 11768.57 [I.V.:3620.57; VZDGL:875; NG/GT:105; IV Piggyback:7600]  Out: 6433 [IRJJO:8416; Other:495; Blood:400]    Meds Scheduled:  ? acetaminophen  650 mg Per OG/NG Tube 4 times per day   ?  aspirin  325 mg Per OG/NG Tube Daily   ? bacitracin zinc   Topical BID   ? celecoxib  200 mg Oral BID   ? docusate sodium  100 mg Per G Tube BID   ? [START ON 09/29/2020] enoxaparin  40 mg Subcutaneous Q24H   ? famotidine  20 mg Oral BID   ? gabapentin  300 mg Per OG/NG Tube 3 times per day   ? insulin aspart  1-5 Units Subcutaneous 6 times per day   ? ipratropium-albuterol  3 mL Inhalation Q6H [place]   ? melatonin  5 mg Oral QHS   ?  mirtazapine  15 mg Per OG/NG Tube QHS   ? potassium and sodium phosphates  2 packet Per OG/NG Tube AC & HS   ? prenatal vitamin  1 tablet Per OG/NG Tube Daily   ? protein supplement  30 mL Oral BID   ? rosuvastatin  10 mg Oral Daily   ? sennosides  8.8 mg Per G Tube BID   ? thiamine  100 mg Per OG/NG Tube Daily     Meds PRN:  dextrose, dextrose, glucagon, glucose, HYDROmorphone, ipratropium-albuterol, ondansetron, oxycodone, promethazine    Labs:  Recent Labs   Lab 09/27/20  1946 09/28/20  0333 09/28/20  0334 09/28/20  1114   WBC  --  16.91*  --  19.45*   HGB  --  9.2*  --  8.5*   HCT  --  26.9*  --  24.3*   PLT  --  168  --  175   NA  --  138.0  --  138.0   K  --  4.3  --  4.7   CL   < > 108 103 108   CREATININE   < > 0.7 0.7 0.6*   BUN   < > 8 9 9     < > = values in this interval not displayed.     Physical Examination: General appearance - alert, well appearing, and in no distress  Mental status - alert, oriented to person, place, and time  Neck - Bilateral neck incision with sutures, JP x3 with serosanguinous fluid, Trach    Chest - Diminished in bases  Heart - normal rate, regular rhythm, normal S1, S2, no murmurs, rubs, clicks or gallops  Abdomen - soft, nontender, nondistended, no masses or organomegaly  GU Male - Foley  Neurological - GCS 11T, writing on paper to communicate  Musculoskeletal - Left low leg boot with JP x 1 and On-Q, Left thigh donor site with On-Q  Extremities - peripheral pulses normal, no pedal edema, no clubbing or cyanosis  Skin - See above incision sites      A/P: Caleb Urwin McConnellis a 69 y.o.year old malead[facility] flap checks and pressor management s/p oral resection for SCC.    Respiratory Failure - Weaned off of ventilator. Tolerating trach collar. Continue aggressive pulmonary toilet. History of COPD, scheduled and PRN Duo-nebs ordered.     Hypovolemic shock- Physiologic MAP goals. EBL 41ml. Requiring Neo-synephrine drip post-operatively. Weaned off this AM, but restarted due to hypotension. Positive passive leg elevation, and patient given 563ml LR bolas and Albumin 25G/5% x 2. Continue volume resuscitation.     Anemia (acute blood loss) - will monitor hemoglobin level and follow hemodynamic status for signs of hemorrhage.  Will consider transfusion for hemoglobin between 7 and 8 or for signs of hemorrhage.    Hyperglycemia - Control blood sugar.  Increase intensity of therapy for blood sugar > 140.  If sustained high, insulin drip is needed.    Malnutrition - Continue full nutritional support via enteric feeds.    The patient is critically ill with a high risk of complications due to the problems/condition(s) documented above.      My total direct critical care time spent on this patient's care today was 30 minutes, during which time period the patient was at significant risk of physiologic decompensation due to illness.  This includes my review of recent events and diagnostic results, my direct examination and assessment time, my rounding with the multidisciplinary ICU  team including discussion of care goals and plans, as well as my discussion with the patient/family and other consultants.  It does not include time for procedures billed separately.     RISA LINFORD, NP, [place] [facility] [place]  Department of Surgery  Pager: [phone]  Date: 09/28/20  Time: 8:09 PM       Electronically signed by Christoper Fabian at 09/28/2020  8:22 PM EDT

## 2020-09-28 NOTE — Progress Notes (Signed)
Formatting of this note is different from the original.    Adult Malnutrition [place] [facility] [place]  69 y.o.  male  [id number]  09/28/2020  09/27/2020    Admission Diagnosis:   1. Hypovolemic shock    2. Cancer of oral cavity      Malnutrition:  Criteria must include 2 diagnostic parameters     Acute Illness or Injury Chronic Illness Social or Environmental   Energy Intake   < or equal 75% energy need> or equal 1 month (severe)     Weight Loss   >7.5% in 3 months (severe)     SubQ Fat Loss         Muscle Loss         Fluid Accumulation         Grip Strength           The patient meets the above criteria for severe malnutrition.  If you agree, please co-sign note and document in the progress notes and DC summary including treatment provided. Thank you.    Bedelia Person, RD-AP, LD, CNSC  Pager ID: [phone]    By signing this note, I agree with the Dietitian's assessment of malnutrition, including type and validate this as a diagnosis.    Electronically signed by Tereasa Coop, MD at 09/28/2020  4:18 PM EDT

## 2020-09-28 NOTE — Progress Notes (Signed)
Formatting of this note might be different from the original.  For all Team C patient questions from 6am-5pm during the week please contact the Team C intern at [phone] or their pager. On weekdays between 5pm-6am and weekends please first attempt to contact the intern on call at [phone] or, secondarily, the junior ENT resident on call.    Otolaryngology--Head and Neck Surgery Progress Note    Patient Name: Caleb Robinson  Date: 09/28/2020  Admission Date: 09/27/2020  Service: Otolaryngology--Head and Neck Surgery    Subjective:   AFVSS. On Precedex and Neo, weaning. On 10 L FiO2 40% via TC.    Objective:  General: NAD  HEENT: supple neck with midline trachea, surgical incision sites c/d/i, JP in place to bulb suction with ss output. Intraoral flap pink with good turgor, strong A/V signals, no evidence of congestion. 6-0 DCT, some expected postoperative edema. DHT.   CV: pulses intact, regular rate  Resp: no increased work of breathing  Abd: soft, ND  Ext: no cyanosis or swelling or rashes. Donor site under CAM boot, wound vac in place.    Neuro: CN II-XII intact    Assessment:   69 y.o. male with PMH of anterior OC SCCa, active smoker with COPD who is 1 Day Post-Op s/p [name] w/  now s/p Comp OC w/ segmental mandibulectmy, partial gloss, b/l ND, trach, L fibula free flap, STSG. Vessels: L Facial A, retromandibular V.     Neuro:   - Multimodal pain control    HEENT:  - No pillows, trach ties around neck  - Routine JP care: Please strip JP q1-2h. Empty and record output q4h and prn. Notify MD for significantly increasing output or significant change in color/consistency   -q1h RN flap checks while in ICU, q2h while in HDU  - q4h ENT MD flap checks until 10/22 @630pm . Flap check with no issues.   - Bacitracin ointment to incisions BID until POD#3. POD#4 and after clean incision with a 50/50 mixture of hydrogen peroxide and saline, and apply Vaseline ointment to incisions BID    CV/Heme:  - Continue tele  - Goal MAP  goal physiologic  - Notify MD if Systolic blood pressure > 170.   - Notify MD for sustained pulse > 110 while pain is well controlled  - ASA 325    Pulm:   - Wean O2 to keep sats >92%    FENGI:  - Replete electrolytes to keep K >4.0, Mg>2.0  - IVF per STICU  - Diet NPO diet OTHER (COMMENTS)  - Bowel regimen senna/colace    GU:  - Continue foley for close I/O monitoring  - Notify MD if UOP <30cc/h    ID:   - Unasyn for post-surgical ppx    ENDO:   - SSI as appropriate    PPx:   - DVT: LVX 30 BID  - GI: H2     MSK:   -Will follow PT/OT recs when appropriate to work with.    Dispo  - ICU  - Pending resolution of above issues    Bartholomew Boards MD [facility]  PGY-2, Otolaryngology [facility] [place] [facility] [place]   Pager: [phone]      Electronically signed by Arelia Sneddon, MD at 09/28/2020  6:34 AM EDT

## 2020-09-28 NOTE — Progress Notes (Signed)
Formatting of this note might be different from the original.  ENT Flap Check Note    Flap examined at bedside. Patient on 30 of neo.     Color: Pink  Capillary refill: <3 seconds  Turgor: Good with no evidence of edema or congestion  Arterial Signal: Strong, pulsatile  Venous Signal: Triphasic     Will continue q1hr nursing and q4hr MD flap checks through 10/22 @ [place], MD  Otolaryngology PGY-3  [place] [facility] [place]  Pager ID: [phone]    Electronically signed by Samantha Crimes, MD at 09/29/2020 12:00 AM EDT

## 2020-09-28 NOTE — Progress Notes (Signed)
Formatting of this note might be different from the original.  ENT Flap Check Note    Flap examined at bedside.    Color: Pink  Capillary refill: <3 seconds  Turgor: Good with no evidence of edema or congestion  Arterial Signal: Strong, pulsatile  Venous Signal: Triphasic     Will continue q1hr nursing and q4hr MD flap checks through 10/22 @ [place], MD  Otolaryngology PGY-3  [place] [facility] [place]  Pager ID: [phone]    Electronically signed by Samantha Crimes, MD at 09/28/2020  8:43 PM EDT

## 2020-09-29 LAB — HEMOGLOBIN A1C
Estimated Avg Glucose, External: 88 mg/dL (ref 77–114)
Hemoglobin A1C, External: 4.7 % (ref 4.3–5.6)

## 2020-09-29 NOTE — Progress Notes (Signed)
Formatting of this note might be different from the original.  Post-Rounding Updates/Changes    - D/c foley, void trial   - Currently off neo   - 2u pRBC today for hgb 6.6   - Repeat CBC after blood transfusion   - Increase TF to goal today   - D/c IVF   - If he needs fluids, give albumin    Erling Conte, DO  PGY-1  Pager: [phone]      Electronically signed by Erling Conte, MD at 09/29/2020  9:29 AM EDT

## 2020-09-29 NOTE — Progress Notes (Signed)
Formatting of this note might be different from the original.  ENT Flap Check Note    Flap examined at bedside. Off pressors, on trach collar. Arrived on HDU unit.     Color: Pink  Capillary refill: <3 seconds  Turgor: Good with no evidence of edema or congestion  Arterial Signal: Strong, pulsatile  Venous Signal: Pulsatile and strong    Will continue q1hr nursing and q4hr MD flap checks through 10/22 @ [place]  PGY-2, Otolaryngology [facility] [place] [facility] [place]   Pager: [phone]      Electronically signed by Arelia Sneddon, MD at 09/29/2020  7:48 PM EDT

## 2020-09-29 NOTE — Progress Notes (Signed)
Formatting of this note is different from the original.  Physical Therapy Inpatient Evaluation  [facility]    Patient Name: Caleb Robinson    Age: 69 y.o.    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 Post-op composite oral cavity resection, segmental mandibulectomy, bilateral ND with left fibula free flap 10/20.    PMH:  Patient  has no past medical history on file.  PSH:  Patient  has no past surgical history on file.  Review of patient's allergies indicates:   Allergen Reactions   ? Cat dander Swelling     Occupation/ Work Status: Works but unable to verbalize where   Living Situation: Lives at home with friend in [facility] no steps to enter   Equipment Owned: None  Prior Level of Function/Baseline: Fully I with all activities and using no Ads     Precautions: A-line, Central line, Drain, Fall risk, Free flap, Incision / suture site, IV, NGT, Oxygen, Skin graft, Telemetry, and Tracheostomy    Barriers to Learning: No known limitations    Subjective     Subjective: Pt was unable to verbalize but very pleasant with hand gestures and agreeable to therapy   Patient's Goals: to return home     Pain:  Pre-treatment: other (see comments)  Post-treatment: other (see  comments)  Pain at fib fla on R leg     Objective     Cognitive Status  Orientation: Oriented x4  Level of Consciousness: Cooperative  Follows Commands: 100% of the time  Safety and Judgment: Intact    General ROM:  WFL    MMT Details:   [facility]    Balance Skills Assessment:  Static sitting:  Normal balance  Dynamic sitting:  Normal balance    Sensation:    Left LE:  WNL  Right LE:  WNL    Treatment Performed:    Bed Mobility   -Rolling: Pt was HHA for rolling in bed  -Supine to sit: HHA to come upright with tactile cues to bring LE to EOB and assistance given through hands   -Sit to supine: SBA with verbal cues to guide trunk placement back into bed     Balance: Goo static and dynamic seated balance able to demonstrate ADL care while seated    Education: Pt was educated on PT POC to progress independence and functional mobility back to baseline levels     Pt left in bed c all needs met, call bell in reach, RN aware, bed alarm intact and audible.    [facility] Inpatient Basic Mobility Short Form  How much difficulty does the patient currently have...  1.  Turning over in bed (including adjusting bedclothes, sheets and blankets)? A Little  2. Sitting down on and standing up from a chair with arms (e.g., wheelchair, bedside commode, etc.) A Lot  3. Moving from lying on back to sitting on the side of the bed? A Little    How much help from another person does the patient currently need...  4. Moving to and from a bed to a chair (including a wheelchair)? A Lot  5. Need to walk in hospital room? A Lot  6. Climbing 3-5 steps with a railing? Unable    Raw Score: 13  T-score: 36.74              Assessment     Assessment: Caleb Robinson is a 69 y.o male newly diagnosed with FOM lesion, cervical node, and lung nodules on PET scan. Post-op composite oral cavity resection, segmental mandibulectomy, bilateral ND with left fibula free flap 10/20. Pt was I, employed, and living in [facility] with friend and no steps to enter at baseline. Upon  eval Pt requires little to no assistance for all levels of bed mobility with good strength in the UE/LE and intact sensation in the LE. Pt is still limited by extensive lines/leads, Hx of COPD, and acute post-op pain. Continue to progress pt's functional endurance and restore independence. Rec home with assist and HHPT.     Recommendations:  Discharge Disposition: home with assist, home with home health  Equipment Needed: other (see comments)    Goals     Short Term Goals:  Pt will be I with all levels of bed mobility by 10/31  Pt will go from supine in bed to OOB with STS I by 10/22  Pt will ambulate 100' with SPV by 10/31    Long Term Goal  Pt will ambulate 500' independently by 11/10    Plan     Frequency: 4-6 times per week   Predicted Duration of Therapy Intervention: 4 weeks    Interventions: Gait training therapy - 97116, Therapeutic activities - 97530, Therapeutic exercises - 97110    Evaluation Codes     History: Personal factors and/or comorbidities    Simple   Moderate   Complex     Moderate   None X      1-2  X     >3   X    Examination of body systems, body structures/function, activity limitations, participation restrictions   Simple   Moderate   Complex     Moderate   Addressing 1 to 2 elements X      Addressing 3 or more elements  X     Addressing 4 or more elements   X      Clinical presentation    Simple   Moderate   Complex     Moderate   Stable X      Evolving  X     Unstable   X      Therapist: Altha Harm     I was present in the room and guided service delivery when the student was participating in the provision of services, and was not engaged in treating any other patients or any other task at the time of service delivery. I am responsible for the plan of care and assessment for this patient.     Therapist: Lorelee Market, PT, DPT  Pager ID: [phone]      Electronically signed by Lorelee Market, PT, DPT at 09/29/2020  2:00 PM EDT

## 2020-09-29 NOTE — Progress Notes (Signed)
Formatting of this note might be different from the original.  ENT Flap Check Note    Flap examined at bedside. Off pressors, on trach collar. Transferring to HDU unit.     Color: Pink  Capillary refill: <3 seconds  Turgor: Good with no evidence of edema or congestion  Arterial Signal: Strong, pulsatile  Venous Signal: Pulsatile and strong    Will continue q1hr nursing and q4hr MD flap checks through 10/22 @ [place]  PGY-2, Otolaryngology [facility] [place] [facility] [place]   Pager: [phone]      Electronically signed by Arelia Sneddon, MD at 09/29/2020  4:04 PM EDT

## 2020-09-29 NOTE — Unmapped (Signed)
Formatting of this note is different from the original.  STICU TRANSFER SUMMARY    Caleb Robinson  DOB: [birth year]  MRN: [id number]    Admission Date: 09/27/2020  Transfer Date: 09/29/2020    STICU Attending: Orlene Erm, MD  Admitting Service: STICU    Transfer Location: HDU  Transfer Attending: Elisha Headland, MD  Transfer Service: ENT Team C    History of Present Illness  Caleb Robinson is a 69 y.o male newly diagnosed with FOM lesion, cervical node, and lung nodules on PET scan. Symptoms started approximately 3-4 months ago with mild tenderness under tongue, progressing to left tender neck mass. He has a significant past medical history of COPD and currently still smokes. Unknown biopsy timing. Patient underwent composite oral cavity resection, segmental mandibulectomy, bilateral neck dissection with left fibula free flap, STSG, tracheostomy, and total odontotomy.   He presents to the ICU on Neo-synephrine drip for hypotension, and Precedex drip for sedation. No required MAP goals.    Past Medical History  History reviewed. No pertinent past medical history.  Past Surgical History  History reviewed. No pertinent surgical history.  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 Admitted to STICU for hypotension requiring pressors and precedex gtt for sedation, no MAP goals required. Initially weaned off neo overnight, MAPs decreased requiring pressors at 4 am, now on neo @ 40. Off precedex.    10/22: bradycardic and hypotensive w/ increasing MAP requirements, Trop negative, EKG w/ sinus brady, positive leg raise test, received 1.5L fluids during the day w/ improvement in HR and MAP, decreasing pressor requirements. Again requiring increased pressors for physiologic MAP goals, got 750 cc albumin w/o improvement, IVC 1cm. Hgb 6.6 this AM, receiving 2U PRBC. Remained on trach collar overnight. Started TFs yesterday. Off neo today, stabk\le to transfer to floor.    Initial images:  XR  Chest/Upper Abdomen NG Tube Plcmnt   Final Result   IMPRESSION:     Nasoenteric feeding catheter tip overlies the stomach.     Dictated by: Caleb Cones, MD. 09/27/2020 10:30 PM     I, Caleb Dales, MD, have reviewed the study and agree with the findings in   this report.  09/28/2020 6:38 AM     XR Chest AP Portable    (Results Pending)     Tubes / Lines / Drains   Foley catheter is not in place.   Central lines are not present.   Drains are present.   Sutures / Staples / Other requiring removal are present.   Drain/Device Site 09/27/20 1336 Left anterior; lower leg 10 Fr. Terrial Rhodes #1 (Active)   Insertion Site clean and dry 09/29/20 1200   Drainage Characteristics/Odor serosanguineous 09/29/20 1200   Drainage Amount small 09/28/20 2100   Therapy Setting (Negative Pressure Wound Therapy) bulb suction 09/29/20 1200   General Output (mL) 6 09/29/20 1200     Drain/Device Site 09/27/20 1703 Right anterior neck 10 Fr. Terrial Rhodes #2 (Active)   Insertion Site clean and dry 09/29/20 1200   Drainage Characteristics/Odor serosanguineous 09/29/20 1200   Drainage Amount moderate 09/28/20 2100   Therapy Setting (Negative Pressure Wound Therapy) bulb suction 09/29/20 1200   General Output (mL) 9 09/29/20 1200     Drain/Device Site 09/27/20 1704 Left anterior neck 10 Fr. Terrial Rhodes #3 (Active)   Insertion Site clean and dry 09/29/20 1200   Drainage Characteristics/Odor serosanguineous 09/29/20 1200   Drainage Amount small 09/28/20 2100   Therapy Setting (  Negative Pressure Wound Therapy) bulb suction 09/29/20 1200   General Output (mL) 6 09/29/20 1200     Drain/Device Site 09/27/20 1704 Right anterior neck 10 Fr. Terrial Rhodes #4 (Active)   Insertion Site clean and dry 09/29/20 1200   Drainage Characteristics/Odor serosanguineous 09/29/20 1200   Drainage Amount small 09/28/20 1900   Therapy Setting (Negative Pressure Wound Therapy) bulb suction 09/29/20 1200   General Output (mL) 8 09/29/20 1200     Naso/Oral  Tube 09/27/20 1900 small diameter; nasogastric right nostril (Active)   Securement nasal bridle 09/29/20 1200   Placement Check verified by tube secured (cm) 09/29/20 1200   Tolerance no adverse signs/symptoms 09/28/20 0400   Clamp Status/Tolerance clamped 09/28/20 0800   Insertion Site Appearance no redness; no warmth; no tenderness; no skin breakdown; no drainage 09/29/20 1200   Flush/Irrigation flushed with; water 09/28/20 2100   Tubing Change 09/28/20 09/28/20 1000   General Intake (mL) 100 09/29/20 1300   General Output (mL) 0 09/29/20 1300   Tube Feeding Intake (mL) 45 09/29/20 1300         Current Diet  NPO diet OTHER (COMMENTS)  Nutren 1.5  Peptamen Intense VHP    Scheduled Medications  ? acetaminophen  650 mg Per OG/NG Tube 4 times per day   ? aspirin  325 mg Per OG/NG Tube Daily   ? bacitracin zinc   Topical BID   ? budesonide-formoterol  2 puff Inhalation BID   ? celecoxib  200 mg Oral BID   ? docusate sodium  100 mg Per G Tube BID   ? enoxaparin  40 mg Subcutaneous Q24H   ? famotidine  20 mg Oral BID   ? gabapentin  300 mg Per OG/NG Tube 3 times per day   ? insulin aspart  1-5 Units Subcutaneous 6 times per day   ? ipratropium-albuterol  3 mL Inhalation Q6H [place]   ? melatonin  5 mg Oral QHS   ? mirtazapine  15 mg Per OG/NG Tube QHS   ? potassium and sodium phosphates  2 packet Per OG/NG Tube AC & HS   ? prenatal vitamin  1 tablet Per OG/NG Tube Daily   ? protein supplement  30 mL Oral BID   ? rosuvastatin  10 mg Oral Daily   ? sennosides  8.8 mg Per G Tube BID   ? thiamine  100 mg Per OG/NG Tube Daily     Infusions  ? phenylephrine (Neo-Synephrine) 160 mcg/mL in NaCl 0.9% 250 mL infusion Stopped (09/29/20 0900)   ? ropivacaine (PF) (NAROPIN) 0.2 % in NaCl 0.9% 400 mL ON-Q (PAINBUSTER) irrigation pump 5 mL/hr (09/27/20 1939)   ? ropivacaine (PF) (NAROPIN) 0.2 % in NaCl 0.9% 750 mL ON-Q (PAINBUSTER SELECT-A-FLOW) irrigation 10 mL/hr (09/27/20 1939)     PRN Medications  dextrose, 0-250 mL/hr, On  Call  dextrose, 75-150 mL, On Call  glucagon, 1 mg, On Call  glucose, 16 g, On Call  HYDROmorphone, 0.2-0.4 mg, Q2H PRN  ipratropium-albuterol, 3 mL, Q4H PRN  ondansetron, 8 mg, Q8H PRN  oxycodone, 5-10 mg, Q4H PRN  promethazine, 12.5 mg, Q6H PRN    Pertinent Labs  Lab Results   Component Value Date    WBC 15.71 (H) 09/29/2020    HGB 6.6 (L) 09/29/2020    HCT 19.4 (L) 09/29/2020    MCV 95.1 (H) 09/29/2020    PLT 138 (L) 09/29/2020     Lab Results   Component Value Date  NA 144.0 09/29/2020    K 3.5 09/29/2020    CL 111 (H) 09/29/2020    CO2CT 27 09/29/2020    BUN 10 09/29/2020    CREATININE 0.7 09/29/2020    CALCIUM 7.8 (L) 09/29/2020    MG 2.0 09/29/2020     Lab Results   Component Value Date    BILITOT 0.3 09/27/2020    AST 21 09/27/2020    ALT 16 09/27/2020    ALKPHOS 74 09/27/2020    PROT 5.5 (L) 09/27/2020     Lab Results   Component Value Date    GLUCOSE 92.0 09/29/2020    GLUCOSE 139.0 (H) 09/28/2020    GLUCOSE 184.0 (H) 09/28/2020    GLUCOSE 126.0 (H) 09/27/2020    GLUCOSE 129.0 (H) 09/27/2020     Lab Results   Component Value Date    INR 1.24 (H) 09/27/2020     Hemoglobin A1C (%)   Date Value   09/29/2020 4.7   09/12/2020 5.0     CBC Trend  White Blood Cell Count (K/cumm)   Date Value   09/29/2020 15.71 (H)   09/28/2020 19.45 (H)   09/28/2020 16.91 (H)   09/27/2020 20.48 (H)   09/27/2020 8.86   09/12/2020 9.29     Hemoglobin (gm/dL)   Date Value   09/29/2020 6.6 (L)   09/28/2020 8.5 (L)   09/28/2020 9.2 (L)   09/27/2020 10.2 (L)   09/27/2020 13.0 (L)   09/12/2020 15.5     Recent Imaging   No results found.    Current Diagnoses  Patient Active Problem List    Diagnosis Date Noted   ? Hypovolemic shock    ? Cancer of oral cavity 09/19/2020     Assessment and Plan  Caleb Robinson [facility] a 69 y.o.year [facility] and pressor management s/p oral resection for SCC.    Plan:  Neuro - Pain control: multimodal pain control w/ gabapentin, tylenol, oxy/dilaudid prn,  On-Q ropivacaine on L leg donor site  - Sedation: precedex off  - Melatonin QHs    #Depression  - home Remeron started   HEENT -# Oral SCC s/p resection  - Q1hr flap checks  - ASA, celebrex  - s/p Unasyn x24 hrs  - no MAP goal or HOB goal   CV # Hypotension  - Pressors: increasing neo requirements, currently off, s/p 2.25L fluid bolus over past 24 hrs w/ minimal improvement, IVC 1, likely hypovolemic vs hemorrhagic shock, receiving 2U PRBC this AM and resusciation as needed  - MAP goal physiologic  - ASA 325    # HTN  - hold home amlodipine in setting of hypotension    # HLD  - crestor restarted   Resp - Trach collar, satting 100%  - Pulm toilet    # COPD  - sch duonebs and prn   FEN/GI - NPO diet OTHER (COMMENTS)  Nutren 1.5   - Tube feeds: increase tube feeds to goal 40 mL/hr today  - d/c'd  - Replete lytes per protocol  - Bowel regimen  - Anti-emetics PRN   GU - Strict I/Os  - d/c foley  - Uop 1.7L      Recent Labs     09/28/20  0333 09/28/20  0334 09/28/20  1114 09/29/20  0534   CREATININE 0.7 0.7 0.6* 0.7      Endo          Recent Labs  09/27/20  2346 09/28/20  0502 09/28/20  0759 09/28/20  1549 09/28/20  1952 09/29/20  0330   GLUCOSEPCXWH 145.0* 153.0* 143.0* 105.0* 105.0* 100.0     - SSI#1   MSK - OOB as tolerated  - On-Q ropivacaine on L leg donor site  - Maintain L lower leg in boot   Skin - Routine wound/skin care   Heme        Recent Labs     09/27/20  1948 09/28/20  0333 09/28/20  1114 09/29/20  0534   HGB 10.2* 9.2* 8.5* 6.6*     - downtrending Hgb  - receiving 2U PRBC this AM  - Continue to monitor. Transfuse for Hgb <7 or signs of active bleeding.   ID        Recent Labs     09/27/20  1948 09/28/20  0333 09/28/20  1114 09/29/20  0534   WBC 20.48* 16.91* 19.45* 15.71*     - s/p Unasyn x24 hrs per ENT   PPx - DVT: SCDs, Lovenox 40 mg qd  - GI: Pepcid   LDAs PIVs  Foley: remove today  D/c a-line   Dispo - STICU, transfer to HDU     Judieth Keens, MD  Neurosurgery  09/29/20  Pager:  [phone]      Electronically signed by Judieth Keens, MD at 09/29/2020  1:54 PM EDT

## 2020-09-29 NOTE — Progress Notes (Signed)
Formatting of this note is different from the original.  [facility]    Patient Name: Caleb Robinson    Age: 69 y.o.    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 Symptoms started approximately 3-4 months ago as some mild tenderness underneath his tongue, which he initially attributed to his dentures. Then approximately 2 months ago began to have an enlarging left tender neck mass. Over the past month his endorses more tongue restriction of motion, and more tenderness. Also has started to have numbness of his tongue (both sides), and the front of his lower teeth. Had a CT 5/26, which was remarkable for R lvl II 1.5cm node per OSH read. Also had a PET remarkable for hypermetabolic FOM lesion, cervical nodes, and nodules in the lungs. Denies dysphagia, odynophagia, weight loss. Of note, patient states that "you can take anything off of me that you need to, but leave me my tongue and my hair"     PMH:  Patient  has no past medical history on file.  PSH:  Patient  has no past surgical history on file.  Review of patient's allergies indicates:   Allergen Reactions   ? Cat dander Swelling     Occupation/ Work  Status: working full time   Living Situation: lives c friends in [facility], no STE   Equipment Owned: none  Prior Level of Function/Baseline: independent in ADL's/iADL's    Precautions: A-line, Drain, Fall risk, Foley catheter, Free flap, Incision / suture site, IV, Woundvac, NPO, NGT, Telemetry, and Tracheostomy,   Left LE:  TDWB until 10/25    Barriers to Learning: No known limitations    Subjective     Subjective: Pt non-verbal this date but communicated via writing and nodding head yes/no.  Patient's Goals: to go home     Pt in bed upon OT entry.     Pain:  Pre-Treatment Pain Rating:other (see comments)  Post-Treatment Pain Rating:other (see comments)  Pre/Post Treatment Pain Comment: R LE, not rated    Objective     Functional Activity Tolerance:  Pt fairly tolerated 33 min therapy session 2/2 fatigue and RR  O2 Needs: Pt on 40% 10L via trach collar  Vitals  Starting supine:  HR: 103  BP: 144/61 (93)  SpO2: 95%  RR: 29     EOB:  RR: 34    Ending supine:  HR:98  BP: 164/68 (92)  SpO2: 95%  RR: 18     Current Level of Function:   Bed Mobility: SPV  Functional transfer training: Min A  Lower body dressing: Mod A  Grooming: set-up    Cognitive Status:   Orientation:  oriented to name   Level of consciousness:  alert and aware   Follows commands and answers:  100% of the time  Personal safety and judgement:  Intact    General ROM:  B UE shoulder flexion AROM ~90* 2/2 drains   B elbow extension/flexion AROM WFL     MMT Details:   B UE 4+/5   B grip strength good     Balance Skills Assessment:  Static sitting:  good  Dynamic sitting:  fair  Unable to assess OOB 2/2 fatigue and RR    Sensation:    No c/o of numbness or tingling in B UE     Tone:  Left UE:  normal  Right UE:  normal    Fine/Gross Motor Coordination:  B UE: intact to open pinch to open package  Right UE:  intact opposition and in hand translation to write sentences    Vision:  Intact distance acuity   Pt able to visually attend to therapist throughout session      Communication: Pt able to write and nod yes/no for wants and needs    Pt left in bed with all needs met, call bell in reach, RN aware, bed alarm intact.    [facility] Inpatient Daily Activity Short Form  How much help from another person does the patient current need...  1. Putting on and taking off regular lower body clothing? A Lot  2. Bathing? A Lot  3. Toileting, including toilet, bedpan, or urinal? A Lot  4. Putting on and taking off regular upper body clothing? A Little  5. Taking care of personal grooming? A Little  6. Eating meals? A Little    Raw Score: 15  T-Score: 34.69      Patients with a T-score a 39.4 or greater indicates increased accuracy in prediction of discharge to home.  Jette et al.     Treatment Performed:     ADL Training:   LB dressing (socks): Mod A, sitting EOB, R LE only, via knee flexion, pt reported fatigue prior to task    Functional Transfer Training:   Supine<>long-sit x4: SPV, HOB ~30*, unsupported in B UE  Supine<>EOB: min A, HOB ~30*, assist for trunk control, min VC for task sequencing   Scooting EOB: SPV, HOB flat, min VC for weight shifting    Cognitive Skills Intervention:   Pt answered 1/1 problem solving questions correctly via yes/no questions    Patient Family Education:   OT role and OT POC   Education on AROM of B UE to 90* of flexion     Assessment     Assessment: Pt is cooperative to participate in therapy session. Pt demonstrated unsupported supine<>long-sit at SPV assist and supine<>EOB c Min assist. Pt demonstrated L LE LB dressing c SPV assist, but reported fatigue after and increased RR so R LE deferred. Pt presents c pain, generalized muscle weakness, impaired balance, and decreased tolerance for functional activities and transfers. Pt would benefit from skilled OT to address deficits. Acute OT to follow pt.   Rec: home c assist pending medical progression . DME needs: CTA    Recommendations:  Discharge Disposition: home with assist (pending medical progression  )  Equipment Needed:  (CTA)    Goals     Short Term Goals:   by 11/5, pt will demonstrate 3 standing grooming tasks SPV adhering to L LE TDWB precaution until 10/25  by  11/5, pt will demonstrate BSC tf SPV adhering to L LE TDWB precaution until 10/25  by 11/5, pt will demonstrate LB dressing SPV adhering to L LE TDWB precaution until 10/25          Long Term Goals:   by 11/19, pt will be mod I in morning ADL routine adhering to L LE TDWB precaution until 10/25        Plan     Treatment Interventions: Self care mngment training - 73419, Therapeutic activities - 97530, Therapeutic exercises - 97110  Therapy frequency: 1-3 times per week  Estimated duration: 4 weeks  Patient participation in goal selection:    Goals reviewed with patient, and patient agrees.     Therapist: Val Riles, OTS  Pager ID: [phone]    I was present in the room and guided service delivery when the student was participating in the provision of services, and was not engaged in treating any other patients or any other task at the time of service delivery.  I am responsible for the plan of care and assessment for this patient.    Therapist: Red Christians, OT  Pager: [phone]      Electronically signed by Red Christians, OT at 09/29/2020  4:20 PM EDT

## 2020-09-29 NOTE — Progress Notes (Signed)
Formatting of this note might be different from the original.    Problem: Adult Inpatient Plan of Care  Goal: Plan of Care Review  Outcome: Ongoing, Progressing  Goal: Patient-Specific Goal (Individualization)  Outcome: Ongoing, Progressing  Goal: Absence of Hospital-Acquired Illness or Injury  Outcome: Ongoing, Progressing  Goal: Optimal Comfort and Wellbeing  Outcome: Ongoing, Progressing  Goal: Readiness for Transition of Care  Outcome: Ongoing, Progressing  Goal: Rounds/Family Conference  Outcome: Ongoing, Progressing    Electronically signed by Darden Amber, RN at 09/29/2020  6:52 PM EDT

## 2020-09-29 NOTE — Progress Notes (Signed)
Formatting of this note might be different from the original.  For all Team C patient questions from 6am-5pm during the week please contact the Team C intern at [phone] or their pager. On weekdays between 5pm-6am and weekends please first attempt to contact the intern on call at [phone] or, secondarily, the junior ENT resident on call.    Otolaryngology--Head and Neck Surgery Progress Note    Patient Name: Caleb Robinson  Date: 09/29/2020  Admission Date: 09/27/2020  Service: Otolaryngology--Head and Neck Surgery    Subjective:   AF, soft BP 80-100s, other VSS. Received 500 cc bolus, albumin x 4, and increased Neo to max 60, remain on 30 most of night. Off Precedex. On 10 L FiO2 40% via TC. Flap stable.    Objective:  General: NAD  HEENT: supple neck with midline trachea, surgical incision sites c/d/i, JP in place to bulb suction with ss output. Intraoral flap pink with good turgor, strong A/V signals, no evidence of congestion. 6-0 DCT, some expected postoperative edema. DHT.   CV: pulses intact, regular rate  Resp: no increased work of breathing on 10 L FiO2 40% via TC  Abd: soft, ND  Ext: no cyanosis or swelling or rashes. Donor site under CAM boot, wound vac in place.    Neuro: CN II-XII intact    Assessment:   69 y.o. male with PMH of anterior OC SCCa, active smoker with COPD who is 2 Days Post-Op s/p 69yo M w/  now s/p Comp OC w/ segmental mandibulectmy, partial gloss, b/l ND, trach, L fibula free flap, STSG. Vessels: L Facial A, retromandibular V.     Neuro:   - Multimodal pain control    HEENT:  - No pillows, trach ties around neck  - Routine JP care: Please strip JP q1-2h. Empty and record output q4h and prn. Notify MD for significantly increasing output or significant change in color/consistency   -q1h RN flap checks while in ICU, q2h while in HDU  - q4h ENT MD flap checks until 10/22 @630pm . Flap check with no issues.   - Bacitracin ointment to incisions BID until POD#3. POD#4 and after clean incision  with a 50/50 mixture of hydrogen peroxide and saline, and apply Vaseline ointment to incisions BID    CV/Heme:  - Continue tele  - Goal MAP goal physiologic  - Notify MD if Systolic blood pressure > 170.   - Notify MD for sustained pulse > 110 while pain is well controlled  - ASA 325    Pulm:   - Wean O2 to keep sats >92%    FENGI:  - Replete electrolytes to keep K >4.0, Mg>2.0  - IVF per STICU  - Diet NPO diet OTHER (COMMENTS)  Nutren 1.5   - Advance TF towards goal   - Bowel regimen senna/colace    GU:  - Continue foley for close I/O monitoring  - Notify MD if UOP <30cc/h    ID:   - Unasyn for post-surgical ppx    ENDO:   - SSI as appropriate    PPx:   - DVT: LVX 30 BID  - GI: H2     MSK:   - Recommend pt up and out of bed today with PT   -Will follow PT/OT recs when appropriate to work with.    Dispo  - ICU  - Pending resolution of above issues    Bartholomew Boards MD [facility]  PGY-2, Otolaryngology Head and [place]  [facility] [place]   Pager: [phone]      Electronically signed by Arelia Sneddon, MD at 09/29/2020  7:02 AM EDT

## 2020-09-29 NOTE — Progress Notes (Signed)
Formatting of this note might be different from the original.    Problem: Adult Inpatient Plan of Care  Goal: Plan of Care Review  Outcome: Ongoing, Progressing  Goal: Optimal Comfort and Wellbeing  Outcome: Ongoing, Progressing    Electronically signed by Neta Mends, RN at 09/29/2020 10:59 PM EDT

## 2020-09-29 NOTE — Progress Notes (Signed)
Formatting of this note might be different from the original.  ENT Flap Check Note    Flap examined at bedside. Patient remains on 30 of neo.     Color: Pink  Capillary refill: <3 seconds  Turgor: Good with no evidence of edema or congestion  Arterial Signal: Strong, pulsatile  Venous Signal: Pulsatile and strong    Will continue q1hr nursing and q4hr MD flap checks through 10/22 @ [place], MD  Otolaryngology PGY-3  [facility] [place]  Pager ID: [phone]    Electronically signed by Samantha Crimes, MD at 09/29/2020  4:31 AM EDT

## 2020-09-29 NOTE — Progress Notes (Signed)
Formatting of this note might be different from the original.  ENT Flap Check Note    Flap examined at bedside. Off pressors, on trach collar.     Color: Pink  Capillary refill: <3 seconds  Turgor: Good with no evidence of edema or congestion  Arterial Signal: Strong, pulsatile  Venous Signal: Pulsatile and strong    Will continue q1hr nursing and q4hr MD flap checks through 10/22 @ [place]  PGY-2, Otolaryngology Head and [place] [facility] [place]   Pager: [phone]      Electronically signed by Arelia Sneddon, MD at 09/29/2020 11:52 AM EDT

## 2020-09-29 NOTE — Progress Notes (Signed)
Formatting of this note might be different from the original.    Problem: Adult Inpatient Plan of Care  Goal: Plan of Care Review  Outcome: Ongoing, Progressing  Goal: Patient-Specific Goal (Individualization)  Outcome: Ongoing, Progressing  Goal: Absence of Hospital-Acquired Illness or Injury  Outcome: Ongoing, Progressing  Goal: Optimal Comfort and Wellbeing  Outcome: Ongoing, Progressing    Electronically signed by Kingsley Callander, RN at 09/29/2020  8:55 AM EDT

## 2020-09-29 NOTE — Progress Notes (Signed)
Formatting of this note is different from the original.  STICU / Adult Surgical Critical Care Progress Note  [facility]    Patient Name: Caleb Robinson  Age: 69 y.o.  Sex: male  MRN: [id number]  Date: September 29, 2020    Hospital day: 2  Primary Diagnosis: <principal problem not specified>    Admitting Physician: Junie Panning, MD  Current Attending Physician: Jerrye Beavers Day, MD    Code Status: Full code  Medical decision maker: Adult Child (Name: Guadalupe Maple)    Brief HPI:  Caleb Robinson is a 69 y.o male newly diagnosed with FOM lesion, cervical node, and lung nodules on PET scan. Symptoms started approximately 3-4 months ago with mild tenderness under tongue, progressing to left tender neck mass. He has a significant past medical history of COPD and currently still smokes. Unknown biopsy timing. Patient underwent composite oral cavity resection, segmental mandibulectomy, bilateral neck dissection with left fibula free flap, STSG, tracheostomy, and total odontotomy.   He presents to the ICU on Neo-synephrine drip for hypotension, and Precedex drip for sedation. No required MAP goals.    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 Admitted to STICU for hypotension requiring pressors and precedex gtt for sedation, no MAP goals required. Initially weaned off neo overnight, MAPs decreased requiring pressors at 4 am, now on neo @ 40. Off precedex.    10/22: bradycardic and hypotensive w/ increasing MAP requirements, Trop negative, EKG w/ sinus brady, positive leg raise test, received 1.5L fluids during the day w/ improvement in HR and MAP, decreasing pressor requirements. Again requiring increased pressors for physiologic MAP goals, got 750 cc albumin w/o improvement, IVC 1cm. Hgb 6.6 this AM, receiving 2U PRBC. Remained on trach collar overnight. Started TFs yesterday.    Objective:  Physical Examination:   Vital signs:   Pulse:  Pulse  Readings from Last 1 Encounters:   09/29/20 68     Blood Pressure:   BP Readings from Last 1 Encounters:   09/28/20 (!) 100/35     Last Temp:  Temp Readings from Last 1 Encounters:   09/28/20 37.4 C (99.3 F) (Axillary)     Temp Avg/Min/Max: Temp  Avg: 36.6 C (97.8 F)  Min: 36.2 C (97.2 F)  Max: 37.4 C (99.3 F)     Resp Rate:   Resp Readings from Last 1 Encounters:   09/29/20 15     Pulse Ox:   SpO2 Readings from Last 1 Encounters:   09/29/20 96%     Intake / Output:  I/O last 3 completed shifts:  In: 11778.57 [I.V.:3620.57; UYQIH:474; NG/GT:115; IV Piggyback:7600]  Out: 4090 [QVZDG:3875; Other:495; Blood:400]     Physical Examination/Physiologic Support:  General: No apparent distress              Neuro: GCS:   Eye Opening: 2  To Pain                                      Verbal Response: 1T Unable to assess due to Intubation/Tracheostomy                                      Motor Response: 5- Localizes Stimuli  Pupils: Reactive: Bilateral                           Exam: No focal deficits              Head: Normocephalic              Eyes: PERRL              ENT: Tracheostomy:  Tracheostomy in place and secured and bilateral neck incision with JP x2              Hemodynamics: Current Cardiac Rhythm: Normal Sinus Rhythm                                          Pressor Agents in use: Neosynephrine: Stable dosage                                          Heart Exam: Rhythm:  regular              Vascular: well perfused with normal pulses in the distal extremities              Pulmonary: Diminished breath sounds: both                                      Ventilation:  Breathing spontaneously              Abdomen: Nontender and Soft              Nutrition: NPO              Renal/Genitourinary: Foley catheter in place              Musculoskeletal: Left leg boot              Skin: Other Wounds Left thigh incision site with On-Q, and left fibula incision site with JP and On-Q               Psych: Not assessed    Current Diet:   NPO diet OTHER (COMMENTS)  Nutren 1.5  Laboratory values:    Recent Results (from the past 24 hour(s))   GLUCOSE WHOLE BLOOD, POC    Collection Time: 09/28/20  7:59 AM   Result Value Ref Range    Glucose, Whole Blood 143.0 (H) 70.0 - 100.0 mg/dL   CBC    Collection Time: 09/28/20 11:14 AM   Result Value Ref Range    White Blood Cell Count 19.45 (H) 4.80 - 10.80 K/cumm    Red Blood Cell Count 2.58 (L) 4.70 - 6.10 M/cumm    Hemoglobin 8.5 (L) 14.0 - 18.0 gm/dL    Hematocrit 24.3 (L) 42.0 - 52.0 %    Mean Corpuscular Volume 94.2 (H) 80.0 - 94.0 fL    Mean Corpuscular Hemoglobin 32.9 (H) 27.0 - 31.0 pg    Mean Corpuscular Hemoglobin Conc 35.0 (H) 30.7 - 34.4 gm/dL    Red Cell Distribution Width 13.9 11.5 - 14.5 %    Platelet Count 175 140 - 440 K/cumm    Mean Platelet Volume 11.3 9.2 - 12.3 fL    Nucleated Red  Blood Cells 0.0 0.0 - 0.2 /100 WBC   BASIC METABOLIC PANEL    Collection Time: 09/28/20 11:14 AM   Result Value Ref Range    Sodium 138.0 135.0 - 145.0 mmol/L    Potassium 4.7 3.5 - 5.1 mmol/L    Chloride 108 98 - 108 mmol/L    Co2 Content 21 (L) 22 - 29 mmol/L    Anion Gap 9 2 - 11 mmol/L    Glucose 139.0 (H) 70.0 - 100.0 mg/dL    Urea Nitrogen, Blood 9 8 - 26 mg/dL    Creatinine 0.6 (L) 0.7 - 1.3 mg/dL    Egfr >60   mL/min/1.73 sq.m    Calcium 7.9 (L) 8.4 - 10.3 mg/dL   MAGNESIUM    Collection Time: 09/28/20 11:14 AM   Result Value Ref Range    Magnesium 2.1 1.6 - 2.6 mg/dL   PHOSPHORUS    Collection Time: 09/28/20 11:14 AM   Result Value Ref Range    Phosphorus 3.1 2.3 - 4.7 mg/dL   TROPONIN I    Collection Time: 09/28/20 11:15 AM   Result Value Ref Range    Troponin, High Sensitivity 6 <=27 ng/L   ARTERIAL BLOOD GAS, SHORT PANEL POC    Collection Time: 09/28/20 11:18 AM   Result Value Ref Range    Patient Temp 37.0 degC    PH (Uncorr), Arterial 7.37 7.35 - 7.45    PH (Corr), Arterial 7.37 7.35 - 7.45    PCO2 (Uncorr), Arterial 47 (H) 35 - 45 mmHg    PCO2 (Corr), Arterial  47 (H) 35 - 45 mmHg    PO2 (Uncorr), Arterial 68 (L) 80 - 100 mmHg    PO2 (Corr), Arterial 68 (L) 80 - 100 mmHg    Bicarb, Arterial 27.1 (H) 22.0 - 26.0 mmol/L    Total Co2, Arterial 27.6 (H) 23.0 - 27.0 mmol/L    Base, Arterial 1.5 -2.0 - 3.0 mmol/L    O2 Sat, Arterial 92.4 (L) 96.0 - 97.0 %   GLUCOSE WHOLE BLOOD, POC    Collection Time: 09/28/20  3:49 PM   Result Value Ref Range    Glucose, Whole Blood 105.0 (H) 70.0 - 100.0 mg/dL   PHOSPHORUS    Collection Time: 09/28/20  3:52 PM   Result Value Ref Range    Phosphorus 2.3 2.3 - 4.7 mg/dL   GLUCOSE WHOLE BLOOD, POC    Collection Time: 09/28/20  7:52 PM   Result Value Ref Range    Glucose, Whole Blood 105.0 (H) 70.0 - 100.0 mg/dL   GLUCOSE WHOLE BLOOD, POC    Collection Time: 09/29/20  3:30 AM   Result Value Ref Range    Glucose, Whole Blood 100.0 70.0 - 100.0 mg/dL   HEMOGLOBIN A1C    Collection Time: 09/29/20  5:34 AM   Result Value Ref Range    Hemoglobin A1C 4.7 4.3 - 5.6 %    Estimated Average Glucose (EAG) 88 77 - 114 mg/dL   CBC    Collection Time: 09/29/20  5:34 AM   Result Value Ref Range    White Blood Cell Count 15.71 (H) 4.80 - 10.80 K/cumm    Red Blood Cell Count 2.04 (L) 4.70 - 6.10 M/cumm    Hemoglobin 6.6 (L) 14.0 - 18.0 gm/dL    Hematocrit 19.4 (L) 42.0 - 52.0 %    Mean Corpuscular Volume 95.1 (H) 80.0 - 94.0 fL    Mean Corpuscular Hemoglobin 32.4 (H) 27.0 - 31.0  pg    Mean Corpuscular Hemoglobin Conc 34.0 30.7 - 34.4 gm/dL    Red Cell Distribution Width 14.4 11.5 - 14.5 %    Platelet Count 138 (L) 140 - 440 K/cumm    Mean Platelet Volume 11.1 9.2 - 12.3 fL    Nucleated Red Blood Cells 0.0 0.0 - 0.2 /100 WBC   BASIC METABOLIC PANEL    Collection Time: 09/29/20  5:34 AM   Result Value Ref Range    Sodium 144.0 135.0 - 145.0 mmol/L    Potassium 3.5 3.5 - 5.1 mmol/L    Chloride 111 (H) 98 - 108 mmol/L    Co2 Content 27 22 - 29 mmol/L    Anion Gap 6 2 - 11 mmol/L    Glucose 92.0 70.0 - 100.0 mg/dL    Urea Nitrogen, Blood 10 8 - 26 mg/dL    Creatinine  0.7 0.7 - 1.3 mg/dL    Egfr >60   mL/min/1.73 sq.m    Calcium 7.8 (L) 8.4 - 10.3 mg/dL   PHOSPHORUS    Collection Time: 09/29/20  5:34 AM   Result Value Ref Range    Phosphorus 2.3 2.3 - 4.7 mg/dL   MAGNESIUM    Collection Time: 09/29/20  5:34 AM   Result Value Ref Range    Magnesium 2.0 1.6 - 2.6 mg/dL     Assessment:  Caleb Robinson is a 69 y.o. year old male admitted to [facility] for hourly flap checks and pressor management s/p oral resection for SCC.     Plan:     Neuro - Pain control: multimodal pain control w/ gabapentin, tylenol, oxy/dilaudid prn, On-Q ropivacaine on L leg donor site  - Sedation: precedex off  - Melatonin QHs    #Depression  - home Remeron started   HEENT -# Oral SCC s/p resection  - Q1hr flap checks  - ASA, celebrex  - s/p Unasyn x24 hrs  - no MAP goal or HOB goal   CV # Hypotension  - Pressors: increasing neo requirements, currently @ 40, s/p 2.25L fluid bolus over past 24 hrs w/ minimal improvement, IVC 1, likely hypovolemic vs hemorrhagic shock, receiving 2U PRBC this AM and resusciation as needed  - wean neo as tolerated  - MAP goal physiologic  - ASA 325    # HTN  - hold home amlodipine in setting of hypotension    # HLD  - crestor restarted   Resp - Trach collar, satting 100%  - Pulm toilet    # COPD  - sch duonebs and prn   FEN/GI - NPO diet OTHER (COMMENTS)  Nutren 1.5   - Tube feeds: increase tube feeds to goal 40 mL/hr today  - MIVF: LR @75  mL/hr  - Replete lytes per protocol  - Bowel regimen  - Anti-emetics PRN   GU - Strict I/Os  - Foley  - Uop 1.7L    Recent Labs     09/28/20  0333 09/28/20  0334 09/28/20  1114 09/29/20  0534   CREATININE 0.7 0.7 0.6* 0.7     Endo Recent Labs     09/27/20  2346 09/28/20  0502 09/28/20  0759 09/28/20  1549 09/28/20  1952 09/29/20  0330   GLUCOSEPCXWH 145.0* 153.0* 143.0* 105.0* 105.0* 100.0     - SSI#1   MSK - OOB as tolerated  - On-Q ropivacaine on L leg donor site  - Maintain L lower leg in boot   Skin - Routine wound/skin  care   Heme  Recent Labs     09/27/20  1948 09/28/20  0333 09/28/20  1114 09/29/20  0534   HGB 10.2* 9.2* 8.5* 6.6*     - downtrending Hgb  - receiving 2U PRBC this AM  - Continue to monitor. Transfuse for Hgb <7 or signs of active bleeding.   ID Recent Labs     09/27/20  1948 09/28/20  0333 09/28/20  1114 09/29/20  0534   WBC 20.48* 16.91* 19.45* 15.71*     - s/p Unasyn x24 hrs per ENT   PPx - DVT: SCDs, Lovenox 40 mg qd  - GI: Pepcid   LDAs PIVs  Foley: keep while on pressors   Dispo - STICU     Judieth Keens, MD  Neurosurgery  09/29/20  Pager: [phone]    PLAN:     Respiratory - tolerating trach collar. COPD - contineu Duonebs.    CV - Anemia (acute blood loss) - will monitor hemoglobin level and follow hemodynamic status for signs of hemorrhage.  Will consider transfusion for hemoglobin <7 or for signs of hemorrhage.  Continue Lovenox for DVT prophylaxis.  Continue to wean / titrate Neo as able to maintain MAP > 60.    GI - Tolerating trophic tube feeds ( Nutren 1.5 @ 10 cc/hr ). Will titrate to goal as able (40 cc/hr). Continue Pepcid. Continue bowel regimen.    Renal - Will remove foley.  Void check in 6 hours and I/O cath as needed per protocol.  Hypokalemia and Hypomagnesemia - correct K+ and Mg++.    ID - no issues.    Endocrine - Good blood glucose control - not requiring insulin coverage at this time.  Continue to monitor.    Neuro - Pain control - continue multimodal therapy.    HEENT - continue flap checks.  Follow drain output and character.    I have reviewed and discussed with the house staff  the clinical course and findings above, as well as laboratory reports, X-ray reports, X-ray films, results of additional medical testing and monitored output as noted above.  I personally examined the patient and discussed care plans on rounds with the house staff and critical care team.     Patient is critically ill / injured.  I spent 32 minutes providing critical care to this patient.      Gwendolyn Grant. Milbert Coulter, MD.  Pager ID:  [phone]  Date: September 30, 2020  Time: 1:07 AM          Electronically signed by Gwendolyn Grant. Milbert Coulter, MD at 09/30/2020  1:08 AM EDT

## 2020-09-30 NOTE — Progress Notes (Signed)
Formatting of this note might be different from the original.    Problem: Adult Inpatient Plan of Care  Goal: Plan of Care Review  Outcome: Ongoing, Progressing  Goal: Patient-Specific Goal (Individualization)  Outcome: Ongoing, Progressing  Goal: Absence of Hospital-Acquired Illness or Injury  Outcome: Ongoing, Progressing  Goal: Optimal Comfort and Wellbeing  Outcome: Ongoing, Progressing  Goal: Readiness for Transition of Care  Outcome: Ongoing, Progressing  Goal: Rounds/Family Conference  Outcome: Ongoing, Progressing    Electronically signed by Darden Amber, RN at 09/30/2020  9:08 AM EDT

## 2020-09-30 NOTE — Consults (Signed)
Associated Order(s): IP CONSULT TO NUTRITION SERVICES  Formatting of this note is different from the original.  Brief Nutrition Note:     Consult received for tube feeds. Will adjust orders to reflect recommended bolus TF left on 10/21.    Enteral Nutrition (Bolus/Home)  Access: DHT tube  Formula:  Nutren 1.5  Bolus Volume: 249mL  Bolus Frequency: Q6H  Flush before and after each bolus: 85mL  Protein Modular: Prosource 60mL daily   Provides:   1560 kcal / day 36 kcal / kg / day 176 gm CHO / day   83 gm Pro/ day 1.9 gm protein / kg / day 764 mL free water     Patient's plan of care discussed during multidisciplinary rounds or with member of the care team and/or orders acknowledged by physician in [place].     Annamaria Boots, MS, RD, LD  Pager [phone]   Electronically signed by Annamaria Boots, RD at 09/30/2020  7:59 AM EDT

## 2020-09-30 NOTE — Progress Notes (Signed)
Formatting of this note might be different from the original.  MD Lynne Leader [phone] paged at 2059:  [phone]. Caleb Robinson. pt having constant diarrhea through out last night and today. pt would like something to help with the diarrhea, especially for his hemorrhoids. thanks, maryann [phone]    Problem: Adult Inpatient Plan of Care  Goal: Plan of Care Review  Outcome: Ongoing, Progressing  Goal: Optimal Comfort and Wellbeing  Outcome: Ongoing, Progressing    Electronically signed by Neta Mends, RN at 09/30/2020  9:00 PM EDT

## 2020-09-30 NOTE — Consults (Signed)
Formatting of this note is different from the original.  Internal Medicine [facility]    Patient Name: Caleb Robinson  Age: 69 y.o.  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  No post-op complications, pt moving out to floor from ICU.  Pt seems to be doing well, sitting up comfortably, denies any pain currently.  No respiratory complaint, also denies GI/GU issues.    PMH:  History reviewed. No pertinent past medical history.    PSH:  History reviewed. No pertinent surgical history.    Current Medications:    Current Facility-Administered Medications:   ?  acetaminophen (TYLENOL) 650 mg/20.3 mL oral solution 650 mg, 650 mg, Per OG/NG Tube, 4 times per day, Jason Fila, MD, 650 mg at 09/30/20 0703  ?  aspirin tablet 325 mg, 325 mg, Per OG/NG Tube, Daily, Jason Fila, MD, 325 mg at 09/30/20 0755  ?  budesonide-formoterol (Symbicort) 80-4.5 mcg/actuation inhaler 2 puff, 2 puff, Inhalation, BID, Jason Fila, MD, 2 puff at 09/30/20 0954  ?  celecoxib (CELEBREX) capsule 200 mg, 200 mg, Oral, BID, Jason Fila, MD, 200 mg at 09/30/20 0756  ?  dextrose 10% infusion, 0-250 mL/hr, Intravenous, On Call, Jason Fila, MD  ?  dextrose 10% IV bolus 75-150 mL, 75-150 mL, Intravenous, On Call, Jason Fila, MD  ?  docusate sodium (COLACE) 10 mg/mL oral liquid 100 mg, 100 mg, Per G Tube, BID, Jason Fila, MD, 100 mg at 09/29/20 0902  ?  enoxaparin (Lovenox) 40 mg/0.4 mL subcutaneous syringe 40 mg, 40 mg, Subcutaneous, Q24H, Jason Fila, MD, 40 mg at 09/30/20 0756  ?  famotidine (PEPCID) tablet 20 mg, 20 mg, Oral, BID, Jason Fila, MD, 20 mg at 09/30/20 0756  ?  gabapentin (NEURONTIN) 250 mg/5 mL oral solution 300 mg, 300 mg, Per OG/NG Tube, 3 times per day, Jason Fila, MD, 300 mg  at 09/30/20 0755  ?  glucagon 1mg /mL injection 1 mg, 1 mg, Intramuscular, On Call, Jason Fila, MD  ?  glucose (Dex4 Glucose) chewable tablet 16 g, 16 g, Oral, On Call, Jason Fila, MD  ?  hydrocortisone (ANUSOL-HC) 2.5 % external rectal cream, , Perianal, QID PRN, Arelia Sneddon, MD  ?  HYDROmorphone (PF) (Dilaudid) 0.5 mg/0.5 mL injection 0.2-0.4 mg, 0.2-0.4 mg, Intravenous, Q2H PRN, Jason Fila, MD, 0.2 mg at 09/28/20 2025  ?  insulin aspart (NovoLOG) 100units/mL subcutaneous injection 1-5 Units, 1-5 Units, Subcutaneous, 6 times per day, Jason Fila, MD, 1 Units at 09/28/20 1128  ?  ipratropium-albuterol (Duo-Neb) 0.5 mg-3 mg(2.5 mg base)/3 mL nebulizer solution 3 mL, 3 mL, Inhalation, Q6H Beason, Jason Fila, MD, 3 mL at 09/30/20 4270  ?  ipratropium-albuterol (Duo-Neb) 0.5 mg-3 mg(2.5 mg base)/3 mL nebulizer solution 3 mL, 3 mL, Inhalation, Q4H PRN, Jason Fila, MD  ?  melatonin tablet 5 mg, 5 mg, Oral, QHS, Jason Fila, MD, 5 mg at 09/29/20 2032  ?  mirtazapine (REMERON) tablet 15 mg, 15 mg, Per OG/NG Tube, QHS, Jason Fila, MD, 15 mg at 09/29/20 2032  ?  ondansetron (PF) (ZOFRAN) 2mg /mL injection 8 mg, 8 mg, Intravenous, Q8H PRN, Jason Fila, MD  ?  oxyCODONE (Roxicodone) 5 mg/5 mL oral  solution 5-10 mg, 5-10 mg, Per G Tube, Q4H PRN, Jason Fila, MD, 10 mg at 09/28/20 1344  ?  prenatal vitamin (Prenatal Plus Calcium Carbonate) 27 mg iron- 1 mg tablet 1 tablet, 1 tablet, Per OG/NG Tube, Daily, Jason Fila, MD, 1 tablet at 09/30/20 0755  ?  promethazine (PHENERGAN) 12.5 mg in NaCl 0.9% 50 mL IVPB, 12.5 mg, Intravenous, Q6H PRN, Jason Fila, MD  ?  Derrill Memo ON 10/01/2020] protein supplement (PROSOURCE NO CARB) 15 gram oral liquid packet 30 mL, 30 mL, Per OG/NG Tube, Daily, Janece Canterbury, MD  ?  ropivacaine (PF) (NAROPIN) 0.2 % in NaCl 0.9% 400 mL ON-Q (PAINBUSTER) irrigation pump, 5 mL/hr, Irrigation, Continuous, Roney Marion, MD, Last Rate: 5 mL/hr at 09/27/20 1939, 5 mL/hr at 09/27/20 1939  ?  ropivacaine (PF)  (NAROPIN) 0.2 % in NaCl 0.9% 750 mL ON-Q (PAINBUSTER SELECT-A-FLOW) irrigation, 2-14 mL/hr, Other, Continuous, Roney Marion, MD, Last Rate: 10 mL/hr at 09/27/20 1939, 10 mL/hr at 09/27/20 1939  ?  rosuvastatin (CRESTOR) tablet 10 mg, 10 mg, Oral, Daily, Jason Fila, MD, 10 mg at 09/30/20 0755  ?  sennosides (SENOKOT) 8.8 mg/5 mL syrup 8.8 mg, 8.8 mg, Per G Tube, BID, Jason Fila, MD, 8.8 mg at 09/29/20 0902  ?  thiamine (VITAMIN B1) tablet 100 mg, 100 mg, Per OG/NG Tube, Daily, Jason Fila, MD, 100 mg at 09/30/20 0755    Home Medications:  Prior to Admission medications    Medication Sig Start Date End Date Taking? Authorizing Provider   albuterol 0.083 % (Proventil) 2.5 mg /3 mL (0.083 %) nebulizer solution Inhale 2.5 mg into the lungs if needed.  11/23/19  Yes PROVIDER, HISTORICAL   amLODIPine (NORVASC) 5 mg tablet Take 5 mg by mouth daily.  05/08/20  Yes PROVIDER, HISTORICAL   ascorbic acid (vitamin C) (vitamin C) 100 mg tablet Take 100 mg by mouth daily.   Yes PROVIDER, HISTORICAL   aspirin 81 mg delayed release enteric coated tablet Take 81 mg by mouth daily.  11/23/19  Yes PROVIDER, HISTORICAL   cholecalciferol (vitamin D3) 25 mcg (1,000 unit) capsule Take 1,000 Units by mouth daily.    Yes PROVIDER, HISTORICAL   ferrous sulfate 325 mg (65 mg iron) tablet Take 65 mg of iron by mouth each morning before breakfast.    Yes PROVIDER, HISTORICAL   fluticasone-umeclidinium-vilanterol (Trelegy Ellipta) 100-62.5-25 mcg/dose powder inhaler Inhale 1 puff into the lungs once daily.  09/28/19  Yes PROVIDER, HISTORICAL   gabapentin (NEURONTIN) 100 mg capsule Take 200 mg by mouth 3 times daily.  04/08/20  Yes PROVIDER, HISTORICAL   ibuprofen (MOTRIN) 800 mg tablet Take 800 mg by mouth if needed.  06/19/20  Yes PROVIDER, HISTORICAL   melatonin 10 mg tablet Take 10 mg by mouth at bedtime as needed.    Yes PROVIDER, HISTORICAL   mirtazapine (REMERON) 15 mg tablet Take 15 mg by mouth at bedtime.  05/25/20  Yes PROVIDER, HISTORICAL    multivitamin tablet Take 1 tablet by mouth daily.    Yes PROVIDER, HISTORICAL   OTHER MEDICATION daily. appetite supplement   Yes PROVIDER, HISTORICAL   potassium chloride (MICRO-K) 10 mEq extended release capsule Take by mouth daily.   Yes PROVIDER, HISTORICAL   rosuvastatin (CRESTOR) 10 mg tablet Take 10 mg by mouth daily.  05/08/20  Yes PROVIDER, HISTORICAL   zinc gluconate 50 mg tablet Take 50 mg by mouth daily.   Yes PROVIDER, HISTORICAL   lidocaine (XYLOCAINE) 2 % mucosal solution  Apply 5 mL topically 3 times daily. Swish/spit PRN to help with oral cavity pain r/t cancer  Patient not taking: Reported on 09/28/2020 09/13/20   Donne Hazel, NP     Allergies:  Review of patient's allergies indicates:   Allergen Reactions   ? Cat dander Swelling     Social History:  Social History     Socioeconomic History   ? Marital status: Single     Spouse name: Not on file   ? Number of children: Not on file   ? Years of education: Not on file   ? Highest education level: Not on file   Occupational History   ? Not on file   Tobacco Use   ? Smoking status: Current Every Day Smoker     Packs/day: 0.25     Types: Cigarettes   ? Smokeless tobacco: Never Used   Substance and Sexual Activity   ? Alcohol use: Yes     Comment: 1 drink a month   ? Drug use: Never   ? Sexual activity: Not on file   Other Topics Concern   ? Not on file   Social History Narrative   ? Not on file     Social Determinants of Health     Financial Resource Strain:    ? Difficulty of Paying Living Expenses: Not on file   Food Insecurity:    ? Worried About Running Out of Food in the Last Year: Not on file   ? Ran Out of Food in the Last Year: Not on file   Transportation Needs:    ? Lack of Transportation (Medical): Not on file   ? Lack of Transportation (Non-Medical): Not on file   Physical Activity:    ? Days of Exercise per Week: Not on file   ? Minutes of Exercise per Session: Not on file   Stress:    ? Feeling of Stress : Not on file   Social  Connections:    ? Frequency of Communication with Friends and Family: Not on file   ? Frequency of Social Gatherings with Friends and Family: Not on file   ? Attends Religious Services: Not on file   ? Active Member of Clubs or Organizations: Not on file   ? Attends Archivist Meetings: Not on file   ? Marital Status: Not on file   Intimate Partner Violence:    ? Fear of Current or Ex-Partner: Not on file   ? Emotionally Abused: Not on file   ? Physically Abused: Not on file   ? Sexually Abused: Not on file   Housing Stability:    ? Unable to Pay for Housing in the Last Year: Not on file   ? Number of Places Lived in the Last Year: Not on file   ? Unstable Housing in the Last Year: Not on file     Family History:  Family History   Problem Relation Age of Onset   ? Anesthetic Reactions Neg Hx      Review of Systems:  Negative accept as described in HPI    Objective     Vital Signs:  BP 134/61 (BP Location: Right arm, Patient Position: Lying)   Pulse 85   Temp 36.9 C (98.4 F) (Axillary)   Resp 20   Ht 162.6 cm (5\' 4" )   Wt (!) 43.1 kg (95 lb 0.3 oz)   SpO2 98%   BMI 16.31 kg/m  Physical Exam:  Gen: WM in NAD  HEENT: PERRL, anicteric, MMM, EOMI, post-surgical flap changes  CV: No JVD appreciated, RRR, no m/g/r  Pulm: CTAB, no wheezes or crackles, no inc WoB  Abd: soft, nontender, non-distended, bowel sounds were present, no rebound or guarding  Ext: no c/c/e  Skin: no jaundice, rashes, or masses noted  Neuro: AxOx3, nonfocal, no asterixis, CNs grossly intact    Labs:  Lab Results   Component Value Date    PLT 159 09/30/2020    RBC 3.36 (L) 09/30/2020    WBC 27.35 (H) 09/30/2020    HGB 10.7 (L) 09/30/2020    HCT 31.1 (L) 09/30/2020    RDW 15.2 (H) 09/30/2020    MCH 31.8 (H) 09/30/2020    MCHC 34.4 09/30/2020    MCV 92.6 09/30/2020    MPV 11.3 09/30/2020    BUN 18 09/30/2020    NA 143.0 09/30/2020    K 4.7 09/30/2020    CL 109 (H) 09/30/2020    CO2CT 24 09/30/2020    ANIONGAP 10 09/30/2020     GLUCOSE 99.0 09/30/2020    CREATININE 0.6 (L) 09/30/2020    CALCIUM 8.2 (L) 09/30/2020    BILITOT 0.3 09/27/2020    AST 21 09/27/2020    ALT 16 09/27/2020    ALKPHOS 74 09/27/2020    PROT 5.5 (L) 09/27/2020    INR 1.24 (H) 09/27/2020     Radiology:  XR Chest/Upper Abdomen NG Tube Plcmnt   Final Result   IMPRESSION:     Nasoenteric feeding catheter tip overlies the stomach.     Dictated by: Jacki Cones, MD. 09/27/2020 10:30 PM     I, Thornton Dales, MD, have reviewed the study and agree with the findings in   this report.  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    SCC of tongue - post-surgical mgmt per primary team    COPD / tobacco abuse - no evidence of exacerbation, pt has been actively smoking up until surgery  -duonebs q4h prn  -nicotine patch ordered (pt agreeable)  -consult for smoking cessation    HTN - BP stable  -continue home norvasc 5mg     Will follow, call with questions.    Loren Racer, [place]  [phone]  Date: 09/30/2020  Time: 10:04 AM    Electronically signed by Tonita Phoenix, MD at 09/30/2020  2:20 PM EDT

## 2020-09-30 NOTE — Progress Notes (Signed)
Formatting of this note might be different from the original.  For all Team C patient questions from 6am-5pm during the week please contact the Team C intern at [phone] or their pager. On weekdays between 5pm-6am and weekends please first attempt to contact the intern on call at [phone] or, secondarily, the junior ENT resident on call.    Otolaryngology--Head and Neck Surgery Progress Note    Patient Name: Caleb Robinson  Date: 09/30/2020  Admission Date: 09/27/2020  Service: Otolaryngology--Head and Neck Surgery    Subjective:   AF, sat 70-89% x 1 hr, other VSS . On 10 L FiO2 40 via TC. Transferred to HDU in afternoon yesterday. Advancing to goal Tfs. Flap checks completed last night and stable.    Objective:  General: NAD  HEENT: supple neck with midline trachea, surgical incision sites c/d/i, JP in place to bulb suction with ss output. Intraoral flap pink with good turgor, strong A/V signals, no evidence of congestion. 6-0 DCT, some expected postoperative edema. DHT.   CV: pulses intact, regular rate  Resp: no increased work of breathing on TC  Ext: no cyanosis or swelling or rashes. Donor site under CAM boot, wound vac in place.    Neuro: EOMI    Assessment:   69 y.o. male with PMH of anterior OC SCCa, active smoker with COPD who is 3 Days Post-Op s/p 70yo M w/  now s/p Comp OC w/ segmental mandibulectmy, partial gloss, b/l ND, trach, L fibula free flap, STSG. Vessels: L Facial A, retromandibular V.     Neuro:   - Multimodal pain control    HEENT:  - No pillows, trach ties around neck  - Routine JP care: Please strip JP q2h. Empty and record output q4h and prn. Notify MD for significantly increasing output or significant change in color/consistency   -RN flap checks q2h while in HDU  - BID MD flap checks   - Bacitracin ointment to incisions BID until POD#3. POD#4 and after clean incision with a 50/50 mixture of hydrogen peroxide and saline, and apply Vaseline ointment to incisions BID    CV/Heme:  - Continue  tele  - Notify MD if Systolic blood pressure > 170.   - Notify MD for sustained pulse > 110 while pain is well controlled  - ASA 325    Pulm:   - Wean O2 to keep sats >92%    FENGI:  - Replete electrolytes to keep K >4.0, Mg>2.0  - IVF per STICU  - Diet NPO diet OTHER (COMMENTS)  Nutren 1.5   - Advance TF to small boluses   - Bowel regimen senna/colace    GU:  - Notify MD if UOP <30cc/h    ID:   - s/p unasyn course     ENDO:   - SSI as appropriate    PPx:   - DVT: LVX 30 BID  - GI: H2     MSK:   -Will follow PT/OT   - nonweight bearing left leg. Cast to remain in place     Dispo  - HDU  - ent comanagement today     Janece Canterbury, MD  [facility] Department of Otolaryngology-Head and Neck Surgery  Pager #: [phone]  09/30/20  6:59 AM      Electronically signed by Janece Canterbury, MD at 09/30/2020  6:59 AM EDT

## 2020-10-01 NOTE — Progress Notes (Signed)
Formatting of this note might be different from the original.    Problem: Adult Inpatient Plan of Care  Goal: Optimal Comfort and Wellbeing  Outcome: Ongoing, Progressing    Problem: Infection  Goal: Infection Symptom Resolution  Outcome: Ongoing, Progressing    Problem: Communication Impairment (Artificial Airway)  Goal: Effective Communication  Outcome: Ongoing, Progressing    Problem: Device-Related Complication Risk (Artificial Airway)  Goal: Optimal Device Function  Outcome: Ongoing, Progressing    Problem: Skin and Tissue Injury (Artificial Airway)  Goal: Absence of Device-Related Skin or Tissue Injury  Outcome: Ongoing, Progressing    Electronically signed by Regenia Skeeter, RN at 10/01/2020  9:04 PM EDT

## 2020-10-01 NOTE — Progress Notes (Signed)
Formatting of this note is different from the original.  Internal Medicine [place] [facility] [place]    Patient Name: Caleb Robinson  Age: 69 y.o.  Sex: male  MRN: [id number]  Date: 10/01/2020  Admission Date: 09/27/2020  Service: ENT    Subjective     No events, C Diff returned positive.  Pt reports he has no pain.    Objective     Vital Signs:  BP 119/57 (BP Location: Right arm, Patient Position: Lying)   Pulse 83   Temp 36.7 C (98.1 F) (Axillary)   Resp 20   Ht 162.6 cm (5\' 4" )   Wt (!) 43.1 kg (95 lb 0.3 oz)   SpO2 98%   BMI 16.31 kg/m     Physical Exam:  Gen: WM in NAD  HEENT: PERRL, anicteric, MMM, EOMI, post-surgical flap changes  CV: No JVD appreciated, RRR, no m/g/r  Pulm: CTAB, no wheezes or crackles, no inc WoB  Abd: soft, nontender, non-distended, bowel sounds were present, no rebound or guarding  Ext: no c/c/e  Skin: no jaundice, rashes, or masses noted  Neuro: AxOx3, nonfocal, no asterixis, CNs grossly intact    Labs:  Lab Results   Component Value Date    PLT 186 10/01/2020    RBC 3.54 (L) 10/01/2020    WBC 26.21 (H) 10/01/2020    HGB 11.2 (L) 10/01/2020    HCT 32.5 (L) 10/01/2020    RDW 15.2 (H) 10/01/2020    MCH 31.6 (H) 10/01/2020    MCHC 34.5 (H) 10/01/2020    MCV 91.8 10/01/2020    MPV 11.4 10/01/2020    BUN 22 10/01/2020    NA 144.0 10/01/2020    K 3.7 10/01/2020    CL 112 (H) 10/01/2020    CO2CT 23 10/01/2020    ANIONGAP 9 10/01/2020    GLUCOSE 82.0 10/01/2020    CREATININE 0.8 10/01/2020    CALCIUM 8.3 (L) 10/01/2020    BILITOT 0.3 09/27/2020    AST 21 09/27/2020    ALT 16 09/27/2020    ALKPHOS 74 09/27/2020    PROT 5.5 (L) 09/27/2020    INR 1.24 (H) 09/27/2020     Radiology:  XR Chest/Upper Abdomen NG Tube Plcmnt   Final Result   IMPRESSION:     Nasoenteric feeding catheter tip overlies the stomach.     Dictated by: Jacki Cones, MD. 09/27/2020 10:30 PM     I, Thornton Dales, MD, have reviewed the study and agree with the findings in   this report.   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    C Diff - agree with vanc via NG, would treat 10-14 days    SCC of tongue - post-surgical mgmt per primary team    COPD / tobacco abuse - no evidence of exacerbation, pt has been actively smoking up until surgery  -duonebs q4h prn  -nicotine patch ordered (pt agreeable)  -consult for smoking cessation    HTN - BP stable  -continue home norvasc 5mg     Will follow, call with questions.    Loren Racer, [place]  [phone]  Date: 10/01/2020  Time: 12:35 PM    Electronically signed by Tonita Phoenix, MD at 10/01/2020 12:36 PM EDT

## 2020-10-01 NOTE — Progress Notes (Signed)
Formatting of this note might be different from the original.    Problem: Adult Inpatient Plan of Care  Goal: Plan of Care Review  Outcome: Ongoing, Progressing  Goal: Patient-Specific Goal (Individualization)  Outcome: Ongoing, Progressing  Goal: Absence of Hospital-Acquired Illness or Injury  Outcome: Ongoing, Progressing  Goal: Optimal Comfort and Wellbeing  Outcome: Ongoing, Progressing  Goal: Readiness for Transition of Care  Outcome: Ongoing, Progressing  Goal: Rounds/Family Conference  Outcome: Ongoing, Progressing    Problem: Infection  Goal: Infection Symptom Resolution  Outcome: Ongoing, Progressing    Electronically signed by Darden Amber, RN at 10/01/2020  1:44 PM EDT

## 2020-10-01 NOTE — Progress Notes (Signed)
Formatting of this note might be different from the original.  For all Team C patient questions from 6am-5pm during the week please contact the Team C intern at [phone] or their pager. On weekdays between 5pm-6am and weekends please first attempt to contact the intern on call at [phone] or, secondarily, the junior ENT resident on call.    Otolaryngology--Head and Neck Surgery Progress Note    Patient Name: Caleb Robinson  Date: 10/01/2020  Admission Date: 09/27/2020  Service: Otolaryngology--Head and Neck Surgery    Subjective:   AFVSS. On 5 L FiO2 28% via TC (vs.10 L FiO2 40% yesterday). Desats resolved. WBC uptrended from 24.9 to 27.4. Overnight, multiple large volume loose BM in the context of leukocytosis. C difficile labs ordered, pending.     Objective:  General: NAD  HEENT: supple neck with midline trachea, surgical incision sites c/d/i, JP in place to bulb suction with ss output. Intraoral flap pink with good turgor, strong Arterial signals, Venous signal more quiet, still with good cap refill and no evidence of congestion. 6-0 DCT, some expected postoperative edema. DHT.   CV: pulses intact, regular rate  Resp: no increased work of breathing on TC  Ext: no cyanosis or swelling or rashes. Donor site under CAM boot, wound vac in place.    Neuro: EOMI    Assessment:   69 y.o. male with PMH of anterior OC SCCa, active smoker with COPD who is 4 Days Post-Op s/p 69yo M w/  now s/p Comp OC w/ segmental mandibulectmy, partial gloss, b/l ND, trach, L fibula free flap, STSG. Vessels: L Facial A, retromandibular V.     Neuro:   - Multimodal pain control    HEENT:  - No pillows, trach ties around neck  - Routine JP care: Please strip JP q2h. Empty and record output q4h and prn. Notify MD for significantly increasing output or significant change in color/consistency   -RN flap checks q2h while in HDU  - BID MD flap checks   - Bacitracin ointment to incisions BID until POD#3. POD#4 and after clean incision with a 50/50  mixture of hydrogen peroxide and saline, and apply Vaseline ointment to incisions BID    CV/Heme:  - Continue tele  - Notify MD if Systolic blood pressure > 170.   - Notify MD for sustained pulse > 110 while pain is well controlled  - ASA 325    Pulm:   - Wean O2 to keep sats >92%    FENGI:  - Replete electrolytes to keep K >4.0, Mg>2.0  - IVF per STICU  - Diet NPO diet OTHER (COMMENTS)  Nutren 1.5   - Advance TF as tolerated   - Bowel regimen senna/colace (held), add banana flakes     GU:  - Notify MD if UOP <30cc/h    ID:   - s/p unasyn course   - Follow up c. difficile labs     ENDO:   - SSI as appropriate    PPx:   - DVT: LVX 30 BID  - GI: H2     MSK:   - Will follow PT/OT   - nonweight bearing left leg. Cast to remain in place     Dispo  - HDU  - ent comanagement today     Bartholomew Boards MD [facility]  PGY-2, Otolaryngology Head and [place] [facility] [place]   Pager: [phone]      Electronically signed by Arelia Sneddon, MD at  10/01/2020  6:23 AM EDT

## 2020-10-02 NOTE — Progress Notes (Signed)
Formatting of this note is different from the original.     10/02/20 1725   Interdisciplinary Collaboration   Participants case manager; patient   Discharge Needs Screening Screened, unable to determine needs at this time, continue to assess   Discharge Needs Assessment   Admission Type inpatient   Initial IMM Administered Yes   Concerns to be Addressed discharge planning   Living Arrangements apartment   Living Arrangements comment   (Lives with his ex wife. There are no STE. )   Able to Return to Prior Arrangements yes   Able to perform self-care? yes   Patient's goal for discharge Home with services   Primary Caregiver self   Who manages patient's meds self   Health Care Decisions patient/family ability to make health care decisions   Transportation Available family or friend will provide  (Son to provide. )   Transportation Anticipated family or friend will provide   Income / Special educational needs teacher [place] Coverage Name [place] Medicare Complete   Functional Status Current   Ambulation other (see comments)  (UTA)   Primary Care Physician Information   Name Dr. Ernestine Mcmurray   Living Environment   Lives With other relative(s)   Provides Primary Care For no one   Primary Care Provided By self   Discharge Needs Assessment   Current Discharge Risk chronically ill   NCM spoke with patient at bedside. Patient lives with his ex wife in an apartment. There are no stairs to enter. Emergency contact is Darshan Solanki, [phone]. Patient is not current with [facility] services. Patient does not have a preference for [facility] agencies. Verbal consent received from patient for choice letter. NCM sent referral to [facility].  Patient does not use any DME. Patients son [employer] [phone]) to provide transportation. Primary NCM to continue to follow.   Electronically signed by Margaretmary Dys, RN at 10/02/2020  5:32 PM EDT

## 2020-10-02 NOTE — Progress Notes (Signed)
Formatting of this note might be different from the original.    Problem: Adult Inpatient Plan of Care  Goal: Plan of Care Review  Outcome: Ongoing, Progressing  Goal: Optimal Comfort and Wellbeing  Outcome: Ongoing, Progressing    Electronically signed by Rollene Fare, RN at 10/02/2020 10:30 AM EDT

## 2020-10-02 NOTE — Progress Notes (Signed)
Formatting of this note is different from the original.     10/02/20 1428   Interdisciplinary Collaboration   Participants case manager; charge nurse; social work/services; speech language pathology; physician   Patient is being treated for positive C-Diff. Trach to be changed today. PT/OT recommending home with home health. Continue to monitor.   Electronically signed by Margaretmary Dys, RN at 10/02/2020  2:29 PM EDT

## 2020-10-02 NOTE — Progress Notes (Signed)
Formatting of this note might be different from the original.  For all Team C patient questions from 6am-5pm during the week please contact the Team C intern at [phone] or their pager. On weekdays between 5pm-6am and weekends please first attempt to contact the intern on call at [phone] or, secondarily, the junior ENT resident on call.    Otolaryngology--Head and Neck Surgery Progress Note    Patient Name: Caleb Robinson  Date: 10/02/2020  Admission Date: 09/27/2020  Service: Otolaryngology--Head and Neck Surgery    Subjective:   AFVSS. On 5 L FiO2 28% via TC. Tested C. Diff positive in setting of uptrending WBC 27, started on PO vanc for 10 day course. BM x 4. Jp4 removed.    Prealb: 8.4   Alb: 2.9   TSH: 3.33   T4: 0.93     Objective:  General: NAD  HEENT: supple neck with midline trachea, surgical incision sites c/d/i, JP in place to bulb suction with ss output. Intraoral flap pink with good turgor, strong Arterial signals, Venous signal more quiet, still with good cap refill and no evidence of congestion. 6-0 DCT, some expected postoperative edema. DHT.   CV: pulses intact, regular rate  Resp: no increased work of breathing on TC  Ext: no cyanosis or swelling or rashes. Donor site under CAM boot, wound vac in place.    Neuro: EOMI    Assessment:   69 y.o. male with PMH of anterior OC SCCa, active smoker with COPD who is 5 Days Post-Op s/p 69yo M w/  now s/p Comp OC w/ segmental mandibulectmy, partial gloss, b/l ND, trach, L fibula free flap, STSG. Vessels: L Facial A, retromandibular V.     Neuro:   - Multimodal pain control    HEENT:  - No pillows, trach ties around neck  - Routine JP care: Please strip JP q2h. Empty and record output q4h and prn. Notify MD for significantly increasing output or significant change in color/consistency   -RN flap checks q2h while in HDU  - BID MD flap checks   - Bacitracin ointment to incisions BID until POD#3. POD#4 and after clean incision with a 50/50 mixture of hydrogen  peroxide and saline, and apply Vaseline ointment to incisions BID  - Trach change, PSMV after with SLP  - Add more On-Q pain medicine     CV/Heme:  - Continue tele  - Notify MD if Systolic blood pressure > 170.   - Notify MD for sustained pulse > 110 while pain is well controlled  - ASA 325    Pulm:   - Wean O2 to keep sats >92%    FENGI:  - Replete electrolytes to keep K >4.0, Mg>2.0  - Diet NPO diet OTHER (COMMENTS)  Nutren 1.5   - Advance TF as tolerated   - Bowel regimen senna/colace (held), banana flakes     GU:  - Notify MD if UOP <30cc/h    ID:   - s/p unasyn course   - PO Vanc course for C. Diff  - Start probiotics    ENDO:   - SSI as appropriate    PPx:   - DVT: LVX 30 BID  - GI: H2     MSK:   - Will follow PT/OT   - Cast to remain in place     Dispo  - HDU    [name]  Resident Physician - PGY3  Otolaryngology - Head & Neck Surgery  Pager [phone]  Electronically signed by Nunzio Cobbs, MD at 10/02/2020  6:23 AM EDT

## 2020-10-02 NOTE — Progress Notes (Signed)
Formatting of this note is different from the original.  Physical Therapy Inpatient Progress note  [facility]    Patient Name: Caleb Robinson    Age: 69 y.o.    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]  Treatment Diagnosis: Generalized deconditioning and acute post-op pain    Total Treatment Time:  15  Timed Codes:  15    Subjective     Subjective: Pt was very agreeable to therapy and motivated towards improving function   Patient's Goals: to return home     Pain:  Pre-treatment: other (see comments)  Post-treatment: other (see comments)  Comment: pain at fib flap site    Precautions: Drain, Fall risk, Free flap, Incision / suture site, IV, NGT, Oxygen, and Tracheostomy  Left LE:  WBAT    Barriers to Learning: No known limitations    Objective     Cognitive Status  Orientation: Oriented x4  Level of Consciousness: Cooperative  Follows Commands: 100% of the time  Safety and Judgment: Intact    Treatment Performed:    Bed Mobility   -Rolling: I for bed mobility   -Supine to sit: Mod I with adjustments made to bed   -Sit to supine: Mod I with adjustments made to bed and verbal cues to slowly lower trunk to bed     Transfers  -Sit to stand: x1 with FWW and SBA, verbal cues to use UE to push up from bed     Balance: normal seated balance, slight unsteadiness with dynamic standing and walker going to far in front of pt  Gait: pt ambulated 75" with FWW and verbal cues to maintain walker within close proximity of trunk     Education: Pt was educated on pt POC to progress  functional mobility     Pt left in bed c all needs met, call bell in reach, RN aware, bed alarm intact and audible.    [facility] Inpatient Basic Mobility Short Form  How much difficulty does the patient currently have...  1. Turning over in bed (including adjusting bedclothes, sheets and blankets)? A Little  2. Sitting down on and standing up from a chair with arms (e.g., wheelchair, bedside commode, etc.) A Little  3. Moving from lying on back to sitting on the side of the bed? A Little    How much help from another person does the patient currently need...  4. Moving to and from a bed to a chair (including a wheelchair)? A Lot  5. Need to walk in hospital room? A Lot  6. Climbing 3-5 steps with a railing? Unable    Raw Score: 14  T-score: 38.1      Assessment     Assessment: Pt was able to ambulate 53' and demonstrated bed mobility all with little to no assistance given. This is pt's first time ambulating since ICU and demonstrated good functional strength and limitations with keeping the walker in close proximity of the trunk while ambulating, Continue to improve  functional endurance and independence.     Recommendations:  Discharge Disposition: home with home health, home with assist  Equipment Needed: other (see comments)    Goals     Short Term Goals:  Pt will be I with all levels of bed mobility by 10/31  Pt will go from supine in bed to OOB with STS I by 10/22  Pt will ambulate 100' with SPV by 10/31    Long Term Goal  Pt will ambulate 500' independently by 11/10    Plan     Frequency: 4-6 times per week     Interventions: Gait training therapy - 97116, Therapeutic activities - 97530, Therapeutic exercises - [place]    Therapist: Altha Harm    I was present in the room and guided service delivery when the student was participating in the provision of services, and was not engaged in treating any other patients or any other task at the time of service delivery. I am responsible for the plan of care and  assessment for this patient.     Therapist: Lorelee Market, PT, DPT  Pager ID: [phone]      Electronically signed by Lorelee Market, PT, DPT at 10/02/2020  1:14 PM EDT

## 2020-10-02 NOTE — Progress Notes (Signed)
Formatting of this note is different from the original.  HOSPITALIST CONSULT PROGRESS NOTE    Patient: Caleb Robinson  Date of service: 10/02/2020  Date of admission:  09/27/2020  Service/attending: ENT-C / Nolberto Hanlon    Active Problems:  Patient Active Problem List   Diagnosis   ? Cancer of oral cavity   ? Hypovolemic shock   ? Chronic obstructive pulmonary disease   ? Acute blood loss anemia   ? Hypotension after procedure   ? On enteral nutrition   ? Hypokalemia   ? Hypomagnesemia   ? Tobacco abuse     Subjective     Pt resting in bed and watching TV. He writes that he is feeling well but continued to have diarrhea. Denies cp, dizziness, palpitations, sob, abdominal pain or n/v.    Objective     Vitals:  Blood pressure 151/59, pulse 89, temperature 36.3 C (97.3 F), temperature source Axillary, resp. rate 20, height 162.6 cm (5\' 4" ), weight (!) 43.1 kg (95 lb 0.3 oz), SpO2 100 %.    Physical Exam:  GENERAL:  This is a well-developed male, in no acute distress.  EYES:  Extraocular movements are intact.  Sclerae nonicteric.  ENT: Postsurgical changes. DHT sutured to nare   NECK: Trach sutured in place. Incision intact j/p and doppler wires in place   CARDIOVASCULAR: RRR. No murmurs, rubs, or gallops.  RESPIRATORY: Normal respiratory effort. Lungs are clear to auscultation bilaterally  GASTROINTESTINAL: Abdomen is soft, nontender, and nondistended with normal bowel sounds.  MUSCULOSKELETAL: LLE in CAM boot, wound vac and onQ in place  NEURO/PSYCH:  Pleasant and cooperative. Alert and oriented.  Motor and sensory function grossly intact and symmetrical in all four extremities.    PERTINENT LABS:  Lab Results   Component Value Date    WBC 17.83 (H) 10/02/2020    HGB 12.6 (L) 10/02/2020    HCT 37.0 (L) 10/02/2020    MCV 92.0 10/02/2020    PLT 223 10/02/2020    NEUTROABS 5.89 09/12/2020     Lab Results   Component Value Date    NA 144.0 10/02/2020    K 3.8 10/02/2020    CL 113 (H) 10/02/2020    CO2CT 21 (L)  10/02/2020    BUN 25 10/02/2020    CREATININE 0.7 10/02/2020    GLUCOSE 101.0 (H) 10/02/2020    CALCIUM 8.8 10/02/2020    MG 2.0 10/02/2020    AST 21 09/27/2020    ALT 16 09/27/2020    ALKPHOS 74 09/27/2020    PROT 5.5 (L) 09/27/2020     Lab Results   Component Value Date    INR 1.24 (H) 09/27/2020     Lab Results   Component Value Date    TSH 3.33 10/02/2020    HGBA1C 4.7 09/29/2020    VITAMINB12 895 09/12/2020    FOLATE 14.2 09/12/2020     No results found for: GLUCOSEU, PROTEINUA, BILIRUBINUR, UROBILINOGEN, LABPH, BLOODU, KETONESU, NITRITE, LEUKOCYTESUR, LABSPEC, COLORU  Lab Results   Component Value Date    INR 1.24 (H) 09/27/2020     No results found for: GLUF  No results found for: PHENYTOIN, PHENOBARB, CBMZ, LITHIUM  No results found for: CHOL, TRIG, HDL, LDLCALC  No results found for: SEDIMENTATIO, CRPLOWSENSIT  Recent Labs     10/01/20  0618 10/01/20  0816 10/01/20  1220 10/01/20  1714 10/01/20  2355 10/02/20  0548   GLUCOSEPCXWH 66.0* 114.0* 88.0 113.0* 104.0* 81.0  No results for input(s): FINALREPORT, PRELIMINARYR in the last 72 hours.    RADIOLOGY:  XR Chest/Upper Abdomen NG Tube Plcmnt   Final Result   IMPRESSION:     Nasoenteric feeding catheter tip overlies the stomach.     Dictated by: Jacki Cones, MD. 09/27/2020 10:30 PM     I, Thornton Dales, MD, have reviewed the study and agree with the findings in   this report.  09/28/2020 6:38 AM     XR Chest AP Portable    (Results Pending)     MEDICATIONS:   Scheduled Meds:  ? acetaminophen  650 mg Per OG/NG Tube 4 times per day   ? amLODIPine  5 mg Per OG/NG Tube Daily   ? aspirin  325 mg Per OG/NG Tube Daily   ? banana flakes-t-galactooligos.  10.75 g Per OG/NG Tube TID   ? budesonide-formoterol  2 puff Inhalation BID   ? celecoxib  200 mg Oral BID   ? enoxaparin  40 mg Subcutaneous Q24H   ? famotidine  20 mg Oral BID   ? gabapentin  300 mg Per OG/NG Tube 3 times per day   ? insulin aspart  1-5 Units Subcutaneous 4 times per day   ?  ipratropium-albuterol  3 mL Inhalation Q6H [place]   ? melatonin  5 mg Oral QHS   ? mirtazapine  15 mg Per OG/NG Tube QHS   ? nicotine  1 patch Transdermal Daily   ? prenatal vitamin  1 tablet Per OG/NG Tube Daily   ? protein supplement  30 mL Per OG/NG Tube Daily   ? rosuvastatin  10 mg Oral Daily   ? thiamine  100 mg Per OG/NG Tube Daily   ? vancomycin  125 mg Per OG/NG Tube 4 times per day     Continuous Infusions:  ? ropivacaine (PF) (NAROPIN) 0.2 % in NaCl 0.9% 400 mL ON-Q (PAINBUSTER) irrigation pump 5 mL/hr (09/27/20 1939)   ? ropivacaine (PF) (NAROPIN) 0.2 % in NaCl 0.9% 750 mL ON-Q (PAINBUSTER SELECT-A-FLOW) irrigation 10 mL/hr (09/27/20 1939)     PRN Meds:.dextrose, dextrose, docusate sodium, glucagon, glucose, hydrocortisone, HYDROmorphone, ipratropium-albuterol, ondansetron, oxycodone, promethazine, sennosides    ASSESSMENT & PLAN:  [name] is a 69 y.o. 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     Perioperative Management: Recommend early ambulation, PT/OT, RT for airway clearance. Wound mgt, pain control, rehab, and DVT PPX per primary team.     C Diff Colitis:  - continue vancomycin per peg, will need 10-14days tx     HTN:   - continue amlodipine 5mg  daily    COPD/Tobacco Use: No respiratory complaints. Pt smoked cigarettes up to surgery date.  - continue dyonebs as needed  - continue nicotine patch   - consider visit with smoking cessation counselor, pt is encouraged to quit    HLD:  - continue rosuvastatin daily     Mood Disorder/Insomnia:  - continue mirtazapine qHS    Ppx: Per primary team    Please call with questions or concerns.    Romualdo Bolk, NP  Pager: [phone]      Electronically signed by Patricia Pesa, FNP at 10/02/2020 11:01 AM EDT

## 2020-10-03 NOTE — Progress Notes (Signed)
Formatting of this note might be different from the original.    Problem: Adult Inpatient Plan of Care  Goal: Plan of Care Review  Outcome: Ongoing, Progressing    Electronically signed by Trula Slade, RN at 10/03/2020  1:35 AM EDT

## 2020-10-03 NOTE — Progress Notes (Signed)
Formatting of this note is different from the original.  HOSPITALIST CONSULT PROGRESS NOTE    Patient: Caleb Robinson  Date of service: 10/03/2020  Date of admission:  09/27/2020  Service/attending: ENT-C / Nolberto Hanlon    Active Problems:  Patient Active Problem List   Diagnosis   ? Cancer of oral cavity   ? Hypovolemic shock   ? Chronic obstructive pulmonary disease   ? Acute blood loss anemia   ? Hypotension after procedure   ? On enteral nutrition   ? Hypokalemia   ? Hypomagnesemia   ? Tobacco abuse   ? C. difficile colitis   ? Primary hypertension     Subjective     Pt resting in bed and watching TV. Endorses feeling well and diarrhea has improved. Looking forward to walking outside with PT today. Denies cp, dizziness, palpitations, sob, abdominal pain or n/v.    Objective     Vitals:  Blood pressure 147/58, pulse 84, temperature 36.4 C (97.6 F), temperature source Axillary, resp. rate 18, height 162.6 cm (5\' 4" ), weight (!) 43.1 kg (95 lb 0.3 oz), SpO2 96 %.    Physical Exam:  GENERAL:  This is a well-developed male, in no acute distress.  EYES:  Extraocular movements are intact.  Sclerae nonicteric.  ENT: Postsurgical changes. DHT sutured to nare   NECK: Trach sutured in place. Incision intact, j/p and doppler wires in place   CARDIOVASCULAR: RRR. No murmurs, rubs, or gallops.  RESPIRATORY: Normal respiratory effort. Lungs are clear bilaterally  GASTROINTESTINAL: Abdomen is soft, nontender, and nondistended with normal bowel sounds.  MUSCULOSKELETAL: wound vac and onQ in place to LLE  NEURO/PSYCH:  Pleasant and cooperative. Alert and oriented.  Motor and sensory function grossly intact and symmetrical in all four extremities.    PERTINENT LABS:  Lab Results   Component Value Date    WBC 18.02 (H) 10/03/2020    HGB 11.2 (L) 10/03/2020    HCT 33.5 (L) 10/03/2020    MCV 93.8 10/03/2020    PLT 240 10/03/2020    NEUTROABS 5.89 09/12/2020     Lab Results   Component Value Date    NA 144.0 10/03/2020    K 4.1  10/03/2020    CL 112 (H) 10/03/2020    CO2CT 21 (L) 10/03/2020    BUN 22 10/03/2020    CREATININE 0.8 10/03/2020    GLUCOSE 186.0 (H) 10/03/2020    CALCIUM 8.2 (L) 10/03/2020    MG 1.9 10/03/2020    AST 21 09/27/2020    ALT 16 09/27/2020    ALKPHOS 74 09/27/2020    PROT 5.5 (L) 09/27/2020     Lab Results   Component Value Date    INR 1.24 (H) 09/27/2020     Lab Results   Component Value Date    TSH 3.33 10/02/2020    HGBA1C 4.7 09/29/2020    VITAMINB12 895 09/12/2020    FOLATE 14.2 09/12/2020     No results found for: GLUCOSEU, PROTEINUA, BILIRUBINUR, UROBILINOGEN, LABPH, BLOODU, KETONESU, NITRITE, LEUKOCYTESUR, LABSPEC, COLORU  Lab Results   Component Value Date    INR 1.24 (H) 09/27/2020     No results found for: GLUF  No results found for: PHENYTOIN, PHENOBARB, CBMZ, LITHIUM  No results found for: CHOL, TRIG, HDL, LDLCALC  No results found for: [place]  Recent Labs     10/01/20  1220 10/01/20  1714 10/01/20  2355 10/02/20  0548 10/02/20  1148 10/03/20  0550  GLUCOSEPCXWH 88.0 113.0* 104.0* 81.0 82.0 94.0     No results for input(s): FINALREPORT, PRELIMINARYR in the last 72 hours.    RADIOLOGY:  XR Chest/Upper Abdomen NG Tube Plcmnt   Final Result   IMPRESSION:     Nasoenteric feeding catheter tip overlies the stomach.     Dictated by: Jacki Cones, MD. 09/27/2020 10:30 PM     I, Thornton Dales, MD, have reviewed the study and agree with the findings in   this report.  09/28/2020 6:38 AM     XR Chest AP Portable    (Results Pending)     MEDICATIONS:   Scheduled Meds:  ? acetaminophen  650 mg Per OG/NG Tube 4 times per day   ? amLODIPine  5 mg Per OG/NG Tube Daily   ? aspirin  325 mg Per OG/NG Tube Daily   ? banana flakes-t-galactooligos.  10.75 g Per OG/NG Tube TID   ? budesonide-formoterol  2 puff Inhalation BID   ? celecoxib  200 mg Oral BID   ? enoxaparin  40 mg Subcutaneous Q24H   ? famotidine  20 mg Oral BID   ? gabapentin  300 mg Per OG/NG Tube 3 times per day   ? ipratropium-albuterol  3  mL Inhalation Q6H [place]   ? melatonin  5 mg Oral QHS   ? mirtazapine  15 mg Per OG/NG Tube QHS   ? nicotine  1 patch Transdermal Daily   ? prenatal vitamin  1 tablet Per OG/NG Tube Daily   ? protein supplement  30 mL Per OG/NG Tube Daily   ? rosuvastatin  10 mg Oral Daily   ? vancomycin  125 mg Per OG/NG Tube 4 times per day     Continuous Infusions:  ? ropivacaine (PF) (NAROPIN) 0.2 % in NaCl 0.9% 400 mL ON-Q (PAINBUSTER) irrigation pump 5 mL/hr (09/27/20 1939)   ? ropivacaine (PF) (NAROPIN) 0.2 % in NaCl 0.9% 750 mL ON-Q (PAINBUSTER SELECT-A-FLOW) irrigation 10 mL/hr (09/27/20 1939)     PRN Meds:.docusate sodium, hydrocortisone, HYDROmorphone, ipratropium-albuterol, ondansetron, oxycodone, promethazine, sennosides    ASSESSMENT & PLAN:  [name] is a 69 y.o. 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     Perioperative Management: Recommend early ambulation, PT/OT, RT for airway clearance. Wound mgt, pain control, rehab, and DVT PPX per primary team.     C Diff Colitis:  - continue vancomycin per peg (end 11/3)    HTN:   - continue amlodipine 5mg  daily    COPD/Tobacco Use: No respiratory complaints, cough improved. Pt smoked cigarettes up to surgery date.  - continue symbicort BID  - continue duonebs as needed  - continue nicotine patch  - consider visit with smoking cessation counselor, pt is encouraged to quit    HLD:  - continue rosuvastatin daily     Mood Disorder/Insomnia:  - continue mirtazapine qHS    Ppx: Per primary team    Please call with questions or concerns.    Romualdo Bolk, NP  Pager: [phone]      Electronically signed by Patricia Pesa, FNP at 10/03/2020 10:30 AM EDT

## 2020-10-03 NOTE — Progress Notes (Signed)
Formatting of this note is different from the original.     10/03/20 1000   Interdisciplinary Collaboration   Participants case manager; charge nurse; dietitian/nutrition services; physician; social work/services; speech language pathology   Discharge Needs Screening Screened, anticipate needs (see full assessment)   [place] / [place] list provided and shared quality info from Medicare.gov Patient received list   Airport Name [place] for swallow study 10/27, potentially decannulation 10/28. Pending if that occurs, pt may or may not need DME orders for suction & trach supplies. Needs HH.    Addendum [phone] [place] referral faxed to [facility] per pt choice.    Rosezetta Schlatter RN, MS  RN Case Manager  Phone: [phone]  Pager: [phone]    Electronically signed by Rosezetta Schlatter at 10/03/2020 10:58 AM EDT

## 2020-10-03 NOTE — Progress Notes (Signed)
Formatting of this note is different from the original.  Speech Pathology [place]  Charting Report    Date of Service: October 31, 2020  Patient: Caleb Robinson  MRN: [id number]  Contact Serial Number: [id number]   DOB: 1951-04-28  Age: 69 y.o.    Attending Provider: Marinell Blight, MD  Referring Provider: Marinell Blight, MD  Insurance: Payor: [facility] MEDICARE COMPLETE /  /  /   Primary Diagnosis:   Patient Problem List [ICD-10]    Diagnosis Date Noted   ? C. difficile colitis [A04.72]    ? Primary hypertension [I10]    ? Chronic obstructive pulmonary disease [J44.9]    ? Acute blood loss anemia [D62]    ? Hypotension after procedure [I95.81]    ? On enteral nutrition [Z78.9]    ? Hypokalemia [E87.6]    ? Hypomagnesemia [E83.42]    ? Tobacco abuse [Z72.0]    ? Hypovolemic shock [R57.1]    ? 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 male with PMH of anterior OC SCCa, active smoker with COPD s/p 69yo M w/  now s/p Comp OC w/ segmental mandibulectmy, partial gloss, b/l ND, trach, L fibula free flap, STSG for cT4aN1-N2cMx FOM/ventral tongue 09/27/20. Seen for PMSV eval after trach changed to 6 CFS last night.      Past Medical History: History reviewed. No pertinent past medical history.  Past Surgical History:   Past Surgical History:   Procedure Laterality Date   ? PR BONE-SKIN GRAFT, MICROVASCULAR N/A 09/27/2020    Procedure: FREE FLAP, OSTEOCUTANEOUS/ NOT ILIAC, METATARSAL OR GREAT TOE;  Surgeon: Roney Marion, MD;  Location: [facility];  Service: Otolaryngology   ?  PR EXCIS TONGUE,MOUTH,JAW,RAD NECK Bilateral 09/27/2020    Procedure: COMPOSITE GLOSSECTOMY WITH RADICAL NECK DISSECTION;  Surgeon: Jerrye Beavers Day, MD;  Location: [facility];  Service: Otolaryngology   ? PR EXPLORATION, CAROTID ARTERY  09/27/2020    Procedure: EXPLORATION W/WO LYSIS OF ARTERY; CAROTID ARTERY;  Surgeon: Roney Marion, MD;  Location: [facility];  Service: Otolaryngology   ? PR HYOID MYOTOMY & SUSPENSION Bilateral 09/27/2020    Procedure: HYOID MYOTOMY AND SUSPENSION;  Surgeon: Roney Marion, MD;  Location: [facility];  Service: Otolaryngology   ? PR RAD RESECT TUMOR SOFT TISS NECK/ANT THORAX 5+CM Right 09/27/2020    Procedure: RESECTION, RADICAL, NEOPLASM CHEST WALL 5 CM OR GREATER;  Surgeon: Jerrye Beavers Day, MD;  Location: [facility];  Service: Otolaryngology   ? PR RECONSTRUC MOUTH COMPLEX Bilateral 09/27/2020    Procedure: VESTIBULOPLASTY; COMPLEX (INCLUDING RIDGE EXTENSION, MUSCLE REPOSITIONING);  Surgeon: Roney Marion, MD;  Location: [facility];  Service: Otolaryngology   ? PR SPLIT GRFT TRUNK,ARM,LEG <100 SQCM Left 09/27/2020    Procedure: SPLIT-THICKNESS AUTOGRAFT, TRUNK, ARMS, LEGS; FIRST 100 SQ CM OR LESS, OR 1% BODY AREA INFANTS/CHILDREN;  Surgeon: Roney Marion, MD;  Location: [facility];  Service: Otolaryngology   ? PR TRACHEOSTOMY, PLANNED N/A 09/27/2020    Procedure: PERCUTANEOUS TRACHEOSTOMY;  Surgeon: Junie Panning, MD;  Location: [facility];  Service: Otolaryngology     Allergies: Cat dander  Precautions: drain, fall risk, incision/suture site, IV, NPO, NG  Ambulatory Status: Please refer to PT eval  Nutritional Screen: Patient is NPO  Pain: 2/10 (0=no pain to 10=E.R.)  Barriers to Learning: No Barriers    Passy Muir Speaking Valve Evaluation:    Tracheostomy tube  Patient underwent a tracheotomy on 09/27/20  Brand: Shiley  Cuff: cuffless  Size: 6-0   Length: standard    Mental Status  Level of Alertness: awake and alert  Simple Commands: able to  follow  Current Communication: gesturing, thumbs up/down, writing    Baseline Vital Signs  Heart Rate: 87 bpm  Oxygen Saturation: 94 %  RR: 18     Respiratory Status  Oxygen via trach collar: on 5L FiO2 28%  Requiring ventilation: no     Secretions  Color: tan, brown  Amount: minimal under trach  Consistency: thick    PO Status  Current Diet: NPO, NGT    PMSV   Tracheal Suction: not performed, not indicated  Voicing with Finger Occlusion and/or Cuff Deflation: strong  Upper Airway Patency Judgment: patent    Placed on hub: yes  Estimated Time of Tolerance: >45 mins  Verbalizations: with PMSV  Length of Utterance: conversational level  Intelligibility: 80 %   Describe Voice: clear, hyponasal  Vital Signs with: PMSV  Heart Rate: 88  Respiratory Rate: 18  Oxygen Saturation: 93-94%   End of session, PMSV: left in place. Pt able to demonstrate independent placement & removal of PMSV. Reviewed indications for removal.     Speech Treatment [phone]  Verbalizations: with PMSV on trach  Length of Utterance: sentence level in conversation  Intelligibility: 80% repairing breakdowns with writing  Describe Voice: strong, hyponasal  End of session, cap on trach.   Inroduced strategies for increasing speech intelligibility including over-exaggerating articulatory movements, opening mouth wide, slightly slowed rate. Patient receptive to strategies and feedback and was able to implement strategies throughout session with mod clinician cueing. Patient encouraged to use speech to communicate needs when possible and write to repair communication breakdown.    IMPRESSIONS:  Tolerance of PMSV: excellent    RECOMMENDATIONS:    PMSV Use and Guidelines  Length: during waking hours, as tolerated  Placement/Removal: Independent  Benefits: voice production, secretion management, weaning, improve quality of life   Guidelines: remove when sleeping/napping, remove if short of breath, remove if 02 decreases, wash in warm soapy water  Tracheotomy  Tube/Ventilator Recommendations: may consider capping trial after MBS. Pt tolerated prolonged digital occlusion of trach >2 mins today with VSS, no c/o SOB, and no appreciable air trapping.     SLP Treatment: 2-5x wk for trials and tolerance, MBSS (planned for tomorrow per MD), home health SLP upon d/c    EDUCATION:  People educated: patient, nursing,  Information provided: results, recommendations, role of SLP, plan of care, PMSV guidelines  Level of understanding: adequate  Further education: required  Safety Guidelines: in chart, reviewed with patient    Short Term Goals:  Patient will tolerate PMSV on trach ad lib for 8 hours.  Patient will demonstrate independent placement and removal of PMSV (MET).  Patient will utilize recommended speech strategies on 8/10 opportunities with min clinician cueing.     Long Term Goal:  Patient will tolerate use of speaking valve during waking hours to facilitate tracheostomy weaning, verbal communication, upper airway use, swallow function  and management of oral secretions.    Pt will communicate personal wants and needs.    Pt left in bed c all needs met, call bell in reach, RN aware, bed alarm intact upon departure.  Jacklynn Lewis, M.S., [place]  Speech-Language Pathologist  Scheduling: [phone]  Fax: [phone]  Pager [phone]    Electronically signed by Jacklynn Lewis, [place] at 10/03/2020  4:40 PM EDT

## 2020-10-03 NOTE — Progress Notes (Signed)
Formatting of this note is different from the original.  Physical Therapy Inpatient Progress note  [facility] [place]    Patient Name: Caleb Robinson    Age: 69 y.o.    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 Pt agreeable to participate in therapy and reports he is eager to take a trip outside later with nursing. RN agreeable to treatment.  Patient's Goals: to return home    Pain:  Pre-treatment: other (see comments)  Post-treatment: other (see comments)  Comment: none reported    Precautions: Contact precautions, Drain, Fall risk, Free flap, Incision / suture site, IV, Woundvac, NGT, Oxygen, Tracheostomy, and CAM boot L LE and ND phase 1 protocol.  Left LE:  WBAT    Barriers to Learning: No known limitations    Objective     Cognitive Status  Orientation: Oriented x4  Level of Consciousness: Cooperative and Alert  Follows Commands: 100% of the time  Safety and Judgment: Intact    Treatment Performed:    Bed Mobility   -Supine to sit: Mod I w HOB elevated and use of bed rail.  -Sit to supine: Mod I w HOB elevated and verbal cues to sit toward crease of bed angle.    Transfers  -Sit to stand: SBA w verbal cues to push from bed w UE instead of using FWW. FWW used for balance once standing.    Balance: Seated static/dynamic, normal. Standing static/dynamic, good w FWW.  Gait:  Ambulated 250' w FWW, SBA and management of [place].    Therex: Initiated fibular flap protocol. Mild clonus noted with passive L dorsiflexion stretching. Hypertonic with light quick stretch. All precautions maintained throughout.    Initiated ND phase 1 protocol. Pt maintained precautions throughout. B shoulder flexion AROM limited just under 90 degrees. Decreased scapular motion. Pt denies pain throughout.    Education: Pt educated on POC, ND phase 1 precautions and HEP with handout provided. Educated on fibular flap protocol and HEP with handout provided.    Pt left in bed c all needs met, call bell in reach, RN aware, bed alarm intact and audible.    [facility] Inpatient Basic Mobility Short Form  How much difficulty does the patient currently have...  1. Turning over in bed (including adjusting bedclothes, sheets and blankets)? A Little  2. Sitting down on and standing up from a chair with arms (e.g., wheelchair, bedside commode, etc.) A Little  3. Moving from lying on back to sitting on the side of the bed? A Little    How much help from another person does the patient currently need...  4. Moving to and from a bed to a chair (including a wheelchair)? A Little  5. Need to walk in hospital room? A Little  6. Climbing 3-5 steps with a railing? A Lot    Raw Score: 17  T-score: 42.13    Assessment     Assessment: Pt performed all therex today without reports of pain and demonstrated adherence to protocol outlines. Pt ROM at B GHJ is limited less than precaution limits but will continue to address with HEP and manual assistance during sessions. Plans to follow up on HEP adherence for fib flap and ND HEPs as well as progress gait training and initiate stair negotiation.    Recommendations:  Discharge Disposition: home with home health, home with assist  Equipment Needed: other (see comments) (CTA. Possibly FWW pending progress)    Goals     Short Term Goals:  Pt will be I with all levels of bed mobility by 10/31  Pt will go  from supine in bed to OOB with STS I by 10/22  Pt will ambulate 100' with SPV by 10/31    Long Term Goal  Pt will ambulate 500' independently by 11/10    Plan     Frequency: 4-6 times per week     Interventions: Gait training therapy - 97116, Manual therapy - 97140, Neuromuscular reeducation - [phone], Therapeutic activities - 53976, Therapeutic exercises - [place]    Therapist: Loura Back  Pager ID: [phone] Kemp    I was present in the room and guided service delivery when the student was participating in the provision of services, and was not engaged in treating any other patients or any other task at the time of service delivery. I am responsible for the plan of care and assessment for this patient.     Therapist: Lorelee Market, PT, DPT  Pager ID: [phone]      Electronically signed by Lorelee Market, PT, DPT at 10/03/2020  1:43 PM EDT

## 2020-10-03 NOTE — Unmapped (Signed)
Formatting of this note is different from the original.    Physician?s Documentation Request Pt Name: Caleb Robinson [facility]  MR #: [id number]  Acct #: [id number]  Unit/Bed:  Adm Date: 09/27/2020  5:40 AM   Reviewer: Willis Modena  Ext:     Query Date: 10/03/2020      Documentation Clarification  By submitting this query, we are merely seeking further clarification of documentation to accurately reflect all conditions that you are monitoring, evaluating, treating or extend the length of stay or utilize additional hospital resources.  Please utilize your independent clinical judgment when addressing the question(s) below.    Documentation Requirements    Dear Doctor  or current provider,     The RD consult was cosigned on 10/21 agreeing that the pt meets criteria for severe malnutrition. However, this diagnosis is not included in any current progress note. Would you please consider adding this to your next note and DC summary so that it can be captured and coded correctly.       For details on CDI Query responses click here:  Provider Response to a CDI Shared Note Tip Sheet    Agreed and will add to documentation and/or amend documentation    *This Form is Not a Permanent Document in the Medical Record*  PLEASE DOCUMENT ANY ADDITIONAL [place] NOTES     Electronically signed by Willis Modena at 10/06/2020  8:11 AM EDT

## 2020-10-03 NOTE — Progress Notes (Signed)
Formatting of this note might be different from the original.  Consult for PMSV eval received and appreciated.  Eval completed.  Pt appropriate for ad lib PMSV wear at this time. He is able to place & remove PMSV independently. SLP to f/u 1-5 x weekly.  D/C Rec: Aline, M.S., [place]  Speech-Language Pathologist  Scheduling: [phone]  Fax: [phone]  Pager [phone]    Electronically signed by Jacklynn Lewis, [place] at 10/03/2020  2:23 PM EDT

## 2020-10-03 NOTE — Consults (Signed)
Associated Order(s): IP Consult Smoking Cessation Program  Formatting of this note might be different from the original.  IP Consult Smoking Cessation Program  Consult performed by: Jan Fireman  Consult ordered by: Tonita Phoenix, MD  Assessment/Recommendations: Please see note from Durene Cal dated 10/03/2020. Thank you for the consult.    Jan Fireman, PhD, CTTS, [place]  Pager: [phone]      Electronically signed by Jan Fireman at 10/03/2020 10:42 AM EDT

## 2020-10-04 NOTE — Progress Notes (Signed)
Formatting of this note might be different from the original.  Plan of Care:   Nutrition reassessment complete.  Please see progress note from same date for complete assessment and interventions.      Lamont Dowdy, MS, Dietetic Intern   Pager: [phone]    Electronically signed by Alvino Blood, RD at 10/04/2020  2:17 PM EDT

## 2020-10-04 NOTE — Progress Notes (Signed)
Formatting of this note is different from the original.  Nutrition  [facility]    Patient Name: Caleb Robinson  Age: 69 y.o.  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 Pts UBW is 110#, pt has lost 15#(14%) x 3 mo, which is clinically significant. Pt reports everything is going well with his current tube feeding regimen and that he hopes to be able to eat by mouth soon. Pt endorces diarrhea this AM. Pt denies any N/V, constipation, or pain with intake.     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    Pain affecting intake:?Patient denies    Hospital Course:  Caleb Robinson is a 68 y.o. male who presents with a history of HTN, COPD, tobacco abuse abd SCC of the tongue. Now s/p partial glossectomy, segmental mandibulectomy with left fibula FTT and STSG, bilateral ND, tracheostomy. DHT for EN placed 10/21. MBSS today - final results pending. +hyperchloremia. +hyperglycemia, +hypocalcemia.    Medications/Vitamins/Herbals/Supplements  Scheduled Meds:  ? acetaminophen  650 mg Per OG/NG Tube 4 times per day   ? amLODIPine  5 mg Per OG/NG Tube Daily   ? aspirin  325 mg Per OG/NG Tube Daily   ? banana flakes-t-galactooligos.  10.75 g Per OG/NG Tube TID   ? budesonide-formoterol  2 puff Inhalation BID   ? celecoxib  200 mg Oral BID   ? enoxaparin  40 mg Subcutaneous Q24H   ? famotidine  20 mg Oral BID   ? gabapentin  300 mg Per  OG/NG Tube 3 times per day   ? ipratropium-albuterol  3 mL Inhalation Q6H [place]   ? melatonin  5 mg Oral QHS   ? mirtazapine  15 mg Per OG/NG Tube QHS   ? nicotine  1 patch Transdermal Daily   ? prenatal vitamin  1 tablet Per OG/NG Tube Daily   ? protein supplement  30 mL Per OG/NG Tube Daily   ? rosuvastatin  10 mg Oral Daily   ? vancomycin  125 mg Per OG/NG Tube 4 times per day     Continuous Infusions:   ? ropivacaine (PF) (NAROPIN) 0.2 % in NaCl 0.9% 400 mL ON-Q (PAINBUSTER) irrigation pump 5 mL/hr (09/27/20 1939)   ? ropivacaine (PF) (NAROPIN) 0.2 % in NaCl 0.9% 750 mL ON-Q (PAINBUSTER SELECT-A-FLOW) irrigation 10 mL/hr (09/27/20 1939)   PRN Meds:docusate sodium, hydrocortisone, HYDROmorphone, ipratropium-albuterol, ondansetron, oxycodone, promethazine, sennosides    Labs  Lab Results   Component Value Date    NA 144.0 10/03/2020    K 4.1 10/03/2020    CL 112 (H) 10/03/2020    CO2CT 21 (L) 10/03/2020    BUN 22 10/03/2020    CREATININE 0.8 10/03/2020    GLUCOSE 186.0 (H) 10/03/2020    CALCIUM 8.2 (L) 10/03/2020    AST 21 09/27/2020    ALT 16 09/27/2020    ALKPHOS 74 09/27/2020  PROT 5.5 (L) 09/27/2020     Lab Results   Component Value Date    WBC 18.02 (H) 10/03/2020    HGB 11.2 (L) 10/03/2020    HCT 33.5 (L) 10/03/2020    PLT 240 10/03/2020    NEUTROABS 5.89 09/12/2020     No results found for: CHOL, TRIG, HDL, LDLCALC  Lab Results   Component Value Date    TSH 3.33 10/02/2020    HGBA1C 4.7 09/29/2020    VITAMINB12 895 09/12/2020    FOLATE 14.2 09/12/2020     No results found for: GLUCOSEU, PROTEINUA, BILIRUBINUR, UROBILINOGEN, LABPH, BLOODU, KETONESU, NITRITE, LEUKOCYTESUR, LABSPEC, COLORU    Nutrition Focused Physical Exam  Overall Appearance: No acute distress  +DHT  Dentition/Oral:   WDL  Cognitive/Neuro:   A/O x 4  Edema:  none  Skin: Incision L leg: WDL, Incision bilateral: WDL drain/device, flap   GI/Abdominal Appearance: rounded, distended  Last BM: 10/27, liquid     Anthropometric Measurements  Wt  Readings from Last 3 Encounters:   09/27/20 (!) 43.1 kg (95 lb 0.3 oz)   09/12/20 (!) 42.9 kg (94 lb 9.6 oz)   08/28/20 45.9 kg (101 lb 4.8 oz)     Estimated body mass index is 16.31 kg/m as calculated from the following:    Height as of this encounter: 162.6 cm (5\' 4" ).    Weight as of this encounter: 43.1 kg (95 lb 0.3 oz).  Usual weight: 110# / 50 kg  Admit weight: 42.91 kg  Weight Change: 14% wt loss x 3 mo    Estimated Nutritional Needs: Anabolism  Based on 43.1 kg   GOAL RANGE METHOD/FORMULA   Energy 1295 - 1510 kcals/day 30 - 35 kcal/kg   Protein 65 - 86 gm/day 1.5 - 2 gm/kg    Fluid 1295 - 1510 ml/day 1 ml/kcal or per provider     Nutrition Interventions   Formula / solution (ND-2.1.1)  Feeding tube flush (ND-2.1.4)   Collaboration/referral to other providers (RC-2.1)     Enteral Nutrition  Access: DHT tube  Formula: Nutren 1.5   Goal Rate: 42 mL/hr  Flush: 30 mL Q4h  Provides:   1500 kcal / day 35 kcal / kg / day 169 gm CHO / day   80 gm Pro/ day 1.9 gm protein / kg / day 747 mL free water     Enteral Nutrition (Bolus/Home)  Access: DHT tube  Formula:  Nutren 1.5  Bolus Volume: 258mL  Bolus Frequency: Q6H  Flush before and after each bolus: 64mL  Protein Modular: Prosource 27mL daily   Provides:   1560 kcal / day 36 kcal / kg / day 176 gm CHO / day   83 gm Pro/ day 1.9 gm protein / kg / day 764 mL free water     Nutrition Goals   Therapeutic Diet: tolerate least restrictive diet, EN / PN: receive >80 % of prescribed kcalories / protein, EN: tolerate at goal rate without any difficulties reported and Constipation / Diarrhea: achieve regular BM pattern    Monitoring and Evaluation   Total energy intake (FH-1.1.1.1)   Weight (AD-1.1.2)   Weight change (AD-1.1.4)   Electrolyte and renal profile   (BD-1.2)  Overall appearance   (PD-1.1.1), Extremities, muscles and bones   (PD-1.1.4), Digestive system (mouth to rectum)   (PD-1.1.5) and Skin   (PD-1.1.8)    Nutrition Plan:   1. Diet advancement as medically  appropriate per SLP.   2. Add  ONS pending diet advancement.   3. If pt to discharge home on enteral nutrition and insurance unable to cover EN consider the following over the counter regimen: 4 cartons Boost Plus (or equivalent formula) daily + either 30 ml ProStat daily or 1 scoops of pure whey protein powder daily (assuming each scoop provides ~20g of protein).  4. If pt is to discharge home on TF, please place referral to Outpatient Dietitian: [facility] or pt preferred provider for monitoring and surveillance of nutrition support.   5. Weekly weights   6. Continue RD will continue to monitor pt progress toward nutrition goals and follow per policy.     Lamont Dowdy, MS, Dietetic Intern   Pager: [phone]    Agree with above assessment and plan. Orders entered and acknowledged by MD in EPIC.    Maryella Shivers, MS, RDN, LD  Pager [phone]       Electronically signed by Alvino Blood, RD at 10/04/2020  2:17 PM EDT

## 2020-10-04 NOTE — Progress Notes (Signed)
Formatting of this note is different from the original.     10/04/20 1000   Interdisciplinary Collaboration   Participants case manager; charge nurse; social work/services; speech language pathology; dietitian/nutrition services; physician     Pt had trach change on 10/25;  successful capping trial on 10/26. Plan swallow study today with consideration for decannulation if tolerated.  [facility] referred per pt request.    Theophilus Kinds [facility]  [facility]: [phone]  Page: [phone]    [phone]:  Spoke with [facility] re: referral;  Stated they did not receive, so it was re-fax'd along with [place] orders to the fax number they provided  [phone]).    Theophilus Kinds NCM    Electronically signed by Reola Calkins [place] at 10/04/2020  3:16 PM EDT

## 2020-10-04 NOTE — Progress Notes (Signed)
Formatting of this note might be different from the original.  For all Team C patient questions from 6am-5pm during the week please contact the Team C intern at [phone] or their pager. On weekdays between 5pm-6am and weekends please first attempt to contact the intern on call at [phone] or, secondarily, the junior ENT resident on call.    Otolaryngology--Head and Neck Surgery Progress Note    Patient Name: Caleb Robinson  Date: 10/04/2020  Admission Date: 09/27/2020  Service: Otolaryngology--Head and Neck Surgery    Subjective:   AFVSS. On 6 L FiO2 28% via TC. BM x 3.  SLP deemed PMSV appropriate.    Objective:  General: NAD  HEENT: supple neck with midline trachea, surgical incision sites c/d/i, JP in place to bulb suction with ss output. Intraoral flap with some epidermolysis but good turgor, strong Arterial signals, Venous signal more quiet, still with good cap refill and no evidence of congestion. 6-0 DCT capped, some expected postoperative edema. DHT.   CV: pulses intact, regular rate  Resp: no increased work of breathing on TC  Ext: no cyanosis or swelling or rashes. Donor site under CAM boot, wound vac in place.    Neuro: EOMI    Assessment:   69 y.o. male with PMH of anterior OC SCCa, active smoker with COPD who is 7 Days Post-Op s/p 69yo M w/  now s/p Comp OC w/ segmental mandibulectmy, partial gloss, b/l ND, trach, L fibula free flap, STSG. Vessels: L Facial A, retromandibular V.     Neuro:   - Multimodal pain control    HEENT:  - No pillows, trach ties around neck  - Routine JP care: Please strip JP q2h. Empty and record output q4h and prn. Notify MD for significantly increasing output or significant change in color/consistency   -RN flap checks q2h while in HDU  - BID MD flap checks   - Bacitracin ointment to incisions BID until POD#3. POD#4 and after clean incision with a 50/50 mixture of hydrogen peroxide and saline, and apply Vaseline ointment to incisions BID  - Cap trach today  - MBSS  today    CV/Heme:  - Continue tele  - Notify MD if Systolic blood pressure > 170.   - Notify MD for sustained pulse > 110 while pain is well controlled  - ASA 325    Pulm:   - Wean O2 to keep sats >92%    FENGI:  - Replete electrolytes to keep K >4.0, Mg>2.0  - Diet NPO diet OTHER (COMMENTS)  Nutren 1.5   - Bowel regimen senna/colace (held), banana flakes     GU:  - Notify MD if UOP <30cc/h    ID:   - s/p unasyn course   - PO Vanc course for C. Diff  - Probiotics    ENDO:   - SSI as appropriate    PPx:   - DVT: LVX 30 BID  - GI: H2     MSK:   - Will follow PT/OT   - Cast to remain in place     Dispo  - HDU    [name]  Resident Physician - PGY3  Otolaryngology - Head & Neck Surgery  Pager [phone]      Electronically signed by Nunzio Cobbs, MD at 10/04/2020  6:18 AM EDT

## 2020-10-04 NOTE — Progress Notes (Signed)
Formatting of this note is different from the original.  Images from the original note were not included.  [place]  Speech-Language Pathology Charting Report  Modified Barium Swallow    Date of Service: 10/04/2020  Patient: Caleb Robinson  MRN: [id number]  CSN: [id number]  DOB: [birth year]  Age: 69 y.o.    Attending Provider: Roney Marion, MD   Referring Provider: Donne Hazel, NP  Location of Treatment: [facility]  Date Therapy Plan Established [phone]): 10/03/20  Date Patient First Became Aware of Symptoms (11): 09/27/20  Date Service Initiated by Billing Provider (85): 10/03/20  Funding: Payor: [employer] MEDICARE COMPLETE /  /  /   Primary Diagnosis:   1. Hypovolemic shock    2. Dysphonia    3. Cancer of oral cavity    4. Oral phase dysphagia      Treatment Diagnosis:   1. Hypovolemic shock    2. Dysphonia    3. Cancer of oral cavity    4. Oral phase dysphagia      Patient Problem List [ICD-10]    Diagnosis Date Noted   ? C. difficile colitis [A04.72]    ? Primary hypertension [I10]    ? Chronic obstructive pulmonary disease [J44.9]    ? Acute blood loss anemia [D62]    ? Hypotension after procedure [I95.81]    ? On enteral nutrition [Z78.9]    ? Hypokalemia [E87.6]    ? Hypomagnesemia [E83.42]    ? Tobacco abuse [Z72.0]    ? Hypovolemic shock [R57.1]    ? 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   Activity Status: Skill not obtained at this time  Nutritional Screen: Patient is NPO  Barriers to Learning: No Barriers    PERTINENT HISTORY     History of Present Illness:  Caleb Robinson is a 69 y.o. male with a history of  anterior OC SCCa, active smoker with COPD who is 9 Days Post-Op s/p 69yo M w/  now s/p Comp OC w/ segmental mandibulectmy, partial  gloss, b/l ND, trach, L fibula free flap, STSG on 09/27/20. He was referred for an MBSS to evaluate safety to initiate PO.   The patient was alone in the session.    Patient's stated goal is have tube removed    Past Medical History: The patient  has no past medical history on file.   Past Surgical History: The patient  has a past surgical history that includes pr excis tongue,mouth,jaw,rad neck (Bilateral, 09/27/2020); pr rad resect tumor soft tiss neck/ant thorax 5+cm (Right, 09/27/2020); pr tracheostomy, planned (N/A, 09/27/2020); pr bone-skin graft, microvascular (N/A, 09/27/2020); pr split grft trunk,arm,leg <100 sqcm (Left, 09/27/2020); pr reconstruc mouth complex (Bilateral, 09/27/2020); pr hyoid myotomy & suspension (Bilateral, 09/27/2020); and pr exploration n/flwd surg neck artery (09/27/2020).   Social History: The patient  reports that he has been smoking cigarettes. He has been smoking about 0.25 packs per day. He has never used smokeless tobacco. He reports current alcohol use. He reports that he does not use drugs.   Relevant Imaging:  NA    Prior SLP Services: seen for PMSV    EVALUATION     Current Respiratory Status:  room air, tracheotomy tolerating capping  Current Communication:  Verbal at  the conversation level but speech marginally intelligible    Current (pre-MBS) Intake/Diet:  ? Functional Oral Intake Scale (FOIS): 1 - Tube Dependent: no oral intake  ? Intake status: DHT  ? Diet grade/IDDSI Level:  NA - Patient NPO  Weight:    Wt Readings from Last 3 Encounters:   09/27/20 (!) 43.1 kg (95 lb 0.3 oz)   09/12/20 (!) 42.9 kg (94 lb 9.6 oz)   08/28/20 45.9 kg (101 lb 4.8 oz)   ?   ? BMI: 16.31    Oral Mechanism/Cranial Nerve Exam:  Patient participated in oral mechanism/cranial nerve examination.     Oral structures:  Lips:  lower lip displaced by surgery  Cheek:  Normal in appearance  Tongue/FOM:  surgical resection/reconstruction  Mandible:  surgical resection/reconstruction  Maxilla: Normal  in appearance  Palate: unable to visualize  Dentition: edentulous    Condition of oral cavity:  Post-surgical changes, Incisions, Pooling mucus/saliva    CN V Trigeminal-   Gross facial sensation: Reduced  Observation of jaw movement:  Jaw midline, Trismus; max oral opening 73mm     CN VII Facial -   Taste:  unable to assess at this time  Facial Symmetry: symmetric    Lips:    Labial strength: Reduced  Labial range of motion: Reduced  Buccal puff: Reduced  Drooling: Present    CN IX  Glossopharyngeal -   Lingual lateralization: unable to visualize remaining tongue   Lingual elevation:  unable to visualize remaining tongue     CN X Vagus -   Palatal elevation:  unable to visualize  Vocal quality: gurgly    GRBAS Scale   0 to 3 (0 = normal, 1 = mild, 2 = moderate, 3 = severe).     Grade: 1  Roughness: 1  Breathiness: 0  Asthenia: 1  Strain: 1  Total: 3/15    Volitional swallow:  present    CN XII Hypoglossal -   The tongue was assessed for structure, symmetry, range of motion and strength:     Surgical changes      Modified Barium Swallow  (CPT 92611: Motion fluoroscopic evaluation of swallowing function by cine or video recording)    NOTE: this study was performed for interpretation only of the oropharyngeal and pharyngoesophageal stages of swallowing.  It is not intended to diagnose any other radiologic abnormalities or substitute for a formal esophagram study.        The patient was positioned seated in Hausted chair with slight recline with left lateral viewing planes captured.  The patient was presented with graduated volumes of commercially prepared/standardized barium consistencies of thin liquids via syringe. Fluoroscope pulse rate was set to 30 p/sec. Video capture rate was 30 FPS.    Unable to adequately assess swallow secondary to limited ability of patient to accept bolus proesentation.  Multiple attempts made with positioning of patient and catheter within oral cavity.  Patient with posterior tongue/flap  bulk preventing bolus acceptance.  See images below    IMAGES     TREATMENT     Swallow Therapy   (CPT 92526: Treatment of swallowing dysfunction and/or oral function for feeding):    The patient was seen bedside following MBS to train catheter placement for attempts of PO using water trials.  Patient with anterior loss of majority of bolus    IMPRESSIONS     The patient presents with severely impaired swallowing function at this time with textures presented secondary to flap bulk  prohibiting adequate access for bolus placement resulting in anterior loss of bolus Prognosis for meeting nutritional needs by mouth is guarded at this time. Results of this evaluation and a plan of care were shared with the patient and managing team, via verbal instruction and demonstration.   Understanding was expressed via teach back.    RECOMMENDATIONS     Intake Recommendations:  ? Route: NPO with alternate nutrition  ? Diet grade/IDDSI Level: NA - Patient NPO  ? Functional Oral Intake Scale (FOIS): 1 - Tube Dependent: no oral intake    Therapy Recommendations:  ? Recommend water trials with catheter extension in attempt to improve bolus acceptance    GOALS     Long Term Goals:   The patient will safely tolerate ora bolus trials   The patient will demonstrate improved swallowing function via repeat clinical evaluation, videoendoscopy/videofluoroscopy, and patient self-rating scores.   Short-Term Goals:    The patient will be able to recall safety guidelines with no more than minimal cuing.   SLP goals: Initiated   The patient will verbalize/demonstrate understanding of evaluation results, recommendations, and the swallowing guidelines.   In progress    The patient will be re-evaluated on an ongoing basis to determine the appropriate plan of care following each therapy session. Upon the tenth swallowing therapy session or upon discharge, the patient will be formally re-evaluated and a new plan of care established.  Thank you for  this consult and the opportunity to care for this patient. Should you have any questions or need any additional information, please do not hesitate to contact me by calling the [facility] for [facility] Sw[facility] at [phone]. Thank you.  Lorie Phenix, MA.CCC-SLP, BCS-S  Speech-Language Pathologist  Board Certified Specialist in Swallowing and Swallowing Disorders  Scheduling: [phone]  Office:  [phone]  Fax: [phone]  Pager [phone]    Dr. Erenest Blank was made aware of the results of the exam and recommendations above via verbal report.  ______________________________________________________________________    Physician's Statement:  In accordance with [facility]/Medicare guidelines, I certify (by co-signing this document) the need for these services furnished under this plan of treatment and while under my care.         Electronically signed by Lorie Phenix, [place] CCC-SLP at 10/06/2020  8:46 AM EDT

## 2020-10-04 NOTE — Progress Notes (Signed)
Formatting of this note is different from the original.  HOSPITALIST CONSULT PROGRESS NOTE    Patient: Caleb Robinson  Date of service: 10/04/2020  Date of admission:  09/27/2020  Service/attending: ENT-C / Nolberto Hanlon    Active Problems:  Patient Active Problem List   Diagnosis   ? Cancer of oral cavity   ? Hypovolemic shock   ? Chronic obstructive pulmonary disease   ? Acute blood loss anemia   ? Hypotension after procedure   ? On enteral nutrition   ? Hypokalemia   ? Hypomagnesemia   ? Tobacco abuse   ? C. difficile colitis   ? Primary hypertension     Subjective     Pt trach capped and pt sates breathing without difficulty but is having trouble clearing his cough due to "swollen tongue." Denies cp, dizziness, palpitations, sob, abdominal pain or n/v.    Objective     Vitals:  Blood pressure 147/54, pulse 88, temperature 36.7 C (98.1 F), temperature source Axillary, resp. rate 17, height 162.6 cm (5\' 4" ), weight (!) 43.1 kg (95 lb 0.3 oz), SpO2 93 %.    Physical Exam:  GENERAL: frail appearing, no acute distress.  EYES:  Extraocular movements are intact.  Sclerae nonicteric.  ENT: Postsurgical changes. DHT sutured to nare   NECK: Trach capped. Incision intact, j/p and doppler wires in place   CARDIOVASCULAR: RRR. No murmurs, rubs, or gallops.  RESPIRATORY: Normal respiratory effort. Lungs are clear bilaterally  GASTROINTESTINAL: Abdomen is soft, nontender, and nondistended with normal bowel sounds.  MUSCULOSKELETAL: wound vac to  LLE  NEURO/PSYCH:  Pleasant and cooperative. Alert and oriented.  Motor and sensory function grossly intact and symmetrical in all four extremities.    PERTINENT LABS:  Lab Results   Component Value Date    WBC 18.02 (H) 10/03/2020    HGB 11.2 (L) 10/03/2020    HCT 33.5 (L) 10/03/2020    MCV 93.8 10/03/2020    PLT 240 10/03/2020    NEUTROABS 5.89 09/12/2020     Lab Results   Component Value Date    NA 144.0 10/03/2020    K 4.1 10/03/2020    CL 112 (H) 10/03/2020    CO2CT 21 (L)  10/03/2020    BUN 22 10/03/2020    CREATININE 0.8 10/03/2020    GLUCOSE 186.0 (H) 10/03/2020    CALCIUM 8.2 (L) 10/03/2020    MG 1.9 10/03/2020    AST 21 09/27/2020    ALT 16 09/27/2020    ALKPHOS 74 09/27/2020    PROT 5.5 (L) 09/27/2020     Lab Results   Component Value Date    INR 1.24 (H) 09/27/2020     Lab Results   Component Value Date    TSH 3.33 10/02/2020    HGBA1C 4.7 09/29/2020    VITAMINB12 895 09/12/2020    FOLATE 14.2 09/12/2020     No results found for: GLUCOSEU, PROTEINUA, BILIRUBINUR, UROBILINOGEN, LABPH, BLOODU, KETONESU, NITRITE, LEUKOCYTESUR, LABSPEC, COLORU  Lab Results   Component Value Date    INR 1.24 (H) 09/27/2020     No results found for: GLUF  No results found for: PHENYTOIN, PHENOBARB, CBMZ, LITHIUM  No results found for: CHOL, TRIG, HDL, LDLCALC  No results found for: SEDIMENTATIO, CRPLOWSENSIT  Recent Labs     10/01/20  1220 10/01/20  1714 10/01/20  2355 10/02/20  0548 10/02/20  1148 10/03/20  0550   GLUCOSEPCXWH 88.0 113.0* 104.0* 81.0 82.0 94.0  No results for input(s): FINALREPORT, PRELIMINARYR in the last 72 hours.    RADIOLOGY:  XR Chest/Upper Abdomen NG Tube Plcmnt   Final Result   IMPRESSION:     Nasoenteric feeding catheter tip overlies the stomach.     Dictated by: Jacki Cones, MD. 09/27/2020 10:30 PM     I, Thornton Dales, MD, have reviewed the study and agree with the findings in   this report.  09/28/2020 6:38 AM     XR Chest AP Portable    (Results Pending)   Fluoro Video Swallow W Speech    (Results Pending)     MEDICATIONS:   Scheduled Meds:  ? acetaminophen  650 mg Per OG/NG Tube 4 times per day   ? amLODIPine  5 mg Per OG/NG Tube Daily   ? aspirin  325 mg Per OG/NG Tube Daily   ? banana flakes-t-galactooligos.  10.75 g Per OG/NG Tube TID   ? budesonide-formoterol  2 puff Inhalation BID   ? celecoxib  200 mg Oral BID   ? enoxaparin  40 mg Subcutaneous Q24H   ? famotidine  20 mg Oral BID   ? gabapentin  300 mg Per OG/NG Tube 3 times per day   ? ipratropium-albuterol  3  mL Inhalation Q6H [place]   ? melatonin  5 mg Oral QHS   ? mirtazapine  15 mg Per OG/NG Tube QHS   ? nicotine  1 patch Transdermal Daily   ? prenatal vitamin  1 tablet Per OG/NG Tube Daily   ? protein supplement  30 mL Per OG/NG Tube Daily   ? rosuvastatin  10 mg Oral Daily   ? vancomycin  125 mg Per OG/NG Tube 4 times per day     Continuous Infusions:  ? ropivacaine (PF) (NAROPIN) 0.2 % in NaCl 0.9% 400 mL ON-Q (PAINBUSTER) irrigation pump 5 mL/hr (09/27/20 1939)   ? ropivacaine (PF) (NAROPIN) 0.2 % in NaCl 0.9% 750 mL ON-Q (PAINBUSTER SELECT-A-FLOW) irrigation 10 mL/hr (09/27/20 1939)     PRN Meds:.docusate sodium, hydrocortisone, HYDROmorphone, ipratropium-albuterol, ondansetron, oxycodone, promethazine, sennosides    ASSESSMENT & PLAN:  Caleb Robinson is a 69 y.o. 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     Perioperative Management: Recommend early ambulation, PT/OT, RT for airway clearance. Wound mgt, pain control, rehab, and DVT PPX per primary team.     C Diff Colitis: diarrhea improved, no abdominal pain   - continue vancomycin per peg (end 11/3)    HTN: BP well controlled   - continue amlodipine 5mg  daily    COPD/Tobacco Use: no respiratory complains. Pt smoked cigarettes up to surgery date.  - continue symbicort BID and duonebs as needed  - continue nicotine patch  - visited with smoking cessation counselor, pt is encouraged to quit    HLD:  - continue rosuvastatin daily     Mood Disorder/Insomnia:  - continue mirtazapine qHS    Severe Malnutrition: meets criteria with severe weight loss, BMI 16  - continue RD to optimize nutrition     Ppx: Per primary team    Please call with questions or concerns.    Romualdo Bolk, NP  Pager: [phone]      Electronically signed by Patricia Pesa, FNP at 10/04/2020  9:40 AM EDT

## 2020-10-04 NOTE — Progress Notes (Signed)
Formatting of this note might be different from the original.    Problem: Adult Inpatient Plan of Care  Goal: Plan of Care Review  Outcome: Ongoing, Progressing    Electronically signed by Trula Slade, RN at 10/04/2020  4:26 AM EDT

## 2020-10-04 NOTE — Progress Notes (Signed)
Formatting of this note might be different from the original.    Problem: Adult Inpatient Plan of Care  Goal: Plan of Care Review  Outcome: Ongoing, Progressing  Flowsheets (Taken 10/04/2020 1036)  Plan of Care Reviewed With: patient    Electronically signed by Laren Everts, RN at 10/04/2020 10:37 AM EDT

## 2020-10-04 NOTE — Progress Notes (Signed)
Formatting of this note might be different from the original.  [place] [facility] [place]    Patient Name: Caleb Robinson    Age: 69 y.o.      Sex: male      MRN: [id number]     Date: 10/04/2020  Admission Date: 09/27/2020      Reason Treatment Not Performed:  pt off unit for swallow study     Therapist: Rondel Oh, PT  Pager ID: [phone]      Electronically signed by Rondel Oh, PT at 10/04/2020 11:40 AM EDT

## 2020-10-05 NOTE — Unmapped (Signed)
Formatting of this note might be different from the original.  [facility] Code/MET Documentation Note    Code Status Verified at Onset of CPR:  Code Status Confirmed on Arrival:  Type: MET Called    MET Documentation:    Indication for MET: Tachycardia and Mental Status Change  Respiratory Assessment:  Respiratory Suspect:  Hypotension:  Hypotension Suspect:  Mental Status Change: Other Vital Signs Reassuring  Mental Status Change Suspect: Sepsis  Hypertension:  Hypertension Suspect:  Sepsis:  Sepsis Suspect:  Chest Pain:  Chest Pain Suspect:    MET Recommendations: 12 Lead EKG, CXR, Labs Sent, Suctioning, Antibiotics and IV Fluid Bolus    MET Narrative: Called to bedside for decreased alertness, tachycardia into 110s. Patient usually very alert, active per nursing staff, but slower to respond this AM. On oral Vanc for C. Diff colitis. Concern for sepsis given known infection, altered mental status but no focal deficits. Discussed CT Head, but given no focal deficits and slightly improving mental status without intervention, will hold off for now. On VTE ppx, O2 sat fine on RA, so less concern for PE.    ABG with 7.46/35/84/24. EKG with sinus tach, no ST changes.     Broadened to IV Vanc/Zosyn, gave 1L LR bolus, obtaining CXR, XR abdomen. Trop, CBC ordered.     MET Outcome: Remained on Floor (Collaboration with Primary Team Members Regarding Recommendations/Treatments/Follow-Up Plan)    Code Documentation:    Initial Cardiac Rhythm:  Approximate Duration on CPR: Minutes  Total Time Elapsed Before ROSC: Minutes  Code Interventions:    Code Narrative:    Code Outcome:    Post ROSC Care:  Targeted Temperature Management Initiated:    Other Interventions Initiated:  Debriefing Performed:  Attending Physician Notified:  Date/Time Attending MD Notified:    Code/MET Sign Off Statement:    Additional Documentation:    Karle Plumber  October 05, 2020  8:53 AM    Electronically signed by Karle Plumber, MD at 10/05/2020  8:58  AM EDT

## 2020-10-05 NOTE — Progress Notes (Signed)
Formatting of this note might be different from the original.  For all Team C patient questions from 6am-5pm during the week please contact the Team C intern at [phone] or their pager. On weekdays between 5pm-6am and weekends please first attempt to contact the intern on call at [phone] or, secondarily, the junior ENT resident on call.    Otolaryngology--Head and Neck Surgery Progress Note    Patient Name: Caleb Robinson  Date: 10/05/2020  Admission Date: 09/27/2020  Service: Otolaryngology--Head and Neck Surgery    Subjective:   AFVSS. On RA. Slight increase in  WBC yesterday (17.8 to 18). BM x 2. Tolerated cap trial. MBSS completed, no intratracheal aspiration or laryngeal penetration with thin barium on the  one swallow, following multiple attempts. SLP said posterior tongue causes impedance of PO intake, requires further sessions. Wound vac removed.    Objective:  General: NAD  HEENT: supple neck with midline trachea, surgical incision sites c/d/i, JP in place to bulb suction with ss output. Intraoral flap with some epidermolysis but good turgor, strong Arterial signals, Venous signal more quiet, still with good cap refill and no evidence of congestion. 6-0 DCT capped, some expected postoperative edema. DHT.   CV: pulses intact, regular rate  Resp: no increased work of breathing on TC  Ext: no cyanosis or swelling or rashes. Donor site under CAM boot, wound vac in place.    Neuro: EOMI    Assessment:   69 y.o. male with PMH of anterior OC SCCa, active smoker with COPD who is 8 Days Post-Op s/p 69yo M w/  now s/p Comp OC w/ segmental mandibulectmy, partial gloss, b/l ND, trach, L fibula free flap, STSG. Vessels: L Facial A, retromandibular V.     Neuro:   - Multimodal pain control    HEENT:  - No pillows, trach ties around neck  - Routine JP care: Please strip JP q2h. Empty and record output q4h and prn. Notify MD for significantly increasing output or significant change in color/consistency   -RN flap checks q2h  while in HDU  - BID MD flap checks   - Bacitracin ointment to incisions BID until POD#3. POD#4 and after clean incision with a 50/50 mixture of hydrogen peroxide and saline, and apply Vaseline ointment to incisions BID  - Trach capped  - Purse string suture to JP drain  - PO trials with SLP  - Will discuss decannulation     CV/Heme:  - Continue tele  - Notify MD if Systolic blood pressure > 170.   - Notify MD for sustained pulse > 110 while pain is well controlled  - ASA 325    Pulm:   - Wean O2 to keep sats >92%    FENGI:  - Replete electrolytes to keep K >4.0, Mg>2.0  - Diet NPO diet OTHER (COMMENTS)  Nutren 1.5   - Bowel regimen senna/colace (held), banana flakes     GU:  - Notify MD if UOP <30cc/h    ID:   - s/p unasyn course   - PO Vanc course for C. Diff  - Probiotics    ENDO:   - SSI as appropriate    PPx:   - DVT: LVX 30 BID  - GI: H2     MSK:   - Will follow PT/OT   - Cast to remain in place     Dispo  - HDU    [name]  Resident Physician - PGY3  Otolaryngology - Head & Neck  Surgery  Pager [phone]      Electronically signed by Nunzio Cobbs, MD at 10/05/2020  6:02 AM EDT

## 2020-10-05 NOTE — Progress Notes (Signed)
Formatting of this note might be different from the original.  Received notice late yesterday afternoon that [facility] was unable to accept pt's referral for care due to staffing shortages.  Notified pt's friend Eustaquio Maize, who also requested referrals placed to [facility] [place] and [facility].  Referrals placed and waiting confirmation if either can accept care.    Theophilus Kinds [facility]  [facility]: [phone]  Page: [phone]    Electronically signed by Reola Calkins Wysong at 10/05/2020  1:16 PM EDT

## 2020-10-05 NOTE — Progress Notes (Signed)
Formatting of this note is different from the original.  Caleb Robinson is a(n) 69 y.o. male initiating Vancomycin therapy.    Antibiotic Indication: bacteremia  Temp (24hrs), Avg:36.7 C (98 F), Min:36.3 C (97.3 F), Max:37.1 C (98.8 F)    Recent Labs     10/03/20  0115 10/05/20  0205   WBC 18.02* 13.87*     Therapeutic Goal: AUC: 400-600 mg*h/L    Renal Function: Stable renal function  Recent Labs     10/03/20  0115 10/05/20  0205 10/05/20  0837   CREATININE 0.8 0.7 0.7   BUN 22 22 24      @[email or web address] @    Recommended Dosing: Loading dose vancomycin 1000mg  X1 followed by Vancomycin 750mg  q24h starting at 0900 tomorrow   Ht Readings from Last 1 Encounters:   09/27/20 162.6 cm (5\' 4" )     Wt Readings from Last 1 Encounters:   09/27/20 (!) 43.1 kg (95 lb 0.3 oz)     Ideal body weight: 60.8 kg (134 lb 1.8 oz)    Predicted Pharmacokinetics: Vd 26L, ke 0.0555hr-1, T1/2 12.5h, CL 1.4L/h, AUC 522, Cmax 38.71mcg/mL, Cmin 10.6mcg/mL    Recommended Monitoring: Check 2 level(s) before the 4th dose on Sunday    Remer Macho  Contact Number: [phone]    Electronically signed by Remer Macho, PharmD at 10/05/2020  8:57 AM EDT

## 2020-10-05 NOTE — Consults (Signed)
Associated Order(s): IP CONSULT TO ADULT VASCULAR NURSING  Formatting of this note might be different from the original.  PIV placed by floor staff  Electronically signed by Aurelio Brash, RN at 10/05/2020 11:22 AM EDT

## 2020-10-05 NOTE — Progress Notes (Signed)
Formatting of this note is different from the original.     10/05/20 1000   Interdisciplinary Collaboration   Participants case manager; charge nurse; nurse; social work/services; speech language pathology; dietitian/nutrition services; physician     Pt with slightly elevated WBC count past 24H;  pt passed capping trials yesterday and MBSS done with SLP concerns that pt's tongue blocks food bolus from passing.  SLP to continue working with pt;  RAPID called this morning for AMS.  Sepsis workup initiated; Vancomycin added to medication regimen.    Caleb Robinson [facility]  [facility]: [phone]  Page: [phone]      Electronically signed by Reola Calkins Wysong at 10/05/2020  1:32 PM EDT

## 2020-10-05 NOTE — Progress Notes (Signed)
Formatting of this note might be different from the original.    Problem: Adult Inpatient Plan of Care  Goal: Plan of Care Review  Outcome: Ongoing, Progressing  Goal: Patient-Specific Goal (Individualization)  Outcome: Ongoing, Progressing    Electronically signed by Ihor Austin, RN at 10/05/2020  4:05 AM EDT

## 2020-10-05 NOTE — Nursing Note (Signed)
Formatting of this note might be different from the original.  0730 Bedside shift report completed. No doppler box at bedside, unable to assess. Discussed with Dr. Gilford Rile. MD to discuss decreasing flap checks. Discussed concerns re active c.diff precautions and concerns about infection.     0803 Primary Team paged Mar-Mac. Pls come to bedside to assessment, Charge Nurses Day and Night and Primary concerned about changes. Thanks, Claudine Mouton, RN [phone]    Nursing Concerns: Newly lethargic, Newly Tachycardic, apical pulse irregular. Labored breathing, with new oxygen demands. EKG obtained. ABG ordered.     [id number] Nursing at bedside.   Rapid Called, Team to bedside. Recs:   - pt placed on 2L NC (to remained capped at this time)  -Troponin  - blood cultures  - ua/uc  - resp cultures  - CT/PE  - new IV obtained  - IV abx started  - tele ordered    1420  Troponin trending down from 44 to 37    1553 Dr. Erenest Blank was paged  62- [place], tele called, pt had 11 beats of V-tach. Getting stips from tele and another EKG and VS. What action do you want? Cards c/s? Thanks, Claudine Mouton, RN [phone]    1600  EKG, labs obtained. MD to bedside, Rapid Nurse to bedside. Rapid Nurse rec ICU consult after labs obtained. Intern to discuss with Team re plan.     1700  Pt to remain on the floor at this time, continue to monitor. Nursing moved him closer to nursing station.     1900  troponin trending down from 37to 32. K resent ( hemolysis was previously sample). Bedside shift report with flap check (no dopplers, missing box). Sign out to Dr. Gilford Rile that nursing is still concerned about his mental status being so lethargic.   Electronically signed by Cheri Fowler, RN at 10/05/2020  8:10 PM EDT

## 2020-10-05 NOTE — Progress Notes (Signed)
Formatting of this note is different from the original.  HOSPITALIST CONSULT PROGRESS NOTE    Patient: Caleb Robinson  Date of service: 10/05/2020  Date of admission:  09/27/2020  Service/attending: ENT-C / Nolberto Hanlon    Active Problems:  Patient Active Problem List   Diagnosis   ? Cancer of oral cavity   ? Hypovolemic shock   ? Chronic obstructive pulmonary disease   ? Acute blood loss anemia   ? Hypotension after procedure   ? On enteral nutrition   ? Hypokalemia   ? Hypomagnesemia   ? Tobacco abuse   ? C. difficile colitis   ? Primary hypertension     Subjective     Sleepy today.  Tolerating TF.  Denies SOB.      Objective     Vitals:  Blood pressure 110/52, pulse 105, temperature 36.8 C (98.2 F), temperature source Axillary, resp. rate 26, height 162.6 cm (5\' 4" ), weight (!) 43.1 kg (95 lb 0.3 oz), SpO2 95 %.    Physical Exam:  GENERAL: frail appearing, no acute distress.  EYES:  Extraocular movements are intact.  Sclerae nonicteric.  ENT: Postsurgical changes. DHT sutured to nare   NECK: Trach capped. Incision intact, j/p and doppler wires in place   CARDIOVASCULAR: RRR. No murmurs, rubs, or gallops.  RESPIRATORY: Normal respiratory effort. Lungs are clear bilaterally  GASTROINTESTINAL: Abdomen is soft, nontender, and nondistended with normal bowel sounds.  MUSCULOSKELETAL: wound vac to  LLE  NEURO/PSYCH:  Pleasant and cooperative. Alert and oriented.  Motor and sensory function grossly intact and symmetrical in all four extremities.    PERTINENT LABS:  Lab Results   Component Value Date    WBC 20.74 (H) 10/05/2020    HGB 10.8 (L) 10/05/2020    HCT 32.5 (L) 10/05/2020    MCV 95.0 (H) 10/05/2020    PLT 351 10/05/2020    NEUTROABS 5.89 09/12/2020     Lab Results   Component Value Date    NA 141.0 10/05/2020    K 4.7 10/05/2020    CL 108 10/05/2020    CO2CT 24 10/05/2020    BUN 24 10/05/2020    CREATININE 0.7 10/05/2020    GLUCOSE 169.0 (H) 10/05/2020    CALCIUM 8.1 (L) 10/05/2020    MG 1.9 10/05/2020     AST 21 09/27/2020    ALT 16 09/27/2020    ALKPHOS 74 09/27/2020    PROT 5.5 (L) 09/27/2020     Lab Results   Component Value Date    INR 1.24 (H) 09/27/2020     Lab Results   Component Value Date    TSH 3.33 10/02/2020    HGBA1C 4.7 09/29/2020    VITAMINB12 895 09/12/2020    FOLATE 14.2 09/12/2020     No results found for: GLUCOSEU, PROTEINUA, BILIRUBINUR, UROBILINOGEN, LABPH, BLOODU, KETONESU, NITRITE, LEUKOCYTESUR, LABSPEC, COLORU  Lab Results   Component Value Date    INR 1.24 (H) 09/27/2020     No results found for: GLUF  No results found for: PHENYTOIN, PHENOBARB, CBMZ, LITHIUM  No results found for: CHOL, TRIG, HDL, LDLCALC  No results found for: SEDIMENTATIO, CRPLOWSENSIT  Recent Labs     10/03/20  0550 10/05/20  0822   GLUCOSEPCXWH 94.0 180.0*     No results for input(s): FINALREPORT, PRELIMINARYR in the last 72 hours.    RADIOLOGY:  XR Chest AP Portable   Final Result   IMPRESSION:    Mild interstitial edema and pulmonary vascular  redistribution which could be   seen with increased right heart pressures.   Right basilar airspace opacification could represent  infection or aspiration.     Findings were discussed with Dr. Welton Flakes by Dara Hoyer, MD on 10/05/2020 9:48   AM.      Dictated by: Dara Hoyer, MD. 10/05/2020 9:53 AM     I, Larose Kells, MD, have reviewed the study and agree with the findings in   this report.  10/05/2020 10:21 AM     XR Abdomen AP   Final Result   IMPRESSION:     No evidence of bowel obstruction.     Findings were discussed with Dr. Erenest Blank by Johnn Hai, MD on 10/05/2020 9:50   AM.     Dictated by: Johnn Hai, MD. 10/05/2020 9:59 AM     I, Thornton Dales, MD, have reviewed the study and agree with the findings in   this report.  10/05/2020 10:38 AM     Fluoro Video Swallow W Speech   Final Result   IMPRESSION:     1. No intratracheal aspiration or laryngeal penetration with thin barium on the   one swallow, following multiple attempts.      2. Please see speech pathology  report for full details.     Dictated by: Bess Harvest, MD. 10/04/2020 11:34 AM     I, Marni Griffon, MD, have reviewed the study and agree with the findings in   this report.  10/04/2020 12:23 PM     XR Chest/Upper Abdomen NG Tube Plcmnt   Final Result   IMPRESSION:     Nasoenteric feeding catheter tip overlies the stomach.     Dictated by: Jacki Cones, MD. 09/27/2020 10:30 PM     I, Thornton Dales, MD, have reviewed the study and agree with the findings in   this report.  09/28/2020 6:38 AM     XR Chest AP Portable    (Results Pending)   CT Chest Pulmonary Embolism W Contrast    (Results Pending)     MEDICATIONS:   Scheduled Meds:  ? acetaminophen  650 mg Per OG/NG Tube 4 times per day   ? amLODIPine  5 mg Per OG/NG Tube Daily   ? aspirin  325 mg Per OG/NG Tube Daily   ? banana flakes-t-galactooligos.  10.75 g Per OG/NG Tube TID   ? budesonide-formoterol  2 puff Inhalation BID   ? celecoxib  200 mg Oral BID   ? enoxaparin  40 mg Subcutaneous Q24H   ? famotidine  20 mg Oral BID   ? gabapentin  300 mg Per OG/NG Tube 3 times per day   ? ipratropium-albuterol  3 mL Inhalation Q6H [place]   ? melatonin  5 mg Oral QHS   ? mirtazapine  15 mg Per OG/NG Tube QHS   ? nicotine  1 patch Transdermal Daily   ? piperacillin-tazobactam  4.5 g Intravenous Q8H   ? prenatal vitamin  1 tablet Per OG/NG Tube Daily   ? protein supplement  30 mL Per OG/NG Tube Daily   ? rosuvastatin  10 mg Oral Daily   ? vancomycin  125 mg Per OG/NG Tube 4 times per day   ? [START ON 10/06/2020] vancomycin  750 mg Intravenous Q24H     Continuous Infusions:    PRN Meds:.docusate sodium, hydrocortisone, HYDROmorphone, ipratropium-albuterol, ondansetron, oxycodone, promethazine, sennosides    ASSESSMENT & PLAN:  Mr. Horsch is a 69 y.o. 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     Perioperative  Management: Recommend early ambulation, PT/OT, RT for airway clearance. Wound mgt, pain control, rehab, and DVT PPX per primary team.     C Diff Colitis: diarrhea improved, no abdominal pain   - continue vancomycin 125mg  qid per peg (end 11/3)  -WBC 20.7 today.  Peak 27 on 10/23 but then improved to 13.8 early today.  Monitor closely.     HTN: BP well controlled   - continue amlodipine 5mg  daily    COPD/Tobacco Use: no respiratory complains. Pt smoked cigarettes up to surgery date.  - continue symbicort BID and duonebs as needed  - continue nicotine patch 21mg   - visited with smoking cessation counselor, pt is encouraged to quit    HLD:  - continue rosuvastatin daily     Mood Disorder/Insomnia:  - continue mirtazapine qHS    Severe Malnutrition: meets criteria with severe weight loss, BMI 16  - continue RD to optimize nutrition     Ppx: Per primary team    Please call with questions or concerns.    Quintella Reichert, MD  10/05/2020  12:10 PM      Electronically signed by Meredith Mody, MD at 10/05/2020  3:01 PM EDT

## 2020-10-05 NOTE — Progress Notes (Signed)
Formatting of this note might be different from the original.  At about 8:00, was paged to evaluate patient due to tachycardia, altered mental status from baseline, and generalized weakness. Upon arrival, RN and charge nurse were present and in process of obtaining EKG, which revealed sinus tachycardia and occasional PVCs. Patient was able to follow commands and state name and current location. Had no complaints other than "feeling bad." Abdomen was tender on palpation. Vitals were remarkable for HR 100-105, RR 26-28, O2 sat 95% on RA, and BP wnl. Rapid team called, stat ABG obtained (7.46/35/84). Infectious disease workup was pursued, including CXR, Abd x-ray, CBC, lactate, blood cultures x 2, urine culture, sputum culture, UA. Trop collected and patient placed on telemetry. Antibiotics broadened to IV Vanc and Zosyn. Given 1 L LR bolus. Placed on 2 L NC.    Significant findings are summarized below:  WBC: 20.7 (elevated from this AM at 13.8)  Trop: 44 (repeat 37)  Lactate: 1  CXR: Mild interstitial edema and pulmonary vascular redistribution which could be  seen with increased right heart pressures.  Right basilar airspace opacification could represent  infection or aspiration.  Abd X ray: no evidence of bowel obstruction    To follow up on CXR findings, CTPE obtained, which showed no PE but concern for aspiration PNA.    With above interventions, vital signs improved with tachycardia and tachypnea resolving. Patient appeared more oriented though still not at baseline.    At 1600, RN notified me that 11 beats of V-tach picked up on telemetry. Patient was otherwise asymptomatic and vital signs remained stable. No further episodes were encountered.   Electronically signed by Jason Fila, MD at 10/05/2020  7:23 PM EDT

## 2020-10-06 NOTE — Progress Notes (Signed)
Formatting of this note might be different from the original.  Patient pulled DHT this morning. MD made aware on morning rounds.    Problem: Adult Inpatient Plan of Care  Goal: Plan of Care Review  Outcome: Ongoing, Progressing  Goal: Optimal Comfort and Wellbeing  Outcome: Ongoing, Progressing    Problem: Communication Impairment (Artificial Airway)  Goal: Effective Communication  Outcome: Ongoing, Progressing    Electronically signed by Neta Mends, RN at 10/06/2020  6:22 AM EDT

## 2020-10-06 NOTE — Progress Notes (Signed)
Formatting of this note might be different from the original.  For all Team C patient questions from 6am-5pm during the week please contact the Team C intern at [phone] or their pager. On weekdays between 5pm-6am and weekends please first attempt to contact the intern on call at [phone] or, secondarily, the junior ENT resident on call.    Otolaryngology--Head and Neck Surgery Progress Note    Patient Name: Caleb Robinson  Date: 10/06/2020  Admission Date: 09/27/2020  Service: Otolaryngology--Head and Neck Surgery    Subjective:   AFVSS. On 1.5 L FiO2 28% via TC. Rapid called yesterday AM due to AMS and tachycardia. Infectious, cardiac, abdominal, and pulm workup pursued. Significant largely for WBC 20, neg PE, RRL consolidation likely aspiration PNA. Received 1 L bolus, abx broadened to vanc/zosyn. BC x 1 now growing E. coli    Objective:  General: NAD  HEENT: supple neck with midline trachea, surgical incision sites c/d/i, JP in place to bulb suction with ss output. Intraoral flap with some epidermolysis but good turgor, strong Arterial signals, Venous signal more quiet, still with good cap refill and no evidence of congestion. 6-0 CFS uncapped, some expected postoperative edema.   CV: pulses intact, regular rate  Resp: no increased work of breathing on TC  Ext: no cyanosis or swelling or rashes. Donor site under CAM boot, wound vac in place.    Neuro: EOMI    Assessment:   69 y.o. male with PMH of anterior OC SCCa, active smoker with COPD who is 9 Days Post-Op s/p 69yo M w/  now s/p Comp OC w/ segmental mandibulectmy, partial gloss, b/l ND, trach, L fibula free flap, STSG. Vessels: L Facial A, retromandibular V.     Neuro:   - Multimodal pain control    HEENT:  - No pillows, trach ties around neck  - Routine JP care: Please strip JP q2h. Empty and record output q4h and prn. Notify MD for significantly increasing output or significant change in color/consistency   -RN flap checks q2h while in HDU  - BID MD flap  checks   - Bacitracin ointment to incisions BID until POD#3. POD#4 and after clean incision with a 50/50 mixture of hydrogen peroxide and saline, and apply Vaseline ointment to incisions BID  - Trach uncapped, hold off on decannulation given PNA  - PO trials with SLP  - Replace DHT  - Remove wires and R neck JP    CV/Heme:  - Continue tele  - Notify MD if Systolic blood pressure > 170.   - Notify MD for sustained pulse > 110 while pain is well controlled  - ASA 325    Pulm:   - Wean O2 to keep sats >92%    FENGI:  - Replete electrolytes to keep K >4.0, Mg>2.0  - Diet NPO diet OTHER (COMMENTS)  Nutren 1.5   - Bowel regimen senna/colace (held), banana flakes     GU:  - Notify MD if UOP <30cc/h    ID:   - s/p unasyn course   - PO Vanc course for C. Diff  - Zosyn/Vanc for PNA  - E. Coli on 1/2 blood cx 10/28  - Consult ID  - Sputum culture negative  - Probiotics    ENDO:   - SSI as appropriate    PPx:   - DVT: LVX 30 BID  - GI: H2     MSK:   - Will follow PT/OT   - Cast to remain in  place     Dispo  - HDU    [name]  Resident Physician - PGY3  Otolaryngology - Head & Neck Surgery  Pager [phone]      Electronically signed by Nunzio Cobbs, MD at 10/06/2020  6:22 AM EDT

## 2020-10-06 NOTE — Progress Notes (Signed)
Formatting of this note is different from the original.  Physical Therapy Inpatient Progress note  [facility]    Patient Name: Caleb Robinson    Age: 69 y.o.    Funding: Payor: [employer] MEDICARE COMPLETE /  /  /   Sex: male    Episode Visit Count: 4  MRN: [id number]   Date: 10/06/2020  Admission Date: 09/27/2020      Attending Provider: Roney Marion, MD   Date Service Initiated by Billing Provider (35): 09/27/20  Date Patient First Became Aware of Symptoms (11): 09/27/20  Date Therapy Plan Established (29): 09/29/20  Date of Service: 10/06/20  Date of Surgery: 09/27/20    Primary Diagnosis: Cancer of oral cavity [C06.9]  Treatment Diagnosis: Generalized deconditioning and acute post-op pain    Total Treatment Time:  40  Timed Codes:  40    Subjective     Subjective: Pt is stating discomfort from boot but agreeable to therapy  Patient's Goals: to return home     Pain:  Pre-treatment: other (see comments)  Post-treatment: other (see comments)  Comment: none reported     Precautions: Drain, Fall risk, Incision / suture site, IV, Telemetry, and Tracheostomy    Barriers to Learning: No known limitations    Objective     Cognitive Status  Orientation: Oriented x4  Level of Consciousness: Cooperative  Follows Commands: 100% of the time  Safety and Judgment: Intact    Treatment Performed:    Bed Mobility   -Rolling: Pt is I with bed mobility   -Supine to sit: I for supine to sit   -Sit to supine: I for sit to supine     Transfers  -Sit to stand: x2 to FWW with verbal cues for hip extension and using UE to push up from bed instead of grabbing walker     Balance: normal balance throughout   Gait: Pt ambulated 10' to [facility] and back but plans to ambulate in hallway limited by  need for BM     Education: Pt educated on importance of continuing to ambulate for endurance    Pt left in bed c all needs met, call bell in reach, RN aware, bed alarm intact and audible.    [facility] Inpatient Basic Mobility  Short Form  How much difficulty does the patient currently have...  1. Turning over in bed (including adjusting bedclothes, sheets and blankets)? A Little  2. Sitting down on and standing up from a chair with arms (e.g., wheelchair, bedside commode, etc.) A Little  3. Moving from lying on back to sitting on the side of the bed? A Little    How much help from another person does the patient currently need...  4. Moving to and from a bed to a chair (including a wheelchair)? A Little  5. Need to walk in hospital room? A Little  6. Climbing 3-5 steps with a railing? A Lot    Raw Score: 17  T-score: 42.13      Assessment     Assessment: Pt did not appear to have any changes in function despite medical occurrences over the last few days. Pt still requires little to no assistance and despite not ambulating in hallway due to BM, ambulation around room looks safe and effective. Continue to progress functional endurance and independence.    Recommendations:  Discharge Disposition: home with assist, home with home health  Equipment Needed: other (see comments)    Goals  Short Term Goals:  Pt will be I with all levels of bed mobility by 10/31  Pt will go from supine in bed to OOB with STS I by 10/22  Pt will ambulate 100' with SPV by 10/31    Long Term Goal  Pt will ambulate 500' independently by 11/10    Plan     Frequency: 4-6 times per week     Interventions: Gait training therapy - 97116, Therapeutic activities - 68115, Therapeutic exercises - [place]    Therapist: Altha Harm     I was present in the room and guided service delivery when the student was participating in the provision of services, and was not engaged in treating any other patients or any other task at the time of service delivery. I am responsible for the plan of care and assessment for this patient.     Therapist: Lorelee Market, PT, DPT  Pager ID: [phone]      Electronically signed by Lorelee Market, PT, DPT at 10/06/2020  2:15 PM EDT

## 2020-10-06 NOTE — Progress Notes (Signed)
Formatting of this note is different from the original.  [facility]  Occupational Therapy Daily Note    Patient: Caleb Robinson   MRN: [id number]   DOB: 09-30-1951     Date of Service: 10/06/2020     Visit: 2  ROOM: D782/A    Location of Treatment: Bedside  Funding: Payor: [employer] MEDICARE COMPLETE /  /  /      Discipline Providing Care: OT    Anticipated discharge disposition: home with assist  Equipment Needed:      Precautions: Drain, Fall risk, Incision / suture site, IV, and Tracheostomy    Barriers to Learning: No known limitations    Subjective     Subjective: Pt & RN agreeable to OT tx.     Pre-Treatment Pain Rating:0/10 - no pain  Post-Treatment Pain Rating:0/10 - no pain  Pre/Post Treatment Pain Comment: Pt reports no pain    Objective     Cognitive Status:   Orientation:  Oriented x4  Level of consciousness:  Cooperative and Alert  Follows commands and answers:  75% of the time  Personal safety and judgement:  Intact  *Pt benefits from cues on how to ambulate & transfer safely with FWW.     Treatment Performed Today:     ADL Training:   - don/doff sock & cowboy boot sitting EOB: mod I  - don/doff gown over back: mod I  - toilet hygiene (from BM) in standing: CGA    Functional Transfer Training:   - supine <> sit: independent   - sit <> stand: CGA + FWW. Pt benefits from cues for hip extension & to push with 1UE from bed instead of grabbing FWW to push up.   - BSC t/f: CGA    Patient Family Education: OT Role, OT POC, importance of OOB mobility     Pt left semi supine in bed with all needs met, call bell in reach, RN aware, bed alarm intact.    [facility] Inpatient Daily Activity Short Form  How much help from another person does the patient current need...  1. Putting on and taking off regular lower body clothing? A Little  2. Bathing? A Little  3. Toileting, including toilet, bedpan, or urinal? A Little  4. Putting on and taking off regular upper body clothing? A Little  5.  Taking care of personal grooming? A Little  6. Eating meals? Total    Raw Score: 16  T-Score: 35.96      Patients with a T-score a 39.4 or greater indicates increased accuracy in prediction of discharge to home.  Jette et al.     Untimed Codes: 0  Timed Codes: 40  Total treatment time: 40    Goals     Short Term Goals:   11/5, pt will demonstrate 3 standing grooming tasks SPV   by 11/5, pt will demonstrate BSC tf SPV adhering  by 11/5, pt will demonstrate LB dressing SPV *Goal Met 10/06/20* *New Goal* By 11/12, Pt will don pants over hips in standing with mod I          Long Term Goals:   by 11/19, pt will be mod I in morning ADL routine        Assessment     Summary: Pt & RN agreeable to OT tx. Pt making great progress with therapy, meeting short term goal of performing LBD with spv. Pt performed functional transfers with CGA & LBD with mod I compared  to requiring increased assistance with previous sessions. Pt will continue to benefit from IP OT to maximize independence with ADLs & safety with functional transfers. Anticipate progression to home with assist once medically stable.     Plan     Therapy frequency: 1-3 times per week  Therapeutic Interventions: Self care mngment training - [phone], Therapeutic exercises - 93810, Therapeutic activities - 17510    Bing Ree, OTS    I was present in the room and guided service delivery when the student was participating in the provision of services, and was not engaged in treating any other patients or any other task at the time of service delivery.  I am responsible for the plan of care and assessment for this patient.    Therapist: Dyke Maes, OTR/L  Pager: [phone]      Electronically signed by Dyke Maes, OT at 10/06/2020  4:08 PM EDT

## 2020-10-06 NOTE — Nursing Note (Signed)
Formatting of this note might be different from the original.  Page to MD [place] Ant/FOM appears opened/reddened.   Electronically signed by Darden Amber, RN at 10/06/2020  3:46 PM EDT

## 2020-10-06 NOTE — Nursing Note (Signed)
Formatting of this note might be different from the original.  Page to MD Erenest Blank - Pt lost DHT overnight. Need orders to reinsert with CXR to verify.   Electronically signed by Darden Amber, RN at 10/06/2020  8:51 AM EDT

## 2020-10-06 NOTE — Consults (Signed)
Formatting of this note is different from the original.  [place]    Date of Service: 10/06/2020  Admission Date: 09/27/2020  5:40 AM  Consulting Service: Infectious Diseases     Service requesting consult: Roney Marion, MD    REASON FOR THE CONSULT: Positive BC for e. Coli with possible aspiration pneumnia    HPI     69 y.o. 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 Coli positive BC and possible aspiration pneumonia. The patient underwent a mandibulectomy and currently has a trach and is feeding through an NG tube. On the morning of 10/28 the patient developed tachycardia and altered mental status. At this time he had severe abdominal pain and possible urinary retention. The primary team was concerned for an infectious etiology vs a pulmonary embolism.  Infectious evaluation as well as repeat imaging were ordered.  One of the blood cultures returned with e. Coli. A CTPE returned with concern for RLL consolidation possibly and aspiration pneumonia. During this period the patient remained afebrile and has not needed increased supplemental oxygen.     Of note patient has difficulty at times urinating and could be retaining.  He reports specifically a time that his nurses were trying to move him and he had acute onset of abdominal pain especially in his pelvis with bloating severe enough that he thought he was pregnant.  He needed to be straight cathed and he had a large amount of urine returned.    Of note the patient was diagnosed with C. Diff on 10/24 with improving bowel movements..  He reports that this is his first episode.  The patient communicates by writing on a white board during our evaluation.    PAST HISTORY     I have reviewed patient's medications, allergies, past medical, surgical, family, and social history in Epic and updated as appropriate.   Additional information is below.    ROS   Review of Systems   Constitutional: Positive for weight loss. Negative for fever.   HENT: Positive for sore throat.    Respiratory: Positive for shortness of breath. Negative for hemoptysis and wheezing.    Cardiovascular: Negative for chest pain and leg swelling.   Gastrointestinal: Positive for abdominal pain (mild) and diarrhea.   Genitourinary: Negative for dysuria, frequency and urgency.   Neurological: Negative for headaches.   Psychiatric/Behavioral: Positive for hallucinations (visual, worse at night).     MEDS     ? acetaminophen  650 mg Per OG/NG Tube 4 times per day   ? amLODIPine  5 mg Per OG/NG Tube Daily   ? aspirin  325 mg Per OG/NG Tube Daily   ? banana flakes-t-galactooligos.  10.75 g Per OG/NG Tube TID   ? budesonide-formoterol  2 puff Inhalation BID   ? celecoxib  200 mg Oral BID   ? enoxaparin  40 mg Subcutaneous Q24H   ? famotidine  20 mg Oral BID   ? gabapentin  300 mg Per OG/NG Tube 3 times per day   ? ipratropium-albuterol  3 mL Inhalation Q6H [place]   ? melatonin  5 mg Oral QHS   ? mirtazapine  15 mg Per OG/NG Tube QHS   ? nicotine  1 patch Transdermal Daily   ? piperacillin-tazobactam  4.5 g Intravenous Q8H   ? prenatal vitamin  1 tablet Per OG/NG Tube Daily   ?  protein supplement  30 mL Per OG/NG Tube Daily   ? rosuvastatin  10 mg Oral Daily   ? vancomycin  125 mg Per OG/NG Tube 4 times per day   ? vancomycin  750 mg Intravenous Q24H     ALLERGIES     Review of patient's allergies indicates:   Allergen Reactions   ? Cat dander Swelling     Objective   PHYSICAL EXAM  BP 106/49 (BP Location: Left arm, Patient Position: Lying)   Pulse 91   Temp 37.2 C (99 F) (Axillary)   Resp 18   Ht 162.6 cm (5\' 4" )   Wt (!) 43.1 kg (95 lb 0.3 oz)   SpO2 95%   BMI 16.31 kg/m   GENERAL: A&Ox3, NAD, unable to talk but able to write responses  HEENT: left neck surgical incision healing without purulent drainage. 2 JP drains out of the neck with serosanguinous  fluid. Tracheostomy  LUNGS: Bilateral crackles with bronchial breath sounds heard throughout lung fields.   HEART: Regular rate and rhythm; no murmur, rub, or gallop  ABDOMEN: +BS, no tympany, soft, NT/ND, no rebound, guarding  EXTREMITIES: no edema of right leg. Boot on left leg with a JP drain with serosanguinous fluid.   SKIN: no rashes or lesions  NEURO: CN II-XII grossly intact   PSYCH: cooperative, normal mood and affect    LABS     Lab Results   Component Value Date    WBC 17.67 (H) 10/06/2020    HGB 10.0 (L) 10/06/2020    HCT 30.0 (L) 10/06/2020    MCV 94.0 10/06/2020    PLT 385 10/06/2020     Lab Results   Component Value Date    CREATININE 0.8 10/06/2020    BUN 26 10/06/2020    NA 144.0 10/06/2020    K 4.4 10/06/2020    CL 110 (H) 10/06/2020     Lab Results   Component Value Date    ALT 63 (H) 10/05/2020    AST  10/05/2020      Comment:      No Result    ALKPHOS 222 (H) 10/05/2020    BILITOT 0.3 10/05/2020     IMAGING   CXR 10/05/20:  Mild interstitial edema and pulmonary vascular redistribution which could be  seen with increased right heart pressures.  Right basilar airspace opacification could represent  infection or aspiration.    CTPE 10/05/20:  No pulmonary embolism.  Right lower lobe consolidation and scattered areas of mucoid impaction, likely  aspiration pneumonia.  A few small pulmonary nodules measuring up to 8 mm. New left upper lobe nodule  measures 6 mm. The nodules are concerning for metastases.  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 Consider echocardiography for further evaluation.    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 Coli  10/05/20: Urine culture: negative    ASSESSMENT   69 y/o male with PMHx of COPD, oropharyngeal cancer s/p resection, and recent C. Diff infection who developed E. Coli positive blood  cultures. E. Coli bacteremia is unlikely to result from aspiration pneumonia. It is more likely that the patient developed this from either urinary retention or translocation from the GI tracts due to his recent C. Diff infection. Patient has not needed increased supplemental oxygen however imaging findings are concerning for aspiration pneumonia versus  pneumonitis.      1. E. Coli bacteremia likely secondary to urinary retention or colonic translocation  2. 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 C. Diff infection, first episode  4. Oropharyngeal cancer s/p resection     CrCl: Estimated Creatinine Clearance: 43.1 mL/min (by C-G formula based on SCr of 0.8 mg/dL).    RECOMMENDATIONS   1. D/c IV vancomycin    2. Continue IV zosyn 4.5 g every 8 hours for now until E. Coli sensitivities result.     3. If E. Coli is sensitive to ceftriaxone recommend switching to IV ceftriaxone 2g Q24hrs for a total of 14 days of treatment (end date would be 11/11). Which will also cover [place].     3. Continue PO vanc 125 mg Q6hrs for C. Diff treatment for a total of 10 days (end date of 11/15).     4. While on IV antibiotics he will need prophylaxis against C. Diff with 125 mg PO vanc once every three days. Starting 11/16.     5.  ID will follow pending cultures for final recs.    I have seen the patient at bedside and discussed the case with Dr. Kelby Fam  who agrees on the findings, assessment and plan unless otherwise specified below.    Thank you for the consult. ID will follow peripherally over the weekend.     Adrienne Mocha  10/06/2020     Fellow addendum:  I have seen and examined Roe Coombs in concert with Adrienne Mocha, Medical Student.  The above note, originally authored by [employer] has been modified as necessary by me to reflect my own, personal collection / validation of the patient's HPI, medical and surgical history, physical examination, and assessment and plan.    Antonietta Breach, DO  Infectious Diseases Fellow   Pager ID: [phone]  Date: 10/06/2020  Time: 7:39 PM     Patient seen and examined with [facility] and Dr. Guido Sander on 29 Oct.  I have reviewed and verified the history and physical exam.  I have personally reviewed the relevant labs and imaging.  I agree with the assessment and plans as outlined.    Wynn Banker. Ysidro Evert, MD MPH  Infectious Diseases Attending Physician      Electronically signed by Patrice Paradise, MD at 10/07/2020  6:03 PM EDT

## 2020-10-06 NOTE — Progress Notes (Signed)
Formatting of this note might be different from the original.    Problem: Adult Inpatient Plan of Care  Goal: Plan of Care Review  Outcome: Ongoing, Progressing  Goal: Patient-Specific Goal (Individualization)  Outcome: Ongoing, Progressing  Goal: Absence of Hospital-Acquired Illness or Injury  Outcome: Ongoing, Progressing  Goal: Optimal Comfort and Wellbeing  Outcome: Ongoing, Progressing  Goal: Readiness for Transition of Care  Outcome: Ongoing, Progressing  Goal: Rounds/Family Conference  Outcome: Ongoing, Progressing    Problem: Infection  Goal: Infection Symptom Resolution  Outcome: Ongoing, Progressing    Problem: Communication Impairment (Artificial Airway)  Goal: Effective Communication  Outcome: Ongoing, Progressing    Problem: Device-Related Complication Risk (Artificial Airway)  Goal: Optimal Device Function  Outcome: Ongoing, Progressing    Problem: Skin and Tissue Injury (Artificial Airway)  Goal: Absence of Device-Related Skin or Tissue Injury  Outcome: Ongoing, Progressing    Electronically signed by Darden Amber, RN at 10/06/2020  3:07 PM EDT

## 2020-10-06 NOTE — Progress Notes (Signed)
Formatting of this note might be different from the original.    Problem: Adult Inpatient Plan of Care  Goal: Plan of Care Review  Outcome: Ongoing, Progressing  Goal: Optimal Comfort and Wellbeing  Outcome: Ongoing, Progressing    Problem: Infection  Goal: Infection Symptom Resolution  Outcome: Ongoing, Progressing    Problem: Communication Impairment (Artificial Airway)  Goal: Effective Communication  Outcome: Ongoing, Progressing    Electronically signed by Neta Mends, RN at 10/06/2020  9:39 PM EDT

## 2020-10-06 NOTE — Progress Notes (Signed)
Formatting of this note is different from the original.  HOSPITALIST CONSULT PROGRESS NOTE    Patient: Caleb Robinson  Date of service: 10/06/2020  Date of admission:  09/27/2020  Service/attending: ENT-C / Nolberto Hanlon    Active Problems:  Patient Active Problem List   Diagnosis   ? Cancer of oral cavity   ? Hypovolemic shock   ? Chronic obstructive pulmonary disease   ? Acute blood loss anemia   ? Hypotension after procedure   ? On enteral nutrition   ? Hypokalemia   ? Hypomagnesemia   ? Tobacco abuse   ? C. difficile colitis   ? Primary hypertension     Subjective     Tolerating TF.  Denies SOB and CP.      Objective     Vitals:  Blood pressure 106/49, pulse 91, temperature 37.2 C (99 F), temperature source Axillary, resp. rate 18, height 162.6 cm (5\' 4" ), weight (!) 43.1 kg (95 lb 0.3 oz), SpO2 95 %.    Physical Exam:  GENERAL: frail appearing, no acute distress.  EYES:  Extraocular movements are intact.  Sclerae nonicteric.  ENT: Postsurgical changes. DHT sutured to nare   NECK: Trach capped. Incision intact, j/p and doppler wires in place   CARDIOVASCULAR: RRR. No murmurs, rubs, or gallops.  RESPIRATORY: Normal respiratory effort. Lungs are clear bilaterally  GASTROINTESTINAL: Abdomen is soft, nontender, and nondistended with normal bowel sounds.  MUSCULOSKELETAL: wound vac to  LLE  NEURO/PSYCH:  Pleasant and cooperative. Alert and oriented.  Motor and sensory function grossly intact and symmetrical in all four extremities.    PERTINENT LABS:  Lab Results   Component Value Date    WBC 17.67 (H) 10/06/2020    HGB 10.0 (L) 10/06/2020    HCT 30.0 (L) 10/06/2020    MCV 94.0 10/06/2020    PLT 385 10/06/2020    NEUTROABS 5.89 09/12/2020     Lab Results   Component Value Date    NA 144.0 10/06/2020    K 4.4 10/06/2020    CL 110 (H) 10/06/2020    CO2CT 30 (H) 10/06/2020    BUN 26 10/06/2020    CREATININE 0.8 10/06/2020    GLUCOSE 137.0 (H) 10/06/2020    CALCIUM 8.8 10/06/2020    MG 2.1 10/06/2020    AST   10/05/2020      Comment:      No Result    ALT 63 (H) 10/05/2020    ALKPHOS 222 (H) 10/05/2020    PROT 5.0 (L) 10/05/2020     Lab Results   Component Value Date    INR 1.24 (H) 09/27/2020     Lab Results   Component Value Date    TSH 3.33 10/02/2020    HGBA1C 4.7 09/29/2020    VITAMINB12 895 09/12/2020    FOLATE 14.2 09/12/2020     No results found for: GLUCOSEU, PROTEINUA, BILIRUBINUR, UROBILINOGEN, LABPH, BLOODU, KETONESU, NITRITE, LEUKOCYTESUR, LABSPEC, COLORU  Lab Results   Component Value Date    INR 1.24 (H) 09/27/2020     No results found for: GLUF  No results found for: PHENYTOIN, PHENOBARB, CBMZ, LITHIUM  No results found for: CHOL, TRIG, HDL, LDLCALC  No results found for: SEDIMENTATIO, CRPLOWSENSIT  Recent Labs     10/05/20  0822   GLUCOSEPCXWH 180.0*     No results for input(s): FINALREPORT, PRELIMINARYR in the last 72 hours.    RADIOLOGY:  XR Chest/Upper Abdomen NG Tube Plcmnt   Final Result  IMPRESSION:     Weighted feeding catheter tip overlies the stomach.     Findings were discussed with Dr. Erenest Blank by Bess Harvest, MD on 10/06/2020 1:22   PM.     Dictated by: Bess Harvest, MD. 10/06/2020 1:24 PM     I, Ernestina Patches, MD, have reviewed the study and agree with the findings in this   report.  10/06/2020 1:32 PM     CT Chest Pulmonary Embolism W Contrast   Final Result   IMPRESSION:      No pulmonary embolism.     Right lower lobe consolidation and scattered areas of mucoid impaction, likely   aspiration pneumonia.     A few small pulmonary nodules measuring up to 8 mm. New left upper lobe nodule   measures 6 mm. The nodules are concerning for metastases.     Marked thickening of the mitral valve leaflets with evidence of prolapse as   well as calcifications. Consider echocardiography for further evaluation.     Dictated by: Dellia Nims, MD,PhD. 10/05/2020 12:38 PM     I, Antony Blackbird, MD,PhD, have reviewed the study and agree with the findings in   this report.  10/05/2020 12:42 PM     XR Chest  AP Portable   Final Result   IMPRESSION:    Mild interstitial edema and pulmonary vascular redistribution which could be   seen with increased right heart pressures.   Right basilar airspace opacification could represent  infection or aspiration.     Findings were discussed with Dr. Welton Flakes by Dara Hoyer, MD on 10/05/2020 9:48   AM.      Dictated by: Dara Hoyer, MD. 10/05/2020 9:53 AM     I, Larose Kells, MD, have reviewed the study and agree with the findings in   this report.  10/05/2020 10:21 AM     XR Abdomen AP   Final Result   IMPRESSION:     No evidence of bowel obstruction.     Findings were discussed with Dr. Erenest Blank by Johnn Hai, MD on 10/05/2020 9:50   AM.     Dictated by: Johnn Hai, MD. 10/05/2020 9:59 AM     I, Thornton Dales, MD, have reviewed the study and agree with the findings in   this report.  10/05/2020 10:38 AM     Fluoro Video Swallow W Speech   Final Result   IMPRESSION:     1. No intratracheal aspiration or laryngeal penetration with thin barium on the   one swallow, following multiple attempts.      2. Please see speech pathology report for full details.     Dictated by: Bess Harvest, MD. 10/04/2020 11:34 AM     I, Marni Griffon, MD, have reviewed the study and agree with the findings in   this report.  10/04/2020 12:23 PM     XR Chest/Upper Abdomen NG Tube Plcmnt   Final Result   IMPRESSION:     Nasoenteric feeding catheter tip overlies the stomach.     Dictated by: Jacki Cones, MD. 09/27/2020 10:30 PM     I, Thornton Dales, MD, have reviewed the study and agree with the findings in   this report.  09/28/2020 6:38 AM     XR Chest AP Portable    (Results Pending)     MEDICATIONS:   Scheduled Meds:  ? acetaminophen  650 mg Per OG/NG Tube 4 times per day   ? amLODIPine  5 mg Per OG/NG Tube Daily   ?  aspirin  325 mg Per OG/NG Tube Daily   ? banana flakes-t-galactooligos.  10.75 g Per OG/NG Tube TID   ? budesonide-formoterol  2 puff Inhalation BID   ? celecoxib  200 mg Oral BID    ? enoxaparin  40 mg Subcutaneous Q24H   ? famotidine  20 mg Oral BID   ? gabapentin  300 mg Per OG/NG Tube 3 times per day   ? ipratropium-albuterol  3 mL Inhalation Q6H [place]   ? melatonin  5 mg Oral QHS   ? mirtazapine  15 mg Per OG/NG Tube QHS   ? nicotine  1 patch Transdermal Daily   ? piperacillin-tazobactam  4.5 g Intravenous Q8H   ? prenatal vitamin  1 tablet Per OG/NG Tube Daily   ? protein supplement  30 mL Per OG/NG Tube Daily   ? rosuvastatin  10 mg Oral Daily   ? vancomycin  125 mg Per OG/NG Tube 4 times per day   ? vancomycin  750 mg Intravenous Q24H     Continuous Infusions:    PRN Meds:.docusate sodium, hydrocortisone, HYDROmorphone, ipratropium-albuterol, ondansetron, oxycodone, promethazine, sennosides    ASSESSMENT & PLAN:  Mr. Janusz is a 69 y.o. 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     Perioperative Management: Recommend early ambulation, PT/OT, RT for airway clearance. Wound mgt, pain control, rehab, and DVT PPX per primary team.     C Diff Colitis: diarrhea improved, no abdominal pain   - continue vancomycin 125mg  qid per peg (end 11/3)  -WBC 17 today.  Peak 27 on 10/23.    HTN: BP borderline low.    - Amlodipine 5mg  discontinued 10/29    COPD/Tobacco Use: no respiratory complains. Pt smoked cigarettes up to surgery date.  - continue symbicort BID and duonebs as needed  - continue nicotine patch 21mg   - visited with smoking cessation counselor, pt is encouraged to quit    HLD:  - continue rosuvastatin daily     Mood Disorder/Insomnia:  - continue mirtazapine qHS    Severe Malnutrition: meets criteria with severe weight loss, BMI 16  - continue RD to optimize nutrition     Ppx: Per primary team    Please call with questions or concerns.    Quintella Reichert, MD  10/06/2020  1:59 PM      Electronically signed by Meredith Mody, MD at  10/06/2020  2:02 PM EDT

## 2020-10-07 NOTE — Progress Notes (Signed)
Formatting of this note might be different from the original.  For all Team C patient questions from 6am-5pm during the week please contact the Team C intern at [phone] or their pager. On weekdays between 5pm-6am and weekends please first attempt to contact the intern on call at [phone] or, secondarily, the junior ENT resident on call.    Otolaryngology--Head and Neck Surgery Progress Note    Patient Name: Caleb Robinson  Date: 10/07/2020  Admission Date: 09/27/2020  Service: Otolaryngology--Head and Neck Surgery    Subjective:   AFVSS. On TC 6L 28%. Cr 0.9 (0.8), BUN 31 (26). Leukocytosis improving 12.5 (17.7). IV Vanc d/c per ID, continued on PO. Pasteurella growing in trach cx. Jp2 (R neck) and wires removed. DHT replaced after falling out, x ray confirmed placement, OK to use.    Objective:  General: NAD  HEENT: supple neck with midline trachea, surgical incision sites c/d/i, JP in place to bulb suction with ss output. Intraoral flap with some epidermolysis but good turgor, strong Arterial signals, Venous signal more quiet, still with good cap refill and no evidence of congestion. 6-0 CFS uncapped, some expected postoperative edema.   CV: pulses intact, regular rate  Resp: no increased work of breathing on TC  Ext: no cyanosis or swelling or rashes. Donor site under CAM boot, wound vac in place.    Neuro: EOMI    Assessment:   69 y.o. male with PMH of anterior OC SCCa, active smoker with COPD who is 10 Days Post-Op s/p 69yo M w/  now s/p Comp OC w/ segmental mandibulectmy, partial gloss, b/l ND, trach, L fibula free flap, STSG. Vessels: L Facial A, retromandibular V.     Neuro:   - Multimodal pain control    HEENT:   -RN flap checks q4h  - BID MD flap checks   - Bacitracin ointment to incisions BID until POD#3. POD#4 and after clean incision with a 50/50 mixture of hydrogen peroxide and saline, and apply Vaseline ointment to incisions BID  - Will attempt cap today. Keep on continuous pulse ox.   - hold PO  trials  - Replace DHT  - Remove wires and neck JP.     CV/Heme:  - Continue tele  - Notify MD if Systolic blood pressure > 170.   - Notify MD for sustained pulse > 110 while pain is well controlled  - ASA 325    Pulm:   - Wean O2 to keep sats >92%    FENGI:  - Replete electrolytes to keep K >4.0, Mg>2.0  - Diet NPO diet OTHER (COMMENTS)  Nutren 1.5   - Bowel regimen senna/colace (held), banana flakes     GU:  - Notify MD if UOP <30cc/h    ID:   - s/p unasyn course   - PO Vanc course for C. Diff  - Zosyn/Vanc for PNA  - E. Coli on 1/2 blood cx 10/28, will continue to follow subsequent cultures  - Consult ID  - Sputum culture negative  - Probiotics    ENDO:   - SSI as appropriate    PPx:   - DVT: LVX 30 BID  - GI: H2     MSK:   - Will follow PT/OT   - Cast to remain in place     Dispo  - Floor    Gordy Clement, MD   Otolaryngology Head and Neck Surgery  Resident Physician, PGY2  Pager: [phone]  10/07/20  Electronically signed by Gordy Clement, MD at 10/07/2020  7:30 AM EDT

## 2020-10-07 NOTE — Progress Notes (Signed)
Formatting of this note might be different from the original.  MD Barengo [phone] paged at 2358:   782. Caleb Robinson, pt complaining of left sided jaw pain starting yesterday morning. rated 5/10 and feels like its "aching and swollen." no swelling present, medicated with oxy. thanks, maryann [phone]    MD Como paged at [place]. [place]. pt ng tube came unsutured and slid out slightly. pt pushed it back in and is now taped to nose. please advise on further actions. thanks, Velta Addison [phone]   --- MD called [phone], says still okay to use and morning rounds will determine resuture.    MD Barengo [phone] paged at [phone]. [place]. fyi tele called and said pt had 21 beats of v-tach. vital signs are stable. pt asleep in bed. thanks, maryann [phone]    Problem: Adult Inpatient Plan of Care  Goal: Plan of Care Review  Outcome: Ongoing, Progressing  Goal: Optimal Comfort and Wellbeing  Outcome: Ongoing, Progressing    Problem: Infection  Goal: Infection Symptom Resolution  Outcome: Ongoing, Progressing    Problem: Communication Impairment (Artificial Airway)  Goal: Effective Communication  Outcome: Ongoing, Progressing    Electronically signed by Neta Mends, RN at 10/08/2020  5:35 AM EDT

## 2020-10-07 NOTE — Progress Notes (Signed)
Formatting of this note might be different from the original.    Problem: Adult Inpatient Plan of Care  Goal: Plan of Care Review  Outcome: Ongoing, Progressing  Goal: Patient-Specific Goal (Individualization)  Outcome: Ongoing, Progressing  Goal: Absence of Hospital-Acquired Illness or Injury  Outcome: Ongoing, Progressing  Goal: Optimal Comfort and Wellbeing  Outcome: Ongoing, Progressing  Goal: Readiness for Transition of Care  Outcome: Ongoing, Progressing  Goal: Rounds/Family Conference  Outcome: Ongoing, Progressing    Problem: Infection  Goal: Infection Symptom Resolution  Outcome: Ongoing, Progressing    Problem: Communication Impairment (Artificial Airway)  Goal: Effective Communication  Outcome: Ongoing, Progressing    Problem: Device-Related Complication Risk (Artificial Airway)  Goal: Optimal Device Function  Outcome: Ongoing, Progressing    Problem: Skin and Tissue Injury (Artificial Airway)  Goal: Absence of Device-Related Skin or Tissue Injury  Outcome: Ongoing, Progressing    Electronically signed by Darden Amber, RN at 10/07/2020 12:09 PM EDT

## 2020-10-07 NOTE — Progress Notes (Signed)
Formatting of this note is different from the original.  HOSPITALIST CONSULT PROGRESS NOTE    Patient: Caleb Robinson  Date of service: 10/07/2020  Date of admission:  09/27/2020  Service/attending: ENT-C / Nolberto Hanlon    Active Problems:  Patient Active Problem List   Diagnosis   ? Cancer of oral cavity   ? Hypovolemic shock   ? Chronic obstructive pulmonary disease   ? Acute blood loss anemia   ? Hypotension after procedure   ? On enteral nutrition   ? Hypokalemia   ? Hypomagnesemia   ? Tobacco abuse   ? C. difficile colitis   ? Primary hypertension     Subjective     Tolerating TF.  Denies SOB and CP.  Pain reasonably controlled.  Complains of lower rib pain, like a "bruise"    Objective     Vitals:  Blood pressure 140/66, pulse 70, temperature 36.4 C (97.5 F), temperature source Axillary, resp. rate 12, height 162.6 cm (5\' 4" ), weight (!) 43.1 kg (95 lb 0.3 oz), SpO2 94 %.    Physical Exam:  GENERAL: frail appearing, no acute distress.  EYES:  Extraocular movements are intact.  Sclerae nonicteric.  ENT: Postsurgical changes. DHT sutured to nare   NECK: Trach capped. Incision intact, j/p and doppler wires in place   CARDIOVASCULAR: RRR. No murmurs, rubs, or gallops.  RESPIRATORY: Normal respiratory effort. Lungs are clear bilaterally  GASTROINTESTINAL: Abdomen is soft, nontender, and nondistended with normal bowel sounds.  MUSCULOSKELETAL: wound vac to  LLE  NEURO/PSYCH:  Pleasant and cooperative. Alert and oriented.  Motor and sensory function grossly intact and symmetrical in all four extremities.    PERTINENT LABS:  Lab Results   Component Value Date    WBC 12.49 (H) 10/07/2020    HGB 9.8 (L) 10/07/2020    HCT 29.5 (L) 10/07/2020    MCV 97.7 (H) 10/07/2020    PLT 392 10/07/2020    NEUTROABS 5.89 09/12/2020     Lab Results   Component Value Date    NA 143.0 10/07/2020    K 4.5 10/07/2020    CL 109 (H) 10/07/2020    CO2CT 25 10/07/2020    BUN 31 (H) 10/07/2020    CREATININE 0.9 10/07/2020    GLUCOSE  119.0 (H) 10/07/2020    CALCIUM 8.0 (L) 10/07/2020    MG 2.2 10/07/2020    AST  10/05/2020      Comment:      No Result    ALT 63 (H) 10/05/2020    ALKPHOS 222 (H) 10/05/2020    PROT 5.0 (L) 10/05/2020     Lab Results   Component Value Date    INR 1.24 (H) 09/27/2020     Lab Results   Component Value Date    TSH 3.33 10/02/2020    HGBA1C 4.7 09/29/2020    VITAMINB12 895 09/12/2020    FOLATE 14.2 09/12/2020     No results found for: GLUCOSEU, PROTEINUA, BILIRUBINUR, UROBILINOGEN, LABPH, BLOODU, KETONESU, NITRITE, LEUKOCYTESUR, LABSPEC, COLORU  Lab Results   Component Value Date    INR 1.24 (H) 09/27/2020     No results found for: GLUF  No results found for: PHENYTOIN, PHENOBARB, CBMZ, LITHIUM  No results found for: CHOL, TRIG, HDL, LDLCALC  No results found for: SEDIMENTATIO, CRPLOWSENSIT  Recent Labs     10/05/20  0822   GLUCOSEPCXWH 180.0*     No results for input(s): FINALREPORT, PRELIMINARYR in the last 72 hours.  RADIOLOGY:  XR Chest/Upper Abdomen NG Tube Plcmnt   Final Result   IMPRESSION:     Weighted feeding catheter tip overlies the stomach.     Findings were discussed with Dr. Erenest Blank by Bess Harvest, MD on 10/06/2020 1:22   PM.     Dictated by: Bess Harvest, MD. 10/06/2020 1:24 PM     I, Ernestina Patches, MD, have reviewed the study and agree with the findings in this   report.  10/06/2020 1:32 PM     CT Chest Pulmonary Embolism W Contrast   Final Result   IMPRESSION:      No pulmonary embolism.     Right lower lobe consolidation and scattered areas of mucoid impaction, likely   aspiration pneumonia.     A few small pulmonary nodules measuring up to 8 mm. New left upper lobe nodule   measures 6 mm. The nodules are concerning for metastases.     Marked thickening of the mitral valve leaflets with evidence of prolapse as   well as calcifications. Consider echocardiography for further evaluation.     Dictated by: Dellia Nims, MD,PhD. 10/05/2020 12:38 PM     I, Antony Blackbird, MD,PhD, have reviewed the study  and agree with the findings in   this report.  10/05/2020 12:42 PM     XR Chest AP Portable   Final Result   IMPRESSION:    Mild interstitial edema and pulmonary vascular redistribution which could be   seen with increased right heart pressures.   Right basilar airspace opacification could represent  infection or aspiration.     Findings were discussed with Dr. Welton Flakes by Dara Hoyer, MD on 10/05/2020 9:48   AM.      Dictated by: Dara Hoyer, MD. 10/05/2020 9:53 AM     I, Larose Kells, MD, have reviewed the study and agree with the findings in   this report.  10/05/2020 10:21 AM     XR Abdomen AP   Final Result   IMPRESSION:     No evidence of bowel obstruction.     Findings were discussed with Dr. Erenest Blank by Johnn Hai, MD on 10/05/2020 9:50   AM.     Dictated by: Johnn Hai, MD. 10/05/2020 9:59 AM     I, Thornton Dales, MD, have reviewed the study and agree with the findings in   this report.  10/05/2020 10:38 AM     Fluoro Video Swallow W Speech   Final Result   IMPRESSION:     1. No intratracheal aspiration or laryngeal penetration with thin barium on the   one swallow, following multiple attempts.      2. Please see speech pathology report for full details.     Dictated by: Bess Harvest, MD. 10/04/2020 11:34 AM     I, Marni Griffon, MD, have reviewed the study and agree with the findings in   this report.  10/04/2020 12:23 PM     XR Chest/Upper Abdomen NG Tube Plcmnt   Final Result   IMPRESSION:     Nasoenteric feeding catheter tip overlies the stomach.     Dictated by: Jacki Cones, MD. 09/27/2020 10:30 PM     I, Thornton Dales, MD, have reviewed the study and agree with the findings in   this report.  09/28/2020 6:38 AM     XR Chest AP Portable    (Results Pending)     MEDICATIONS:   Scheduled Meds:  ? acetaminophen  650 mg Per OG/NG Tube 4  times per day   ? aspirin  325 mg Per OG/NG Tube Daily   ? banana flakes-t-galactooligos.  10.75 g Per OG/NG Tube TID   ? budesonide-formoterol  2 puff Inhalation  BID   ? celecoxib  200 mg Oral BID   ? enoxaparin  40 mg Subcutaneous Q24H   ? famotidine  20 mg Oral BID   ? gabapentin  300 mg Per OG/NG Tube 3 times per day   ? ipratropium-albuterol  3 mL Inhalation Q6H [place]   ? melatonin  5 mg Oral QHS   ? mirtazapine  15 mg Per OG/NG Tube QHS   ? nicotine  1 patch Transdermal Daily   ? piperacillin-tazobactam  4.5 g Intravenous Q8H   ? prenatal vitamin  1 tablet Per OG/NG Tube Daily   ? protein supplement  30 mL Per OG/NG Tube Daily   ? rosuvastatin  10 mg Oral Daily   ? vancomycin  125 mg Per OG/NG Tube 4 times per day     Continuous Infusions:    PRN Meds:.docusate sodium, hydrocortisone, HYDROmorphone, ipratropium-albuterol, ondansetron, oxycodone, promethazine, sennosides    ASSESSMENT & PLAN:  Caleb Robinson is a 69 y.o. 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     Perioperative Management: Recommend early ambulation, PT/OT, RT for airway clearance. Wound mgt, pain control, rehab, and DVT PPX per primary team.     C Diff Colitis: diarrhea improved, no abdominal pain   - continue vancomycin 125mg  qid per peg (end 11/3 which is 10 days as start date 10/24; clarify with ID.  Then q3 days if other IV antibiotics continued)  -WBC 17-->12.49 today.  Peak 27 on 10/23.    Lower rib pain  -Monitor.  Increase mobility and IS    HTN: BP borderline low.    - Amlodipine 5mg  discontinued 10/29    COPD/Tobacco Use: no respiratory complains. Pt smoked cigarettes up to surgery date.  - continue symbicort BID and duonebs as needed  - continue nicotine patch 21mg   - visited with smoking cessation counselor, pt is encouraged to quit    HLD:  - continue rosuvastatin daily     Mood Disorder/Insomnia:  - continue mirtazapine qHS    Severe Malnutrition: meets criteria with severe weight loss, BMI 16  - continue RD to optimize nutrition     Ppx:  Per primary team    Please call with questions or concerns.    Quintella Reichert, MD  10/07/2020  7:29 AM      Electronically signed by Meredith Mody, MD at 10/07/2020 12:59 PM EDT

## 2020-10-08 NOTE — Progress Notes (Signed)
Formatting of this note is different from the original.  HOSPITALIST CONSULT PROGRESS NOTE    Patient: Caleb Robinson  Date of service: 10/08/2020  Date of admission:  09/27/2020  Service/attending: ENT-C / Nolberto Hanlon    Active Problems:  Patient Active Problem List   Diagnosis   ? Cancer of oral cavity   ? Hypovolemic shock   ? Chronic obstructive pulmonary disease   ? Acute blood loss anemia   ? Hypotension after procedure   ? On enteral nutrition   ? Hypokalemia   ? Hypomagnesemia   ? Tobacco abuse   ? C. difficile colitis   ? Primary hypertension   ? E coli bacteremia     Subjective     Tolerating TF.  Denies SOB and CP.  Pain reasonably controlled.  Lower rib pain ("bruise") overall stable to improved.     Objective     Vitals:  Blood pressure 143/51, pulse 69, temperature 36.5 C (97.7 F), temperature source Axillary, resp. rate 18, height 162.6 cm (5\' 4" ), weight (!) 43 kg (94 lb 12.8 oz), SpO2 97 %.    Physical Exam:  GENERAL: frail appearing, no acute distress.  EYES:  Extraocular movements are intact.  Sclerae nonicteric.  ENT: Postsurgical changes. DHT sutured to nare   NECK: Trach capped. Incision intact, j/p and doppler wires in place   CARDIOVASCULAR: RRR. No murmurs, rubs, or gallops.  RESPIRATORY: Normal respiratory effort. Lungs are clear bilaterally  GASTROINTESTINAL: Abdomen is soft, nontender, and nondistended with normal bowel sounds.  MUSCULOSKELETAL: wound vac to  LLE  NEURO/PSYCH:  Pleasant and cooperative. Alert and oriented.  Motor and sensory function grossly intact and symmetrical in all four extremities.    PERTINENT LABS:  Lab Results   Component Value Date    WBC 13.38 (H) 10/08/2020    HGB 8.7 (L) 10/08/2020    HCT 26.2 (L) 10/08/2020    MCV 94.6 (H) 10/08/2020    PLT 433 10/08/2020    NEUTROABS 5.89 09/12/2020     Lab Results   Component Value Date    NA 144.0 10/08/2020    K 4.7 10/08/2020    CL 108 10/08/2020    CO2CT 27 10/08/2020    BUN 29 (H) 10/08/2020    CREATININE 0.8  10/08/2020    GLUCOSE 101.0 (H) 10/08/2020    CALCIUM 8.2 (L) 10/08/2020    MG 2.0 10/08/2020    AST  10/05/2020      Comment:      No Result    ALT 63 (H) 10/05/2020    ALKPHOS 222 (H) 10/05/2020    PROT 5.0 (L) 10/05/2020     Lab Results   Component Value Date    INR 1.24 (H) 09/27/2020     Lab Results   Component Value Date    TSH 3.33 10/02/2020    HGBA1C 4.7 09/29/2020    VITAMINB12 895 09/12/2020    FOLATE 14.2 09/12/2020     No results found for: GLUCOSEU, PROTEINUA, BILIRUBINUR, UROBILINOGEN, LABPH, BLOODU, KETONESU, NITRITE, LEUKOCYTESUR, LABSPEC, COLORU  Lab Results   Component Value Date    INR 1.24 (H) 09/27/2020     No results found for: GLUF  No results found for: PHENYTOIN, PHENOBARB, CBMZ, LITHIUM  No results found for: CHOL, TRIG, HDL, LDLCALC  No results found for: SEDIMENTATIO, CRPLOWSENSIT  No results for input(s): [place] in the last 72 hours.  No results for input(s): FINALREPORT, PRELIMINARYR in the last 72 hours.  RADIOLOGY:  XR Chest/Upper Abdomen NG Tube Plcmnt   Final Result   IMPRESSION:     Weighted feeding catheter tip overlies the stomach.     Findings were discussed with Dr. Erenest Blank by Bess Harvest, MD on 10/06/2020 1:22   PM.     Dictated by: Bess Harvest, MD. 10/06/2020 1:24 PM     I, Ernestina Patches, MD, have reviewed the study and agree with the findings in this   report.  10/06/2020 1:32 PM     CT Chest Pulmonary Embolism W Contrast   Final Result   IMPRESSION:      No pulmonary embolism.     Right lower lobe consolidation and scattered areas of mucoid impaction, likely   aspiration pneumonia.     A few small pulmonary nodules measuring up to 8 mm. New left upper lobe nodule   measures 6 mm. The nodules are concerning for metastases.     Marked thickening of the mitral valve leaflets with evidence of prolapse as   well as calcifications. Consider echocardiography for further evaluation.     Dictated by: Dellia Nims, MD,PhD. 10/05/2020 12:38 PM     I, Antony Blackbird, MD,PhD,  have reviewed the study and agree with the findings in   this report.  10/05/2020 12:42 PM     XR Chest AP Portable   Final Result   IMPRESSION:    Mild interstitial edema and pulmonary vascular redistribution which could be   seen with increased right heart pressures.   Right basilar airspace opacification could represent  infection or aspiration.     Findings were discussed with Dr. Welton Flakes by Dara Hoyer, MD on 10/05/2020 9:48   AM.      Dictated by: Dara Hoyer, MD. 10/05/2020 9:53 AM     I, Larose Kells, MD, have reviewed the study and agree with the findings in   this report.  10/05/2020 10:21 AM     XR Abdomen AP   Final Result   IMPRESSION:     No evidence of bowel obstruction.     Findings were discussed with Dr. Erenest Blank by Johnn Hai, MD on 10/05/2020 9:50   AM.     Dictated by: Johnn Hai, MD. 10/05/2020 9:59 AM     I, Thornton Dales, MD, have reviewed the study and agree with the findings in   this report.  10/05/2020 10:38 AM     Fluoro Video Swallow W Speech   Final Result   IMPRESSION:     1. No intratracheal aspiration or laryngeal penetration with thin barium on the   one swallow, following multiple attempts.      2. Please see speech pathology report for full details.     Dictated by: Bess Harvest, MD. 10/04/2020 11:34 AM     I, Marni Griffon, MD, have reviewed the study and agree with the findings in   this report.  10/04/2020 12:23 PM     XR Chest/Upper Abdomen NG Tube Plcmnt   Final Result   IMPRESSION:     Nasoenteric feeding catheter tip overlies the stomach.     Dictated by: Jacki Cones, MD. 09/27/2020 10:30 PM     I, Thornton Dales, MD, have reviewed the study and agree with the findings in   this report.  09/28/2020 6:38 AM     XR Chest AP Portable    (Results Pending)     MEDICATIONS:   Scheduled Meds:  ? acetaminophen  650 mg Per OG/NG Tube 4  times per day   ? aspirin  325 mg Per OG/NG Tube Daily   ? banana flakes-t-galactooligos.  10.75 g Per OG/NG Tube TID   ?  budesonide-formoterol  2 puff Inhalation BID   ? celecoxib  200 mg Oral BID   ? enoxaparin  40 mg Subcutaneous Q24H   ? famotidine  20 mg Oral BID   ? gabapentin  300 mg Per OG/NG Tube 3 times per day   ? ipratropium-albuterol  3 mL Inhalation Q6H [place]   ? melatonin  5 mg Oral QHS   ? mirtazapine  15 mg Per OG/NG Tube QHS   ? nicotine  1 patch Transdermal Daily   ? piperacillin-tazobactam  4.5 g Intravenous Q8H   ? prenatal vitamin  1 tablet Per OG/NG Tube Daily   ? protein supplement  30 mL Per OG/NG Tube Daily   ? rosuvastatin  10 mg Oral Daily   ? vancomycin  125 mg Per OG/NG Tube 4 times per day     Continuous Infusions:    PRN Meds:.docusate sodium, hydrocortisone, HYDROmorphone, ipratropium-albuterol, ondansetron, oxycodone, promethazine, sennosides    ASSESSMENT & PLAN:  Mr. Bertram is a 69 y.o. 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     Perioperative Management: Recommend early ambulation, PT/OT, RT for airway clearance. Wound mgt, pain control, rehab, and DVT PPX per primary team.     C Diff Colitis: diarrhea improved overall, no abdominal pain   - Continue vancomycin 125mg  qid per peg (end 11/3 which is 10 days from start date of 10/24; clarify with ID as 10/30 note says end date is 11/15.  Will then need vanc 125mg  once q3 days while on IV antibiotics)  -WBC 17-->12.49-->13.3 today.  Peak 27 on 10/23.    Lower rib pain  -Monitor.  Increase mobility and IS    HTN:   - BP was borderline low.    - Amlodipine 5mg  discontinued 10/29.  Monitor.      COPD/Tobacco Use: no respiratory complains. Pt smoked cigarettes up to surgery date.  - continue symbicort BID and duonebs as needed  - continue nicotine patch 21mg   - visited with smoking cessation counselor, pt is encouraged to quit    HLD:  - continue rosuvastatin daily     Mood Disorder/Insomnia:  -  continue mirtazapine qHS    Severe Malnutrition: meets criteria with severe weight loss, BMI 16  - continue RD to optimize nutrition     Ppx: Per primary team    Please call with questions or concerns.    Quintella Reichert, MD  10/08/2020  8:44 AM      Electronically signed by Meredith Mody, MD at 10/08/2020  8:48 AM EDT

## 2020-10-08 NOTE — Progress Notes (Signed)
Formatting of this note might be different from the original.    Problem: Adult Inpatient Plan of Care  Goal: Plan of Care Review  Outcome: Ongoing, Progressing  Goal: Patient-Specific Goal (Individualization)  Outcome: Ongoing, Progressing  Goal: Absence of Hospital-Acquired Illness or Injury  Outcome: Ongoing, Progressing  Goal: Optimal Comfort and Wellbeing  Outcome: Ongoing, Progressing  Goal: Readiness for Transition of Care  Outcome: Ongoing, Progressing  Goal: Rounds/Family Conference  Outcome: Ongoing, Progressing    Problem: Infection  Goal: Infection Symptom Resolution  Outcome: Ongoing, Progressing    Problem: Communication Impairment (Artificial Airway)  Goal: Effective Communication  Outcome: Ongoing, Progressing    Problem: Device-Related Complication Risk (Artificial Airway)  Goal: Optimal Device Function  Outcome: Ongoing, Progressing    Problem: Skin and Tissue Injury (Artificial Airway)  Goal: Absence of Device-Related Skin or Tissue Injury  Outcome: Ongoing, Progressing    Electronically signed by Darden Amber, RN at 10/08/2020 10:42 AM EDT

## 2020-10-08 NOTE — Progress Notes (Signed)
Formatting of this note might be different from the original.  For all Team C patient questions from 6am-5pm during the week please contact the Team C intern at [phone] or their pager. On weekdays between 5pm-6am and weekends please first attempt to contact the intern on call at [phone] or, secondarily, the junior ENT resident on call.    Otolaryngology--Head and Neck Surgery Progress Note    Patient Name: Caleb Robinson  Date: 10/08/2020  Admission Date: 09/27/2020  Service: Otolaryngology--Head and Neck Surgery    Subjective:   AFVSS. On RA. WBC 13.4 (12.5). Cultures stable, with blood growing E coli in 1 specimen and tracheal aspirate growing pasteurella. Trach capped without issue. One trach suture out, placed trach tie on. Jp3 removed. Overnight, complained of left jaw pain, no swelling present, received oxy. NGT came un-sutured and out slightly, pushed back in and now taped to nose.     Objective:  General: NAD  HEENT: supple neck with midline trachea, surgical incision sites c/d/i, JP in place to bulb suction with ss output. Intraoral flap with some epidermolysis but good turgor, good cap refill and no evidence of congestion. 6-0 CFS capped, some expected postoperative edema.   CV: pulses intact, regular rate  Resp: no increased work of breathing on TC  Ext: no cyanosis or swelling or rashes. Donor site under CAM boot, wound vac in place.    Neuro: EOMI    Assessment:   69 y.o. male with PMH of anterior OC SCCa, active smoker with COPD who is 11 Days Post-Op s/p 69yo M w/  now s/p Comp OC w/ segmental mandibulectmy, partial gloss, b/l ND, trach, L fibula free flap, STSG. Vessels: L Facial A, retromandibular V.     Neuro:   - Multimodal pain control    HEENT:   -RN flap checks q4h  - BID MD flap checks   - Vaseline ointment to incisions BID  - continuous pulse ox.   - hold PO trials    CV/Heme:  - Continue tele  - Notify MD if Systolic blood pressure > 170.   - Notify MD for sustained pulse > 110 while pain is  well controlled  - ASA 325    Pulm:   - Wean O2 to keep sats >92%    FENGI:  - Replete electrolytes to keep K >4.0, Mg>2.0  - Diet NPO diet OTHER (COMMENTS)  Nutren 1.5   - banana flakes     GU:  - Notify MD if UOP <30cc/h    ID:   - s/p unasyn course   - PO Vanc course for C. Diff  - Zosyn/Vanc for PNA  - blood growing E coli in 1 specimen and tracheal aspirate growing pasteurella.  - Probiotics    ENDO:   - SSI as appropriate    PPx:   - DVT: LVX 30 BID  - GI: H2     MSK:   - Will follow PT/OT   - Cast to remain in place     Dispo  - [place], [place] Department of Otolaryngology-Head and Neck Surgery  Pager #: [phone]  10/08/20  7:11 AM      Electronically signed by Janece Canterbury, MD at 10/08/2020  7:18 AM EDT

## 2020-10-09 NOTE — Progress Notes (Signed)
Formatting of this note is different from the original.  [place] Charting Report  Swallowing Therapy    Patient: Caleb Robinson  Date of Service: 10/09/2020  MRN: [id number]  Contact Serial Number: [id number]   DOB: [birth year]  Age: 69 y.o.  Date Therapy Plan Established (30): 10/04/20  Date Patient First Became Aware of Symptoms (11): 09/27/20  Date Service Initiated by Billing Provider (76): 10/04/20    Visit Charges:  CPT 33825:  Treatment of swallowing dysfunction and/or oral function for feeding (Standard)    History of Present Illness: Caleb Robinson is a 69 y.o. male with a history of anterior OC SCCa, active smoker with COPD who is 9 Days Post-Ops/p 69yo M w/ now s/p Comp OC w/ segmental mandibulectmy, partial gloss, b/l ND, trach, L fibula free flap, STSG on 09/27/20. He was referred for an MBSS to evaluate safety to initiate PO.   The patient was alone in the session.    Date instrumental exam completed: 10/04/20  Results: Patient unable to accept bolus with anterior loss    Recent Imaging:  CT Chest 10/05/20  IMPRESSION:     No pulmonary embolism.    Right lower lobe consolidation and scattered areas of mucoid impaction, likely  aspiration pneumonia.    A few small pulmonary nodules measuring up to 8 mm. New left upper lobe nodule  measures 6 mm. The nodules are concerning for metastases.    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 Consider echocardiography for further evaluation.    Dictated by: Dellia Nims, MD,PhD. 10/05/2020 12:38 PM      SUBJECTIVE:   Pt was seen for dysphagia treatment while positioned seated upright, in bed with nursing present.     OBJECTIVE:   Current diet: NA - Patient NPO  Current Communication: Verbal at the conversation level  Vocal quality: strong when patient remembered to cover stoma following recent decanulation  Oral Care: limited by oral surgery  PO  trials:     Patient practiced water trials using a squeeze bottle with catheter extension to facilitate bolus placement and oral acceptance.  Patient required max cuing not to hyperextend neck in attempt to promote AP transit when seated upright.  Patient positioned in slight recline and was able to accept 3 trials of 3-28ml boluses.  Multiple swallows noted although no overt s/s of aspiration noted in slight recline patient remained fearful of choking.  Patient refused additional trials.    Safety Guidelines:    Patient to sit slightly reclined, place catheter on left posterior tongue and present teaspoon volumes at a slow rate.      Education:  People educated: patient and nursing caregiver   Information provided: swallow guidelines  Level of understanding: inadequate  Further education: required  Swallow Guidelines: in chart    ASSESSMENT:  Pt presents with severe oral dysphagia with poor bolus control and acceptance.      PLAN/RECOMMENDATIONS:  Patient to practice small water trials using squeeze bottle .  Physician requesting repeat MBS.  Will check on patient in AM prior to scheduling MBS to ensure patient able/willing to accept boluses.  If patient continues to refuse trials or performance impaired on MBS recommend consideration of PEG    Short Term Goals:    The patient will be able to recall safety guidelines with no more than minimal cuing.   SLP goals: In progress  The patient will successfully implement a  slight recline and suck-swallow during 80% of swallowing trials with no more than moderate cuing to improve swallow safety.   SLP goals: In progress     Lorie Phenix, MA.CCC-SLP, BCS-S  Speech-Language Pathologist  Board Certified Specialist in Swallowing and Swallowing Disorders  Scheduling: [phone]  Office:  [phone]  Fax: [phone]  Pager [phone]    Long Term Goal:  Pt will tolerate the safest, least restrictive diet in order to meet nutritional needs.     Electronically signed by Lorie Phenix, [place] CCC-SLP at 10/09/2020  9:06 PM EDT

## 2020-10-09 NOTE — Progress Notes (Signed)
Formatting of this note might be different from the original.    Problem: Adult Inpatient Plan of Care  Goal: Plan of Care Review  Outcome: Ongoing, Progressing  Goal: Patient-Specific Goal (Individualization)  Outcome: Ongoing, Progressing      Paged MD Fabie ([phone]) at 131am    Standard: [place] (782): pt pulled out his DHT. Please advise. Thanks!  Hilda Blades [phone]  Electronically signed by Ihor Austin, RN at 10/10/2020  1:31 AM EDT

## 2020-10-09 NOTE — Progress Notes (Signed)
Formatting of this note might be different from the original.  Antibiotic Discharge Note:    October 09, 2020    Diagnosis:   1. E. Coli bacteremia likely secondary to urinary retention or colonic translocation  2. C. Diff infection, first episode    Antimicrobial:   1) IV rocephin 2 g q 24 hours   -Treat for 2 weeks total   -End date of 10/20/2020 included  2) PO vancomycin 125 mg qid   -Treat for 10 days total   -End date of 10/13/2020 included  3) Prophylactic PO vancomycin 125 mg every 3 days   -Start after completing PO vanc in #2 above   -Can stop after completing IV rocephin 11/12    Please order weekly labs on discharge: CBC, CMP    Consider midline placement if needed on DC.    PICC/Midline care: Ok for Home Health to remove after antibiotics complete    ID follow up appointment: ID follow up not needed. Follow up with PCP and primary teams.     Fax results to Antonietta Breach at [phone].    Antonietta Breach, DO  Infectious Diseases Fellow   Pager ID: [phone]  Date: 10/09/2020  Time: 12:33 PM      Electronically signed by Patrice Paradise, MD at 10/09/2020  5:36 PM EDT

## 2020-10-09 NOTE — Progress Notes (Signed)
Formatting of this note is different from the original.  Physical Therapy Inpatient Progress note  [facility]    Patient Name: Caleb Robinson    Age: 69 y.o.    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]  Treatment Diagnosis: Generalized deconditioning and acute post-op pain    Total Treatment Time:  23  Timed Codes:  23    Subjective     Subjective: Agreeable to PT; decannulated today   Patient's Goals: to return home     Pain:  Pre-treatment: other (see comments)  Post-treatment: other (see comments)  Comment: none stated    Precautions: Drain, Fall risk, Incision / suture site, IV, NPO and NGT  Left LE:  WBAT    Barriers to Learning: No known limitations    Objective     Cognitive Status  Orientation: Oriented x4  Level of Consciousness: Cooperative  Follows Commands: 100% of the time  Safety and Judgment: Intact    Treatment Performed:    Bed Mobility     -Supine to sit: Mod I  -Sit to supine: Mod I    Transfers  -Sit to stand: S, verbal cues for hand placement  -Bed to chair: NT    Balance: Good standing balance  Gait: With R cowboy boot donned and CAM boot donned on L, amb x170' S using FWW. Slight decrease in gait speed   Stairs: NT    Therex: L LE fib flap HEB, ND HEP   Education: PT POC, d/c planning    Pt left in supine c all needs met, call bell in reach, RN aware, bed alarm intact and audible.    [facility] Inpatient Basic Mobility Short Form  How much difficulty does the patient currently have...  1. Turning over in bed (including adjusting bedclothes, sheets  and blankets)? A Little  2. Sitting down on and standing up from a chair with arms (e.g., wheelchair, bedside commode, etc.) A Little  3. Moving from lying on back to sitting on the side of the bed? A Little    How much help from another person does the patient currently need...  4. Moving to and from a bed to a chair (including a wheelchair)? A Little  5. Need to walk in hospital room? A Little  6. Climbing 3-5 steps with a railing? A Little    Raw Score: 18  T-score: 43.63      Assessment     Assessment: Much improved today, ambulating without LOB or visible fatigue during session. Good range to neutral during ankle therex. Will continue to follow.     Recommendations:  Discharge Disposition: home with assist, home with home health  Equipment Needed: front wheeled walker    Goals     Short Term Goals:  Pt will be I with all levels of bed mobility by 10/31 MET  Pt will go from supine in bed to OOB with STS I by 10/22 MET  Pt will ambulate 100' with SPV by  10/31 MET upgrade to 200' Mod I    Long Term Goal  Pt will ambulate 500' independently by 11/10    Plan     Frequency: 2-4 times per week     Interventions: Gait training therapy - 02585, Therapeutic activities - 97530, Therapeutic exercises - 27782    Therapist: Lorelee Market, PT, DPT  Pager ID: [phone]      Electronically signed by Lorelee Market, PT, DPT at 10/09/2020  3:44 PM EDT

## 2020-10-09 NOTE — Procedures (Signed)
Associated Order(s): INSERT PICC LINE  Formatting of this note is different from the original.         Therapeutic Services                     VAIN Team Midline Catheter Placement Note        (Vascular Access Insertion by [facility] [place]    Patient Name:     Caleb Robinson  Age:                      69 y.o.  MRN:                    [id number]  Admission Date:  09/27/2020  Room #:                D782/A    Allergies:   Cat Dander    Diagnosis      1. Hypovolemic shock    2. Dysphonia    3. Cancer of oral cavity    4. Oral phase dysphagia      Midline Placement Note:     Upper extremity prepped and draped in sterile manner. Vasculature visualized and accessed under ultrasound guidance, site locally infiltrated with 1% lidocaine. Midline inserted. Catheter wings secured with sterile securement device. Insertion site cleaned and dressed per hospital central line standard. Flushed with 20 mL 0.9% NaCl. No difficulties or complications encountered.    Tip confirmation is not required. This is NOT a central line. Tip is located at or below level of axillary vein.  Good to use.  Report to floor nurse Primary RN.    Comments: Midline placed    Patient Lines/Drains/Airways Status     Active Midline     Name Placement date Placement time Site Days Additional Info    Midline Catheter - Single Lumen (Adult, Obstetrics) 10/09/20 1600 5 Fr left; brachial non-antibiotic impregnated; non-tunelled; pressure injectable catheter; uncuffed 10/09/20  1600  left; brachial  less than 1 Time out performed prior to procedure: yes          The following steps were followed (select all that apply): Patient/family education regarding CLABSI prevention; Hand hygiene prior to donning gloves; Maximum barrier precautions used: hat, mask, sterile gloves and gown, full sterile body drape; Cleansed skin with Chlorhexidine/alcohol solution          Was this a clinical emergency?: no          Observer's  Name (typically the RN, but may include other licensed providers): Mickel Baas, PCT          Size: 5 Fr          Device: non-antibiotic impregnated; non-tunelled; pressure injectable catheter; uncuffed          Manufacturer/Lot Number/Serial Number: [id number]          Indication: intravenous therapy          Inserted By: VAIN Team          Placement Method: ultrasound visualization with modified seldinger technique at bedside          Tip Confirmation/Termination: at or below level of axillary vein          Total Catheter Length (cm): 13 cm          External Catheter Length (cm): 0  Pain Prevention: intradermal injection          Patient Tolerance: appears comfortable; age-appropriate response; tolerated well          Insertion Department: 7W          At the completion of the procedure (select all that apply): blood return in all lumens; CHG patch applied; sterile dressing applied; wire removed            Aurelio Brash, RN  Pager ID: [phone]  10/09/2020  4:23 PM    Midline Catheter - Single Lumen (Adult, Obstetrics) 10/09/20 1600 5 Fr left; brachial non-antibiotic impregnated; non-tunelled; pressure injectable catheter; uncuffed (Active)   10/09/20 1600 left; brachial   Present on arrival:    Time out performed prior to procedure: yes   The following steps were followed (select all that apply): Patient/family education regarding CLABSI prevention; Hand hygiene prior to donning gloves; Maximum barrier precautions used: hat, mask, sterile gloves and gown, full sterile body drape; Cleansed skin with Chlorhexidine/alcohol solution   Was this a clinical emergency?: no   Observer's Name (typically the RN, but may include other licensed providers): Mickel Baas, PCT   Size: 5 Fr   Lumen 1 :    Lumen 2 :    Lumen 3 :    Device: non-antibiotic impregnated; non-tunelled; pressure injectable catheter; uncuffed   Manufacturer/Lot Number/Serial Number: [id number]   Indication: intravenous therapy   Inserted By: VAIN Team    Placement Method: ultrasound visualization with modified seldinger technique at bedside   Tip Confirmation/Termination: at or below level of axillary vein   Total Catheter Length (cm): 13 cm   External Catheter Length (cm): 0   Unsuccessful Insertion Attempts:    Unsuccessful Insertion Attempt Location:    Pain Prevention: intradermal injection   Patient Tolerance: appears comfortable; age-appropriate response; tolerated well   Insertion Department: 7W   At the completion of the procedure (select all that apply): blood return in all lumens; CHG patch applied; sterile dressing applied; wire removed   Removal Indication:    Removal Assessment/Interventions:    Additional Comments:    Dressing:    Securement:    IV/ Device WDL WDL 10/09/20 1600   Additional Site Signs no redness; no warmth; no pain; no swelling; no palpable cord; no streak formation; no drainage 10/09/20 1600   Phlebitis 0-->no symptoms 10/09/20 1600   Infiltration 0-->no symptoms 10/09/20 1600   Dressing bio patch present; dressing dry and intact 10/09/20 1600   Date of Last Dressing Change 10/09/20 10/09/20 1600   Lumen 1 Patency/Care flushed without difficulty; blood return present 10/09/20 1600   Daily Review of Necessity completed 10/09/20 1600       Electronically signed by Aurelio Brash, RN at 10/09/2020  4:23 PM EDT

## 2020-10-09 NOTE — Progress Notes (Signed)
Formatting of this note might be different from the original.  For all Team C patient questions from 6am-5pm during the week please contact the Team C intern at [phone] or their pager. On weekdays between 5pm-6am and weekends please first attempt to contact the intern on call at [phone] or, secondarily, the junior ENT resident on call.    Otolaryngology--Head and Neck Surgery Progress Note    Patient Name: Caleb Robinson  Date: 10/09/2020  Admission Date: 09/27/2020  Service: Otolaryngology--Head and Neck Surgery    Subjective:   AFVSS. On RA. Resutured DHT after suture came out.    Objective:  General: NAD  HEENT: supple neck with midline trachea, surgical incision sites c/d/i, JP in place to bulb suction with ss output. Intraoral flap with some epidermolysis but good turgor, good cap refill and no evidence of congestion. 6-0 CFS capped, some expected postoperative edema.   CV: pulses intact, regular rate  Resp: no increased work of breathing on TC  Ext: no cyanosis or swelling or rashes. Donor site under CAM boot, wound vac in place.    Neuro: EOMI    Assessment:   69 y.o. male with PMH of anterior OC SCCa, active smoker with COPD who is 12 Days Post-Op s/p 69yo M w/  now s/p Comp OC w/ segmental mandibulectmy, partial gloss, b/l ND, trach, L fibula free flap, STSG. Vessels: L Facial A, retromandibular V.     Neuro:   - Multimodal pain control    HEENT:   -RN flap checks q4h  - BID MD flap checks   - Vaseline ointment to incisions BID  - continuous pulse ox.   - Will discuss repeat MBSS after decannulation  - Decannulation today  - Start oral care    CV/Heme:  - Continue tele  - Notify MD if Systolic blood pressure > 170.   - Notify MD for sustained pulse > 110 while pain is well controlled  - ASA 325    Pulm:   - Wean O2 to keep sats >92%    FENGI:  - Replete electrolytes to keep K >4.0, Mg>2.0  - Diet NPO diet OTHER (COMMENTS)  Nutren 1.5   - Bolus tube feeds    GU:  - Notify MD if UOP <30cc/h    ID:   - s/p  unasyn course   - PO Vanc course for C. Diff through 11/15.  Will require PO vanc ppx for C. Diff every 3 days starting on 11/16 while on IV abx  - Zosyn for blood cx until 11/11 (can switch to Ceftriaxone pending sensitivities)  - blood growing E coli in 1 specimen and tracheal aspirate growing pasteurella.  - Probiotics    ENDO:   - SSI as appropriate    PPx:   - DVT: LVX 30 BID  - GI: H2     MSK:   - Will follow PT/OT   - Cast to remain in place     Dispo  - [place]  Resident Physician - PGY3  Otolaryngology - Head & Neck Surgery  Pager [phone]    Electronically signed by Nunzio Cobbs, MD at 10/09/2020  6:30 AM EDT

## 2020-10-09 NOTE — Progress Notes (Signed)
Formatting of this note might be different from the original.    Problem: Adult Inpatient Plan of Care  Goal: Plan of Care Review  Outcome: Ongoing, Progressing  Goal: Patient-Specific Goal (Individualization)  Outcome: Ongoing, Progressing  Goal: Absence of Hospital-Acquired Illness or Injury  Outcome: Ongoing, Progressing  Goal: Optimal Comfort and Wellbeing  Outcome: Ongoing, Progressing  Goal: Readiness for Transition of Care  Outcome: Ongoing, Progressing  Goal: Rounds/Family Conference  Outcome: Ongoing, Progressing    Problem: Infection  Goal: Infection Symptom Resolution  Outcome: Ongoing, Progressing    Problem: Communication Impairment (Artificial Airway)  Goal: Effective Communication  Outcome: Ongoing, Progressing    Problem: Device-Related Complication Risk (Artificial Airway)  Goal: Optimal Device Function  Outcome: Ongoing, Progressing    Problem: Skin and Tissue Injury (Artificial Airway)  Goal: Absence of Device-Related Skin or Tissue Injury  Outcome: Ongoing, Progressing    Problem: Adult Inpatient Plan of Care  Goal: Plan of Care Review  Outcome: Ongoing, Progressing  Goal: Patient-Specific Goal (Individualization)  Outcome: Ongoing, Progressing  Goal: Absence of Hospital-Acquired Illness or Injury  Outcome: Ongoing, Progressing  Goal: Optimal Comfort and Wellbeing  Outcome: Ongoing, Progressing  Goal: Readiness for Transition of Care  Outcome: Ongoing, Progressing  Goal: Rounds/Family Conference  Outcome: Ongoing, Progressing    Problem: Infection  Goal: Infection Symptom Resolution  Outcome: Ongoing, Progressing    Problem: Communication Impairment (Artificial Airway)  Goal: Effective Communication  Outcome: Ongoing, Progressing    Problem: Device-Related Complication Risk (Artificial Airway)  Goal: Optimal Device Function  Outcome: Ongoing, Progressing    Problem: Skin and Tissue Injury (Artificial Airway)  Goal: Absence of Device-Related Skin or Tissue Injury  Outcome: Ongoing,  Progressing    Electronically signed by Ronney Lion, RN at 10/09/2020  3:14 AM EDT

## 2020-10-10 NOTE — Progress Notes (Signed)
Formatting of this note is different from the original.  [facility] Charting Report  Swallowing Therapy    Patient: Caleb Robinson  Date of Service: 10/10/2020  MRN: [id number]  Contact Serial Number: [id number]   DOB: [birth year]  Age: 69 y.o.  Date Therapy Plan Established (30): 10/04/20  Date Patient First Became Aware of Symptoms (11): 09/27/20  Date Service Initiated by Billing Provider (57): 10/04/20    Visit Charges:  CPT 95638:  Treatment of swallowing dysfunction and/or oral function for feeding (Standard)    History of Present Illness: Salah Nakamura is a 69 y.o. male with a history of anterior OC SCCa, active smoker with COPD who is 9 Days Post-Ops/p 69yo M w/ now s/p Comp OC w/ segmental mandibulectmy, partial gloss, b/l ND, trach, L fibula free flap, STSG on 09/27/20. He is s/p DHT self-removal overnight, not yet replaced. He was referred for an MBSS to evaluate safety to initiate PO. Patient has only accepted very small sips in previous sessions, limited bolus clearance secondary to flap bulk. The patient was alone in the session.    Date instrumental exam completed: 10/04/20  Results: Patient unable to accept bolus with anterior loss    Recent Imaging:  CT Chest 10/05/20  IMPRESSION:     No pulmonary embolism.    Right lower lobe consolidation and scattered areas of mucoid impaction, likely  aspiration pneumonia.    A few small pulmonary nodules measuring up to 8 mm. New left upper lobe nodule  measures 6 mm. The nodules are concerning for metastases.    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 Consider echocardiography for further evaluation.    Dictated by: Dellia Nims, MD,PhD. 10/05/2020 12:38 PM      SUBJECTIVE:   Pt was seen for dysphagia treatment while positioned seated upright, in bed unaccompanied.     OBJECTIVE:   Current diet: NA - Patient NPO, pulled out  his DHT, not yet replaced  Current Communication: Verbal at the conversation level  Vocal quality: strong when patient remembered to cover stoma following recent decanulation  Oral Care: limited by oral surgery  PO trials:     Patient practiced water trials using a 30 and 50 mL syringe with with cut catheter extension to facilitate bolus placement and oral acceptance.  Patient required max encouragement to participate in PO trials this morning, as he report drowsiness and some fear of swallowing. Patient positioned in slight recline and was able to accept ~5 trials of small 3-25ml boluses of water colored with blue dye only. He declined to practice with the squeeze bottle this morning opting for the syringe. He prefers to self present boluses. Multiple swallows noted although no overt s/s of aspiration noted in slight recline. Patient remained fearful of choking. No return of blue seen via healing trach site (still open per inspection). Mod anterior FOM oral residue seen along recon; pt unable to achieve oral clearance with cued slurp swallow strategy, and residue had to be suctioned. Patient refused additional trials but is agreeable to repeat MBS later today or tomorrow.    Safety Guidelines:    Patient to sit slightly reclined, place catheter on left posterior tongue and present teaspoon volumes at a slow rate.      Education:  People educated: patient and nursing caregiver   Information provided: swallow guidelines  Level of understanding: inadequate  Further education: required  Swallow  Guidelines: in chart    ASSESSMENT:  Pt presents with severe oral dysphagia with poor bolus control and acceptance.      PLAN/RECOMMENDATIONS:  Patient to practice small water trials using squeeze bottle or syringe with catheter tip extension .  Physician requesting repeat MBS. Pt agreeable today; however, per radiology schedule, MBS to be completed tomorrow.    If patient continues to refuse trials or performance impaired on  MBS recommend consideration of PEG placement.    Long Term Goal:  Pt will tolerate the safest, least restrictive diet in order to meet nutritional needs.     Short Term Goals:    The patient will be able to recall safety guidelines with no more than minimal cuing.   SLP goals: In progress  The patient will successfully implement a slight recline and suck-swallow during 80% of swallowing trials with no more than moderate cuing to improve swallow safety.   SLP goals: In progress     Jacklynn Lewis, M.S., [place]  Speech-Language Pathologist  Scheduling: [phone]  Fax: [phone]  Pager [phone]      Electronically signed by Jacklynn Lewis, [place] at 10/10/2020  4:30 PM EDT

## 2020-10-10 NOTE — Progress Notes (Signed)
Formatting of this note might be different from the original.    Problem: Adult Inpatient Plan of Care  Goal: Plan of Care Review  Outcome: Ongoing, Progressing  Goal: Patient-Specific Goal (Individualization)  Outcome: Ongoing, Progressing  Goal: Absence of Hospital-Acquired Illness or Injury  Outcome: Ongoing, Progressing  Goal: Optimal Comfort and Wellbeing  Outcome: Ongoing, Progressing  Goal: Readiness for Transition of Care  Outcome: Ongoing, Progressing  Goal: Rounds/Family Conference  Outcome: Ongoing, Progressing    Problem: Infection  Goal: Infection Symptom Resolution  Outcome: Ongoing, Progressing    Problem: Communication Impairment (Artificial Airway)  Goal: Effective Communication  Outcome: Ongoing, Progressing    Problem: Device-Related Complication Risk (Artificial Airway)  Goal: Optimal Device Function  Outcome: Ongoing, Progressing    Problem: Skin and Tissue Injury (Artificial Airway)  Goal: Absence of Device-Related Skin or Tissue Injury  Outcome: Ongoing, Progressing    Electronically signed by Darden Amber, RN at 10/10/2020  2:55 PM EDT

## 2020-10-10 NOTE — Progress Notes (Signed)
Formatting of this note is different from the original.  HOSPITALIST CONSULT PROGRESS NOTE    Patient: Caleb Robinson  Date of service: 10/10/2020  Date of admission:  09/27/2020  Service/attending: ENT-C / Nolberto Hanlon    Active Problems:  Patient Active Problem List   Diagnosis   ? Cancer of oral cavity   ? Hypovolemic shock   ? Chronic obstructive pulmonary disease   ? Acute blood loss anemia   ? Hypotension after procedure   ? On enteral nutrition   ? Hypokalemia   ? Hypomagnesemia   ? Tobacco abuse   ? C. difficile colitis   ? Primary hypertension   ? E coli bacteremia     Subjective     Pt sleeping, wakes to name. Voice strong and he states feeling "very tried" today, reports poor sleep overnight. No focal complaints but states "it's not my best day." Denies cp, sob, abdominal pain or n/v.    Objective     Vitals:  Blood pressure 103/78, pulse 115, temperature 36.5 C (97.7 F), temperature source Axillary, resp. rate 20, height 162.6 cm (5' 4" ), weight (!) 43 kg (94 lb 12.8 oz), SpO2 93 %.    Physical Exam:  GENERAL: frail appearing, no acute distress.  EYES:  Extraocular movements are intact.  Sclerae nonicteric.  ENT: Postsurgical changes. DHT to right nare   NECK: decannulated and soft dressing to site. Incision intact   CARDIOVASCULAR: RRR. No murmurs, rubs, or gallops.  RESPIRATORY: Normal respiratory effort. Lungs are coarse bilaterally  GASTROINTESTINAL: Abdomen is soft, nontender, and nondistended with normal bowel sounds.  MUSCULOSKELETAL: No BLE edema   NEURO/PSYCH:  Calm and cooperative. Alert and oriented x3.  Motor and sensory function grossly intact and symmetrical in all four extremities.    PERTINENT LABS:  Lab Results   Component Value Date    WBC 13.17 (H) 10/10/2020    HGB 9.9 (L) 10/10/2020    HCT 29.6 (L) 10/10/2020    MCV 95.5 (H) 10/10/2020    PLT 633 (H) 10/10/2020    NEUTROABS 5.89 09/12/2020     Lab Results   Component Value Date    NA 140.0 10/10/2020    K 4.8 10/10/2020    CL  105 10/10/2020    CO2CT 30 (H) 10/10/2020    BUN 21 10/10/2020    CREATININE 0.8 10/10/2020    GLUCOSE 81.0 10/10/2020    CALCIUM 9.0 10/10/2020    MG 2.2 10/10/2020    AST  10/05/2020      Comment:      No Result    ALT 63 (H) 10/05/2020    ALKPHOS 222 (H) 10/05/2020    PROT 5.0 (L) 10/05/2020     Lab Results   Component Value Date    INR 1.24 (H) 09/27/2020     Lab Results   Component Value Date    TSH 1.88 10/09/2020    HGBA1C 4.7 09/29/2020    VITAMINB12 895 09/12/2020    FOLATE 14.2 09/12/2020     No results found for: GLUCOSEU, PROTEINUA, BILIRUBINUR, UROBILINOGEN, LABPH, BLOODU, KETONESU, NITRITE, LEUKOCYTESUR, LABSPEC, COLORU  Lab Results   Component Value Date    INR 1.24 (H) 09/27/2020     No results found for: GLUF  No results found for: PHENYTOIN, PHENOBARB, CBMZ, LITHIUM  No results found for: CHOL, TRIG, HDL, LDLCALC  No results found for: SEDIMENTATIO, CRPLOWSENSIT  No results for input(s): [place] in the last 72 hours.  No results  for input(s): FINALREPORT, PRELIMINARYR in the last 72 hours.    RADIOLOGY:  XR Chest/Upper Abdomen NG Tube Plcmnt   Final Result   IMPRESSION:     Weighted feeding catheter tip overlies the stomach.     Findings were discussed with Dr. Erenest Blank by Bess Harvest, MD on 10/06/2020 1:22   PM.     Dictated by: Bess Harvest, MD. 10/06/2020 1:24 PM     I, Ernestina Patches, MD, have reviewed the study and agree with the findings in this   report.  10/06/2020 1:32 PM     CT Chest Pulmonary Embolism W Contrast   Final Result   IMPRESSION:      No pulmonary embolism.     Right lower lobe consolidation and scattered areas of mucoid impaction, likely   aspiration pneumonia.     A few small pulmonary nodules measuring up to 8 mm. New left upper lobe nodule   measures 6 mm. The nodules are concerning for metastases.     Marked thickening of the mitral valve leaflets with evidence of prolapse as   well as calcifications. Consider echocardiography for further evaluation.     Dictated by:  Dellia Nims, MD,PhD. 10/05/2020 12:38 PM     I, Antony Blackbird, MD,PhD, have reviewed the study and agree with the findings in   this report.  10/05/2020 12:42 PM     XR Chest AP Portable   Final Result   IMPRESSION:    Mild interstitial edema and pulmonary vascular redistribution which could be   seen with increased right heart pressures.   Right basilar airspace opacification could represent  infection or aspiration.     Findings were discussed with Dr. Welton Flakes by Dara Hoyer, MD on 10/05/2020 9:48   AM.      Dictated by: Dara Hoyer, MD. 10/05/2020 9:53 AM     I, Larose Kells, MD, have reviewed the study and agree with the findings in   this report.  10/05/2020 10:21 AM     XR Abdomen AP   Final Result   IMPRESSION:     No evidence of bowel obstruction.     Findings were discussed with Dr. Erenest Blank by Johnn Hai, MD on 10/05/2020 9:50   AM.     Dictated by: Johnn Hai, MD. 10/05/2020 9:59 AM     I, Thornton Dales, MD, have reviewed the study and agree with the findings in   this report.  10/05/2020 10:38 AM     Fluoro Video Swallow W Speech   Final Result   IMPRESSION:     1. No intratracheal aspiration or laryngeal penetration with thin barium on the   one swallow, following multiple attempts.      2. Please see speech pathology report for full details.     Dictated by: Bess Harvest, MD. 10/04/2020 11:34 AM     I, Marni Griffon, MD, have reviewed the study and agree with the findings in   this report.  10/04/2020 12:23 PM     XR Chest/Upper Abdomen NG Tube Plcmnt   Final Result   IMPRESSION:     Nasoenteric feeding catheter tip overlies the stomach.     Dictated by: Jacki Cones, MD. 09/27/2020 10:30 PM     I, Thornton Dales, MD, have reviewed the study and agree with the findings in   this report.  09/28/2020 6:38 AM     XR Chest AP Portable    (Results Pending)   Fluoro Video Swallow W Speech    (  Results Pending)     MEDICATIONS:   Scheduled Meds:  ? acetaminophen  650 mg Per OG/NG Tube 4 times per day    ? aspirin  325 mg Per OG/NG Tube Daily   ? banana flakes-t-galactooligos.  10.75 g Per OG/NG Tube TID   ? budesonide-formoterol  2 puff Inhalation BID   ? cefTRIAXone  2 g Intravenous Q24H   ? celecoxib  200 mg Oral BID   ? enoxaparin  40 mg Subcutaneous Q24H   ? famotidine  20 mg Oral BID   ? gabapentin  300 mg Per OG/NG Tube 3 times per day   ? ipratropium-albuterol  3 mL Inhalation Q6H [place]   ? melatonin  5 mg Oral QHS   ? mirtazapine  15 mg Per OG/NG Tube QHS   ? nicotine  1 patch Transdermal Daily   ? prenatal vitamin  1 tablet Per OG/NG Tube Daily   ? protein supplement  30 mL Per OG/NG Tube Daily   ? rosuvastatin  10 mg Oral Daily   ? vancomycin  125 mg Per OG/NG Tube 4 times per day     Continuous Infusions:    PRN Meds:.docusate sodium, hydrocortisone, HYDROmorphone, ipratropium-albuterol, ondansetron, oxycodone, pantoprazole, promethazine, sennosides    ASSESSMENT & PLAN:  Caleb Robinson is a 69 y.o. 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     Perioperative Management: Recommend early ambulation, PT/OT, RT for airway clearance. Wound mgt, pain control, rehab, and DVT PPX per primary team.     E.coli Bacteremia: blood cx drawn for leukocytosis and concern for sepsis and found to 1/2 E.coli(see MET note 10/28)  - ID following and will defer to their recommendations, plan for 2wks IV ceftriaxone     C Diff Colitis: diarrhea improved overall, no abdominal pain   - Continue vancomycin 16m qid per peg (ID rec end date 11/5)  - following completion, will need to ppx vanc 1282monce q3 days while on IV abx for bacteremia     HTN:   - continue to hold amlodipine    COPD/Tobacco Use: no respiratory complaints   - continue symbicort BID and duonebs as needed  - continue nicotine patch 2133mstarted 10/23)  - visited with smoking cessation counselor, pt is  encouraged to quit    HLD:  - continue rosuvastatin daily     Mood Disorder/Insomnia:  - continue mirtazapine qHS    Severe Malnutrition:   - continue RD to optimize nutrition     Ppx: Per primary team    Please call with questions or concerns.    LesRomualdo BolkP  Pager: [phone]   Electronically signed by LesPatricia PesaNP at 10/10/2020  2:08 PM EDT

## 2020-10-10 NOTE — Progress Notes (Signed)
Formatting of this note might be different from the original.  For all Team C patient questions from 6am-5pm during the week please contact the Team C intern at [phone] or their pager. On weekdays between 5pm-6am and weekends please first attempt to contact the intern on call at [phone] or, secondarily, the junior ENT resident on call.    Otolaryngology--Head and Neck Surgery Progress Note    Patient Name: Caleb Robinson  Date: 10/10/2020  Admission Date: 09/27/2020  Service: Otolaryngology--Head and Neck Surgery    Subjective:   AFVSS. On RA. E. Coli sens returned; zosyn discontinued and started on ceftriaxone. Received PICC line for completion of outpatient IV abx. ID provided final recs upon discharge with IV abx regimen. SLP saw yesterday, recommended practice with small water trials using squeeze bottle. Decannulated without complication. Pulled out his NGT and nursing is having a hard time replacing presently    Objective:  General: NAD  HEENT: supple neck with midline trachea, surgical incision sites c/d/i, JP in place to bulb suction with ss output. Intraoral flap with some epidermolysis but good turgor, good cap refill and no evidence of congestion. Decannulated, some expected postoperative edema.   CV: pulses intact, regular rate  Resp: no increased work of breathing on TC  Ext: no cyanosis or swelling or rashes. Donor site under CAM boot, wound vac in place.    Neuro: EOMI    Assessment:   69 y.o. male with PMH of anterior OC SCCa, active smoker with COPD who is 13 Days Post-Op s/p 69yo M w/  now s/p Comp OC w/ segmental mandibulectmy, partial gloss, b/l ND, trach, L fibula free flap, STSG. Vessels: L Facial A, retromandibular V.     Neuro:   - Multimodal pain control    HEENT:   -RN flap checks q4h  - BID MD flap checks   - Vaseline ointment to incisions BID  - continuous pulse ox.   - MBSS  - Oral care    CV/Heme:  - Continue tele  - Notify MD if Systolic blood pressure > 170.   - Notify MD for sustained  pulse > 110 while pain is well controlled  - ASA 325    Pulm:   - Wean O2 to keep sats >92%    FENGI:  - Replete electrolytes to keep K >4.0, Mg>2.0  - Diet NPO diet OTHER (COMMENTS)  Nutren 1.5   - Bolus tube feeds    GU:  - Notify MD if UOP <30cc/h    ID:   - s/p unasyn course   - PO Vanc course for C. Diff through 11/15.  Will require PO vanc ppx for C. Diff every 3 days starting on 11/16 while on IV abx  - Zosyn for blood cx until 11/11 (can switch to Ceftriaxone pending sensitivities)  - blood growing E coli in 1 specimen and tracheal aspirate growing pasteurella.  - Probiotics    ENDO:   - SSI as appropriate    PPx:   - DVT: LVX 30 BID  - GI: H2     MSK:   - Will follow PT/OT     Dispo  - Floor    [name]  Resident Physician - PGY3  Otolaryngology - Head & Neck Surgery  Pager [phone]    Electronically signed by Nunzio Cobbs, MD at 10/10/2020  6:21 AM EDT

## 2020-10-10 NOTE — Progress Notes (Signed)
Formatting of this note might be different from the original.    Problem: Adult Inpatient Plan of Care  Goal: Plan of Care Review  Outcome: Ongoing, Progressing  Goal: Patient-Specific Goal (Individualization)  Outcome: Ongoing, Progressing    Electronically signed by Ihor Austin, RN at 10/10/2020 11:17 PM EDT

## 2020-10-11 NOTE — Progress Notes (Signed)
Formatting of this note might be different from the original.    Problem: Adult Inpatient Plan of Care  Goal: Plan of Care Review  Outcome: Ongoing, Progressing  Goal: Patient-Specific Goal (Individualization)  Outcome: Ongoing, Progressing  Goal: Absence of Hospital-Acquired Illness or Injury  Outcome: Ongoing, Progressing  Goal: Optimal Comfort and Wellbeing  Outcome: Ongoing, Progressing  Goal: Readiness for Transition of Care  Outcome: Ongoing, Progressing  Goal: Rounds/Family Conference  Outcome: Ongoing, Progressing    Problem: Infection  Goal: Infection Symptom Resolution  Outcome: Ongoing, Progressing    Problem: Communication Impairment (Artificial Airway)  Goal: Effective Communication  Outcome: Ongoing, Progressing    Problem: Device-Related Complication Risk (Artificial Airway)  Goal: Optimal Device Function  Outcome: Ongoing, Progressing    Problem: Skin and Tissue Injury (Artificial Airway)  Goal: Absence of Device-Related Skin or Tissue Injury  Outcome: Ongoing, Progressing    Electronically signed by Darden Amber, RN at 10/11/2020 10:13 AM EDT

## 2020-10-11 NOTE — Progress Notes (Signed)
Formatting of this note is different from the original.  Images from the original note were not included.  Caleb  Speech-Language Pathology Charting Robinson  Modified Barium Swallow    Date of Service: 10/11/2020  Patient: Caleb Robinson  MRN: [id number]  CSN: [id number]  DOB: May 08, 1951  Age: 69 y.o.    Attending Provider: Roney Marion, MD   Referring Provider: Donne Hazel, NP  Location of Treatment: [facility]  Date Therapy Plan Established [phone]): 10/11/20  Date Patient First Became Aware of Symptoms (11): 09/27/20  Date Service Initiated by Billing Provider (19): 10/03/20  Funding: Payor: [employer] MEDICARE COMPLETE /  /  /   Primary Diagnosis:   1. Hypovolemic shock    2. Dysphonia    3. Cancer of oral cavity    4. Oral phase dysphagia      Treatment Diagnosis:   1. Hypovolemic shock    2. Dysphonia    3. Cancer of oral cavity    4. Oral phase dysphagia      Patient Problem List [ICD-10]    Diagnosis Date Noted   ? E coli bacteremia [R78.81, B96.20]    ? C. difficile colitis [A04.72]    ? Primary hypertension [I10]    ? Chronic obstructive pulmonary disease [J44.9]    ? Acute blood loss anemia [D62]    ? Hypotension after procedure [I95.81]    ? On enteral nutrition [Z78.9]    ? Hypokalemia [E87.6]    ? Hypomagnesemia [E83.42]    ? Tobacco abuse [Z72.0]    ? Hypovolemic shock [R57.1]    ? 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   Activity Status: Skill not obtained at this time  Nutritional Screen: Patient is NPO  Barriers to Learning: No Barriers    PERTINENT HISTORY     History of Present Illness:  Caleb Robinson is a 69 y.o. male with a history of  anterior OC SCCa, active smoker with COPD who is 9 Days Post-Op s/p 69yo M w/  now s/p Comp  OC w/ segmental mandibulectmy, partial gloss, b/l ND, trach, L fibula free flap, STSG on 09/27/20. He was referred for an MBSS to evaluate safety to initiate PO.   The patient was alone in the session.    Patient's stated goal is have tube removed    Past Medical History: The patient  has no past medical history on file.   Past Surgical History: The patient  has a past surgical history that includes pr excis tongue,mouth,jaw,rad neck (Bilateral, 09/27/2020); pr rad resect tumor soft tiss neck/ant thorax 5+cm (Right, 09/27/2020); pr tracheostomy, planned (N/A, 09/27/2020); pr bone-skin graft, microvascular (N/A, 09/27/2020); pr split grft trunk,arm,leg <100 sqcm (Left, 09/27/2020); pr reconstruc mouth complex (Bilateral, 09/27/2020); pr hyoid myotomy & suspension (Bilateral, 09/27/2020); and pr exploration n/flwd surg neck artery (09/27/2020).   Social History: The patient  reports that he has been smoking cigarettes. He has been smoking about 0.25 packs per day. He has never used smokeless tobacco. He reports current alcohol use. He reports that he does not use drugs.   Relevant Imaging:  Chest CT 10/05/20  IMPRESSION:     No pulmonary embolism.    Right lower lobe consolidation and scattered areas of mucoid  impaction, likely  aspiration pneumonia.    A few small pulmonary nodules measuring up to 8 mm. New left upper lobe nodule  measures 6 mm. The nodules are concerning for metastases.    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 Consider echocardiography for further evaluation.    Prior SLP Services: seen for PMSV    EVALUATION     Current Respiratory Status:  room air, tracheotomy tolerating capping  Current Communication:  Verbal at the conversation level but speech marginally intelligible    Current (pre-MBS) Intake/Diet:  ? Functional Oral Intake Scale (FOIS): 1 - Tube Dependent: no oral intake  ? Intake status: DHT  ? Diet grade/IDDSI Level:  NA -  Patient NPO  Weight:    Wt Readings from Last 3 Encounters:   10/11/20 (!) 44.6 kg (98 lb 5.2 oz)   09/12/20 (!) 42.9 kg (94 lb 9.6 oz)   08/28/20 45.9 kg (101 lb 4.8 oz)     ? BMI: 16.31    Oral Mechanism/Cranial Nerve Exam:  Patient participated in oral mechanism/cranial nerve examination.     Oral structures:  Lips:  lower lip displaced by surgery  Cheek:  Normal in appearance  Tongue/FOM:  surgical resection/reconstruction  Mandible:  surgical resection/reconstruction  Maxilla: Normal in appearance  Palate: unable to visualize  Dentition: edentulous    Condition of oral cavity:  Post-surgical changes, Incisions, Pooling mucus/saliva    CN V Trigeminal-   Gross facial sensation: Reduced   Observation of jaw movement:  Jaw midline, Trismus; max oral opening 1mm     CN VII Facial -   Taste:  unable to assess at this time  Facial Symmetry: symmetric    Lips:    Labial strength: Reduced  Labial range of motion: Reduced  Buccal puff: Reduced  Drooling: Present    CN IX  Glossopharyngeal -   Lingual lateralization: unable to visualize remaining tongue   Lingual elevation:  unable to visualize remaining tongue     CN X Vagus -   Palatal elevation:  unable to visualize  Vocal quality: Strong and clear    GRBAS Scale   0 to 3 (0 = normal, 1 = mild, 2 = moderate, 3 = severe).     Grade: 0  Roughness: 0  Breathiness: 0  Asthenia: 0  Strain: 0  Total: 0/15    Volitional swallow:  present    CN XII Hypoglossal -   The tongue was assessed for structure, symmetry, range of motion and strength:     Surgical changes      Modified Barium Swallow  (CPT 92611: Motion fluoroscopic evaluation of swallowing function by cine or video recording)    NOTE: this study was performed for interpretation only of the oropharyngeal and pharyngoesophageal stages of swallowing.  It is not intended to diagnose any other radiologic abnormalities or substitute for a formal esophagram study.        The patient was positioned seated in Hausted chair  with slight recline with left lateral viewing planes captured.  The patient was presented with graduated volumes of commercially prepared/standardized barium consistencies of thin and nectar liquids via syringe. Fluoroscope pulse rate was set to 30 p/sec. Video capture rate was 30 FPS.    [facility], [place]    Modified Barium Swallow Study  Fluoroscopic Evaluation of Swallowing Function CPT Code [phone]    Evaluation Year: 2021  Evaluating Clinician: Lorie Phenix    Patient ID: [id number]  Study Number: 1  Patient Name:   Age: 60  Gender: Male    MEDICAL HISTORY:   Primary (admitting) Diagnosis:   Head & Neck Cancer     COVID-19 Positive: No     MBSImP ID: [id number]     MBSImP Overall Impression:   Lip closure for intraoral bolus containment resulted in bolus escape that progressed to the mid-chin.  Tongue control during bolus hold allowed bolus escape to the lateral buccal cavity/floor of mouth.  Bolus preparation and mastication received the highest impairment score; solid not given due to patient safety concerns related to oral impairment.  Bolus transport/lingual motion yielded only minimal to no tongue motion.  Oral residue was the majority of the bolus.  Initiation of the pharyngeal swallow occurred as the bolus head was at the posterior laryngeal surface of the epiglottis.  Soft palate elevation resulted in no bolus between the soft palate and the pharyngeal wall.  Laryngeal elevation demonstrated complete superior movement of the thyroid cartilage with complete approximation of the arytenoids to the epiglottic petiole.  Anterior hyoid excursion demonstrated partial anterior movement.  Epiglottic movement resulted in partial inversion.  Laryngeal vestibular closure was complete, as indicated by no air or contrast within the laryngeal vestibule at the height of the swallow.  Pharyngeal stripping wave was present, but diminished.  Pharyngeal contraction could not be  determined due to logistical reasons not related to physiologic impairment.  Pharyngoesophageal segment opening was completely distended for complete duration with no obstruction of bolus flow.  Tongue base retraction allowed a trace column of contrast or air between the retracted tongue base and the posterior pharyngeal wall.  Pharyngeal residue was a collection of residue within or on pharyngeal structures.  Esophageal clearance in the upright position could not be assessed due to logistical reasons not related to physiologic impairment.    COMPONENT Number and Descriptor Scale CURRENT Score and Descriptor   1 Lip Closure (0-4) 3 Resulted in bolus escape that progressed to the mid-chin.     2 Tongue Control/Bolus Hold (0-3) 1 Allowed bolus escape to the lateral buccal cavity/floor of mouth.     3 Bolus Prep/Mastication (0-3) 3 Solid not given due to patient safety concerns related to oral impairment.     4 Bolus Transport/Lingual Motion (0-4) 4 Yielded only minimal to no tongue motion.     5 Oral Residue (0-4) 3 Was the majority of the bolus.     6 Initiation of Pharyngeal Swallow (0-4) 2 Occurred as the bolus head was at the posterior laryngeal surface of the epiglottis.     7 Soft Palate Elevation (0-4) 0 Resulted in no bolus between the soft palate and the pharyngeal wall.     8 Laryngeal Elevation (0-3) 0 Demonstrated complete superior movement of the thyroid cartilage with complete approximation of the arytenoids to the epiglottic petiole.     9 Anterior Hyoid Excursion (0-2) 1 Demonstrated partial anterior movement.     10 Epiglottic Movement (0-2) 1 Resulted in partial inversion.     11 Laryngeal Vestibular Closure (0-2) 0 Was complete, as indicated by no air or contrast within the laryngeal vestibule at the height of the swallow.     12 Pharyngeal Stripping Wave (0-2) 1 Was present, but diminished.     13 Pharyngeal Contraction (0-3)  Could not be determined due to logistical reasons not related to  physiologic impairment.     [place]  Pharyngoesophageal Segment Opening (0-3) 0 Was completely distended for complete duration with no obstruction of bolus flow.     15 Tongue Base Retraction (0-4) 1 Allowed a trace column of contrast or air between the retracted tongue base and the posterior pharyngeal wall.     16 Pharyngeal Residue (0-4) 2 Was a collection of residue within or on pharyngeal structures.     17 Esophageal Clearance (upright) (0-4)  Could not be assessed due to logistical reasons not related to physiologic impairment.       Oral Impairment Score:  16  Pharyngeal Impairment Score:  5 (absence of score, component 13)  Esophageal Impairment Score:   --- (absence of score, component 17)   Laryngeal Penetration and Aspiration:  Penetration was observed in today's study. Nectar-thick, Thin Contrast entered the airway, remained above the vocal folds, and were ejected from the airway.     IMAGES     IMPRESSIONS     The patient presents with severely impaired oral swallowing function at this time requiring use of catheter extension on syringe for bolus placement.  Prognosis for meeting nutritional needs by mouth is guarded at this time secondary to poor efficiency. Results of this evaluation and a plan of care were shared with the patient and managing team, via verbal instruction and demonstration.   Understanding was expressed via teach back.    RECOMMENDATIONS     Intake Recommendations:  ? Route: Intiiate liquids via squeeze bottle with continued use of TF to meet nutrition secondary to poor swallow efficiency.  Given patients nutritional state patient would likely benfit from consideration of long term nutritional needs via PEG  ? Diet grade/IDDSI Level: 0- Thin, 1 - Slightly thick  ? Functional Oral Intake Scale (FOIS):  3- Tube feed with consistent oral intake    Therapy Recommendations:  ? Continued practice with catheter extension to improve swallow efficiency    GOALS     Long Term Goals:   The patient  will safely tolerate oral bolus trials   The patient will demonstrate improved swallowing function via repeat clinical evaluation, videoendoscopy/videofluoroscopy, and patient self-rating scores.   Short-Term Goals:    The patient will be able to recall safety guidelines with no more than minimal cuing.   In progress   The patient will be able to accept liquid boluses via catheter extension with minimal cuing.  In progress   The patient will verbalize/demonstrate understanding of evaluation results, recommendations, and the swallowing guidelines.   In progress    The patient will be re-evaluated on an ongoing basis to determine the appropriate plan of care following each therapy session. Upon the tenth swallowing therapy session or upon discharge, the patient will be formally re-evaluated and a new plan of care established.  Thank you for this consult and the opportunity to care for this patient. Should you have any questions or need any additional information, please do not hesitate to contact me by calling the [facility] [facility] at [phone]. Thank you.  Lorie Phenix, MA.CCC-SLP, BCS-S  Speech-Language Pathologist  Board Certified Specialist in Swallowing and Swallowing Disorders  Scheduling: [phone]  Office:  [phone]  Fax: [phone]  Pager [phone]    Dr. Erenest Blank was made aware of the results of the exam and recommendations above via verbal Robinson.  ______________________________________________________________________    Physician's Statement:  In accordance with [facility]/Medicare guidelines, I certify (by co-signing this document) the need for these services furnished under this plan of  treatment and while under my care.         Electronically signed by Lorie Phenix, [place] CCC-SLP at 10/11/2020  6:46 PM EDT

## 2020-10-11 NOTE — Progress Notes (Signed)
Formatting of this note might be different from the original.  Plan of Care:   Nutrition reassessment complete.  Please see progress note from same date for complete assessment and interventions.      Maryella Shivers, RDN, LDN  Pager [phone]    Electronically signed by Alvino Blood, RD at 10/11/2020 12:56 PM EDT

## 2020-10-11 NOTE — Progress Notes (Signed)
Formatting of this note might be different from the original.  For all Team C patient questions from 6am-5pm during the week please contact the Team C intern at [phone] or their pager. On weekdays between 5pm-6am and weekends please first attempt to contact the intern on call at [phone] or, secondarily, the junior ENT resident on call.    Otolaryngology--Head and Neck Surgery Progress Note    Patient Name: Caleb Robinson  Date: 10/11/2020  Admission Date: 09/27/2020  Service: Otolaryngology--Head and Neck Surgery    Subjective:   AFVSS. On 3L NC. MBSS did not occur yesterday due to busy radiology schedule, planned for today. Jp1 (lower leg) removed.    Objective:  General: NAD  HEENT: supple neck with midline trachea, surgical incision sites c/d/i, JP in place to bulb suction with ss output. Intraoral flap with some epidermolysis but good turgor, good cap refill and no evidence of congestion. Decannulated, some expected postoperative edema.   CV: pulses intact, regular rate  Resp: no increased work of breathing on TC  Ext: no cyanosis or swelling or rashes. Donor site under CAM boot, wound vac in place.    Neuro: EOMI    Assessment:   69 y.o. male with PMH of anterior OC SCCa, active smoker with COPD who is 14 Days Post-Op s/p 69yo M w/  now s/p Comp OC w/ segmental mandibulectmy, partial gloss, b/l ND, trach, L fibula free flap, STSG. Vessels: L Facial A, retromandibular V.     Neuro:   - Multimodal pain control    HEENT:   -RN flap checks q4h  - BID MD flap checks   - Vaseline ointment to incisions BID  - continuous pulse ox.   - MBSS today  - Oral care    CV/Heme:  - Continue tele  - Notify MD if Systolic blood pressure > 170.   - Notify MD for sustained pulse > 110 while pain is well controlled  - ASA 325    Pulm:   - Wean O2 to keep sats >92%    FENGI:  - Replete electrolytes to keep K >4.0, Mg>2.0  - Diet NPO diet OTHER (COMMENTS)  Nutren 1.5   - Bolus tube feeds    GU:  - Notify MD if UOP <30cc/h    ID:   -  s/p unasyn course   - PO Vanc course for C. Diff through 11/15.  Will require PO vanc ppx for C. Diff every 3 days starting on 11/16 while on IV abx  - Zosyn for blood cx until 11/11 (can switch to Ceftriaxone pending sensitivities)  - blood growing E coli in 1 specimen and tracheal aspirate growing pasteurella.  - Probiotics    ENDO:   - SSI as appropriate    PPx:   - DVT: LVX 30 BID  - GI: H2     MSK:   - Will follow PT/OT     Dispo  - Floor  - Discharge this week    [name]  Resident Physician - PGY3  Otolaryngology - Head & Neck Surgery  Pager [phone]    Electronically signed by Nunzio Cobbs, MD at 10/11/2020  7:46 AM EDT

## 2020-10-11 NOTE — Progress Notes (Signed)
Formatting of this note is different from the original.  HOSPITALIST CONSULT PROGRESS NOTE    Patient: Caleb Robinson  Date of service: 10/11/2020  Date of admission:  09/27/2020  Service/attending: ENT-C / Nolberto Hanlon    Active Problems:  Patient Active Problem List   Diagnosis   ? Cancer of oral cavity   ? Hypovolemic shock   ? Chronic obstructive pulmonary disease   ? Acute blood loss anemia   ? Hypotension after procedure   ? On enteral nutrition   ? Hypokalemia   ? Hypomagnesemia   ? Tobacco abuse   ? C. difficile colitis   ? Primary hypertension   ? E coli bacteremia     Subjective     Pt resting in bed and watching TV. He states feeling much better today after sleeping well overnight. Denies cp, dizziness, palpitations, sob, abdominal pain or n/v.    Objective     Vitals:  Blood pressure 146/52, pulse 65, temperature 36.4 C (97.5 F), temperature source Axillary, resp. rate 16, height 162.6 cm (5' 4" ), weight (!) 44.6 kg (98 lb 5.2 oz), SpO2 100 %.    Physical Exam:  GENERAL: frail appearing, no acute distress.  EYES:  Extraocular movements are intact.  Sclerae nonicteric.  ENT: Postsurgical changes. DHT to right nare   NECK: decannulated and soft dressing to site. Incision intact   CARDIOVASCULAR: RRR. No murmurs, rubs, or gallops.  RESPIRATORY: Normal respiratory effort. Lungs clear bilaterally  GASTROINTESTINAL: Abdomen is soft, ntnd,active bs   MUSCULOSKELETAL: No BLE edema   NEURO/PSYCH:  Pleasant and cooperative. Alert and oriented.  Motor and sensory function grossly intact and symmetrical in all four extremities.    PERTINENT LABS:  Lab Results   Component Value Date    WBC 14.31 (H) 10/11/2020    HGB 8.3 (L) 10/11/2020    HCT 25.7 (L) 10/11/2020    MCV 96.3 (H) 10/11/2020    PLT 657 (H) 10/11/2020    NEUTROABS 5.89 09/12/2020     Lab Results   Component Value Date    NA 139.0 10/11/2020    K 4.5 10/11/2020    CL 103 10/11/2020    CO2CT 30 (H) 10/11/2020    BUN 26 10/11/2020    CREATININE 0.9  10/11/2020    GLUCOSE 94.0 10/11/2020    CALCIUM 8.7 10/11/2020    MG 2.2 10/11/2020    AST  10/05/2020      Comment:      No Result    ALT 63 (H) 10/05/2020    ALKPHOS 222 (H) 10/05/2020    PROT 5.0 (L) 10/05/2020     Lab Results   Component Value Date    INR 1.24 (H) 09/27/2020     Lab Results   Component Value Date    TSH 1.88 10/09/2020    HGBA1C 4.7 09/29/2020    VITAMINB12 895 09/12/2020    FOLATE 14.2 09/12/2020     No results found for: GLUCOSEU, PROTEINUA, BILIRUBINUR, UROBILINOGEN, LABPH, BLOODU, KETONESU, NITRITE, LEUKOCYTESUR, LABSPEC, COLORU  Lab Results   Component Value Date    INR 1.24 (H) 09/27/2020     No results found for: GLUF  No results found for: PHENYTOIN, PHENOBARB, CBMZ, LITHIUM  No results found for: CHOL, TRIG, HDL, LDLCALC  No results found for: SEDIMENTATIO, CRPLOWSENSIT  No results for input(s): [place] in the last 72 hours.  No results for input(s): FINALREPORT, PRELIMINARYR in the last 72 hours.    RADIOLOGY:  XR  Chest/Upper Abdomen NG Tube Plcmnt   Final Result   IMPRESSION:     Dobbhoff tube tip overlies the stomach.     Dictated by: Bess Harvest, MD. 10/10/2020 2:37 PM     I, Thornton Dales, MD, have reviewed the study and agree with the findings in   this report.  10/10/2020 3:00 PM     XR Chest/Upper Abdomen NG Tube Plcmnt   Final Result   IMPRESSION:     Weighted feeding catheter tip overlies the stomach.     Findings were discussed with Dr. Erenest Blank by Bess Harvest, MD on 10/06/2020 1:22   PM.     Dictated by: Bess Harvest, MD. 10/06/2020 1:24 PM     I, Ernestina Patches, MD, have reviewed the study and agree with the findings in this   report.  10/06/2020 1:32 PM     CT Chest Pulmonary Embolism W Contrast   Final Result   IMPRESSION:      No pulmonary embolism.     Right lower lobe consolidation and scattered areas of mucoid impaction, likely   aspiration pneumonia.     A few small pulmonary nodules measuring up to 8 mm. New left upper lobe nodule   measures 6 mm. The  nodules are concerning for metastases.     Marked thickening of the mitral valve leaflets with evidence of prolapse as   well as calcifications. Consider echocardiography for further evaluation.     Dictated by: Dellia Nims, MD,PhD. 10/05/2020 12:38 PM     I, Antony Blackbird, MD,PhD, have reviewed the study and agree with the findings in   this report.  10/05/2020 12:42 PM     XR Chest AP Portable   Final Result   IMPRESSION:    Mild interstitial edema and pulmonary vascular redistribution which could be   seen with increased right heart pressures.   Right basilar airspace opacification could represent  infection or aspiration.     Findings were discussed with Dr. Welton Flakes by Dara Hoyer, MD on 10/05/2020 9:48   AM.      Dictated by: Dara Hoyer, MD. 10/05/2020 9:53 AM     I, Larose Kells, MD, have reviewed the study and agree with the findings in   this report.  10/05/2020 10:21 AM     XR Abdomen AP   Final Result   IMPRESSION:     No evidence of bowel obstruction.     Findings were discussed with Dr. Erenest Blank by Johnn Hai, MD on 10/05/2020 9:50   AM.     Dictated by: Johnn Hai, MD. 10/05/2020 9:59 AM     I, Thornton Dales, MD, have reviewed the study and agree with the findings in   this report.  10/05/2020 10:38 AM     Fluoro Video Swallow W Speech   Final Result   IMPRESSION:     1. No intratracheal aspiration or laryngeal penetration with thin barium on the   one swallow, following multiple attempts.      2. Please see speech pathology report for full details.     Dictated by: Bess Harvest, MD. 10/04/2020 11:34 AM     I, Marni Griffon, MD, have reviewed the study and agree with the findings in   this report.  10/04/2020 12:23 PM     XR Chest/Upper Abdomen NG Tube Plcmnt   Final Result   IMPRESSION:     Nasoenteric feeding catheter tip overlies the stomach.     Dictated by: Jacki Cones,  MD. 09/27/2020 10:30 PM     I, Thornton Dales, MD, have reviewed the study and agree with the findings in   this  report.  09/28/2020 6:38 AM     XR Chest AP Portable    (Results Pending)   Fluoro Video Swallow W Speech    (Results Pending)     MEDICATIONS:   Scheduled Meds:  ? acetaminophen  650 mg Per OG/NG Tube 4 times per day   ? aspirin  325 mg Per OG/NG Tube Daily   ? banana flakes-t-galactooligos.  10.75 g Per OG/NG Tube TID   ? budesonide-formoterol  2 puff Inhalation BID   ? cefTRIAXone  2 g Intravenous Q24H   ? celecoxib  200 mg Oral BID   ? enoxaparin  40 mg Subcutaneous Q24H   ? famotidine  20 mg Oral BID   ? gabapentin  300 mg Per OG/NG Tube 3 times per day   ? ipratropium-albuterol  3 mL Inhalation Q6H [place]   ? melatonin  5 mg Oral QHS   ? mirtazapine  15 mg Per OG/NG Tube QHS   ? nicotine  1 patch Transdermal Daily   ? prenatal vitamin  1 tablet Per OG/NG Tube Daily   ? protein supplement  30 mL Per OG/NG Tube Daily   ? rosuvastatin  10 mg Oral Daily   ? vancomycin  125 mg Per OG/NG Tube 4 times per day     Continuous Infusions:    PRN Meds:.docusate sodium, hydrocortisone, HYDROmorphone, ipratropium-albuterol, ondansetron, oxycodone, pantoprazole, promethazine, sennosides    ASSESSMENT & PLAN:  Mr. Hinch is a 69 y.o. 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     Perioperative Management: Recommend early ambulation, PT/OT, RT for airway clearance. Wound mgt, pain control, rehab, and DVT PPX per primary team.     E.coli Bacteremia: blood cx drawn for leukocytosis and concern for sepsis and found to 1/2 E.coli(see MET note 10/28)  - ID following and will defer to their recommendations, plan for 2wks IV ceftriaxone     C Diff Colitis: diarrhea improved overall, no abdominal pain   - Continue vancomycin 140m qid per peg (ID rec end date 11/5)  - following completion, will need to ppx vanc [id number] q3 days while on IV abx for bacteremia     HTN:   - can resume  amlodipine 97m60maily     COPD/Tobacco Use: no respiratory complaints, on 3LNC overnight but weaned to RA this morning  - continue symbicort BID and scheduled duonebs   - continue nicotine patch 40m91mtarted 10/23)  - visited with smoking cessation counselor, pt is encouraged to quit    HLD:  - continue rosuvastatin daily     Mood Disorder/Insomnia:  - continue mirtazapine qHS    Severe Malnutrition:   - continue RD to optimize nutrition     Ppx: Per primary team    Please call with questions or concerns.    LeslRomualdo Bolk  Pager: [phone]  Electronically signed by LeslPatricia PesaP at 10/11/2020  1:12 PM EDT

## 2020-10-11 NOTE — Consults (Signed)
Formatting of this note is different from the original.  General Surgery Consultation Note  [facility]    Patient Name: Caleb Robinson  DOB: 09/01/1951  Age: 69 y.o.  MRN: [id number]  Admission Date: 10/11/2020    Consulting Service: ENT  Consulting Attending: Roney Marion, MD    Consult Orders   Procedures   ? Inpatient consult to Nutrition     Standing Status:   Standing     Number of Occurrences:   1     Order Specific Question:   Reason for Referral?     Answer:   TF   ? Inpatient consult to Nutrition     Standing Status:   Standing     Number of Occurrences:   1     Order Specific Question:   Reason for Referral?     Answer:   tube feeds   ? IP Consult Adult Nutrition     Standing Status:   Standing     Number of Occurrences:   1     Order Specific Question:   Reason for Consult     Answer:   tube feeds   ? IP Consult Smoking Cessation Program     Standing Status:   Standing     Number of Occurrences:   1     Order Specific Question:   Reason for Consult     Answer:   actively smoking up to time of surgery   ? IP Consult Vascular Access Nursing (This is not a PICC order)     Standing Status:   Standing     Number of Occurrences:   1     Order Specific Question:   Indication for Consult     Answer:   Infusion of IV antibiotics     Order Specific Question:   Estimated duration of infusion:     Answer:   </= 5 days     Order Specific Question:   Conditions for VAIN referral to IR (Select all that apply)     Answer:   Patient has none of the above conditions (Proceed to next question)     Order Specific Question:   Conditions for VAIN (Select all that apply)     Answer:   Patient has none of the above conditions listed under either the Referral or Vain question     Order Specific Question:   Diagnosis     Answer:   flap                                                  History of Present Illness     69 y.o. male with PMH of anterior OC SCCa who is 14 Days Post-Op s/p resection  and free flap reconstruction. Patient has been requiring NG tube feeds for nutritional support as he does not have adequate PO intake. He was seen by clinical nutrition today, plan to be dc on EN support given poor PO intake 2/2 to free flap swelling. Patient has pulled NGT multiple times since the OR per nursing. Otherwise doing well, likely to be discharged this week. No Hx of abdominal surgeries.  Past Medical & Surgical History   Past Medical History:  History reviewed. No pertinent past medical history.  Past Surgical History:  Past Surgical History:   Procedure Laterality Date   ? PR BONE-SKIN GRAFT, MICROVASCULAR N/A 09/27/2020    Procedure: FREE FLAP, OSTEOCUTANEOUS/ NOT ILIAC, METATARSAL OR GREAT TOE;  Surgeon: Roney Marion, MD;  Location: [facility];  Service: Otolaryngology   ? PR EXCIS TONGUE,MOUTH,JAW,RAD NECK Bilateral 09/27/2020    Procedure: COMPOSITE GLOSSECTOMY WITH RADICAL NECK DISSECTION;  Surgeon: Jerrye Beavers Day, MD;  Location: [facility];  Service: Otolaryngology   ? PR EXPLORATION, CAROTID ARTERY  09/27/2020    Procedure: EXPLORATION W/WO LYSIS OF ARTERY; CAROTID ARTERY;  Surgeon: Roney Marion, MD;  Location: [facility];  Service: Otolaryngology   ? PR HYOID MYOTOMY & SUSPENSION Bilateral 09/27/2020    Procedure: HYOID MYOTOMY AND SUSPENSION;  Surgeon: Roney Marion, MD;  Location: [facility];  Service: Otolaryngology   ? PR RAD RESECT TUMOR SOFT TISS NECK/ANT THORAX 5+CM Right 09/27/2020    Procedure: RESECTION, RADICAL, NEOPLASM CHEST WALL 5 CM OR GREATER;  Surgeon: Jerrye Beavers Day, MD;  Location: [facility];  Service: Otolaryngology   ? PR RECONSTRUC MOUTH COMPLEX Bilateral 09/27/2020    Procedure: VESTIBULOPLASTY; COMPLEX (INCLUDING RIDGE EXTENSION, MUSCLE REPOSITIONING);  Surgeon: Roney Marion, MD;  Location: [facility];  Service: Otolaryngology   ? PR SPLIT GRFT TRUNK,ARM,LEG <100 SQCM Left 09/27/2020    Procedure:  SPLIT-THICKNESS AUTOGRAFT, TRUNK, ARMS, LEGS; FIRST 100 SQ CM OR LESS, OR 1% BODY AREA INFANTS/CHILDREN;  Surgeon: Roney Marion, MD;  Location: [facility];  Service: Otolaryngology   ? PR TRACHEOSTOMY, PLANNED N/A 09/27/2020    Procedure: PERCUTANEOUS TRACHEOSTOMY;  Surgeon: Jerrye Beavers Day, MD;  Location: [facility];  Service: Otolaryngology     Social History:  Social History     Socioeconomic History   ? Marital status: Single     Spouse name: Not on file   ? Number of children: Not on file   ? Years of education: Not on file   ? Highest education level: Not on file   Occupational History   ? Not on file   Tobacco Use   ? Smoking status: Current Every Day Smoker     Packs/day: 0.25     Types: Cigarettes   ? Smokeless tobacco: Never Used   Substance and Sexual Activity   ? Alcohol use: Yes     Comment: 1 drink a month   ? Drug use: Never   ? Sexual activity: Not on file   Other Topics Concern   ? Not on file   Social History Narrative   ? Not on file     Social Determinants of Health     Financial Resource Strain:    ? Difficulty of Paying Living Expenses: Not on file   Food Insecurity:    ? Worried About Running Out of Food in the Last Year: Not on file   ? Ran Out of Food in the Last Year: Not on file   Transportation Needs:    ? Lack of Transportation (Medical): Not on file   ? Lack of Transportation (Non-Medical): Not on file   Physical Activity:    ? Days of Exercise per Week: Not on file   ? Minutes of Exercise per Session: Not on file   Stress:    ? Feeling of Stress : Not on file  Social Connections:    ? Frequency of Communication with Friends and Family: Not on file   ? Frequency of Social Gatherings with Friends and Family: Not on file   ? Attends Religious Services: Not on file   ? Active Member of Clubs or Organizations: Not on file   ? Attends Archivist Meetings: Not on file   ? Marital Status: Not on file   Intimate Partner Violence:    ? Fear of Current or Ex-Partner: Not on file    ? Emotionally Abused: Not on file   ? Physically Abused: Not on file   ? Sexually Abused: Not on file   Housing Stability:    ? Unable to Pay for Housing in the Last Year: Not on file   ? Number of Places Lived in the Last Year: Not on file   ? Unstable Housing in the Last Year: Not on file     Family History:  Family History   Problem Relation Age of Onset   ? Anesthetic Reactions Neg Hx      Outpatient Medications:  Outpatient Medications Marked as Taking for the 09/27/20 encounter [facility] Encounter)   Medication Sig Dispense Refill   ? albuterol 0.083 % (Proventil) 2.5 mg /3 mL (0.083 %) nebulizer solution Inhale 2.5 mg into the lungs if needed.      ? amLODIPine (NORVASC) 5 mg tablet Take 5 mg by mouth daily.      ? ascorbic acid (vitamin C) (vitamin C) 100 mg tablet Take 100 mg by mouth daily.     ? aspirin 81 mg delayed release enteric coated tablet Take 81 mg by mouth daily.      ? cholecalciferol (vitamin D3) 25 mcg (1,000 unit) capsule Take 1,000 Units by mouth daily.      ? ferrous sulfate 325 mg (65 mg iron) tablet Take 65 mg of iron by mouth each morning before breakfast.      ? fluticasone-umeclidinium-vilanterol (Trelegy Ellipta) 100-62.5-25 mcg/dose powder inhaler Inhale 1 puff into the lungs once daily.      ? gabapentin (NEURONTIN) 100 mg capsule Take 200 mg by mouth 3 times daily.      ? ibuprofen (MOTRIN) 800 mg tablet Take 800 mg by mouth if needed.      ? melatonin 10 mg tablet Take 10 mg by mouth at bedtime as needed.      ? mirtazapine (REMERON) 15 mg tablet Take 15 mg by mouth at bedtime.      ? multivitamin tablet Take 1 tablet by mouth daily.      ? OTHER MEDICATION daily. appetite supplement     ? potassium chloride (MICRO-K) 10 mEq extended release capsule Take by mouth daily.     ? rosuvastatin (CRESTOR) 10 mg tablet Take 10 mg by mouth daily.      ? zinc gluconate 50 mg tablet Take 50 mg by mouth daily.     ? [DISCONTINUED] potassium 99 mg Tablet Take 99 mg by mouth daily.         Allergies:  Cat dander                                                 Review of Systems   Review of Systems   Constitutional: Negative.    HENT: Negative.    Eyes: Negative.  Respiratory: Negative.    Cardiovascular: Negative.    Gastrointestinal: Negative.    Genitourinary: Negative.    Musculoskeletal: Negative.    Skin: Negative.      Patient intake sheet ROS reviewed.                                                 Current Medications   Scheduled:  ? acetaminophen  650 mg Per OG/NG Tube 4 times per day   ? aspirin  325 mg Per OG/NG Tube Daily   ? banana flakes-t-galactooligos.  10.75 g Per OG/NG Tube TID   ? budesonide-formoterol  2 puff Inhalation BID   ? cefTRIAXone  2 g Intravenous Q24H   ? celecoxib  200 mg Oral BID   ? enoxaparin  40 mg Subcutaneous Q24H   ? famotidine  20 mg Oral BID   ? gabapentin  300 mg Per OG/NG Tube 3 times per day   ? ipratropium-albuterol  3 mL Inhalation Q6H [place]   ? melatonin  5 mg Oral QHS   ? mirtazapine  15 mg Per OG/NG Tube QHS   ? nicotine  1 patch Transdermal Daily   ? prenatal vitamin  1 tablet Per OG/NG Tube Daily   ? protein supplement  30 mL Per OG/NG Tube Daily   ? rosuvastatin  10 mg Oral Daily     Continuous:    PRN:  docusate sodium, hydrocortisone, HYDROmorphone, ipratropium-albuterol, ondansetron, oxycodone, pantoprazole, promethazine, sennosides                                                 Laboratory Findings   Labs:  Recent Labs     10/09/20  0527 10/10/20  0527 10/11/20  0212   WBC 14.42* 13.17* 14.31*   HGB 9.2* 9.9* 8.3*   HCT 28.7* 29.6* 25.7*   MCV 96.3* 95.5* 96.3*   MCH 30.9 31.9* 31.1*   MCHC 32.1 33.4 32.3   RDW 14.7* 14.6* 14.6*   PLT 553* 633* 657*     Recent Labs     10/09/20  0527 10/10/20  0527 10/11/20  0212   NA 142.0 140.0 139.0   K 5.1 4.8 4.5   CL 107 105 103   CO2CT 29 30* 30*   BUN 26 21 26    CREATININE 0.9 0.8 0.9   GLUCOSE 82.0 81.0 94.0     Recent Labs     10/09/20  0527 10/10/20  0527 10/11/20  0212   CALCIUM 8.6 9.0 8.7   MG 2.2  2.2 2.2     No results for input(s): PROTIME, INR, APTT in the last 72 hours.  No results for input(s): BILITOT, BILIDIR, AST, ALT, ALKPHOS, PROT, ALBUMIN in the last 72 hours.                                               Physical Exam & Imaging   BP 130/54 (BP Location: Right arm, Patient Position: Lying)   Pulse 80   Temp 36.7 C (98.1 F) (Axillary)   Resp 16   Ht  162.6 cm (5\' 4" )   Wt (!) 44.6 kg (98 lb 5.2 oz)   SpO2 94%   BMI 16.88 kg/m     General: thin, malnourished. alert, oriented in no acute distress  Neuro: Alert and Oriented and No local findings  HEENT: NC/AT, moist mucous membranes. NGT in place  CVS: RRR on monitor. Extremities warm and well perfused   Resp: no increased work of breathing. Symmetric chest expansion.   GI: Soft, non-distended, nontender. No prior surgical scars appreciated  Renal: No foley, voiding spontaneously   Extremities: No swelling, edema or gross abnormalities     Imaging:  Results for orders placed or performed during the hospital encounter of 09/27/20 (from the past 720 hour(s))   XR Chest/Upper Abdomen NG Tube Plcmnt    Narrative    EXAMINATION: ABDOMINAL RADIOGRAPH 09/27/2020 7:06 PM    ACCESSION NUMBER: [id number]    INDICATION: HN cancer. New DHT.    COMPARISON: Head CT 09/11/2020    TECHNIQUE: Anteroposterior supine radiograph of the upper abdomen was obtained.    FINDINGS:     Nasoenteric feeding catheter tip overlies the stomach. Tracheostomy tube  overlies the midthoracic trachea. Surgical drains and postsurgical changes over  the bilateral neck. Nonobstructive bowel gas pattern. No acute osseous  abnormality.     Impression    IMPRESSION:    Nasoenteric feeding catheter tip overlies the stomach.    Dictated by: Jacki Cones, MD. 09/27/2020 10:30 PM    I, Thornton Dales, MD, have reviewed the study and agree with the findings in  this report.  09/28/2020 6:38 AM   Fluoro Video Swallow W Speech    Narrative    EXAMINATION: MODIFIED BARIUM SWALLOW STUDY (MBSS)  10/04/2020 10:21 AM    ACCESSION NUMBER: [id number]    INDICATION: POD 7 from oral cavity cancer resection with free flap recon;  enterally fed via DHT. Evaluate swallowing function.    COMPARISON: CT head 09/11/2020 and CT neck 05/03/2020    TECHNIQUE:  Fluoroscopic  assistance was provided to speech pathology for  evaluation of swallowing during a video fluoroscopic swallow study. The patient  was given thin barium sulfate suspension to swallow under fluoroscopic  visualization.     FLUORO TIME: 1.0 minutes; FLUORO DAP: 40.1 microGy*m^2     FINDINGS:     Flexible catheter was used to gain access orally past the surgical site and  thin barium was slowly injected after which the patient was instructed to  swallow. The patient was able to swallow one time without any evidence of  intratracheal aspiration or laryngeal penetration after multiple attempts.     Impression    IMPRESSION:    1. No intratracheal aspiration or laryngeal penetration with thin barium on the  one swallow, following multiple attempts.     2. Please see speech pathology report for full details.    Dictated by: Bess Harvest, MD. 10/04/2020 11:34 AM    I, Marni Griffon, MD, have reviewed the study and agree with the findings in  this report.  10/04/2020 12:23 PM   XR Chest AP Portable    Narrative    EXAMINATION: XR CHEST AP PORTABLE 10/05/2020 9:00 AM    ACCESSION NUMBER: [id number]    INDICATION: recent flap surg short of breath    COMPARISON: PET/CT 09/11/2020, chest upper abdomen radiograph 09/27/2020    TECHNIQUE: Single frontal view of the chest.    FINDINGS: Tracheostomy tube overlies the midthoracic trachea. Surgical drains  overlie the neck base.  Enteric tube courses below the diaphragm with tip  excluded by collimation. Normal cardiac silhouette. Normal mediastinum.  Cephalization of pulmonary vasculature and mild diffuse interstitial  opacification. Right middle lobe scar. Right basilar airspace opacification. No  pleural effusion or  pneumothorax. No acute osseous abnormalities.     Impression    IMPRESSION:   Mild interstitial edema and pulmonary vascular redistribution which could be  seen with increased right heart pressures.  Right basilar airspace opacification could represent  infection or aspiration.    Findings were discussed with Dr. Welton Flakes by Dara Hoyer, MD on 10/05/2020 9:48  AM.     Dictated by: Dara Hoyer, MD. 10/05/2020 9:53 AM    I, Larose Kells, MD, have reviewed the study and agree with the findings in  this report.  10/05/2020 10:21 AM   XR Abdomen AP    Narrative    EXAMINATION: ABDOMINAL RADIOGRAPH 10/05/2020 9:51 AM    ACCESSION NUMBER: [id number]    INDICATION: recent flap surg. abd pain.    COMPARISON: Abdominal radiograph 09/27/2020    TECHNIQUE: AP supine radiograph of the abdomen was obtained on 2 cassettes.    FINDINGS:     The bowel gas pattern is nonobstructive. Weighted feeding catheter tube tip  within the stomach. The bony skeleton demonstrates no acute abnormalities.  Surgical clips within the pelvis     Impression    IMPRESSION:    No evidence of bowel obstruction.    Findings were discussed with Dr. Erenest Blank by Johnn Hai, MD on 10/05/2020 9:50  AM.    Dictated by: Johnn Hai, MD. 10/05/2020 9:59 AM    I, Thornton Dales, MD, have reviewed the study and agree with the findings in  this report.  10/05/2020 10:38 AM   CT Chest Pulmonary Embolism W Contrast    Narrative    EXAMINATION: CT CHEST PULMONARY EMBOLISM W CONTRAST 10/05/2020 12:13 PM    ACCESSION NUMBER: [id number]    INDICATION: Tachycardia, AMS, CXR with cephalization of pulmonary vessels PE  suspected, low prob, unknown D-dimer    COMPARISON: 09/11/2020 PET CT     TECHNIQUE: Helical CT scan from the lung apices through the lung bases with IV  contrast material, timed to the pulmonary arterial phase. Axial, coronal and  sagittal reconstructions obtained.    FINDINGS:      Cardiovascular: No evidence of pulmonary embolism. Pulmonary arteries  are  normal in caliber. Heart is normal in size. Marked thickening of the mitral  valve leaflets with evidence of prolapse as well as calcifications.  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 No evidence of right heart  strain. Pericardium unremarkable.  Other great vessels are unremarkable.    Proximal left subclavian artery mild narrowing.    Lungs and airways: Tracheostomy tube terminates in mid trachea. Emphysema.  Biapical pleuroparenchymal scarring. Middle lobe subsegmental atelectasis.  Right lower lobe consolidation and scattered areas of mucoid impaction, likely  aspiration pneumonia. A few small pulmonary nodules measuring up to 8 mm (axial  107). New left upper lobe nodule measures 6 mm (axial 134).     Diffuse bronchial wall thickening with scattered mucoid impaction.    Mediastinum and lymph nodes: No mediastinal mass. No lymphadenopathy.    Pleura: Small right pleural effusion.    Soft tissues and bones: Chest wall soft tissues unremarkable. No suspicious  lytic or blastic lesions.    Upper abdomen: Small ascites.       Impression    IMPRESSION:  No pulmonary embolism.    Right lower lobe consolidation and scattered areas of mucoid impaction, likely  aspiration pneumonia.    A few small pulmonary nodules measuring up to 8 mm. New left upper lobe nodule  measures 6 mm. The nodules are concerning for metastases.    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 Consider echocardiography for further evaluation.    Dictated by: Dellia Nims, MD,PhD. 10/05/2020 12:38 PM    I, Antony Blackbird, MD,PhD, have reviewed the study and agree with the findings in  this report.  10/05/2020 12:42 PM   XR Chest/Upper Abdomen NG Tube Plcmnt    Narrative    EXAMINATION: ABDOMINAL RADIOGRAPH 10/06/2020 12:47 PM    ACCESSION NUMBER: [id number]    INDICATION: Previous DHT removed, need to confirm placement of new one.  Placement of DHT.    COMPARISON:  10/05/2020 and CT PE 10/05/2020 TECHNIQUE: Anteroposterior supine  radiograph of the upper abdomen was obtained.    FINDINGS:     Weighted feeding catheter tip overlies the stomach. Nonobstructive bowel gas  pattern. No acute osseous abnormality. Surgical clips within the pelvis. Right  basilar airspace disease again seen.     Impression    IMPRESSION:    Weighted feeding catheter tip overlies the stomach.    Findings were discussed with Dr. Erenest Blank by Bess Harvest, MD on 10/06/2020 1:22  PM.    Dictated by: Bess Harvest, MD. 10/06/2020 1:24 PM    I, Ernestina Patches, MD, have reviewed the study and agree with the findings in this  report.  10/06/2020 1:32 PM   Fluoro Video Swallow W Speech    Narrative    EXAMINATION: MODIFIED BARIUM SWALLOW STUDY (MBSS) 10/11/2020 8:30 AM    ACCESSION NUMBER: [id number]    INDICATION: POD 12 from oral cavity cancer resection with free flap. Poor  swallow function on MBSS on 10/27, please reassess. Assess swallow.    COMPARISON: Modified barium swallow study 10/04/2020    TECHNIQUE:  Fluoroscopic  assistance was provided to speech pathology for  evaluation of swallowing during a video fluoroscopic swallow study. The patient  was given various consistencies of barium sulfate suspension to swallow under  fluoroscopic visualization.     FLUORO TIME: 1.8 minutes; FLUORO DAP: 64.6 microGy*m^2     FINDINGS:     Flexible catheter was used to gain access orally passes surgical site and thin  and nectar barium consistencies were slowly injected after which the patient  was instructed swallow. Laryngeal penetration with barium of thin consistency.  No intratracheal aspiration with any tested barium consistencies. Surgical  clips, nasogastric feeding tube and plate and screw fixation of the mandible  overlie the images.    Impression    IMPRESSION:    1. Laryngeal penetration with thin barium consistency.    2. Please see speech pathology report for full details.    Dictated by: Bess Harvest,  MD. 10/11/2020 9:58 AM    I, Ernestina Patches, MD, have reviewed the study and agree with the findings in this  report.  10/11/2020 12:01 PM   XR Chest/Upper Abdomen NG Tube Plcmnt    Narrative    EXAMINATION: ABDOMINAL RADIOGRAPH 10/10/2020 12:18 PM    ACCESSION NUMBER: [id number]    INDICATION: DHT placement. DHT placement.    COMPARISON: 10/06/2020 and PET/CT 09/11/2020    TECHNIQUE: Anteroposterior supine radiograph of the upper abdomen was obtained.    FINDINGS:  Dobbhoff tube tip overlies the stomach. Surgical clips overlie the pelvis.  Nonobstructive bowel gas pattern. Unchanged osseous structures. Right basilar  airspace disease again seen.     Impression    IMPRESSION:    Dobbhoff tube tip overlies the stomach.    Dictated by: Bess Harvest, MD. 10/10/2020 2:37 PM    I, Thornton Dales, MD, have reviewed the study and agree with the findings in  this report.  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    DOSE: 9.48 mCi F-18 fluorodeoxyglucose (FDG) IV.    TECHNIQUE: The patient was imaged on a Siemens Biograph MCT Flow whole body  PET/CT scanner. The patients fasting blood glucose level was 9.48 mg/dL.  The patient was positioned in the PET/CT scanner approximately 62 minutes after  injection of the radiopharmaceutical. A  non- contrasted CT scan was acquired  from the vertex through mid thighs. A 3D emission scan of the same areas was  acquired. PET emission scan was also fused with reconstructions of the head and  neck. Images were reviewed in the transaxial, coronal, and sagittal planes.    PET-CT FINDINGS:    HEAD AND NECK: Ill-defined hypermetabolic disease centered over the anterior  floor of mouth/oral  tongue, max SUV 26.5.    Scattered hypermetabolic lymphadenopathy most notable within the right neck,  for example within the right level 2 stations on images 104 and 106 fused, and  right supraclavicular region on image 136 fused, very suspicious for disease  involvement. Index hypermetabolic right level 2 cervical lymph node has max SUV  of 19.8 and measures approximately 0.9 cm on image 102.     Less prominent left cervical lymph nodes with mild associated activity are  noted for example within the left level 2 station on image 104 fused and level  3 station on image 119 fused.    THORAX: A few small pulmonary nodules with mild associated activity for example  an index right upper lobe pulmonary nodule with max SUV of 1.5 measuring  approximately 0.8 cm on image 124, not significantly changed in size from prior  CT neck 05/03/2020. Other small pulmonary nodules with mild associated activity  are noted within the right upper lung on images 120 and 129  fused and left  upper lobe on image 116 fused.    ABDOMEN AND PELVIS: Unremarkable.    MUSCULOSKELETAL: Unremarkable.    ADDITIONAL CT FINDINGS:     HEAD AND NECK: Mild mucosal thickening within the right maxillary sinus.  Paranasal sinuses and mastoid air cells are unremarkable. No acute intracranial  abnormalities. Thyroid and salivary glands are unremarkable.    THORAX: No pericardial or pleural effusion. Chronic emphysematous changes  within the lungs. Pleural thickening/nodularity within the right apex with  low-level of activity.    ABDOMEN AND PELVIS: Liver, spleen, pancreas, gallbladder, and adrenal glands  are unremarkable. Excreted contrast within the urinary collecting system. No  hydronephrosis. Asymmetric smaller size of the left kidney. Urinary bladder is  unremarkable. Prostate is unremarkable. Bowel is unremarkable. Atherosclerotic  vascular calcifications. No abnormally enlarged retroperitoneal or pelvic  lymphadenopathy. Surgical changes within  the  anterior pelvic wall.    MUSCULOSKELETAL: No aggressive bony lesions.    IMPRESSION:    Hypermetabolic disease within the anterior floor of mouth/oral tongue  compatible with biopsy-proven neoplasm and with associated hypermetabolic  lymphadenopathy within the neck concerning for metastatic involvement.    A few small scattered pulmonary nodules with mild associated activity which are  nonspecific, warrant attention on close follow-up imaging for further  characterization.    Dictated by: Okey Dupre, MD. 09/12/2020 8:25 AM    I, Susy Manor, MD, have reviewed the study and agree with the findings in  this report.  09/12/2020 11:15 AM                                                  Assessment & Recommendations     Patient Active Problem List   Diagnosis   ? Cancer of oral cavity   ? Hypovolemic shock   ? Chronic obstructive pulmonary disease   ? Acute blood loss anemia   ? Hypotension after procedure   ? On enteral nutrition   ? Hypokalemia   ? Hypomagnesemia   ? Tobacco abuse   ? C. difficile colitis   ? Primary hypertension   ? E coli bacteremia     Assessment:  69 y.o. male with PMH of anterior OC SCCa who is 14 Days Post-Op s/p resection and free flap reconstruction. Per nutrition and primary team, patient will require long term EN given poor PO intake. Plan for PEG placement in the OR tomorrow for longterm enteral access.     Recommendations:  - OR tomorrow for PEG placement  - Please make patient NPO MN and make sure COVID is up to date   - Recommendations were communicated verbally to the consulting team.  - General Surgery will continue to follow along as a consult, please call with questions or concerns.     Patient care discussed with Dr. Shelton Silvas,  [employer]  Pager #: [phone]  10/11/20  7:16 PM      Electronically signed by Gwendolyn Grant. Milbert Coulter, MD at 10/11/2020  9:52 PM EDT

## 2020-10-11 NOTE — Progress Notes (Signed)
Formatting of this note is different from the original.  Physical Therapy Inpatient Progress note  [facility]    Patient Name: Caleb Robinson    Age: 69 y.o.    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.9]  Treatment Diagnosis: Generalized deconditioning and acute post-op pain    Total Treatment Time:  31  Timed Codes:  31    Subjective     Subjective: Agreeable to PT; decannulated today   Patient's Goals: to return home    Pain:  Pre-treatment: other (see comments)  Post-treatment: other (see comments)  Comment: none stated     Precautions: Drain, Fall risk, Incision / suture site, IV, NPO and NGT  Left LE:  WBAT    Barriers to Learning: No known limitations    Objective     Cognitive Status  Orientation: Oriented x4  Level of Consciousness: Cooperative  Follows Commands: 100% of the time  Safety and Judgment: Intact    Treatment Performed:    Bed Mobility     -Supine to sit: Mod I  -Sit to supine: Mod I    Transfers  -Sit to stand: S, verbal cues for hand placement including toilet transfer  -Bed to chair: NT    Balance: Good standing balance  Gait: With R cowboy boot donned and CAM boot donned on L, amb x 210 S using FWW with satisfactory mechanics of gait observed  Stairs: NT    Therex: L LE fib flap HEB, ND HEP   Education: PT POC, d/c planning    Pt left in supine c all needs met, call bell in reach, RN aware, bed alarm intact and audible.    [facility] Inpatient Basic Mobility Short Form  How much difficulty does the patient currently have...  1. Turning over  in bed (including adjusting bedclothes, sheets and blankets)? A Little  2. Sitting down on and standing up from a chair with arms (e.g., wheelchair, bedside commode, etc.) A Little  3. Moving from lying on back to sitting on the side of the bed? A Little    How much help from another person does the patient currently need...  4. Moving to and from a bed to a chair (including a wheelchair)? A Little  5. Need to walk in hospital room? A Little  6. Climbing 3-5 steps with a railing? A Little    Raw Score: 18  T-score: 43.63      Assessment     Assessment: Increase gait distance with rx this day.  Educated with performing there ex and ND protocol to augment gains. . Will continue to follow.     Recommendations:  Discharge Disposition: home with assist, home with home health  Equipment Needed: front wheeled walker    Goals     Short Term Goals:  Pt will be I with all levels of bed mobility by 10/31 MET  Pt will go from supine in bed to OOB with STS I by 10/22 MET  Pt will ambulate 100' with SPV by 10/31 MET upgrade to 200' Mod I    Long Term Goal  Pt will ambulate 500' independently by 11/10    Plan     Frequency: 2-4 times per week     Interventions: Gait training therapy - 97116, Therapeutic activities - 97530    Therapist: Richard Miu, PTA  Pager ID: [phone]      Electronically signed by Richard Miu, PTA at 10/11/2020  2:12 PM EDT

## 2020-10-11 NOTE — Progress Notes (Signed)
Formatting of this note is different from the original.  Nutrition  [facility]ult [facility]    Patient Name: Caleb Robinson  Age: 69 y.o.  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 Patient reports good tolerance of TF.  Unable to fully assess for % EN delivery (goal >80%) over the past week d/t likely incomplete documentation. It appears that patient has met goal for at least 3 of the past 7 days. No report of insufficient intakes at this time. No reported N V C D or pain.      Pain affecting PO intake:?none reported     Current Nutrition Order:   NPO diet OTHER (COMMENTS)  Nutren 1.5    Food Allergies/Intolerances: NKFA    Client History/Diagnoses  Pt is a 69 y.o.male  with PMH of anterior OC SCCa, active smoker with COPD who is 14 Days Post-Op s/p 69yo M w/  now s/p Comp OC w/ segmental mandibulectmy, partial gloss, b/l ND, trach, L fibula free flap, STSG. Vessels: L Facial A, retromandibular V. MBSS suggested inefficient swallowing; plan for d/c with DHT. Labs and meds reviewed.    Past Medical History  History reviewed. No pertinent past medical history.    Surgical History  Past Surgical History:   Procedure Laterality Date   ? PR BONE-SKIN GRAFT, MICROVASCULAR N/A 09/27/2020    Procedure: FREE FLAP, OSTEOCUTANEOUS/ NOT ILIAC, METATARSAL OR GREAT TOE;  Surgeon: Roney Marion, MD;  Location: [facility];  Service: Otolaryngology   ? PR EXCIS TONGUE,MOUTH,JAW,RAD NECK Bilateral 09/27/2020    Procedure: COMPOSITE GLOSSECTOMY WITH RADICAL NECK DISSECTION;  Surgeon: Jerrye Beavers Day, MD;  Location: [facility];  Service: Otolaryngology   ? PR EXPLORATION, CAROTID  ARTERY  09/27/2020    Procedure: EXPLORATION W/WO LYSIS OF ARTERY; CAROTID ARTERY;  Surgeon: Roney Marion, MD;  Location: [facility];  Service: Otolaryngology   ? PR HYOID MYOTOMY & SUSPENSION Bilateral 09/27/2020    Procedure: HYOID MYOTOMY AND SUSPENSION;  Surgeon: Roney Marion, MD;  Location: [facility];  Service: Otolaryngology   ? PR RAD RESECT TUMOR SOFT TISS NECK/ANT THORAX 5+CM Right 09/27/2020    Procedure: RESECTION, RADICAL, NEOPLASM CHEST WALL 5 CM OR GREATER;  Surgeon: Jerrye Beavers Day, MD;  Location: [facility];  Service: Otolaryngology   ? PR RECONSTRUC MOUTH COMPLEX Bilateral 09/27/2020    Procedure: VESTIBULOPLASTY; COMPLEX (INCLUDING RIDGE EXTENSION, MUSCLE REPOSITIONING);  Surgeon: Roney Marion, MD;  Location: [facility];  Service: Otolaryngology   ? PR SPLIT GRFT TRUNK,ARM,LEG <100 SQCM Left 09/27/2020    Procedure: SPLIT-THICKNESS AUTOGRAFT, TRUNK, ARMS, LEGS; FIRST 100 SQ CM OR LESS, OR 1% BODY AREA INFANTS/CHILDREN;  Surgeon: Roney Marion, MD;  Location: [facility];  Service: Otolaryngology   ? PR TRACHEOSTOMY, PLANNED N/A 09/27/2020    Procedure: PERCUTANEOUS TRACHEOSTOMY;  Surgeon: Jerrye Beavers Day, MD;  Location: [facility];  Service: Otolaryngology     I/O:  I/O last 3 completed shifts:  In: 1480 [Other:480; NG/GT:1000]  Out: 30 [Other:30]    Medications/Vitamins/Herbals/Supplements  Scheduled Meds:  ? acetaminophen  650 mg Per OG/NG Tube 4 times per day   ?  aspirin  325 mg Per OG/NG Tube Daily   ? banana flakes-t-galactooligos.  10.75 g Per OG/NG Tube TID   ? budesonide-formoterol  2 puff Inhalation BID   ? cefTRIAXone  2 g Intravenous Q24H   ? celecoxib  200 mg Oral BID   ? enoxaparin  40 mg Subcutaneous Q24H   ? famotidine  20 mg Oral BID   ? gabapentin  300 mg Per OG/NG Tube 3 times per day   ? ipratropium-albuterol  3 mL Inhalation Q6H [place]   ? melatonin  5 mg Oral QHS   ? mirtazapine  15 mg Per OG/NG Tube QHS   ? nicotine  1 patch Transdermal Daily   ? prenatal  vitamin  1 tablet Per OG/NG Tube Daily   ? protein supplement  30 mL Per OG/NG Tube Daily   ? rosuvastatin  10 mg Oral Daily     Continuous Infusions:  PRN Meds:.docusate sodium, hydrocortisone, HYDROmorphone, ipratropium-albuterol, ondansetron, oxycodone, pantoprazole, promethazine, sennosides    Labs  Lab Results   Component Value Date    NA 139.0 10/11/2020    K 4.5 10/11/2020    CL 103 10/11/2020    GLUCOSE 94.0 10/11/2020    BUN 26 10/11/2020    CREATININE 0.9 10/11/2020    CALCIUM 8.7 10/11/2020    MG 2.2 10/11/2020    PHOSPHORUBLD 4.6 10/11/2020    HGBA1C 4.7 09/29/2020    ALT 63 (H) 10/05/2020    AST  10/05/2020      Comment:      No Result    WBC 14.31 (H) 10/11/2020     No results for input(s): [place] in the last 72 hours.    Nutrition Focused Physical Exam  General: No acute distress, resting in bed; +DHT  Edema:  none  Skin:  incision(s) WDL - leg, bilateral neck; flap  GI: Abdominal Appearance: nondistended; soft/nontender  Last BM: 11/3  Stool Consistency: liquid    Anthropometric Measurements  Wt Readings from Last 5 Encounters:   10/11/20 (!) 44.6 kg (98 lb 5.2 oz)   09/12/20 (!) 42.9 kg (94 lb 9.6 oz)   08/28/20 45.9 kg (101 lb 4.8 oz)   07/13/20 (!) 43.4 kg (95 lb 11.2 oz)     Estimated body mass index is 16.88 kg/m as calculated from the following:    Height as of this encounter: 162.6 cm (5' 4" ).    Weight as of this encounter: 44.6 kg (98 lb 5.2 oz).  Admit wt: 43.1 kg  UBW: 110# / 50 kg  IBW: 130# / 59.1 kg +/-10%  Weight History: 14% body weight x 3 months    Estimated Nutritional Needs: Anabolism and Maintenance  Based on admission weight 43.1 kg  GOAL RANGE METHOD/FORMULA   Energy 1300-1500 kcals/day 30-35 kcal/kg   Protein 65-85 gm/day 1.5-2 gm/kg   Fluid 1300-1500 ml/day 1 ml/kcal or per provider     Diet Education Needs: discussed nutrition support, answered all questions; RD to provide HEN education prior to d/c as plan for d/c with DHT    Nutrition Interventions   Formula /  solution (ND-2.1.1)  Feeding tube flush (ND-2.1.4)   Initial/Brief Nutrition Education: HEN diet  Discharge and Transfer of Nutrition Care to New Setting or Provider    Enteral Nutrition   Access: DHT tube  Formula: Nutren 1.5  Goal Rate: 40 mL   Flush: 41m  Q4H  Protein Modular: Prosource 317mdaily   Provides:   1500 kcal / day  35 kcal / kg / day 169 gm CHO / day   80 gm Pro/ day 1.9 gm protein / kg / day 747 mL free water     Enteral Nutrition (Bolus/Home)  Access: DHT tube  Formula:  Nutren 1.5  Bolus Volume: 217m  Bolus Frequency: Q6H  Flush before and after each bolus: 659m Protein Modular: Prosource 304maily   Provides:   1560 kcal / day 36 kcal / kg / day 176 gm CHO / day   83 gm Pro/ day 1.9 gm protein / kg / day 764 mL free      Nutrition Goals   EN: tolerate at goal rate without any difficulties reported  Diet Instruction: verbalize understanding of HEN diet  Prevent weight loss, promote weight gain    Monitoring and Evaluation   Total energy intake (FH-1.1.1.1)   Weight (AD-1.1.2)   Weight change (AD-1.1.4)   Electrolyte and renal profile   (BD-1.2)  Overall appearance   (PD-1.1.1), Extremities, muscles and bones   (PD-1.1.4), Digestive system (mouth to rectum)   (PD-1.1.5) and Skin   (PD-1.1.8)    Nutrition Plan   1. Continue bolus EN regimen - 250m63mh + 30ml31mSource daily. Flush with 60 ml water before and after each bolus.  2. Patient likely to d/c home on EN via DHT [employer] will not cover TF formula. Recommend the following OTC regimen - 4 cartons Boost Plus (or equivalent formula) daily + either 30 ml ProStat daily or 1 scoops of pure whey protein powder daily (assuming each scoop provides ~20g of protein).  3. RD to provide HEN and place OP RD referral for monitoring and surveillance of nutrition support prior to d/c.  4. Weekly weights.    RD will continue to monitor pt clinical course and will adjust nutrition interventions PRN.  Patient's plan of care discussed with member of the  care team and/or orders acknowledged by team in Epic.VictoriaKaty Maryella Shivers RDN, LD  Pager ID: [phone] signed by KatelAlvino Bloodat 10/11/2020 12:56 PM EDT

## 2020-10-11 NOTE — Nursing Note (Signed)
Formatting of this note might be different from the original.  Page to MD  Pt c/o new onset stabbing pain near skin graft site. Pain meds administered.   Electronically signed by Darden Amber, RN at 10/11/2020  2:04 PM EDT

## 2020-10-12 NOTE — Unmapped (Signed)
Formatting of this note might be different from the original.  Operative Note    Caleb Robinson  DOB: 09-Dec-1951  MRN: [id number]    Attending Surgeon: Neldon Mc, M.D.  Resident(s): Anabel Halon, M.D.    Date Performed: 10/12/2020    Procedure Performed:     Procedure:    EGD WITH PERCUTANEOUS PEG PLACEMENT  CPT(R) Code:  95188 - PR EGD PERCUTANEOUS PLACEMENT GASTROSTOMY TUBE    Pre-Operative Diagnosis: Severe protein calorie malnutrition  Post-Operative Diagnosis: Same    Anesthesia: general    Indication: 69 y.o. male with PMH of anterior OC SCCa who is 14 Days Post-Op s/p resection and free flap reconstruction. Per nutrition and ENT, patient will require long term EN given poor PO intake. Plan for PEG placement in the OR today for longterm enteral access.  Risks and benefits of surgery discussed with patient at length, including (but not limited to) infection, bleeding, injury to surrounding tissues/organs, recurrence, blood clot, respiratory or cardiac complications, potential need for additional/subsequent procedures. All questions were addressed and answered, and the patient wishes to proceed. Informed consent obtained.    Procedure Details: Pre-operative consent was obtained and documented in the record. The patient was brought to the operating room and placed in supine position. A surgical timeout was performed per operating room regulations.   An EGD scope was inserted in the oropharynx and passed down the esophagus into the stomach under direct luminal visualization.  The stomach was insufflated and all room lights were turned off.  One to one finger palpation to gastric mucosa invagination was appreciated in the left upper quadrant via the endoscope.  Transillumination was also appreciated at the sight of palpation.  The overlying skin was prepped and draped in the usual sterile fashion.  A transverse 1 cm incision was made in the LUQ and the introducer needle catheter was inserted through the  abdominal wall. Direct visualization of the catheter and wire were confirmed by intraluminal endoscope.  The gastrostomy tube was then passed orally and secured against the anterior stomach wall. The scope was passed distally to examine the pylorus, which was unremarkable.   The PEG tube was secured at 2 cm at the level of the skin. The PEG tube was noted to freely spin within the stomach lumen. The stomach was desufflated and the esophagus was examined upon finishing the procedure. No injuries to the esophagus were noted.   The dobhoff tube was removed.  At the end of the procedure all counts were correct.  The patient was extubated and transferred to the PACU in stable condition.   Dr. Ophelia Shoulder was present for the entirety of the procedure.     Drains: PEG    Implants:  * No implants in log *    Complications: None    EBL: Minimal    Findings: PEG in place at 2cm from the skin    Specimens: * No specimens in log *    Anabel Halon, MD  PGY4, Dept. of Surgery  Pager: [phone]  2:59 PM  10/12/2020    I stephen fann was present for entire procedure       Electronically signed by Bebe Shaggy, MD at 10/13/2020  2:30 PM EDT

## 2020-10-12 NOTE — Progress Notes (Signed)
Formatting of this note might be different from the original.    Problem: Adult Inpatient Plan of Care  Goal: Plan of Care Review  Outcome: Ongoing, Progressing  Goal: Patient-Specific Goal (Individualization)  Outcome: Ongoing, Progressing  Goal: Absence of Hospital-Acquired Illness or Injury  Outcome: Ongoing, Progressing  Goal: Optimal Comfort and Wellbeing  Outcome: Ongoing, Progressing  Goal: Readiness for Transition of Care  Outcome: Ongoing, Progressing  Goal: Rounds/Family Conference  Outcome: Ongoing, Progressing    Problem: Infection  Goal: Infection Symptom Resolution  Outcome: Ongoing, Progressing    Problem: Communication Impairment (Artificial Airway)  Goal: Effective Communication  Outcome: Ongoing, Progressing    Problem: Device-Related Complication Risk (Artificial Airway)  Goal: Optimal Device Function  Outcome: Ongoing, Progressing    Problem: Skin and Tissue Injury (Artificial Airway)  Goal: Absence of Device-Related Skin or Tissue Injury  Outcome: Ongoing, Progressing    Electronically signed by Judie Grieve, RN at 10/12/2020 12:01 AM EDT

## 2020-10-12 NOTE — Progress Notes (Signed)
Formatting of this note might be different from the original.  For all Team C patient questions from 6am-5pm during the week please contact the Team C intern at [phone] or their pager. On weekdays between 5pm-6am and weekends please first attempt to contact the intern on call at [phone] or, secondarily, the junior ENT resident on call.    Otolaryngology--Head and Neck Surgery Progress Note    Patient Name: Caleb Robinson  Date: 10/12/2020  Admission Date: 09/27/2020  Service: Otolaryngology--Head and Neck Surgery    Subjective:   AFVSS. On RA. MBSS completed. Did not aspirate but swallowing inefficiently. Unable to meet nutrition PO per SLP, not highly motivated and already underweight. Recommending PEG, scheduled with GS on 11/4. NPO MN and TF held. LVX ok per GS. PO vanc for C. Diff completed.    Objective:  General: NAD  HEENT: supple neck with midline trachea, surgical incision sites c/d/i, JP in place to bulb suction with ss output. Intraoral flap with some epidermolysis but good turgor, good cap refill and no evidence of congestion. Decannulated, some expected postoperative edema.  DHT in place  CV: pulses intact, regular rate  Resp: no increased work of breathing on TC  Ext: no cyanosis or swelling or rashes. Donor site under CAM boot, wound vac in place.    Neuro: EOMI    Assessment:   69 y.o. male with PMH of anterior OC SCCa, active smoker with COPD who is 15 Days Post-Op s/p 69yo M w/  now s/p Comp OC w/ segmental mandibulectmy, partial gloss, b/l ND, trach, L fibula free flap, STSG. Vessels: L Facial A, retromandibular V.     Neuro:   - Multimodal pain control    HEENT:   -RN flap checks q4h  - BID MD flap checks   - Vaseline ointment to incisions BID  - continuous pulse ox.   - Oral care    CV/Heme:  - Continue tele  - Notify MD if Systolic blood pressure > 170.   - Notify MD for sustained pulse > 110 while pain is well controlled  - ASA 325    Pulm:   - Wean O2 to keep sats >92%    FENGI:  - Replete  electrolytes to keep K >4.0, Mg>2.0  - Diet Nutren 1.5  NPO Diet Midnight   - Bolus tube feeds  - PEG today with GS    GU:  - Notify MD if UOP <30cc/h    ID:   - s/p unasyn course   - PO Vanc course for C. Diff through 11/15.  Will require PO vanc ppx for C. Diff every 3 days starting on 11/16 while on IV abx  - Zosyn for blood cx until 11/11 (can switch to Ceftriaxone pending sensitivities)  - blood growing E coli in 1 specimen and tracheal aspirate growing pasteurella.  - Probiotics    ENDO:   - SSI as appropriate    PPx:   - DVT: LVX 30 BID  - GI: H2     MSK:   - Will follow PT/OT     Dispo  - Floor  - Discharge this week    [name]  Resident Physician - PGY3  Otolaryngology - Head & Neck Surgery  Pager [phone]    Electronically signed by Nunzio Cobbs, MD at 10/12/2020  6:24 AM EDT

## 2020-10-12 NOTE — Unmapped (Signed)
Formatting of this note is different from the original.  Brief Operative Note    Patient:  Caleb Robinson MRN: [id number] Case: [id number]  Date of Birth: Apr 14, 1951 Age: 69 y.o.  Sex: male     Date: 10/12/2020    Pre-Op Diagnosis:  Oral phase dysphagia [R13.11]    Post-Op Diagnosis:  Post-Op Diagnosis Codes:     * Oral phase dysphagia [R13.11]     Providers/Role: Surgeon(s) and Role:     * Bebe Shaggy, MD - Primary   Circulator: Gillermo Murdoch, RN  Scrub Person: Cathren Laine, RN  Resident: Anabel Halon, MD    Procedure:   Procedure:    EGD WITH PERCUTANEOUS PEG PLACEMENT  CPT(R) Code:  27741 - PR EGD PERCUTANEOUS PLACEMENT GASTROSTOMY TUBE    Anesthesia: general     Estimated Blood Loss: minimal    Urine Output: * No values recorded between 10/12/2020  2:22 PM and 10/12/2020  2:38 PM *    Specimens: * No specimens in log *    Implants:   * No implants in log *     Drains and Retained Items (Surgical):  Enterostomy Tube 10/12/20 1427 PEG (percutaneous endoscopic gastrostomy) LUQ feeding (Active)     Naso/Oral Tube 10/10/20 1000 nasogastric right nostril (Active)   Securement other (see comments); taped to nostril center 10/12/20 0913   Tolerance no adverse signs/symptoms 10/12/20 0913   Clamp Status/Tolerance clamped; no emesis; no residual; no restlessness; no nausea 10/12/20 0913   Flush/Irrigation flushed with; water 10/12/20 0913   General Intake (mL) 100 10/12/20 0913   Tube Feeding Intake (mL) 250 10/11/20 1200     Surgeon Evaluation of Wound Class: IV: Dirty:  Old traumatic wounds, devitalized tissue or Existing infection or perforation or Organisms present BEFORE procedure    Complications: None    Findings: Dobhoff tube in place in stomach, PEG positioned at 2cm from skin    Expected Return to OR: No    Signed:  Anabel Halon, MD  PGY4, Dept. of Surgery  Pager: [phone]  2:55 PM  10/12/2020      Electronically signed by Bebe Shaggy, MD at 12/11/2020  1:59 AM EST

## 2020-10-12 NOTE — Progress Notes (Signed)
Formatting of this note might be different from the original.    Problem: Adult Inpatient Plan of Care  Goal: Plan of Care Review  Outcome: Ongoing, Progressing  Flowsheets  Taken 10/12/2020 1414  Progress: improving  Taken 10/12/2020 0913  Plan of Care Reviewed With: patient    Problem: Communication Impairment (Artificial Airway)  Goal: Effective Communication  Outcome: Ongoing, Progressing  Intervention: Ensure Effective Communication  Flowsheets (Taken 10/12/2020 0913)  Communication Enhancement Strategies:   call light answered in person   communication board used    Electronically signed by Vania Rea, RN at 10/12/2020  2:15 PM EDT

## 2020-10-12 NOTE — Progress Notes (Signed)
Formatting of this note is different from the original.  Surgery Post Op Check    Subjective:    Caleb Robinson is a 69 y.o. male POD#0 s/p PEG placement for severe protein calorie malnutrition.  The operation was without complications, the patient tolerated the procedure well and was brought to the floor in stable condition. Pain is being well controlled.  He denies any shortness of breath, chest pain, nausea, or vomiting.  He is tolerating small amounts of clears well.      Objective:    Temp: 37 C (98.6 F) Temp  Avg: 36.7 C (98.1 F)  Min: 36.5 C (97.7 F)  Max: 37 C (98.6 F)   Heart Rate: 77 Pulse  Avg: 71.8  Min: 67  Max: 77  BP: 117/51 BP  Min: 105/39  Max: 132/53    I/O this shift:  In: 100 [Other:100]  Out: -     Physical Exam:  General: Alert and oriented x3 in no acute distress  Pulmonary: Clear to auscultation bilaterally  Cardiovascular: Regular rate and rhythm with no murmurs or extra heart sounds heard.  Abdomen: Soft, nontender, nondistended. PEG in place on Lower left abdomen  Incision: Clean/dry/intact, no erythema or edema. No leaking around PEG.  Extremities: no peripheral edema    Recent Labs     10/10/20  0527 10/11/20  0212 10/12/20  0349   WBC 13.17* 14.31* 10.41   HGB 9.9* 8.3* 9.2*   HCT 29.6* 25.7* 28.6*   PLT 633* 657* 728*     Recent Labs     10/10/20  0527 10/11/20  0212 10/12/20  0349   NA 140.0 139.0 140.0   K 4.8 4.5 4.8   CL 105 103 106   BUN 21 26 21    CREATININE 0.8 0.9 0.8     Assessment and Plan    Caleb Robinson is a 69 y.o. male POD#0 s/p PEG placement for severe protein calorie malnutrition.  Stable, doing well.    Neuro: Pain well controlled.    CV: Monitor vitals  Resp: IS  FENGI: Nutren 1.5 diet.  Zofran PRN  Endocrine: no needs  Prophylaxis: SCDs  Heme/ID: Continue periop abx  Activity: OOBAT    Christianne Dolin MD PGY1  Pager ID: [phone]  Date: 10/12/2020  Time: 11:16 PM      Electronically signed by Christianne Dolin, MD at 10/12/2020 11:16 PM EDT

## 2020-10-12 NOTE — Progress Notes (Signed)
Formatting of this note is different from the original.  HOSPITALIST CONSULT PROGRESS NOTE    Patient: Caleb Robinson  Date of service: 10/12/2020  Date of admission:  09/27/2020  Service/attending: ENT-C / Nolberto Hanlon    Active Problems:  Patient Active Problem List   Diagnosis   ? Cancer of oral cavity   ? Hypovolemic shock   ? Chronic obstructive pulmonary disease   ? Acute blood loss anemia   ? Hypotension after procedure   ? On enteral nutrition   ? Hypokalemia   ? Hypomagnesemia   ? Tobacco abuse   ? C. difficile colitis   ? Primary hypertension   ? E coli bacteremia   ? Oral phase dysphagia     Subjective     Pt states he is "a little nervous" for his return to the OR today for PEG. Endorses feeling hungry. Denies cp, sob, abdominal pain or n/v.    Objective     Vitals:  Blood pressure 132/53, pulse 71, temperature 36.7 C (98.1 F), temperature source Axillary, resp. rate 18, height 162.6 cm (_0 ), weight (!) 44.6 kg (98 lb 5.2 oz), SpO2 95 %.    Physical Exam:  GENERAL: no acute distress.  EYES:  Extraocular movements are intact.  Sclerae nonicteric.  ENT: Postsurgical changes. DHT to right nare   NECK: decannulated and soft dressing to site. Incision intact   CARDIOVASCULAR: RRR. No murmurs, rubs, or gallops.  RESPIRATORY: Normal respiratory effort. Lungs clear bilaterally  GASTROINTESTINAL: Abdomen is soft, nontender, and nondistended with normal bowel sounds.  MUSCULOSKELETAL: No BLE edema   NEURO/PSYCH:  Calm and cooperative. Alert and oriented.  Motor and sensory function grossly intact and symmetrical in all four extremities.    PERTINENT LABS:  Lab Results   Component Value Date    WBC 10.41 10/12/2020    HGB 9.2 (L) 10/12/2020    HCT 28.6 (L) 10/12/2020    MCV 97.3 (H) 10/12/2020    PLT 728 (H) 10/12/2020    NEUTROABS 5.89 09/12/2020     Lab Results   Component Value Date    NA 140.0 10/12/2020    K 4.8 10/12/2020    CL 106 10/12/2020    CO2CT 29 10/12/2020    BUN 21 10/12/2020    CREATININE  0.8 10/12/2020    GLUCOSE 88.0 10/12/2020    CALCIUM 8.8 10/12/2020    MG 2.2 10/12/2020    AST  10/05/2020      Comment:      No Result    ALT 63 (H) 10/05/2020    ALKPHOS 222 (H) 10/05/2020    PROT 5.0 (L) 10/05/2020     Lab Results   Component Value Date    INR 1.24 (H) 09/27/2020     Lab Results   Component Value Date    TSH 1.88 10/09/2020    HGBA1C 4.7 09/29/2020    VITAMINB12 895 09/12/2020    FOLATE 14.2 09/12/2020     No results found for: GLUCOSEU, PROTEINUA, BILIRUBINUR, UROBILINOGEN, LABPH, BLOODU, KETONESU, NITRITE, LEUKOCYTESUR, LABSPEC, COLORU  Lab Results   Component Value Date    INR 1.24 (H) 09/27/2020     No results found for: GLUF  No results found for: PHENYTOIN, PHENOBARB, CBMZ, LITHIUM  No results found for: CHOL, TRIG, HDL, LDLCALC  No results found for: SEDIMENTATIO, CRPLOWSENSIT  No results for input(s): [place] in the last 72 hours.  No results for input(s): FINALREPORT, PRELIMINARYR in the last 72 hours.  RADIOLOGY:  Fluoro Video Swallow W Speech   Final Result   IMPRESSION:     1. Laryngeal penetration with thin barium consistency.     2. Please see speech pathology report for full details.     Dictated by: Bess Harvest, MD. 10/11/2020 9:58 AM     I, Ernestina Patches, MD, have reviewed the study and agree with the findings in this   report.  10/11/2020 12:01 PM     XR Chest/Upper Abdomen NG Tube Plcmnt   Final Result   IMPRESSION:     Dobbhoff tube tip overlies the stomach.     Dictated by: Bess Harvest, MD. 10/10/2020 2:37 PM     I, Thornton Dales, MD, have reviewed the study and agree with the findings in   this report.  10/10/2020 3:00 PM     XR Chest/Upper Abdomen NG Tube Plcmnt   Final Result   IMPRESSION:     Weighted feeding catheter tip overlies the stomach.     Findings were discussed with Dr. Erenest Blank by Bess Harvest, MD on 10/06/2020 1:22   PM.     Dictated by: Bess Harvest, MD. 10/06/2020 1:24 PM     I, Ernestina Patches, MD, have reviewed the study and agree with the findings  in this   report.  10/06/2020 1:32 PM     CT Chest Pulmonary Embolism W Contrast   Final Result   IMPRESSION:      No pulmonary embolism.     Right lower lobe consolidation and scattered areas of mucoid impaction, likely   aspiration pneumonia.     A few small pulmonary nodules measuring up to 8 mm. New left upper lobe nodule   measures 6 mm. The nodules are concerning for metastases.     Marked thickening of the mitral valve leaflets with evidence of prolapse as   well as calcifications. Consider echocardiography for further evaluation.     Dictated by: Dellia Nims, MD,PhD. 10/05/2020 12:38 PM     I, Antony Blackbird, MD,PhD, have reviewed the study and agree with the findings in   this report.  10/05/2020 12:42 PM     XR Chest AP Portable   Final Result   IMPRESSION:    Mild interstitial edema and pulmonary vascular redistribution which could be   seen with increased right heart pressures.   Right basilar airspace opacification could represent  infection or aspiration.     Findings were discussed with Dr. Welton Flakes by Dara Hoyer, MD on 10/05/2020 9:48   AM.      Dictated by: Dara Hoyer, MD. 10/05/2020 9:53 AM     I, Larose Kells, MD, have reviewed the study and agree with the findings in   this report.  10/05/2020 10:21 AM     XR Abdomen AP   Final Result   IMPRESSION:     No evidence of bowel obstruction.     Findings were discussed with Dr. Erenest Blank by Johnn Hai, MD on 10/05/2020 9:50   AM.     Dictated by: Johnn Hai, MD. 10/05/2020 9:59 AM     I, Thornton Dales, MD, have reviewed the study and agree with the findings in   this report.  10/05/2020 10:38 AM     Fluoro Video Swallow W Speech   Final Result   IMPRESSION:     1. No intratracheal aspiration or laryngeal penetration with thin barium on the   one swallow, following multiple attempts.      2. Please  see speech pathology report for full details.     Dictated by: Bess Harvest, MD. 10/04/2020 11:34 AM     I, Marni Griffon, MD, have reviewed the  study and agree with the findings in   this report.  10/04/2020 12:23 PM     XR Chest/Upper Abdomen NG Tube Plcmnt   Final Result   IMPRESSION:     Nasoenteric feeding catheter tip overlies the stomach.     Dictated by: Jacki Cones, MD. 09/27/2020 10:30 PM     I, Thornton Dales, MD, have reviewed the study and agree with the findings in   this report.  09/28/2020 6:38 AM     XR Chest AP Portable    (Results Pending)     MEDICATIONS:   Scheduled Meds:  ? acetaminophen  650 mg Per OG/NG Tube 4 times per day   ? aspirin  325 mg Per OG/NG Tube Daily   ? banana flakes-t-galactooligos.  10.75 g Per OG/NG Tube TID   ? budesonide-formoterol  2 puff Inhalation BID   ? cefTRIAXone  2 g Intravenous Q24H   ? celecoxib  200 mg Oral BID   ? enoxaparin  40 mg Subcutaneous Q24H   ? famotidine  20 mg Oral BID   ? gabapentin  300 mg Per OG/NG Tube 3 times per day   ? ipratropium-albuterol  3 mL Inhalation Q6H [place]   ? melatonin  5 mg Oral QHS   ? mirtazapine  15 mg Per OG/NG Tube QHS   ? nicotine  1 patch Transdermal Daily   ? prenatal vitamin  1 tablet Per OG/NG Tube Daily   ? protein supplement  30 mL Per OG/NG Tube Daily   ? rosuvastatin  10 mg Oral Daily     Continuous Infusions:    PRN Meds:.docusate sodium, hydrocortisone, HYDROmorphone, ipratropium-albuterol, ondansetron, oxycodone, pantoprazole, promethazine, sennosides    ASSESSMENT & PLAN:  Caleb Robinson is a 69 y.o. 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     Perioperative Management: Recommend early ambulation, PT/OT, RT for airway clearance. Wound mgt, pain control, rehab, and DVT PPX per primary team.     E.coli Bacteremia: blood cx drawn for leukocytosis and concern for sepsis and found to 1/2 E.coli(see MET note 10/28)  - ID following and will defer to their recommendations, plan for ceftriaxone for 2weeks     C  Diff Colitis: diarrhea resolved  - continue vancomycin 144m qid per peg (ID rec end date 11/5)  - following completion, will need to ppx vanc 1270monce q3 days while on IV abx for bacteremia     HTN:   - continue to hold amlodipine    COPD/Tobacco Use: no respiratory complaints   - continue symbicort BID and duonebs as needed  - continue nicotine patch 2135mstarted 10/23)  - visited with smoking cessation counselor, pt is encouraged to quit    HLD:  - continue rosuvastatin daily     Mood Disorder/Insomnia:  - continue mirtazapine qHS    Severe Malnutrition:   - continue RD to optimize nutrition, planned for PEG today    Ppx: Per primary team    Please call with questions or concerns.    LesRomualdo BolkP  Pager: [phone]   Electronically signed by LesPatricia PesaNP at 10/12/2020 12:03 PM EDT

## 2020-10-12 NOTE — Progress Notes (Signed)
Formatting of this note is different from the original.  Surgery Progress Note  [facility]    Patient Name: Caleb Robinson  Age: 69 y.o.  Sex: male  MRN: [id number]  Date: 10/12/2020  Admission Date: 09/27/2020  Post-Op Day: 15 Days Post-Op    Admission Diagnosis: <principal problem not specified>  Surgical Service: Otolaryngology  Surgeon(s):  Rexene Edison, MD  Jerrye Beavers Day, MD  Roney Marion, MD    Subjective     ID:  69 yo with oral cancer 15 Days Post-Op s/p resection and reconstruction    Overnight/24 hr Events:   NAEO. VSS. NPO for PEG today    Objective     Vital Signs:   Vitals:    10/11/20 2053 10/11/20 2332 10/12/20 0346 10/12/20 0411   BP:  118/45  132/51   BP Location:  Right arm  Right arm   Patient Position:  Lying  Lying   Pulse: 80 70 70 74   Resp: _0 Temp:  36.5 C (97.7 F)  36.9 C (98.4 F)   TempSrc:  Axillary  Axillary   SpO2: 94% 97% 95% 100%   Weight:       Height:         Temp (24hrs), Avg:36.6 C (97.8 F), Min:36.4 C (97.5 F), Max:36.9 C (98.4 F)    Ins and Outs:  11/03 0701 - 11/04 0700  In: 350   Out: 0     Physical Exam    General: thin, malnourished. alert, oriented in no acute distress  Neuro: Alert and Oriented and No local findings  HEENT: NC/AT, moist mucous membranes. NGT in place  CVS: RRR on monitor. Extremities warm and well perfused   Resp: no increased work of breathing. Symmetric chest expansion.   GI: Soft, non-distended, nontender. No prior surgical scars appreciated  Renal: No foley, voiding spontaneously   Extremities: No swelling, edema or gross abnormalities     Tubes, Lines & Drains  Naso/Oral Tube 10/10/20 1000 nasogastric right nostril (Active)   Securement other (see comments) 10/11/20 2007   Tolerance no adverse signs/symptoms 10/11/20 2007   Clamp Status/Tolerance clamped; no abdominal discomfort; no abdominal distention; no emesis; no nausea; no residual; no restlessness 10/11/20 2007   Flush/Irrigation  flushed with; water; no resistance met 10/10/20 2300   General Intake (mL) 100 10/11/20 1000   Tube Feeding Intake (mL) 250 10/11/20 1200         Recent Labs   Lab 10/10/20  0527 10/11/20  0212 10/12/20  0349   WBC 13.17* 14.31* 10.41   HGB 9.9* 8.3* 9.2*   HCT 29.6* 25.7* 28.6*   PLT 633* 657* 728*   NA 140.0 139.0 140.0   K 4.8 4.5 4.8   CL 105 103 106   CO2CT 30* 30* 29   BUN _1 CREATININE 0.8 0.9 0.8   GLUCOSE 81.0 94.0 88.0   CALCIUM 9.0 8.7 8.8   MG 2.2 2.2 2.2     Lab Results   Component Value Date    INR 1.24 (H) 09/27/2020    PTT 21.9 (L) 09/27/2020     Lab Results   Component Value Date    ALT 63 (H) 10/05/2020    AST  10/05/2020      Comment:      No Result    ALKPHOS 222 (H) 10/05/2020    BILITOT 0.3 10/05/2020  No results found for: CHOL, TRIG, HDL, LDLCALC  Lab Results   Component Value Date    TSH 1.88 10/09/2020    HGBA1C 4.7 09/29/2020    VITAMINB12 895 09/12/2020    FOLATE 14.2 09/12/2020     No results found for: GLUCOSEU, PROTEINUA, BILIRUBINUR, UROBILINOGEN, LABPH, BLOODU, KETONESU, NITRITE, LEUKOCYTESUR, LABSPEC, COLORU    Medications:  Continuous:    Scheduled:  ? acetaminophen  650 mg Per OG/NG Tube 4 times per day   ? aspirin  325 mg Per OG/NG Tube Daily   ? banana flakes-t-galactooligos.  10.75 g Per OG/NG Tube TID   ? budesonide-formoterol  2 puff Inhalation BID   ? cefTRIAXone  2 g Intravenous Q24H   ? celecoxib  200 mg Oral BID   ? enoxaparin  40 mg Subcutaneous Q24H   ? famotidine  20 mg Oral BID   ? gabapentin  300 mg Per OG/NG Tube 3 times per day   ? ipratropium-albuterol  3 mL Inhalation Q6H [place]   ? melatonin  5 mg Oral QHS   ? mirtazapine  15 mg Per OG/NG Tube QHS   ? nicotine  1 patch Transdermal Daily   ? prenatal vitamin  1 tablet Per OG/NG Tube Daily   ? protein supplement  30 mL Per OG/NG Tube Daily   ? rosuvastatin  10 mg Oral Daily     PRN:  docusate sodium, hydrocortisone, HYDROmorphone, ipratropium-albuterol, ondansetron, oxycodone, pantoprazole, promethazine,  sennosides    Radiology     Fluoro Video Swallow W Speech    Result Date: 10/11/2020  EXAMINATION: MODIFIED BARIUM SWALLOW STUDY (MBSS) 10/11/2020 8:30 AM ACCESSION NUMBER: [id number] INDICATION: POD 12 from oral cavity cancer resection with free flap. Poor swallow function on MBSS on 10/27, please reassess. Assess swallow. COMPARISON: Modified barium swallow study 10/04/2020 TECHNIQUE:  Fluoroscopic  assistance was provided to speech pathology for evaluation of swallowing during a video fluoroscopic swallow study. The patient was given various consistencies of barium sulfate suspension to swallow under fluoroscopic visualization. FLUORO TIME: 1.8 minutes; FLUORO DAP: 64.6 microGy*m^2 FINDINGS: Flexible catheter was used to gain access orally passes surgical site and thin and nectar barium consistencies were slowly injected after which the patient was instructed swallow. Laryngeal penetration with barium of thin consistency. No intratracheal aspiration with any tested barium consistencies. Surgical clips, nasogastric feeding tube and plate and screw fixation of the mandible overlie the images.    IMPRESSION: 1. Laryngeal penetration with thin barium consistency. 2. Please see speech pathology report for full details. Dictated by: Bess Harvest, MD. 10/11/2020 9:58 AM I, Ernestina Patches, MD, have reviewed the study and agree with the findings in this report.  10/11/2020 12:01 PM    XR Chest/Upper Abdomen NG Tube Plcmnt    Result Date: 10/10/2020  EXAMINATION: ABDOMINAL RADIOGRAPH 10/10/2020 12:18 PM ACCESSION NUMBER: [id number] INDICATION: DHT placement. DHT placement. COMPARISON: 10/06/2020 and PET/CT 09/11/2020 TECHNIQUE: Anteroposterior supine radiograph of the upper abdomen was obtained. FINDINGS: Dobbhoff tube tip overlies the stomach. Surgical clips overlie the pelvis. Nonobstructive bowel gas pattern. Unchanged osseous structures. Right basilar airspace disease again seen.     IMPRESSION: Dobbhoff tube tip overlies the  stomach. Dictated by: Bess Harvest, MD. 10/10/2020 2:37 PM I, Thornton Dales, MD, have reviewed the study and agree with the findings in this report.  10/10/2020 3:00 PM    Assessment & Plan     69 y.o.malewith PMH of anterior OC SCCa who is 14 Days Post-Op s/p resection and free flap  reconstruction. Per nutrition and primary team, patient will require long term EN given poor PO intake. Plan for PEG placement in the OR today for longterm enteral access.     - Plan for OR today, informed consent obtained  - NPO  - Covid negative    Pandora Leiter,  [facility] [place]  Pager #: [phone]  10/12/20  7:48 AM      Electronically signed by Bebe Shaggy, MD at 12/11/2020  1:59 AM EST

## 2020-10-13 NOTE — Progress Notes (Signed)
Formatting of this note might be different from the original.    Problem: Adult Inpatient Plan of Care  Goal: Plan of Care Review  Outcome: Ongoing, Progressing    Problem: Adult Inpatient Plan of Care  Goal: Plan of Care Review  Outcome: Ongoing, Progressing  Goal: Patient-Specific Goal (Individualization)  Outcome: Ongoing, Progressing  Goal: Absence of Hospital-Acquired Illness or Injury  Outcome: Ongoing, Progressing  Goal: Optimal Comfort and Wellbeing  Outcome: Ongoing, Progressing    Problem: Infection  Goal: Infection Symptom Resolution  Outcome: Ongoing, Progressing    Problem: Communication Impairment (Artificial Airway)  Goal: Effective Communication  Outcome: Ongoing, Progressing    Problem: Device-Related Complication Risk (Artificial Airway)  Goal: Optimal Device Function  Outcome: Ongoing, Progressing    Problem: Skin and Tissue Injury (Artificial Airway)  Goal: Absence of Device-Related Skin or Tissue Injury  Outcome: Ongoing, Progressing    Electronically signed by Adrian Saran, RN at 10/13/2020 11:28 PM EDT

## 2020-10-13 NOTE — Progress Notes (Signed)
Formatting of this note might be different from the original.    Problem: Adult Inpatient Plan of Care  Goal: Plan of Care Review  Outcome: Ongoing, Progressing  Goal: Patient-Specific Goal (Individualization)  Outcome: Ongoing, Progressing  Goal: Absence of Hospital-Acquired Illness or Injury  Outcome: Ongoing, Progressing  Goal: Optimal Comfort and Wellbeing  Outcome: Ongoing, Progressing  Goal: Readiness for Transition of Care  Outcome: Ongoing, Progressing  Goal: Rounds/Family Conference  Outcome: Ongoing, Progressing    Problem: Infection  Goal: Infection Symptom Resolution  Outcome: Ongoing, Progressing    Problem: Communication Impairment (Artificial Airway)  Goal: Effective Communication  Outcome: Ongoing, Progressing    Problem: Device-Related Complication Risk (Artificial Airway)  Goal: Optimal Device Function  Outcome: Ongoing, Progressing    Problem: Skin and Tissue Injury (Artificial Airway)  Goal: Absence of Device-Related Skin or Tissue Injury  Outcome: Ongoing, Progressing    Electronically signed by Judie Grieve, RN at 10/13/2020 12:12 AM EDT

## 2020-10-13 NOTE — Discharge Summary (Signed)
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  _______________________________________________________________    Pain  - For MILD to MODERATE pain, use over the counter Tylenol or Ibuprofen  as directed on the bottle.  - For SEVERE pain ONLY, use Oxycodone as prescribed. You are NOT required to take this medication if you do not need it. Please DO NOT drive if taking this medication.    Antibiotics   - You have been prescribed an IV antibiotic (Rocephin) for treatment of a blood stream infection. Home health infusion has been set up for you to receive this antibiotic upon discharge at home through your PICC line. The end date for this treatment is 10/20/2020.    - You have also been prescribed Vancomycin 125 mg to be taken for one dose by feeding tube. Please take one dose on November 9.    Anticoagulation  - Please continue taking Aspirin for the next 2 weeks. Take as prescribed and do not stop taking until you are instructed to do so.     Stool Softeners  - Some of your pain medications may cause constipation. Please use over the counter stool softeners such as Miralax, Colace, or Sennokot as needed.    Antacids  - Please take famotidine as prescribed for acid reflux control.      Oral Care  - Please continue to use Peridex mouthwash as prescribed at least twice a day until instructed to stop.     Incision and Wound  Care  _______________________________________________________________    1. Incisions: Your incisions should be cleaned twice a day with a mix of one-half saline and one-half peroxide for removing crusts. (IF NO CRUSTS, THEN NO PEROXIDE; use plain saline.) A thin coat of Vaseline may be applied after cleaning. It is okay to shower, but do not soak wounds. Softly pat your wounds dry. Please DO NOT rub wounds aggressively.     2. Oral Care: DO NOT SWALLOW. Gentle oral care should be done 3x per day with foam swabs.  Use Peridex mouthwash twice per day as prescribed. Rinse your mouth with a saline and baking soda mix when you wake up, after each meal and before bedtime.    Salt and Soda Mouthwash:   1 teaspoon of salt  1/2 teaspoon of baking soda  4-6 oz (about 1/2 cup) warm water  You can double this recipe and put it in a 20 oz bottle of water. Discard any un-used mouthwash after 3 days.    3. Leg dressing care. Please follow these instructions at least ONCE A DAY until told to stop.    Materials that you have been provided:     Vasaline Gauze = white sticky gauze in silver package  Kerlix = white fluffy long gauze wrap  Telfa = small rectangular shiny dressing    LEG: Please change dressing over leg daily:   1. Place a small amount of Vaseline on Vasaline Gauze.  2. Cover incisions with Telfa gauze.   3. Cover with Kerlex wrap.  4. Replace the brace gentlely over the Kerlex.     MOUTH: There is an area of exposed jaw bone (mandible) that needs care. Please apply calcium alginate provided to you to change at least twice daily    4. Care of Tracheotomy Stoma After Decannulation: Your tracheotomy tube was removed while you were in the hospital. The trach hole or stoma normally closes gradually on its own over the next one to two weeks. It is important to apply pressure over this hole when speaking or coughing to minimize air passing through the site. The less air that passes through the hole, the quicker it will close.  Please follow the instructions below to change the dressing at least once a day. Better trach stoma care results in quicker healing.     1. Take Vaseline gauze (white sticky gauze) and cut or fold it to fit over the trach hole. If you do not have Vaseline gauze, you can put some Vaseline on a dry piece of gauze.  2. Cover this Vaseline gauze with 2 to 3 pieces of dry gauze (4 inches x 4 inches).  3. Use tape to secure this dry gauze and hold everything in place.   4. Change at least once at day or more if needed.     Activity and Limitations  _______________________________________________________________     Frequent walking is encouraged and please resume normal day to day activity as tolerated.    DO NOT lift anything greater than 10 pounds.   DO NOT push or pull anything greater than 10 pounds.   DO NOT submerge incision in water (pools or baths). Shower and bath according to instructions above.   DO NOT do anything that requires excessive straining.   DO NOT drive, operate machinery, or make any important legal decisions while taking narcotic medication.   DO NOT apply pressure or tension to your incision.    Diet  _______________________________________________________________    DO NOT TAKE ANY TYPE  OF FOOD or DRINK or MEDICATION BY MOUTH UNTIL YOU RECEIVE FURTHER INSTRUCTION. This is important to help you heal properly and avoid complications. Please continue to take tube feeds as instructed.    The hospital dietician gave you specific instructions on how to manage tube feeds to meet daily caloric needs. Please follow the following instructions carefully. Please follow up with outpatient Registered Dietician 2-4 weeks following discharge.     Home tube feed regimen: PEG, bolus feeds              - 4 cartons of Nutren 1.5 daily, 1 cartons per bolus              -30 ml ProStat daily              -Water flushes: 60 ml-before and after feeds              -Above regimen provides 1560 kcal, 83 g pro      Referral will be placed for [place], Shirlean Mylar. Scheduling line provided to pt: [phone]. RD contact information provided.    Planned Follow-up Appointments  _____________________________________________________________    Please follow-up with your primary care provider within 2 weeks to resume medical care.     You will have an appointment to see [place] in ENT clinic next Friday (10/20/20) and Dr. Nolberto Hanlon in approximately 2 weeks. A referral has been sent on your behalf at discharge. Someone from the office will call you to schedule that appointment. Please call the scheduling office at [phone] if you have not heard back about your appointments a week after leaving the hospital. If you are being seen at the [facility], you may call [phone] for any issues.    PRECAUTIONS   _____________________________________________________________    If I have the following problems I should call my doctor or go to the closest ER immediately:   ? Fever > 101.5  ? Uncontrolled, increasing, or severe pain  ? Bleeding   ? Difficulty breathing or problems with your trach tube   ? New difficulty swallowing  ? Any other concerns related to your surgery.     IMPORTANT PHONE NUMBERS  _________________________________________________________    ? Medical concerns during normal business hours (8:30am-5pm M-F):     [phone]    ? Medical concerns after-hours or on the weekend. Call the [facility] hospital operator and ask for the ENT resident on call:     [phone]    ? Follow-up appointment scheduling office:                 [phone]        Electronically signed by Jerrye Beavers Day, MD at 10/17/2020 10:23 AM EST

## 2020-10-13 NOTE — Progress Notes (Signed)
Formatting of this note is different from the original.  [place]  Occupational Therapy Daily Note    Patient: Caleb Robinson   MRN: [id number]   DOB: [birth year]     Date of Service: 10/13/2020     Visit: 3  ROOM: D782/A    Location of Treatment: Bedside  Funding: Payor: [employer] MEDICARE COMPLETE /  /  /      Discipline Providing Care: OT    Anticipated discharge disposition: home with assist  Equipment Needed:      Precautions: IV and Tracheostomy    Barriers to Learning: No known limitations    Subjective     Subjective: "It took 4 hours to get this tattoo."     Pre-Treatment Pain Rating:other (see comments)  Post-Treatment Pain Rating:other (see comments)  Pre/Post Treatment Pain Comment: Pt reported no pain    Objective     Cognitive Status:   Orientation:  Oriented x4  Level of consciousness:  Cooperative and Alert  Follows commands and answers:  100% of the time  Personal safety and judgement:  Intact    Treatment Performed Today:     ADL Training:   - Pt washed hair by donning/doffing shower cap sitting EOB: mod I  - Pt donned deodorant sitting EOB: mod I  - Pt washed off using bath wipes sitting EOB: mod I  - Pt brushed & dried hair sitting EOB: mod I   - Pt donned socks sitting EOB utilizing figure 4 method: mod I    Functional Transfer Training:   - sit <> stand x2: spv  - sitting EOB 10+ minutes for grooming ADLs: spv    Patient Family Education: OT Role, OT POC    Pt left sitting EOB with all needs met, call bell in reach, RN aware, bed alarm intact.    [facility] Inpatient Daily Activity Short Form  How much help from another person does the patient current need...  Putting on and taking off regular lower body clothing? None  Bathing? None  Toileting, including toilet, bedpan, or urinal? None  Putting on and taking off regular upper body clothing? None  Taking care of personal grooming? None  Eating meals? Total    Raw Score: 21  T-Score: 44.27      Patients with a T-score a  39.4 or greater indicates increased accuracy in prediction of discharge to home.  Jette et al.     Untimed Codes: 0  Timed Codes: 24  Total treatment time: 24    Goals     Short Term Goals:   11/5, pt will demonstrate 3 standing grooming tasks SPV *Goal Met 11/5*  by 11/5, pt will demonstrate BSC tf SPV adhering to L LE TDWB precaution until 10/25 *Goal Met 11/5*  by 11/5, pt will demonstrate LB dressing SPV *Goal Met 10/06/20* *New Goal* By 11/12, Pt will don pants over hips in standing with mod I *Goal Met 11/5*          Long Term Goals:   by 11/19, pt will be mod I in morning ADL routine *Goal Met 11/5*        Assessment     Summary: Pt & RN agreeable to OT tx. Pt making great progress with therapy, meeting all goals. Pt performed functional transfers with spv & LBD with mod I compared to requiring increased assistance with previous sessions. From therapy standpoint, Pt is safe to discharge home with assist & is no longer requiring  therapy needs.     Plan     Therapy frequency: discharge  Therapeutic Interventions: Self care mngment training - [phone], Therapeutic exercises - 05397, Therapeutic activities - [place], OTS    I was present in the room and guided service delivery when the student was participating in the provision of services, and was not engaged in treating any other patients or any other task at the time of service delivery.  I am responsible for the plan of care and assessment for this patient.    Therapist: Dyke Maes, OTR/L  Pager: [phone]      Electronically signed by Dyke Maes, OT at 10/13/2020  3:55 PM EDT

## 2020-10-13 NOTE — Progress Notes (Signed)
Formatting of this note might be different from the original.  For all Team C patient questions from 6am-5pm during the week please contact the Team C intern at [phone] or their pager. On weekdays between 5pm-6am and weekends please first attempt to contact the intern on call at [phone] or, secondarily, the junior ENT resident on call.    Otolaryngology--Head and Neck Surgery Progress Note    Patient Name: Caleb Robinson  Date: 10/13/2020  Admission Date: 09/27/2020  Service: Otolaryngology--Head and Neck Surgery    Subjective:   AFVSS. On RA. Received PEG on 11/4, per GS hold off on tube feeding until 11/5    Objective:  General: NAD  HEENT: supple neck with midline trachea, surgical incision sites c/d/i, JP in place to bulb suction with ss output. Intraoral flap with some epidermolysis but good turgor, good cap refill and no evidence of congestion. Decannulated, some expected postoperative edema.  DHT in place  CV: pulses intact, regular rate  Resp: no increased work of breathing on TC  Ext: no cyanosis or swelling or rashes. Donor site under CAM boot, wound vac in place.    Neuro: EOMI    Assessment:   69 y.o. male with PMH of anterior OC SCCa, active smoker with COPD who is 1 Day Post-Op s/p 69yo M w/  now s/p Comp OC w/ segmental mandibulectmy, partial gloss, b/l ND, trach, L fibula free flap, STSG. Vessels: L Facial A, retromandibular V.     Neuro:   - Multimodal pain control    HEENT:   -RN flap checks q4h  - BID MD flap checks   - Vaseline ointment to incisions BID  - continuous pulse ox.   - Oral care    CV/Heme:  - Continue tele  - Notify MD if Systolic blood pressure > 170.   - Notify MD for sustained pulse > 110 while pain is well controlled  - ASA 325    Pulm:   - Wean O2 to keep sats >92%    FENGI:  - Replete electrolytes to keep K >4.0, Mg>2.0  - Diet Nutren 1.5  NPO Diet Midnight   - Start tube feeds through G-tube today    ID:   - s/p unasyn course   - PO Vanc course for C. Diff through 11/15.  Will  require PO vanc ppx for C. Diff every 3 days starting on 11/16 while on IV abx  - Zosyn for blood cx until 11/11 (can switch to Ceftriaxone pending sensitivities)  - blood growing E coli in 1 specimen and tracheal aspirate growing pasteurella.  - Probiotics    ENDO:   - SSI as appropriate    PPx:   - DVT: LVX 30 BID  - GI: H2     MSK:   - Will follow PT/OT     Dispo  - Floor  - Discharge this weekend    [name]  Resident Physician - PGY3  Otolaryngology - Head & Neck Surgery  Pager [phone]    Electronically signed by Nunzio Cobbs, MD at 10/13/2020  6:41 AM EDT

## 2020-10-13 NOTE — Progress Notes (Signed)
Formatting of this note is different from the original.  HOSPITALIST CONSULT PROGRESS NOTE    Patient: Caleb Robinson  Date of service: 10/13/2020  Date of admission:  09/27/2020  Service/attending: ENT-C / Nolberto Hanlon    Active Problems:  Patient Active Problem List   Diagnosis   ? Cancer of oral cavity   ? Hypovolemic shock   ? Chronic obstructive pulmonary disease   ? Acute blood loss anemia   ? Hypotension after procedure   ? On enteral nutrition   ? Hypokalemia   ? Hypomagnesemia   ? Tobacco abuse   ? C. difficile colitis   ? Primary hypertension   ? E coli bacteremia   ? Oral phase dysphagia     Subjective     Pt resting in bed and watching TV. He states feeling well today but is frustrated about complication with discharge planning. Endorses tenderness near new PEG site, worse with cough. Denies cp, sob, abdominal pain or n/v.    Objective     Vitals:  Blood pressure 124/63, pulse 69, temperature 36.6 C (97.9 F), temperature source Axillary, resp. rate 20, height 162.6 cm (5' 4" ), weight (!) 43.8 kg (96 lb 9 oz), SpO2 90 %.    Physical Exam:  GENERAL: no acute distress.  EYES:  Extraocular movements are intact.  Sclerae nonicteric.  ENT: Postsurgical changes, decannulated and soft dressing to site. Incision intact   CARDIOVASCULAR: RRR. No m/r/g  RESPIRATORY: Normal respiratory effort. Lungs clear bilaterally  GASTROINTESTINAL: Abdomen is soft, PEG site intact but tender  MUSCULOSKELETAL: No BLE edema   NEURO/PSYCH:  Pleasant and cooperative. Alert and oriented.  Motor and sensory function grossly intact and symmetrical in all four extremities.    PERTINENT LABS:  Lab Results   Component Value Date    WBC 10.93 (H) 10/13/2020    HGB 9.6 (L) 10/13/2020    HCT 29.2 (L) 10/13/2020    MCV 98.0 (H) 10/13/2020    PLT 857 (H) 10/13/2020    NEUTROABS 5.89 09/12/2020     Lab Results   Component Value Date    NA 141.0 10/13/2020    K 4.8 10/13/2020    CL 105 10/13/2020    CO2CT 27 10/13/2020    BUN 15 10/13/2020     CREATININE 0.9 10/13/2020    GLUCOSE 93.0 10/13/2020    CALCIUM 9.1 10/13/2020    MG 2.2 10/13/2020    AST  10/05/2020      Comment:      No Result    ALT 63 (H) 10/05/2020    ALKPHOS 222 (H) 10/05/2020    PROT 5.0 (L) 10/05/2020     Lab Results   Component Value Date    INR 1.24 (H) 09/27/2020     Lab Results   Component Value Date    TSH 1.88 10/09/2020    HGBA1C 4.7 09/29/2020    VITAMINB12 895 09/12/2020    FOLATE 14.2 09/12/2020     No results found for: GLUCOSEU, PROTEINUA, BILIRUBINUR, UROBILINOGEN, LABPH, BLOODU, KETONESU, NITRITE, LEUKOCYTESUR, LABSPEC, COLORU  Lab Results   Component Value Date    INR 1.24 (H) 09/27/2020     No results found for: GLUF  No results found for: PHENYTOIN, PHENOBARB, CBMZ, LITHIUM  No results found for: CHOL, TRIG, HDL, LDLCALC  No results found for: SEDIMENTATIO, CRPLOWSENSIT  No results for input(s): [place] in the last 72 hours.  No results for input(s): FINALREPORT, PRELIMINARYR in the last 72 hours.  RADIOLOGY:  Fluoro Video Swallow W Speech   Final Result   IMPRESSION:     1. Laryngeal penetration with thin barium consistency.     2. Please see speech pathology report for full details.     Dictated by: Bess Harvest, MD. 10/11/2020 9:58 AM     I, Ernestina Patches, MD, have reviewed the study and agree with the findings in this   report.  10/11/2020 12:01 PM     XR Chest/Upper Abdomen NG Tube Plcmnt   Final Result   IMPRESSION:     Dobbhoff tube tip overlies the stomach.     Dictated by: Bess Harvest, MD. 10/10/2020 2:37 PM     I, Thornton Dales, MD, have reviewed the study and agree with the findings in   this report.  10/10/2020 3:00 PM     XR Chest/Upper Abdomen NG Tube Plcmnt   Final Result   IMPRESSION:     Weighted feeding catheter tip overlies the stomach.     Findings were discussed with Dr. Erenest Blank by Bess Harvest, MD on 10/06/2020 1:22   PM.     Dictated by: Bess Harvest, MD. 10/06/2020 1:24 PM     I, Ernestina Patches, MD, have reviewed the study and agree  with the findings in this   report.  10/06/2020 1:32 PM     CT Chest Pulmonary Embolism W Contrast   Final Result   IMPRESSION:      No pulmonary embolism.     Right lower lobe consolidation and scattered areas of mucoid impaction, likely   aspiration pneumonia.     A few small pulmonary nodules measuring up to 8 mm. New left upper lobe nodule   measures 6 mm. The nodules are concerning for metastases.     Marked thickening of the mitral valve leaflets with evidence of prolapse as   well as calcifications. Consider echocardiography for further evaluation.     Dictated by: Dellia Nims, MD,PhD. 10/05/2020 12:38 PM     I, Antony Blackbird, MD,PhD, have reviewed the study and agree with the findings in   this report.  10/05/2020 12:42 PM     XR Chest AP Portable   Final Result   IMPRESSION:    Mild interstitial edema and pulmonary vascular redistribution which could be   seen with increased right heart pressures.   Right basilar airspace opacification could represent  infection or aspiration.     Findings were discussed with Dr. Welton Flakes by Dara Hoyer, MD on 10/05/2020 9:48   AM.      Dictated by: Dara Hoyer, MD. 10/05/2020 9:53 AM     I, Larose Kells, MD, have reviewed the study and agree with the findings in   this report.  10/05/2020 10:21 AM     XR Abdomen AP   Final Result   IMPRESSION:     No evidence of bowel obstruction.     Findings were discussed with Dr. Erenest Blank by Johnn Hai, MD on 10/05/2020 9:50   AM.     Dictated by: Johnn Hai, MD. 10/05/2020 9:59 AM     I, Thornton Dales, MD, have reviewed the study and agree with the findings in   this report.  10/05/2020 10:38 AM     Fluoro Video Swallow W Speech   Final Result   IMPRESSION:     1. No intratracheal aspiration or laryngeal penetration with thin barium on the   one swallow, following multiple attempts.      2. Please  see speech pathology report for full details.     Dictated by: Bess Harvest, MD. 10/04/2020 11:34 AM     I, Marni Griffon, MD,  have reviewed the study and agree with the findings in   this report.  10/04/2020 12:23 PM     XR Chest/Upper Abdomen NG Tube Plcmnt   Final Result   IMPRESSION:     Nasoenteric feeding catheter tip overlies the stomach.     Dictated by: Jacki Cones, MD. 09/27/2020 10:30 PM     I, Thornton Dales, MD, have reviewed the study and agree with the findings in   this report.  09/28/2020 6:38 AM     XR Chest AP Portable    (Results Pending)     MEDICATIONS:   Scheduled Meds:  ? acetaminophen  650 mg Per OG/NG Tube 4 times per day   ? aspirin  325 mg Per OG/NG Tube Daily   ? banana flakes-t-galactooligos.  10.75 g Per OG/NG Tube TID   ? budesonide-formoterol  2 puff Inhalation BID   ? cefTRIAXone  2 g Intravenous Q24H   ? celecoxib  200 mg Oral BID   ? chlorhexidine gluconate  15 mL Swish & Spit BID   ? enoxaparin  40 mg Subcutaneous Q24H   ? famotidine  20 mg Oral BID   ? gabapentin  300 mg Per OG/NG Tube 3 times per day   ? ipratropium-albuterol  3 mL Inhalation Q6H [place]   ? melatonin  5 mg Oral QHS   ? mirtazapine  15 mg Per OG/NG Tube QHS   ? nicotine  1 patch Transdermal Daily   ? prenatal vitamin  1 tablet Per OG/NG Tube Daily   ? protein supplement  30 mL Per OG/NG Tube Daily   ? rosuvastatin  10 mg Oral Daily     Continuous Infusions:    PRN Meds:.baking soda - sodium chl 0.9 %, docusate sodium, hydrocortisone, HYDROmorphone, ipratropium-albuterol, ondansetron, oxycodone, pantoprazole, promethazine, sennosides    ASSESSMENT & PLAN:  Mr. Ayars is a 69 y.o. 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     Perioperative Management: Recommend early ambulation, PT/OT, RT for airway clearance. Wound mgt, pain control, rehab, and DVT PPX per primary team.     E.coli Bacteremia: blood cx drawn for leukocytosis and concern for sepsis and found to 1/2 E.coli(see MET note  10/28)  - ID following and will defer to their recommendations, plan for ceftriaxone for 2weeks     C Diff Colitis: diarrhea resolved  - completed tx with vancomycin but but will need ppx vanc 168m once daily q3 days while on IV abx for bacteremia     HTN:   - continue to hold amlodipine, resume when indicated     COPD/Tobacco Use: no respiratory complaints   - continue symbicort BID and duonebs as needed  - continue nicotine patch 270m(started 10/23)  - visited with smoking cessation counselor, pt is encouraged to quit    HLD:  - continue rosuvastatin daily     Mood Disorder/Insomnia:  - continue mirtazapine qHS    Severe Malnutrition: PEG placed (10/4)  - continue RD to optimize nutrition    Ppx: Per primary team    Please call with questions or concerns.    LeRomualdo BolkNP  Pager: [phone]    Electronically  signed by Patricia Pesa, FNP at 10/13/2020 10:27 AM EDT

## 2020-10-13 NOTE — Progress Notes (Signed)
Formatting of this note is different from the original.  Pt education:    Pt instructed while bolus feeding, able to demonstrate proficient use of g tube. Pt given medicine to push. Pt encouraged to ask questions and be curious about g tube. Pt denies questions regarding use, function of g tube at this time.    Pt aided during dressing change. Pt able to demonstrate accurately how to clean and dress leg wound, leg donor site and incision care of neck. Pt denies questions regarding dressing changes at this time.    Problem: Adult Inpatient Plan of Care  Goal: Plan of Care Review  Outcome: Ongoing, Progressing  Flowsheets  Taken 10/13/2020 0807  Plan of Care Reviewed With: patient  Taken 10/12/2020 1414  Progress: improving    Problem: Infection  Goal: Infection Symptom Resolution  Outcome: Ongoing, Progressing  Intervention: Prevent or Manage Infection  Flowsheets (Taken 10/13/2020 0807)  Isolation Precautions:  ? contact precautions maintained  ? protective environment maintained  Infection Management: aseptic technique maintained    Problem: Communication Impairment (Artificial Airway)  Goal: Effective Communication  Outcome: Ongoing, Progressing  Intervention: Ensure Effective Communication  Flowsheets (Taken 10/13/2020 0807)  Communication Enhancement Strategies:  ? call light answered in person  ? communication board used  ? extra time allowed for response  ? nonverbal strategies used  ? repetition utilized  ? written communication utilized    Problem: Skin and Tissue Injury (Artificial Airway)  Goal: Absence of Device-Related Skin or Tissue Injury  Outcome: Ongoing, Progressing  Intervention: Maintain Skin and Tissue Health  Flowsheets (Taken 10/13/2020 1628)  Device Skin Pressure Protection: adhesive use limited    Electronically signed by Vania Rea, RN at 10/13/2020  4:32 PM EDT

## 2020-10-13 NOTE — Progress Notes (Signed)
Formatting of this note might be different from the original.  Brief Nutrition Note:  Pt now s/p PEG placement.     RD visited patient today to provide home EN education. Provided written and verbal education on EN regimen (detailed below), proper formula storage, and how to appropriately clean supplies.  Discussed potential problems that may arise with TF (such as N/V/D) and how to manage. Answered all questions.     Home tube feed regimen: PEG, bolus feeds    - 4 cartons of Nutren 1.5 daily, 1 cartons per bolus   -30 ml ProStat daily   -Water flushes: 60 ml-before and after feeds   -Above regimen provides 1560 kcal, 83 g pro      Recommended follow up with outpatient RD following discharge;  referral will be placed for [place], Shirlean Mylar. Scheduling line provided to pt: [phone]. RD contact information provided.    Patient has RD contact information and has been encouraged to contact RD if questions/concerns arise.     Plan communicated with team. New orders entered and acknowledged by MD in EPIC. Will continue to monitor and follow patient weights, labs, intake, and clinical course.     Maryella Shivers, MS, RDN, LD  Pager [phone]      Electronically signed by Alvino Blood, RD at 10/13/2020  9:59 AM EDT

## 2020-10-13 NOTE — Progress Notes (Signed)
Formatting of this note is different from the original.     10/13/20 1000   Interdisciplinary Collaboration   Participants case manager; charge nurse; social work/services; dietitian/nutrition services; physician     Pt with PEG placed on 11/4;  Nursing doing PEG teaching;  spoke with pt's roommate/former spouse, who will come to [facility] Sat, 11/6 between 11-12:00 for wound care/med/PEG teaching and will also be taught by [facility] for IV ABT therapy.    Theophilus Kinds [facility]  [facility]: [phone]  Page: [phone]    Electronically signed by Reola Calkins Wysong at 10/13/2020  5:11 PM EDT

## 2020-10-14 NOTE — Progress Notes (Signed)
Formatting of this note is different from the original.  Surgery Progress Note  [facility]    Patient Name: Caleb Robinson  Age: 69 y.o.  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 VSS. Denies abdominal pain. Tolerating tube feeds    Objective     Vital Signs:   Vitals:    10/14/20 0305 10/14/20 0815 10/14/20 0850 10/14/20 0857   BP:  142/59 167/61    BP Location:  Right arm Right arm    Patient Position:  Lying Lying    Pulse:  83 80 86   Resp:  14 18 18    Temp:  36.5 C (97.7 F) 36.6 C (97.9 F)    TempSrc:  Axillary Axillary    SpO2: 93% 93% 100% 99%   Weight:       Height:         Temp (24hrs), Avg:36.6 C (97.9 F), Min:36.5 C (97.7 F), Max:36.7 C (98.1 F)    Ins and Outs:  11/05 0701 - 11/06 0700  In: 1240   Out: -     Physical Exam    General: thin, malnourished. alert, oriented in no acute distress  Neuro: Alert and Oriented and No local findings  HEENT: NC/AT, moist mucous membranes. NGT in place  CVS: RRR on monitor. Extremities warm and well perfused   Resp: no increased work of breathing. Symmetric chest expansion.   GI: Soft, non-distended, nontender. PEG in place with no surrounding erythema or induration  Renal: No foley, voiding spontaneously   Extremities: No swelling, edema or gross abnormalities     Tubes, Lines & Drains  Naso/Oral Tube 10/10/20 1000 nasogastric right nostril (Active)   Securement other (see comments) 10/11/20 2007   Tolerance no adverse signs/symptoms 10/11/20 2007   Clamp Status/Tolerance clamped; no abdominal discomfort; no abdominal distention; no emesis; no nausea; no residual; no restlessness 10/11/20 2007   Flush/Irrigation flushed with; water; no  resistance met 10/10/20 2300   General Intake (mL) 100 10/11/20 1000   Tube Feeding Intake (mL) 250 10/11/20 1200         Recent Labs   Lab 10/12/20  0349 10/13/20  0215 10/14/20  0418   WBC 10.41 10.93* 11.84*   HGB 9.2* 9.6* 8.7*   HCT 28.6* 29.2* 27.1*   PLT 728* 857* 786*   NA 140.0 141.0 140.0   K 4.8 4.8 4.6   CL 106 105 106   CO2CT 29 27 27    BUN 21 15 17    CREATININE 0.8 0.9 0.8   GLUCOSE 88.0 93.0 114.0*   CALCIUM 8.8 9.1 8.7   MG 2.2 2.2 2.0     Lab Results   Component Value Date    INR 1.24 (H) 09/27/2020    PTT 21.9 (L) 09/27/2020     Lab Results   Component Value Date    ALT 63 (H) 10/05/2020    AST  10/05/2020      Comment:      No Result    ALKPHOS 222 (H) 10/05/2020    BILITOT 0.3 10/05/2020     No results found for: CHOL,  TRIG, HDL, LDLCALC  Lab Results   Component Value Date    TSH 1.88 10/09/2020    HGBA1C 4.7 09/29/2020    VITAMINB12 895 09/12/2020    FOLATE 14.2 09/12/2020     No results found for: GLUCOSEU, PROTEINUA, BILIRUBINUR, UROBILINOGEN, LABPH, BLOODU, KETONESU, NITRITE, LEUKOCYTESUR, LABSPEC, COLORU    Medications:  Continuous:    Scheduled:  ? acetaminophen  650 mg Per OG/NG Tube 4 times per day   ? aspirin  325 mg Per OG/NG Tube Daily   ? banana flakes-t-galactooligos.  10.75 g Per OG/NG Tube TID   ? budesonide-formoterol  2 puff Inhalation BID   ? cefTRIAXone  2 g Intravenous Q24H   ? celecoxib  200 mg Oral BID   ? chlorhexidine gluconate  15 mL Swish & Spit BID   ? enoxaparin  40 mg Subcutaneous Q24H   ? famotidine  20 mg Oral BID   ? gabapentin  300 mg Per OG/NG Tube 3 times per day   ? ipratropium-albuterol  3 mL Inhalation Q6H [place]   ? melatonin  5 mg Oral QHS   ? mirtazapine  15 mg Per OG/NG Tube QHS   ? nicotine  1 patch Transdermal Daily   ? prenatal vitamin  1 tablet Per OG/NG Tube Daily   ? protein supplement  30 mL Per OG/NG Tube Daily   ? rosuvastatin  10 mg Oral Daily     PRN:  baking soda - sodium chl 0.9 %, docusate sodium, hydrocortisone, HYDROmorphone,  ipratropium-albuterol, ondansetron, oxycodone, pantoprazole, promethazine, sennosides    Radiology     Fluoro Video Swallow W Speech    Result Date: 10/11/2020  EXAMINATION: MODIFIED BARIUM SWALLOW STUDY (MBSS) 10/11/2020 8:30 AM ACCESSION NUMBER: [id number] INDICATION: POD 12 from oral cavity cancer resection with free flap. Poor swallow function on MBSS on 10/27, please reassess. Assess swallow. COMPARISON: Modified barium swallow study 10/04/2020 TECHNIQUE:  Fluoroscopic  assistance was provided to speech pathology for evaluation of swallowing during a video fluoroscopic swallow study. The patient was given various consistencies of barium sulfate suspension to swallow under fluoroscopic visualization. FLUORO TIME: 1.8 minutes; FLUORO DAP: 64.6 microGy*m^2 FINDINGS: Flexible catheter was used to gain access orally passes surgical site and thin and nectar barium consistencies were slowly injected after which the patient was instructed swallow. Laryngeal penetration with barium of thin consistency. No intratracheal aspiration with any tested barium consistencies. Surgical clips, nasogastric feeding tube and plate and screw fixation of the mandible overlie the images.    IMPRESSION: 1. Laryngeal penetration with thin barium consistency. 2. Please see speech pathology report for full details. Dictated by: Bess Harvest, MD. 10/11/2020 9:58 AM I, Ernestina Patches, MD, have reviewed the study and agree with the findings in this report.  10/11/2020 12:01 PM    XR Chest/Upper Abdomen NG Tube Plcmnt    Result Date: 10/10/2020  EXAMINATION: ABDOMINAL RADIOGRAPH 10/10/2020 12:18 PM ACCESSION NUMBER: [id number] INDICATION: DHT placement. DHT placement. COMPARISON: 10/06/2020 and PET/CT 09/11/2020 TECHNIQUE: Anteroposterior supine radiograph of the upper abdomen was obtained. FINDINGS: Dobbhoff tube tip overlies the stomach. Surgical clips overlie the pelvis. Nonobstructive bowel gas pattern. Unchanged osseous structures. Right basilar  airspace disease again seen.     IMPRESSION: Dobbhoff tube tip overlies the stomach. Dictated by: Bess Harvest, MD. 10/10/2020 2:37 PM I, Thornton Dales, MD, have reviewed the study and agree with the findings in this report.  10/10/2020 3:00 PM    Assessment & Plan     69 y.o.malewith PMH  of anterior OC SCCa who is 14 Days Post-Op s/p resection and free flap reconstruction. Now 2 Days Post-Op PEG placement. Patient tolerating tube feeds at goal. Denies abdominal pain    - General Surgery will sign off at this time. Please call with any questions or concerns    Pandora Leiter,  [place] [facility] [place] [facility] [place]  Pager #: [phone]  10/14/20  10:23 AM      Electronically signed by Pandora Leiter, MD at 10/14/2020 10:24 AM EDT

## 2020-10-14 NOTE — Nursing Note (Signed)
Formatting of this note might be different from the original.  Documentation for Supplemental Oxygen.  Instructions:   1. Answer questions with the F2 button or the yellow arrows in the toolbar.  2. Complete within 48 hours of discharge.   3. Document single saturations, not saturation ranges.  4. Oxygen reimbursement rates change above 4 L/min. Thus this test must demonstrate that 4 L/min does not maintain adequate saturation in order to receive coverage for higher flow rates.  5. Do not change the wording below which was carefully selected to meet insurance requirements.    1. The patient's Oxygen saturation Resting on Room Air is 96% while awake.  Is this 88% or below? No     2. The patient's oxygen saturation asleep on 2L nasal cannula was 87%.  Is this 88% or below?     3. Is this patient is ambulatory? Yes, the patient's oxygen saturation ambulating on room air was 95%  If ambulatory, is this 88% or below? No    4. Does this patient require supplemental oxygen equal to or greater than 4 L per minute?  Ellin Saba, [place]  Electronically signed by Aviva Kluver, RN at 10/14/2020 12:32 PM EDT

## 2020-10-14 NOTE — Progress Notes (Signed)
Formatting of this note might be different from the original.  For all Team C patient questions from 6am-5pm during the week please contact the Team C intern at [phone] or their pager. On weekdays between 5pm-6am and weekends please first attempt to contact the intern on call at [phone] or, secondarily, the junior ENT resident on call.    Otolaryngology--Head and Neck Surgery Progress Note    Patient Name: Caleb Robinson  Date: 10/14/2020  Admission Date: 09/27/2020  Service: Otolaryngology--Head and Neck Surgery    Subjective:   AFVSS. On RA. Restarted tube feeds following PEG placement. 10 d course of PO vanc for C. Diff finished, now will require once q3d while still on IV abx for prophylaxis. PEG teaching with RN.    Objective:  General: NAD  HEENT: supple neck with midline trachea, surgical incision sites c/d/i, JP in place to bulb suction with ss output. Intraoral flap with some epidermolysis but good turgor, good cap refill and no evidence of congestion. Decannulated, some expected postoperative edema.  DHT in place  CV: pulses intact, regular rate  Resp: no increased work of breathing on TC  Ext: no cyanosis or swelling or rashes. Donor site under CAM boot, wound vac in place.    Neuro: EOMI    Assessment:   69 y.o. male with PMH of anterior OC SCCa, active smoker with COPD who is 2 Days Post-Op s/p 69yo M w/  now s/p Comp OC w/ segmental mandibulectmy, partial gloss, b/l ND, trach, L fibula free flap, STSG. Vessels: L Facial A, retromandibular V.     Neuro:   - Multimodal pain control    HEENT:   -RN flap checks q4h  - BID MD flap checks   - Vaseline ointment to incisions BID  - continuous pulse ox.   - Oral care    CV/Heme:  - Continue tele  - Notify MD if Systolic blood pressure > 170.   - Notify MD for sustained pulse > 110 while pain is well controlled  - ASA 325    Pulm:   - Wean O2 to keep sats >92%    FENGI:  - Replete electrolytes to keep K >4.0, Mg>2.0  - Diet Nutren 1.5  NPO Diet Midnight   - Start  tube feeds through G-tube today    ID:   - s/p unasyn course   - PO Vanc course for C. Diff through 11/15.  Will require PO vanc ppx for C. Diff every 3 days starting on 11/16 while on IV abx  - Zosyn for blood cx until 11/11 (can switch to Ceftriaxone pending sensitivities)  - blood growing E coli in 1 specimen and tracheal aspirate growing pasteurella.  - Probiotics    ENDO:   - SSI as appropriate    PPx:   - DVT: LVX 30 BID  - GI: H2     MSK:   - Will follow PT/OT     Dispo  - Floor  - Discharge this weekend    Bartholomew Boards MD [facility]  PGY-2, Otolaryngology Head and Neck [place] [facility] [place]   Pager: [phone]      Electronically signed by Arelia Sneddon, MD at 10/14/2020  6:47 AM EDT

## 2020-10-14 NOTE — Nursing Note (Signed)
Formatting of this note might be different from the original.  Documentation for Supplemental Oxygen.  Instructions:   1. Answer questions with the F2 button or the yellow arrows in the toolbar.  2. Complete within 48 hours of discharge.   3. Document single saturations, not saturation ranges.  4. Oxygen reimbursement rates change above 4 L/min. Thus this test must demonstrate that 4 L/min does not maintain adequate saturation in order to receive coverage for higher flow rates.  5. Do not change the wording below which was carefully selected to meet insurance requirements.    1. The patient's Oxygen saturation Resting on Room Air is 80% while sleeping  Is this 88% or below? Yes, when placed on oxygen, the patient was able to maintain a saturation of 93% on 2L LPM of oxygen while resting. Discussed with Dr. Gilford Rile possible need for home O2. Day nursing team to do formal walking tesr.    3. Does this patient require supplemental oxygen equal to or greater than 4 L per minute?  No.  Electronically signed by Adrian Saran, RN at 10/14/2020  7:30 AM EDT

## 2020-10-14 NOTE — Nursing Note (Signed)
Formatting of this note might be different from the original.  Caleb Robinson Pt Alert & Oriented x4 Vital Signs  stable BP 134/62 (BP Location: Right arm, Patient Position: Lying)   Pulse 78   Temp 36.4 C (97.5 F) (Axillary)   Resp 18   Ht 162.6 cm (_0 )   Wt (!) 43.8 kg (96 lb 9 oz)   SpO2 96%   BMI 16.57 kg/m . Pt discharged to Home  remain at home  as needed .   Prescriptions filled at [facility] out pt pharm prior to discharge and in hand at time of discharge.  MED QUESTIONS: No questions at this time.   Pt confirmed with RN desire to be discharged after discussion with providers.   Reviewed AVS with patient and patient, significant other, and daughter in-law. Teaching method included Instructed, Demo Given, and Written Material All questions were answered and TEACHING GOALS: met  with positive teach back for all instructions, including all incision care, activity restrictions, diet, Rx and emergency care and follow-up.   Adult Access Removed/Discontinued: Peripheral IV removed.  No edema, redness, or drainage noted at the site.  Devices; drains: Gastrostomy Tube (to remain in place).   Medical supplies in hand including home wound care items  Tolerating NPO Diet- PEG tube feedings  Pain PAIN CONTROL: Pain is controlled with current analgesics.  Medication(s) being used: acetaminophen and narcotic analgesics including oxycodone.  All pt belongings left with pt.   Pt left [address] floor via wheelchair.   Discharge process was normal.  Marzetta Merino  [name]      Electronically signed by Aviva Kluver, RN at 10/14/2020  4:52 PM EDT

## 2020-10-30 ENCOUNTER — Encounter

## 2020-10-31 NOTE — Telephone Encounter (Signed)
[phone] opt [phone]   Call from employer stating they are waiting on information regarding condition and dates to be out of work

## 2020-10-31 NOTE — Telephone Encounter (Signed)
Pt known to Dr. 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

## 2020-10-31 NOTE — Telephone Encounter (Signed)
Notified employer that pt has since gone elsewhere for treatment to follow up current physician.

## 2020-11-17 ENCOUNTER — Encounter

## 2020-11-18 ENCOUNTER — Encounter

## 2020-11-19 MED ORDER — IBUPROFEN 800 MG TAB
800 mg | ORAL_TABLET | ORAL | 0 refills | Status: DC
Start: 2020-11-19 — End: 2020-12-22

## 2020-11-20 ENCOUNTER — Ambulatory Visit: Primary: Family Medicine

## 2020-11-20 ENCOUNTER — Ambulatory Visit: Attending: Hematology & Oncology | Primary: Family Medicine

## 2020-11-20 ENCOUNTER — Inpatient Hospital Stay: Admit: 2020-11-20 | Payer: MEDICARE | Primary: Family Medicine

## 2020-11-20 ENCOUNTER — Ambulatory Visit
Admit: 2020-11-20 | Discharge: 2020-11-20 | Payer: MEDICARE | Attending: Hematology & Oncology | Primary: Family Medicine

## 2020-11-20 ENCOUNTER — Ambulatory Visit: Admit: 2020-11-20 | Discharge: 2020-11-20 | Payer: MEDICARE | Primary: Family Medicine

## 2020-11-20 DIAGNOSIS — C029 Malignant neoplasm of tongue, unspecified: Secondary | ICD-10-CM

## 2020-11-20 DIAGNOSIS — Z008 Encounter for other general examination: Secondary | ICD-10-CM

## 2020-11-20 LAB — COMPREHENSIVE METABOLIC PANEL
ALT: 31 U/L (ref 12–65)
AST: 19 U/L (ref 15–37)
Albumin/Globulin Ratio: 0.8 — ABNORMAL LOW (ref 1.2–3.5)
Albumin: 3 g/dL — ABNORMAL LOW (ref 3.2–4.6)
Alkaline Phosphatase: 132 U/L (ref 50–136)
Anion Gap: 4 mmol/L — ABNORMAL LOW (ref 7–16)
BUN: 32 MG/DL — ABNORMAL HIGH (ref 8–23)
CO2: 30 mmol/L (ref 21–32)
Calcium: 9.6 MG/DL (ref 8.3–10.4)
Chloride: 106 mmol/L (ref 98–107)
Creatinine: 1 MG/DL (ref 0.8–1.5)
EGFR IF NonAfrican American: >60 mL/min/{1.73_m2} (ref >60)
GFR African American: >60 mL/min/{1.73_m2} (ref >60)
Globulin: 3.7 g/dL — ABNORMAL HIGH (ref 2.3–3.5)
Glucose: 90 mg/dL (ref 65–100)
Potassium: 4.2 mmol/L (ref 3.5–5.1)
Sodium: 140 mmol/L (ref 136–145)
Total Bilirubin: 0.2 MG/DL (ref 0.2–1.1)
Total Protein: 6.7 g/dL (ref 6.3–8.2)

## 2020-11-20 LAB — CBC WITH AUTO DIFFERENTIAL
Basophils %: 1 % (ref 0.0–2.0)
Basophils Absolute: 0.1 10*3/uL (ref 0.0–0.2)
Eosinophils %: 2 % (ref 0.5–7.8)
Eosinophils Absolute: 0.2 10*3/uL (ref 0.0–0.8)
Granulocyte Absolute Count: 0 10*3/uL (ref 0.0–0.5)
Hematocrit: 34.5 %
Hemoglobin: 10.5 g/dL — ABNORMAL LOW (ref 13.6–17.2)
Immature Granulocytes: 0 % (ref 0.0–5.0)
Lymphocytes %: 17 % (ref 13–44)
Lymphocytes Absolute: 1.7 10*3/uL (ref 0.5–4.6)
MCH: 29.4 PG (ref 26.1–32.9)
MCHC: 30.4 g/dL — ABNORMAL LOW (ref 31.4–35.0)
MCV: 96.6 FL (ref 79.6–97.8)
MPV: 10.7 FL (ref 9.4–12.3)
Monocytes %: 10 % (ref 4.0–12.0)
Monocytes Absolute: 1 10*3/uL (ref 0.1–1.3)
NRBC Absolute: 0 10*3/uL (ref 0.0–0.2)
Neutrophils %: 69 % (ref 43–78)
Neutrophils Absolute: 6.6 10*3/uL (ref 1.7–8.2)
Platelets: 405 10*3/uL (ref 150–450)
RBC: 3.57 M/uL — ABNORMAL LOW (ref 4.23–5.6)
RDW: 14.7 % — ABNORMAL HIGH (ref 11.9–14.6)
WBC: 9.6 10*3/uL (ref 4.3–11.1)

## 2020-11-20 LAB — MAGNESIUM
Magnesium: 2.3 mg/dL (ref 1.8–2.4)
Magnesium: 2.3 mg/dL (ref 1.8–2.4)

## 2020-11-20 LAB — METABOLIC PANEL, COMPREHENSIVE
A-G Ratio: 0.8 — ABNORMAL LOW (ref 1.2–3.5)
ALT (SGPT): 31 U/L (ref 12–65)
AST (SGOT): 19 U/L (ref 15–37)
Albumin: 3 g/dL — ABNORMAL LOW (ref 3.2–4.6)
Alk. phosphatase: 132 U/L (ref 50–136)
Anion gap: 4 mmol/L — ABNORMAL LOW (ref 7–16)
BUN: 32 MG/DL — ABNORMAL HIGH (ref 8–23)
Bilirubin, total: 0.2 MG/DL (ref 0.2–1.1)
CO2: 30 mmol/L (ref 21–32)
Calcium: 9.6 MG/DL (ref 8.3–10.4)
Chloride: 106 mmol/L (ref 98–107)
Creatinine: 1 MG/DL (ref 0.8–1.5)
GFR est AA: >60 mL/min/{1.73_m2} (ref >60)
GFR est non-AA: >60 mL/min/{1.73_m2} (ref >60)
Globulin: 3.7 g/dL — ABNORMAL HIGH (ref 2.3–3.5)
Glucose: 90 mg/dL (ref 65–100)
Potassium: 4.2 mmol/L (ref 3.5–5.1)
Protein, total: 6.7 g/dL (ref 6.3–8.2)
Sodium: 140 mmol/L (ref 136–145)

## 2020-11-20 LAB — CBC WITH AUTOMATED DIFF
ABS. BASOPHILS: 0.1 10*3/uL (ref 0.0–0.2)
ABS. EOSINOPHILS: 0.2 10*3/uL (ref 0.0–0.8)
ABS. IMM. GRANS.: 0 10*3/uL (ref 0.0–0.5)
ABS. LYMPHOCYTES: 1.7 10*3/uL (ref 0.5–4.6)
ABS. MONOCYTES: 1 10*3/uL (ref 0.1–1.3)
ABS. NEUTROPHILS: 6.6 10*3/uL (ref 1.7–8.2)
ABSOLUTE NRBC: 0 10*3/uL (ref 0.0–0.2)
BASOPHILS: 1 % (ref 0.0–2.0)
EOSINOPHILS: 2 % (ref 0.5–7.8)
HCT: 34.5 %
HGB: 10.5 g/dL — ABNORMAL LOW (ref 13.6–17.2)
IMMATURE GRANULOCYTES: 0 % (ref 0.0–5.0)
LYMPHOCYTES: 17 % (ref 13–44)
MCH: 29.4 PG (ref 26.1–32.9)
MCHC: 30.4 g/dL — ABNORMAL LOW (ref 31.4–35.0)
MCV: 96.6 FL (ref 79.6–97.8)
MONOCYTES: 10 % (ref 4.0–12.0)
MPV: 10.7 FL (ref 9.4–12.3)
NEUTROPHILS: 69 % (ref 43–78)
PLATELET: 405 10*3/uL (ref 150–450)
RBC: 3.57 M/uL — ABNORMAL LOW (ref 4.23–5.6)
RDW: 14.7 % — ABNORMAL HIGH (ref 11.9–14.6)
WBC: 9.6 10*3/uL (ref 4.3–11.1)

## 2020-11-20 NOTE — Progress Notes (Signed)
Progress Notes by Rayann Heman, MD at 11/20/20 1030                Author: Rayann Heman, MD  Service: --  Author Type: Physician       Filed: 11/20/20 1651  Encounter Date: 11/20/2020  Status: Signed          Editor: Rayann Heman, MD (Physician)                          [facility] VISIT         Patient Name: Caleb Robinson              Date of Visit: 11/20/2020   DOB: 06/28/1951   Age:69 y.o.                        Presenting Complaint:   Caleb Robinson  is seen in follow-up for a  tongue cancer.      History of Present Illness:   Mr. Stetzer was seen for the first time in our office in July 2021.  He was a gentleman with a rather substantial alcohol and tobacco history.  He has smoked upwards of 2-1/2 packs/day during the  course of his life but now is down to less than a pack.  He indicated that his alcohol use was substantial prior to 9 years ago when his intake decreased significant after the death of his wife.  In 2019-12-10 he first noticed some tongue discomfort  associated with weight loss.  By May 2021 his discomfort had significantly increased and he sought medical attention.  CT scanning of his neck on May 26 showed right greater than left submandibular gland enlargement with an indeterminate lymph node at  level 2 on the right he was seen by Dr. Elby Beck on May 28.  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   The remainder of the laryngoscopy was negative.  A subsequent PET scan was performed notable for FDG uptake within the ventral tongue.  There was an FDG avid right level 2A lymph node concerning for metastasis.  There was faint FDG uptake associated  with nodules noted in the left lung 1 of which was new from a CT on June 23.  A previously seen right lung nodule was not FDG  avid.    He returns today for follow-up.  Following his last visit with me, he was again seen at [facility] and ultimately underwent surgery.  It is not clear to me that the issue of his pulmonary nodules were ever fully resolved.  Regardless, on October 20, he underwent  excision of most of the tongue floor of the mouth and jaw with a radical neck dissection.  He had a transient tracheostomy and a G-tube was placed.  The pathology report is not visible in Care Everywhere at this time.  However, Dr. Elana Alm describes the tumor  as [id number] clinically.  At this point, the patient is feeding himself via his G-tube.  He is having some difficulty with speech and swallowing and is working with speech therapy.  He has no cough or shortness of breath.  He is quite thin and somewhat  weak following surgery.  He has no cough or shortness of breath.  He is  bothered by issues related to his wound in his reconstructed mandible.   Medications:      Current Outpatient Medications          Medication  Sig  Dispense  Refill           ?  ibuprofen (MOTRIN) 800 mg tablet  Take 1 tablet by mouth twice daily as needed for pain  60 Tablet  0     ?  mirtazapine (REMERON) 15 mg tablet  Take 1 tablet by mouth nightly  90 Tablet  0     ?  rosuvastatin (CRESTOR) 10 mg tablet  Take 1 tablet by mouth nightly  90 Tablet  0     ?  Trelegy Ellipta 100-62.5-25 mcg inhaler  Take 1 Puff by inhalation daily.  30 Each  3           ?  lidocaine (XYLOCAINE) 2 % solution  Take 15 mL by mouth as needed for Pain. Place on sore spot in mouth, can dilute with water if too thick  1 Bottle  2           ?  multivitamin (ONE A DAY) tablet  Take 1 Tablet by mouth daily.         ?  melatonin 10 mg tab  Take  by mouth. Take at night         ?  cholecalciferol (Vitamin D3) 25 mcg (1,000 unit) cap  Take  by mouth daily.         ?  ferrous sulfate 325 mg (65 mg iron) tablet  Take  by mouth Daily (before breakfast).         ?  potassium 99 mg tablet  Take 99 mg by mouth  daily.         ?  gabapentin (NEURONTIN) 100 mg capsule  Take 2 Capsules by mouth three (3) times daily as needed for Pain (Restless leg).  360 Capsule  3     ?  amLODIPine (NORVASC) 5 mg tablet  Take 1 Tablet by mouth daily.  90 Tablet  3     ?  aspirin delayed-release 81 mg tablet  Take 1 Tab by mouth daily.  90 Tab  3     ?  albuterol (PROVENTIL VENTOLIN) 2.5 mg /3 mL (0.083 %) nebu  3 mL by Nebulization route every six (6) hours as needed for Wheezing. One premix vial every 6hrs prn.  30 Nebule  5           ?  albuterol (PROVENTIL HFA, VENTOLIN HFA, PROAIR HFA) 90 mcg/actuation inhaler  Take 2 Puffs by inhalation every six (6) hours as needed for Wheezing. (Patient not taking: Reported on 06/27/2020)  1 Inhaler  6           Allergies:   No Known Allergies      Review of Systems:   The Review of Systems is documented in full in the internal medical record. All systems are negative other than for those noted above.       Past Medical History:     Past Medical History:        Diagnosis  Date         ?  Chronic obstructive pulmonary disease ([place])            follows with Pulmonology.  daily and rescure inhaler. no home O2.         ?  Emphysema lung ([place])           ?  HTN (hypertension)             Past Surgical History:     Past Surgical History:         Procedure  Laterality  Date          ?  HX HERNIA REPAIR    05/2019           Social History:     Social History          Tobacco Use         ?  Smoking status:  Current Every Day Smoker              Packs/day:  0.75         Years:  45.00         Pack years:  33.75         ?  Smokeless tobacco:  Never Used        ?  Tobacco comment: stopped but restarted Dec 2019       Vaping Use         ?  Vaping Use:  Never used       Substance Use Topics         ?  Alcohol use:  Yes              Alcohol/week:  1.0 standard drink         Types:  1 Cans of beer per week             Comment: occ         ?  Drug use:  Never           Family History:     Family History         Problem   Relation  Age of Onset          ?  Dementia  Mother                Physical Examination:   General Appearance: Thin chronically ill-appearing patient in no acute distress.    Vital signs:    Visit Vitals      BP  (!) 98/54 (BP 1 Location: Left upper arm, BP Patient Position: Standing)     Pulse  66     Temp  97.5 ??F (36.4 ??C) (Oral)     Resp  18     Ht  5' 4"  (1.626 m)     Wt  92 lb 12.8 oz (42.1 kg)     SpO2  96%        BMI  15.93 kg/m??         Performance Status: ECOG Level  2   Distress Screening Score:      3 most recent PHQ Screens  11/20/2020        Little interest or pleasure in doing things  Not at all     Feeling down, depressed, irritable, or hopeless  Not at all        Total Score PHQ 2  0        Pain Scale: 0 - No pain/10      HEENT: He has lost virtually all of his tongue and has a muscle flap lining the floor of the mouth.  There is area of crusting at sites of healing.  Most notably however there is evident metal from the mandibular prosthesis that is evident in areas where  the mandibular mucosa has not covered it.  This is located anteriorly and primarily to the right and covers a span of approximately 2 cm.   Neck: Extensive surgery evident   Lymph nodes: There is extensive surgery in the neck but no nodes are palpable   Lungs: The lungs are clear to auscultation and percussion. There is no egophony. There is no chest wall tenderness and no  use of accessory respiratory musculature.   Heart: There is no jugular venous distention. The rate is normal and rhythm regular. The S1 and S2 are normal and there are no murmurs or rubs.     Abdomen: Soft, non-tender, bowel sounds present and normal, no appreciated hepatosplenomegaly. No palpable masses.  G-tube in place.  Skin: No rash, petechiae or ecchymoses. No evidence of malignancy.    Extremities: No cyanosis, clubbing or edema.          Labs/Imaging:     Lab Results         Component  Value  Date/Time            WBC  9.6  11/20/2020 10:33 AM       HGB   10.5 (L)  11/20/2020 10:33 AM       HCT  34.5  11/20/2020 10:33 AM       PLATELET  405  11/20/2020 10:33 AM            MCV  96.6  11/20/2020 10:33 AM             Lab Results         Component  Value  Date/Time            Sodium  140  11/20/2020 10:33 AM       Potassium  4.2  11/20/2020 10:33 AM       Chloride  106  11/20/2020 10:33 AM       CO2  30  11/20/2020 10:33 AM       Anion gap  4 (L)  11/20/2020 10:33 AM       Glucose  90  11/20/2020 10:33 AM       BUN  32 (H)  11/20/2020 10:33 AM       Creatinine  1.00  11/20/2020 10:33 AM       GFR est AA  >60  11/20/2020 10:33 AM       GFR est non-AA  >60  11/20/2020 10:33 AM       Calcium  9.6  11/20/2020 10:33 AM       Alk. phosphatase  132  11/20/2020 10:33 AM       Protein, total  6.7  11/20/2020 10:33 AM       Albumin  3.0 (L)  11/20/2020 10:33 AM       Globulin  3.7 (H)  11/20/2020 10:33 AM       A-G Ratio  0.8 (L)  11/20/2020 10:33 AM            ALT (SGPT)  31  11/20/2020 10:33 AM           Above results reviewed with patient.             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  Based on the clinical features preoperatively 1 would presume he would  benefit from radiation therapy.  I do not have the final pathology from the surgery however.  Complicating matters further is  the presence of exposed mandibular prosthesis.   PLAN:   I will send him to radiation oncology for an assessment and will attempt to retrieve his pathology report.  I suspect the findings will suggest that he needs not only radiation therapy but perhaps concurrent  chemotherapy as well.  I have real questions about his functional status and ability to tolerate although that.  To make things more complicated, he has exposed hardware from his mandibular reconstruction which would likely further impact timing of any  postoperative treatment.            RESUSCITATION DIRECTIVES/HOSPICE CARE;   Full  Support          Rayann Heman MD FACP      Oncology and Hematology Program Director   [facility] [place] [address]   P [phone]   F [phone]   [email or web address]            Elements of this note have been dictated using speech recognition software. As a result, errors of speech recognition may have occurred.

## 2020-11-20 NOTE — Progress Notes (Signed)
Nutrition F/U:  Assessment:  Pt seen during office visit w/ Dr. Sonny Masters, s/p composite glossectomy w/ radical neck dissection in October @ [place], was initially referred to [place], but then corrected and referred back to [place].  Pt does not recall if he was told that he needed adjuvant therapy, referral made to Dr. Nance Pew today to evaluate for adjuvant radiation.  Pt remains TF dependent for estimated nutrition needs - bolus feeding 4 cartons of Nutren 1.5 to provide 1500 kcal, 68 gm protein, 764 ml (free water from TF), reports giving 1 serving of liquid pro-stat daily proving an additional 100 kcal and 17 gm protein.  Pt is tolerating liquids po via syringe, recently provided squeeze bottle by SLP @ [facility].  Pt is working w/ home health SLP - encouraged to let us know when he was discharged from home health as he will need continued aggressive OP SLP services.  Pt went to ED at [facility] on (12/4) - CT scan showing lower lobe lung mass, prior CT did not show this; pt has repeat CT in January and follow up w/ pulmonary.  Pt does have exposed metal on lower right jaw - may not be a candidate for radiation w/ this exposed, Dr. Nance Pew to see on (12/15).  Current BW: 92#, down 3# since last visit at the cancer center 3 months ago prior to surgery.      Intervention:  1. Continue w/ po diet per SLP @ [facility] - liquids via syringe and squeeze bottle  2. Continue w/ current TF regimen - 4 cartons of Nutren 1.5 daily + 1 serving of liquid Pro-stat daily.  3. Rad Onc consult (12/15)    Monitoring/Evaluation:  1. RD to follow up during next office visit - follow up wt status, tolerance/intake of po diet and TF, symptom management.    [place], [place], [place]

## 2020-11-22 ENCOUNTER — Ambulatory Visit: Attending: Family Medicine | Primary: Family Medicine

## 2020-11-22 ENCOUNTER — Inpatient Hospital Stay: Admit: 2020-11-22 | Payer: MEDICARE | Attending: Radiation Oncology | Primary: Family Medicine

## 2020-11-22 ENCOUNTER — Ambulatory Visit: Admit: 2020-11-22 | Discharge: 2020-11-22 | Payer: MEDICARE | Attending: Family Medicine | Primary: Family Medicine

## 2020-11-22 DIAGNOSIS — Z0000 Encounter for general adult medical examination without abnormal findings: Secondary | ICD-10-CM

## 2020-11-22 DIAGNOSIS — C049 Malignant neoplasm of floor of mouth, unspecified: Secondary | ICD-10-CM

## 2020-11-22 NOTE — Consults (Signed)
Patient: Caleb Robinson MRN: [id number]  SSN: [id number]    Date of Birth: Dec 25, 1950  Age: 69 y.o.  Sex: male      Other Providers:  Dr. Sonny Masters and Dr. Elana Alm (ENT at [facility]) and Dr. Elby Beck    CHIEF COMPLAINT: can't eat    DIAGNOSIS: pT3N3b floor of mouth SCCa    PREVIOUS TREATMENT:  1) 09/27/2020: Resection and reconstruction    HISTORY OF PRESENT ILLNESS:  Caleb Robinson is a 69 y.o. male who I am seeing at the request of Dr. Sonny Masters. His oncologic history dates to Dec 2020 when he began noticing tongue discomfort. It continued to worsen and so he ultimately sought medical advice in May. CT showed an enlarged R level 2 LN. He then underwent biopsy of the floor of mouth with pathology revealing SCCa. PET/CT was done and this revealed uptake in floor of mouth as well as the R level 2 LN. He was then seen at [facility] by Dr. Elana Alm and underwent resection of this mass with reconstruction. Pathology revealed T3N3b disease with ECE. Margins were clear and there was no evidence of PNI.   Post-op, he's slowly recovering. He is unable to eat po. PEG tube was placed post-op. He also notes that the reconstructed mandible is showing through his gum inferiorly. Denies pain.    PAST MEDICAL HISTORY:    Past Medical History:   Diagnosis Date   ??? Chronic obstructive pulmonary disease ([place])     follows with Pulmonology.  daily and rescure inhaler. no home O2.   ??? Emphysema lung ([place])    ??? HTN (hypertension)        The patient denies history of collagen vascular diseases, pacemaker insertion, prior radiation or prior chemotherapy.     PAST SURGICAL HISTORY:   Past Surgical History:   Procedure Laterality Date   ??? HX HERNIA REPAIR  05/2019       MEDICATIONS:     Current Outpatient Medications:   ???  ibuprofen (MOTRIN) 800 mg tablet, Take 1 tablet by mouth twice daily as needed for pain, Disp: 60 Tablet, Rfl: 0  ???  rosuvastatin (CRESTOR) 10 mg tablet, Take 1 tablet by mouth nightly, Disp: 90 Tablet, Rfl: 0  ???  Trelegy Ellipta  100-62.5-25 mcg inhaler, Take 1 Puff by inhalation daily., Disp: 30 Each, Rfl: 3  ???  multivitamin (ONE A DAY) tablet, Take 1 Tablet by mouth daily., Disp: , Rfl:   ???  melatonin 10 mg tab, Take  by mouth. Take at night, Disp: , Rfl:   ???  cholecalciferol (Vitamin D3) 25 mcg (1,000 unit) cap, Take  by mouth daily., Disp: , Rfl:   ???  gabapentin (NEURONTIN) 100 mg capsule, Take 2 Capsules by mouth three (3) times daily as needed for Pain (Restless leg)., Disp: 360 Capsule, Rfl: 3  ???  albuterol (PROVENTIL HFA, VENTOLIN HFA, PROAIR HFA) 90 mcg/actuation inhaler, Take 2 Puffs by inhalation every six (6) hours as needed for Wheezing., Disp: 1 Inhaler, Rfl: 6  ???  albuterol (PROVENTIL VENTOLIN) 2.5 mg /3 mL (0.083 %) nebu, 3 mL by Nebulization route every six (6) hours as needed for Wheezing. One premix vial every 6hrs prn., Disp: 30 Nebule, Rfl: 5    ALLERGIES:   No Known Allergies    SOCIAL HISTORY:   Social History     Socioeconomic History   ??? Marital status: SINGLE     Spouse name: Not on file   ??? Number of children: Not  on file   ??? Years of education: Not on file   ??? Highest education level: Not on file   Occupational History   ??? Occupation: [employer]   Tobacco Use   ??? Smoking status: Current Every Day Smoker     Packs/day: 0.75     Years: 45.00     Pack years: 33.75   ??? Smokeless tobacco: Never Used   ??? Tobacco comment: stopped but restarted Dec 2019   Vaping Use   ??? Vaping Use: Never used   Substance and Sexual Activity   ??? Alcohol use: Yes     Alcohol/week: 1.0 standard drink     Types: 1 Cans of beer per week     Comment: occ   ??? Drug use: Never   ??? Sexual activity: Not on file     Comment: widow   Other Topics Concern   ??? Not on file   Social History Narrative   ??? Not on file     Social Determinants of Health     Financial Resource Strain:    ??? Difficulty of Paying Living Expenses: Not on file   Food Insecurity:    ??? Worried About Running Out of Food in the Last Year: Not on file   ??? Ran Out of Food in the Last  Year: Not on file   Transportation Needs:    ??? Lack of Transportation (Medical): Not on file   ??? Lack of Transportation (Non-Medical): Not on file   Physical Activity:    ??? Days of Exercise per Week: Not on file   ??? Minutes of Exercise per Session: Not on file   Stress:    ??? Feeling of Stress : Not on file   Social Connections:    ??? Frequency of Communication with Friends and Family: Not on file   ??? Frequency of Social Gatherings with Friends and Family: Not on file   ??? Attends Religious Services: Not on file   ??? Active Member of Clubs or Organizations: Not on file   ??? Attends Archivist Meetings: Not on file   ??? Marital Status: Not on file   Intimate Partner Violence:    ??? Fear of Current or Ex-Partner: Not on file   ??? Emotionally Abused: Not on file   ??? Physically Abused: Not on file   ??? Sexually Abused: Not on file   Housing Stability:    ??? Unable to Pay for Housing in the Last Year: Not on file   ??? Number of Places Lived in the Last Year: Not on file   ??? Unstable Housing in the Last Year: Not on file       FAMILY HISTORY:   Family History   Problem Relation Age of Onset   ??? Dementia Mother        REVIEW OF SYSTEMS: Please see the completed review of systems sheet in the chart that I have reviewed today.      PHYSICAL EXAMINATION:   ECOG Performance status 2  VITAL SIGNS:   Visit Vitals  BP 103/64   Pulse 88   Temp 97 ??F (36.1 ??C)   Wt 41.7 kg (92 lb)   SpO2 92%   BMI 15.79 kg/m??      General: thin adult Male in no acute distress; appears stated age  53: normocephalic, atraumatic; EOMI; jaw reconstruction showing through mendibular gumline  Neck: supple with full ROM; neck dissection scar well-healed  Respiratory: normal inspiratory effort, no  audible wheezes  Extremities: no cyanosis, clubbing, or edema  Musculoskeletal: mobility intact x4; normal ROM in all joints  Skin: no skin lesions identified  Neuro: AOx3; sensation intact x 4; CNII-XII grossly intact  Psych: appropriate affect, insight, and  judgement  GI: abdomen soft, non-distended    PATHOLOGY:    Pathology report reviewed - see HPI    LABORATORY:   Lab Results   Component Value Date/Time    Sodium 140 11/20/2020 10:33 AM    Potassium 4.2 11/20/2020 10:33 AM    Chloride 106 11/20/2020 10:33 AM    CO2 30 11/20/2020 10:33 AM    Anion gap 4 (L) 11/20/2020 10:33 AM    Glucose 90 11/20/2020 10:33 AM    BUN 32 (H) 11/20/2020 10:33 AM    Creatinine 1.00 11/20/2020 10:33 AM    GFR est AA >60 11/20/2020 10:33 AM    GFR est non-AA >60 11/20/2020 10:33 AM    Calcium 9.6 11/20/2020 10:33 AM    Magnesium 2.3 11/20/2020 10:33 AM    Albumin 3.0 (L) 11/20/2020 10:33 AM    Protein, total 6.7 11/20/2020 10:33 AM    Globulin 3.7 (H) 11/20/2020 10:33 AM    A-G Ratio 0.8 (L) 11/20/2020 10:33 AM    ALT (SGPT) 31 11/20/2020 10:33 AM     Lab Results   Component Value Date/Time    WBC 9.6 11/20/2020 10:33 AM    HGB 10.5 (L) 11/20/2020 10:33 AM    HCT 34.5 11/20/2020 10:33 AM    PLATELET 405 11/20/2020 10:33 AM       RADIOLOGY:    No results found.    IMPRESSION:  Caleb Robinson is a 69 y.o. male with locally advanced oral cavity SCCa s/p resection with ECE.      PLAN:    Adjuvant chemo/RT is recommended given pathologic findings. The pt's KPS is poor and there is concern he will tolerate treatment.  Moreover, with exposed hardware, will discuss with Dr. Elana Alm whether it's ok to proceed with RT.    Osa Craver, MD   November 23, 2020

## 2020-11-22 NOTE — Progress Notes (Signed)
This is the Subsequent Medicare Annual Wellness Exam, performed 12 months or more after the Initial AWV or the last Subsequent AWV    I have reviewed the patient's medical history in detail and updated the computerized patient record.     69 y/o male long history of smoking, COPD and previous history of hypertension.  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  He has had postop follow-up at [facility] on 11/17/2020.  They plan to start chemotherapy and radiation in the next few weeks.    He is feeding through a G-tube.  Weight is relatively stable over the last several months.    Has good support at home.  Getting home health, home PT, OT, speech therapy.    He was in the emergency department recently for blood pressure as low as 78/40.  He has since stopped blood pressure medicine (amlodipine).    He does decline the Covid vaccine and flu vaccine at this time.    ??      Assessment/Plan   Education and counseling provided:  Are appropriate based on today's review and evaluation    Diagnoses and all orders for this visit:    1. Medicare annual wellness visit, subsequent    2. Tongue cancer ([place])    3. Centrilobular emphysema ([place])    4. History of tobacco abuse      Plan:  He should continue his Crestor.  Consider the Covid vaccine in the future.  Follow-up with [place] in 6 months, sooner if needed.      Depression Risk Factor Screening     3 most recent PHQ Screens 11/22/2020   Little interest or pleasure in doing things Not at all   Feeling down, depressed, irritable, or hopeless Not at all   Total Score PHQ 2 0       Alcohol Risk Screen    Do you average more than 1 drink per night or more than 7 drinks a week: No    In the past three months have you have had more than 4 drinks containing alcohol on one occasion: No        Functional Ability and Level of Safety    Hearing: Hearing is good.      Activities of Daily Living:  The home contains: no safety  equipment.  Patient does total self care      Ambulation: with no difficulty     Fall Risk:  Fall Risk Assessment, last 12 mths 11/22/2020   Able to walk? Yes   Fall in past 12 months? 0   Do you feel unsteady? -   Are you worried about falling -      Abuse Screen:  Patient is not abused       Cognitive Screening    Has your family/caregiver stated any concerns about your memory: no     Cognitive Screening: Normal - Mini Cog Test, normal clock, words 3/3    Health Maintenance Due     Health Maintenance Due   Topic Date Due   ??? Hepatitis C Screening  Never done   ??? COVID-19 Vaccine (1) Never done   ??? DTaP/Tdap/Td series (1 - Tdap) Never done   ??? Colorectal Cancer Screening Combo  Never done   ??? Shingrix Vaccine Age 1> (1 of 2) Never done   ??? Low dose CT lung screening  Never done   ???  AAA Screening 55-69 YO Male Smoking Patients  Never done   ??? Pneumococcal 65+ years (1 of 1 - PPSV23) Never done   ??? Lipid Screen  06/08/2020   ??? Flu Vaccine (1) 08/09/2020   ??? Medicare Yearly Exam  11/23/2020       Patient Care Team   Patient Care Team:  [name], MD as PCP - General (Family Medicine)  [name], MD as PCP - [facility] Empaneled Provider  Lonzo Cloud, DO as Physician (Otolaryngology)    History     Patient Active Problem List   Diagnosis Code   ??? Emphysema lung ([place]) J43.9   ??? Vision loss H54.7   ??? Tongue cancer ([place]) C02.9   ??? History of tobacco abuse Z87.891   ??? 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 Unstable angina (HCC) I20.0   ??? Centrilobular emphysema (HCC) J43.2     Past Medical History:   Diagnosis Date   ??? Chronic obstructive pulmonary disease ([place])     follows with Pulmonology.  daily and rescure inhaler. no home O2.   ??? Emphysema lung ([place])    ??? HTN (hypertension)       Past Surgical History:   Procedure Laterality Date   ??? HX HERNIA REPAIR  05/2019     Current Outpatient Medications   Medication Sig Dispense Refill   ??? ibuprofen (MOTRIN) 800 mg tablet Take 1 tablet by mouth twice daily as needed for  pain 60 Tablet 0   ??? rosuvastatin (CRESTOR) 10 mg tablet Take 1 tablet by mouth nightly 90 Tablet 0   ??? Trelegy Ellipta 100-62.5-25 mcg inhaler Take 1 Puff by inhalation daily. 30 Each 3   ??? multivitamin (ONE A DAY) tablet Take 1 Tablet by mouth daily.     ??? melatonin 10 mg tab Take  by mouth. Take at night     ??? cholecalciferol (Vitamin D3) 25 mcg (1,000 unit) cap Take  by mouth daily.     ??? gabapentin (NEURONTIN) 100 mg capsule Take 2 Capsules by mouth three (3) times daily as needed for Pain (Restless leg). 360 Capsule 3   ??? amLODIPine (NORVASC) 5 mg tablet Take 1 Tablet by mouth daily. 90 Tablet 3   ??? albuterol (PROVENTIL HFA, VENTOLIN HFA, PROAIR HFA) 90 mcg/actuation inhaler Take 2 Puffs by inhalation every six (6) hours as needed for Wheezing. 1 Inhaler 6   ??? aspirin delayed-release 81 mg tablet Take 1 Tab by mouth daily. 90 Tab 3   ??? albuterol (PROVENTIL VENTOLIN) 2.5 mg /3 mL (0.083 %) nebu 3 mL by Nebulization route every six (6) hours as needed for Wheezing. One premix vial every 6hrs prn. 30 Nebule 5     No Known Allergies    Family History   Problem Relation Age of Onset   ??? Dementia Mother      Social History     Tobacco Use   ??? Smoking status: Current Every Day Smoker     Packs/day: 0.75     Years: 45.00     Pack years: 33.75   ??? Smokeless tobacco: Never Used   ??? Tobacco comment: stopped but restarted Dec 2019   Substance Use Topics   ??? Alcohol use: Yes     Alcohol/week: 1.0 standard drink     Types: 1 Cans of beer per week     Comment: occ

## 2020-11-22 NOTE — Progress Notes (Signed)
Pt is here today for the initial RT consult with Dr. Nance Pew for [facility] P16- tongue cancer, also the floor of his mouth.  Pt is s/p a radical neck dissection with tongue excision and mandibular reconstruction on 09/27/20 by Dr. Elana Alm at [facility].  Pt has metal hardware protruding from his lower gum/mandible.  Pt appears very thin and frail at 92 lbs.  He stated that he is using his G Tube for feedings and is followed by Lynnell Chad, RD, and Dr. Sonny Masters in [place].  Pt stated that he can't swallow so his biggest concern is if he has N/V from chemo/RT. He goes to Speech Therapy.  Pt stated that he has started smoking again, and he does reek of cigarette smoke. An overview of RT was given.

## 2020-11-24 NOTE — Telephone Encounter (Signed)
Left message for pt to call me back need to scheuled to see Dr. Nance Pew per req of Sonny Masters

## 2020-11-28 ENCOUNTER — Inpatient Hospital Stay: Admit: 2020-11-28 | Payer: MEDICARE | Primary: Family Medicine

## 2020-11-28 ENCOUNTER — Inpatient Hospital Stay: Admit: 2020-11-28 | Payer: MEDICARE | Attending: Radiation Oncology | Primary: Family Medicine

## 2020-11-28 DIAGNOSIS — Z510 Encounter for antineoplastic radiation therapy: Secondary | ICD-10-CM

## 2020-11-28 NOTE — Progress Notes (Signed)
Consents signed today for RT.

## 2020-11-28 NOTE — Progress Notes (Signed)
Patient: Caleb Robinson MRN: [id number]  SSN: [id number]    Date of Birth: 1951-05-09  Age: 69 y.o.  Sex: male      Other Providers:  Dr. Sonny Masters and Dr. Elana Alm and Dr. Elby Beck    DIAGNOSIS: pT3N3b floor of mouth SCCa    PREVIOUS TREATMENT:  1) 09/27/2020: Resection of floor of mouth tumor with reconstruction    INTERVAL HISTORY:  Caleb Robinson is a 69 y.o. male is here for f/u to finalize treatment plan. No new symptoms.    UPDATED PAST MEDICAL HISTORY:  Since our prior encounter, Caleb Robinson has not been hospitalized.  There have been no significant changes to the medical history.     MEDICATIONS:     Current Outpatient Medications:   ???  ibuprofen (MOTRIN) 800 mg tablet, Take 1 tablet by mouth twice daily as needed for pain, Disp: 60 Tablet, Rfl: 0  ???  rosuvastatin (CRESTOR) 10 mg tablet, Take 1 tablet by mouth nightly, Disp: 90 Tablet, Rfl: 0  ???  Trelegy Ellipta 100-62.5-25 mcg inhaler, Take 1 Puff by inhalation daily., Disp: 30 Each, Rfl: 3  ???  multivitamin (ONE A DAY) tablet, Take 1 Tablet by mouth daily., Disp: , Rfl:   ???  melatonin 10 mg tab, Take  by mouth. Take at night, Disp: , Rfl:   ???  cholecalciferol (Vitamin D3) 25 mcg (1,000 unit) cap, Take  by mouth daily., Disp: , Rfl:   ???  gabapentin (NEURONTIN) 100 mg capsule, Take 2 Capsules by mouth three (3) times daily as needed for Pain (Restless leg)., Disp: 360 Capsule, Rfl: 3  ???  albuterol (PROVENTIL HFA, VENTOLIN HFA, PROAIR HFA) 90 mcg/actuation inhaler, Take 2 Puffs by inhalation every six (6) hours as needed for Wheezing., Disp: 1 Inhaler, Rfl: 6  ???  albuterol (PROVENTIL VENTOLIN) 2.5 mg /3 mL (0.083 %) nebu, 3 mL by Nebulization route every six (6) hours as needed for Wheezing. One premix vial every 6hrs prn., Disp: 30 Nebule, Rfl: 5    ALLERGIES:   No Known Allergies    PHYSICAL EXAMINATION:   ECOG Performance status 1  VITAL SIGNS:   Visit Vitals  BP (!) 117/53   Pulse 77   Temp 97 ??F (36.1 ??C)   Wt 40.7 kg (89 lb 12.8  oz)   SpO2 96%   BMI 15.41 kg/m??      General: thin adult Male in no acute distress; appears stated age  21: normocephalic, atraumatic; EOMI; jaw reconstruction showing through mendibular gumline  Neck: supple with full ROM; neck dissection scar well-healed  Respiratory: normal inspiratory effort, no audible wheezes  Extremities: no cyanosis, clubbing, or edema  Musculoskeletal: mobility intact x4; normal ROM in all joints  Skin: no skin lesions identified  Neuro: AOx3; sensation intact x 4; CNII-XII grossly intact  Psych: appropriate affect, insight, and judgement  GI: abdomen soft, non-distended    LABORATORY:   Lab Results   Component Value Date/Time    Sodium 140 11/20/2020 10:33 AM    Potassium 4.2 11/20/2020 10:33 AM    Chloride 106 11/20/2020 10:33 AM    CO2 30 11/20/2020 10:33 AM    Anion gap 4 (L) 11/20/2020 10:33 AM    Glucose 90 11/20/2020 10:33 AM    BUN 32 (H) 11/20/2020 10:33 AM    Creatinine 1.00 11/20/2020 10:33 AM    GFR est AA >60 11/20/2020 10:33 AM    GFR est non-AA >60 11/20/2020 10:33 AM  Calcium 9.6 11/20/2020 10:33 AM    Magnesium 2.3 11/20/2020 10:33 AM    Albumin 3.0 (L) 11/20/2020 10:33 AM    Protein, total 6.7 11/20/2020 10:33 AM    Globulin 3.7 (H) 11/20/2020 10:33 AM    A-G Ratio 0.8 (L) 11/20/2020 10:33 AM    ALT (SGPT) 31 11/20/2020 10:33 AM     Lab Results   Component Value Date/Time    WBC 9.6 11/20/2020 10:33 AM    HGB 10.5 (L) 11/20/2020 10:33 AM    HCT 34.5 11/20/2020 10:33 AM    PLATELET 405 11/20/2020 10:33 AM       RADIOLOGY:  No results found.    IMPRESSION:  Caleb Robinson is a 69 y.o. 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    PLAN:    I discussed his case with Dr. Henrene Hawking and he doesn't think that the pt can tolerate chemo. Thus, will proceed today with simulation with plans to treat the operative bed to 6000 cGy in 30 fractions without chemo.    Portions of this note were copied from prior encounters and reviewed for accuracy, currency,  and represent documentation and tasks completed during this encounter.  I verify and attest these portions to be unchanged from prior visits.      Federico Flake, MD  11/28/20

## 2020-11-29 ENCOUNTER — Inpatient Hospital Stay: Admit: 2020-11-29 | Payer: MEDICARE | Primary: Family Medicine

## 2020-11-30 ENCOUNTER — Inpatient Hospital Stay: Admit: 2020-11-30 | Payer: MEDICARE | Primary: Family Medicine

## 2020-12-07 NOTE — Telephone Encounter (Signed)
Will Dr. Elby Beck sign [place] Orders for [facility] at [facility] for Mass on Neck (Sept or Oct).     Please call:  [facility] [phone]  Nursing/Physical & Speech Therapy

## 2020-12-07 NOTE — Telephone Encounter (Signed)
I spoke with Bridgette at [facility] , she will fax over [place] orders.

## 2020-12-11 ENCOUNTER — Encounter

## 2020-12-12 ENCOUNTER — Ambulatory Visit: Attending: Hematology & Oncology | Primary: Family Medicine

## 2020-12-12 ENCOUNTER — Ambulatory Visit: Primary: Family Medicine

## 2020-12-12 ENCOUNTER — Ambulatory Visit: Admit: 2020-12-12 | Discharge: 2020-12-12 | Payer: MEDICARE | Primary: Family Medicine

## 2020-12-12 ENCOUNTER — Inpatient Hospital Stay: Admit: 2020-12-12 | Payer: MEDICARE | Primary: Family Medicine

## 2020-12-12 ENCOUNTER — Ambulatory Visit
Admit: 2020-12-12 | Discharge: 2020-12-12 | Payer: MEDICARE | Attending: Hematology & Oncology | Primary: Family Medicine

## 2020-12-12 DIAGNOSIS — Z008 Encounter for other general examination: Secondary | ICD-10-CM

## 2020-12-12 DIAGNOSIS — C069 Malignant neoplasm of mouth, unspecified: Secondary | ICD-10-CM

## 2020-12-12 LAB — COMPREHENSIVE METABOLIC PANEL
ALT: 82 U/L — ABNORMAL HIGH (ref 12–65)
AST: 63 U/L — ABNORMAL HIGH (ref 15–37)
Albumin/Globulin Ratio: 1.1 — ABNORMAL LOW (ref 1.2–3.5)
Albumin: 3.6 g/dL (ref 3.2–4.6)
Alkaline Phosphatase: 118 U/L (ref 50–136)
Anion Gap: 9 mmol/L (ref 7–16)
BUN: 41 MG/DL — ABNORMAL HIGH (ref 8–23)
CO2: 23 mmol/L (ref 21–32)
Calcium: 9.6 MG/DL (ref 8.3–10.4)
Chloride: 110 mmol/L — ABNORMAL HIGH (ref 98–107)
Creatinine: 0.9 MG/DL (ref 0.8–1.5)
EGFR IF NonAfrican American: >60 mL/min/{1.73_m2} (ref >60)
GFR African American: >60 mL/min/{1.73_m2} (ref >60)
Globulin: 3.2 g/dL (ref 2.3–3.5)
Glucose: 77 mg/dL (ref 65–100)
Potassium: 5.4 mmol/L — ABNORMAL HIGH (ref 3.5–5.1)
Sodium: 142 mmol/L (ref 136–145)
Total Bilirubin: 0.3 MG/DL (ref 0.2–1.1)
Total Protein: 6.8 g/dL (ref 6.3–8.2)

## 2020-12-12 LAB — CBC WITH AUTO DIFFERENTIAL
Basophils %: 2 % (ref 0.0–2.0)
Basophils Absolute: 0.1 10*3/uL (ref 0.0–0.2)
Eosinophils %: 1 % (ref 0.5–7.8)
Eosinophils Absolute: 0.1 10*3/uL (ref 0.0–0.8)
Granulocyte Absolute Count: 0 10*3/uL (ref 0.0–0.5)
Hematocrit: 41.4 %
Hemoglobin: 12.5 g/dL — ABNORMAL LOW (ref 13.6–17.2)
Immature Granulocytes: 0 % (ref 0.0–5.0)
Lymphocytes %: 30 % (ref 13–44)
Lymphocytes Absolute: 1.7 10*3/uL (ref 0.5–4.6)
MCH: 30.2 PG (ref 26.1–32.9)
MCHC: 30.2 g/dL — ABNORMAL LOW (ref 31.4–35.0)
MCV: 100 FL — ABNORMAL HIGH (ref 79.6–97.8)
MPV: 12.4 FL — ABNORMAL HIGH (ref 9.4–12.3)
Monocytes %: 9 % (ref 4.0–12.0)
Monocytes Absolute: 0.5 10*3/uL (ref 0.1–1.3)
NRBC Absolute: 0 10*3/uL (ref 0.0–0.2)
Neutrophils %: 58 % (ref 43–78)
Neutrophils Absolute: 3.3 10*3/uL (ref 1.7–8.2)
Platelets: 153 10*3/uL (ref 150–450)
RBC: 4.14 M/uL — ABNORMAL LOW (ref 4.23–5.6)
RDW: 15.3 % — ABNORMAL HIGH (ref 11.9–14.6)
WBC: 5.7 10*3/uL (ref 4.3–11.1)

## 2020-12-12 LAB — MAGNESIUM
Magnesium: 2.8 mg/dL — ABNORMAL HIGH (ref 1.8–2.4)
Magnesium: 2.8 mg/dL — ABNORMAL HIGH (ref 1.8–2.4)

## 2020-12-12 LAB — CBC WITH AUTOMATED DIFF
ABS. BASOPHILS: 0.1 10*3/uL (ref 0.0–0.2)
ABS. EOSINOPHILS: 0.1 10*3/uL (ref 0.0–0.8)
ABS. IMM. GRANS.: 0 10*3/uL (ref 0.0–0.5)
ABS. LYMPHOCYTES: 1.7 10*3/uL (ref 0.5–4.6)
ABS. MONOCYTES: 0.5 10*3/uL (ref 0.1–1.3)
ABS. NEUTROPHILS: 3.3 10*3/uL (ref 1.7–8.2)
ABSOLUTE NRBC: 0 10*3/uL (ref 0.0–0.2)
BASOPHILS: 2 % (ref 0.0–2.0)
EOSINOPHILS: 1 % (ref 0.5–7.8)
HCT: 41.4 %
HGB: 12.5 g/dL — ABNORMAL LOW (ref 13.6–17.2)
IMMATURE GRANULOCYTES: 0 % (ref 0.0–5.0)
LYMPHOCYTES: 30 % (ref 13–44)
MCH: 30.2 PG (ref 26.1–32.9)
MCHC: 30.2 g/dL — ABNORMAL LOW (ref 31.4–35.0)
MCV: 100 FL — ABNORMAL HIGH (ref 79.6–97.8)
MONOCYTES: 9 % (ref 4.0–12.0)
MPV: 12.4 FL — ABNORMAL HIGH (ref 9.4–12.3)
NEUTROPHILS: 58 % (ref 43–78)
PLATELET: 153 10*3/uL (ref 150–450)
RBC: 4.14 M/uL — ABNORMAL LOW (ref 4.23–5.6)
RDW: 15.3 % — ABNORMAL HIGH (ref 11.9–14.6)
WBC: 5.7 10*3/uL (ref 4.3–11.1)

## 2020-12-12 LAB — METABOLIC PANEL, COMPREHENSIVE
A-G Ratio: 1.1 — ABNORMAL LOW (ref 1.2–3.5)
ALT (SGPT): 82 U/L — ABNORMAL HIGH (ref 12–65)
AST (SGOT): 63 U/L — ABNORMAL HIGH (ref 15–37)
Albumin: 3.6 g/dL (ref 3.2–4.6)
Alk. phosphatase: 118 U/L (ref 50–136)
Anion gap: 9 mmol/L (ref 7–16)
BUN: 41 MG/DL — ABNORMAL HIGH (ref 8–23)
Bilirubin, total: 0.3 MG/DL (ref 0.2–1.1)
CO2: 23 mmol/L (ref 21–32)
Calcium: 9.6 MG/DL (ref 8.3–10.4)
Chloride: 110 mmol/L — ABNORMAL HIGH (ref 98–107)
Creatinine: 0.9 MG/DL (ref 0.8–1.5)
GFR est AA: >60 mL/min/{1.73_m2} (ref >60)
GFR est non-AA: >60 mL/min/{1.73_m2} (ref >60)
Globulin: 3.2 g/dL (ref 2.3–3.5)
Glucose: 77 mg/dL (ref 65–100)
Potassium: 5.4 mmol/L — ABNORMAL HIGH (ref 3.5–5.1)
Protein, total: 6.8 g/dL (ref 6.3–8.2)
Sodium: 142 mmol/L (ref 136–145)

## 2020-12-12 MED ORDER — OXYCODONE 5 MG/5 ML ORAL SOLN
5 mg/ mL | Freq: Four times a day (QID) | ORAL | 0 refills | Status: AC | PRN
Start: 2020-12-12 — End: 2020-12-26

## 2020-12-12 NOTE — Progress Notes (Signed)
Progress Notes by Rayann Heman, MD at 12/12/20 1400                Author: Rayann Heman, MD  Service: --  Author Type: Physician       Filed: 12/12/20 1846  Encounter Date: 12/12/2020  Status: Signed          Editor: Rayann Heman, MD (Physician)                          [place] VISIT         Patient Name: Caleb Robinson              Date of Visit: 12/12/2020   DOB: 07-29-1951   Age:70 y.o.                        Presenting Complaint:   Caleb Robinson  is seen in follow-up for a  tongue cancer.      History of Present Illness:   Caleb Robinson was seen for the first time in our office in July 2021.  He was a gentleman with a rather substantial alcohol and tobacco history.  He has smoked upwards of 2-1/2 packs/day during the  course of his life but now is down to less than a pack.  He indicated that his alcohol use was substantial prior to 9 years ago when his intake decreased significant after the death of his wife.  In 19-Nov-2019 he first noticed some tongue discomfort  associated with weight loss.  By May 2021 his discomfort had significantly increased and he sought medical attention.  CT scanning of his neck on May 26 showed right greater than left submandibular gland enlargement with an indeterminate lymph node at  level 2 on the right he was seen by Dr. Elby Beck on May 28.  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   The remainder of the laryngoscopy was negative.  A subsequent PET scan was performed notable for FDG uptake within the ventral tongue.  There was an FDG avid right level 2A lymph node concerning for metastasis.  There was faint FDG uptake associated  with nodules noted in the left lung 1 of which was new from a CT on June 23.  A previously seen right lung nodule was not FDG avid.  On October 20, he underwent excision of most of the tongue floor of the mouth and jaw with a radical neck dissection.   He had a transient tracheostomy and a G-tube was placed.  The pathology report is not visible in Care Everywhere at this time.  However, Dr. Elana Alm in his notes, described the tumor as [id number] clinically.     He returns today for follow-up. After his last visit, he was seen by Dr. Nance Pew in consultation. After our discussion, a decision was made to treat the patient with radiation therapy alone. My concern is that the addition of any chemotherapy may make this  whole process intolerable to him and result in the suspension of treatment midway. In that circumstance, it would be difficult for him to get full advantage of either radiation or chemotherapy. I would prefer that he be able to get a full course of radiation  therapy without increasing the risk that treatment would have  to be suspended. That having been said, the patient is scheduled to begin on January 10. He is scheduled to be seen at [facility] on January 07. At that point hopefully  we can get a final clearance from his surgical team to go ahead with treatment. Caleb Robinson indicates that he believes his graft is even more exposed now. He has some pain in the jaw for which she is asking for some additional oxycodone. He is feeding  himself through his G-tube but is not gaining much weight. He has had no fever or night sweats.   Medications:      Current Outpatient Medications          Medication  Sig  Dispense  Refill           ?  oxyCODONE (ROXICODONE) 5 mg/5 mL solution  Take 10 mL by mouth every six (6) hours as needed for Pain for up to 14 days. Max Daily Amount: 40 mg.  473 mL  0           ?  ibuprofen (MOTRIN) 800 mg tablet  Take 1 tablet by mouth twice daily as needed for pain  60 Tablet  0     ?  rosuvastatin (CRESTOR) 10 mg tablet  Take 1 tablet by mouth nightly  90 Tablet  0     ?  Trelegy Ellipta  100-62.5-25 mcg inhaler  Take 1 Puff by inhalation daily.  30 Each  3     ?  multivitamin (ONE A DAY) tablet  Take 1 Tablet by mouth daily.         ?  melatonin 10 mg tab  Take  by mouth. Take at night         ?  cholecalciferol (Vitamin D3) 25 mcg (1,000 unit) cap  Take  by mouth daily.         ?  gabapentin (NEURONTIN) 100 mg capsule  Take 2 Capsules by mouth three (3) times daily as needed for Pain (Restless leg).  360 Capsule  3     ?  albuterol (PROVENTIL HFA, VENTOLIN HFA, PROAIR HFA) 90 mcg/actuation inhaler  Take 2 Puffs by inhalation every six (6) hours as needed for Wheezing.  1 Inhaler  6     ?  albuterol (PROVENTIL VENTOLIN) 2.5 mg /3 mL (0.083 %) nebu  3 mL by Nebulization route every six (6) hours as needed for Wheezing. One premix vial every 6hrs prn.  30 Nebule  5           ?  OTHER  daily.               Allergies:     Allergies        Allergen  Reactions         ?  Cat Dander  Swelling           Review of Systems:   The Review of Systems is documented in full in the internal medical record. All systems are negative other than for those noted above.       Past Medical History:     Past Medical History:        Diagnosis  Date         ?  Chronic obstructive pulmonary disease ([place])            follows with Pulmonology.  daily and rescure inhaler. no home O2.         ?  Emphysema lung ([place])           ?  HTN (hypertension)             Past Surgical History:     Past Surgical History:         Procedure  Laterality  Date          ?  HX HERNIA REPAIR    05/2019           Social History:     Social History          Tobacco Use         ?  Smoking status:  Current Every Day Smoker              Packs/day:  0.75         Years:  45.00         Pack years:  33.75         ?  Smokeless tobacco:  Never Used        ?  Tobacco comment: stopped but restarted Dec 2019       Vaping Use         ?  Vaping Use:  Never used       Substance Use Topics         ?  Alcohol use:  Yes              Alcohol/week:  1.0 standard drink          Types:  1 Cans of beer per week             Comment: occ         ?  Drug use:  Never           Family History:     Family History         Problem  Relation  Age of Onset          ?  Dementia  Mother                Physical Examination:   General Appearance: Thin chronically ill-appearing patient in no acute distress.    Vital signs:    Visit Vitals      BP  132/68 (BP 1 Location: Left arm)     Pulse  70     Temp  97.2 ??F (36.2 ??C) (Axillary)     Wt  90 lb 14.4 oz (41.2 kg)     SpO2  95%        BMI  15.60 kg/m??         Performance Status: ECOG Level  2   Distress Screening Score:      3 most recent PHQ Screens  12/12/2020        Little interest or pleasure in doing things  Not at all     Feeling down, depressed, irritable, or hopeless  Not at all        Total Score PHQ 2  0        Pain Scale: 6 /10      HEENT: He has lost virtually all of his tongue and has a muscle flap lining the floor of the mouth. Most of the crusted areas of healed. Most notably however there is evident metal from the mandibular prosthesis that is evident in areas where the mandibular  mucosa has not covered it. In addition, the fibular autograft is more fully visible posteriorly as well.  Neck: Extensive surgery evident   Lymph nodes: There is extensive surgery in the neck but no nodes are palpable. He complains of firmness in the right submandibular region. It is hard to discern whether there is any tumor in this location.   Lungs: The lungs are clear to auscultation and percussion. There is no egophony. There is no chest wall tenderness and no  use of accessory respiratory musculature.   Heart: There is no jugular venous distention. The rate is normal and rhythm regular. The S1 and S2 are normal and there are no murmurs or rubs.     Abdomen: Soft, non-tender, bowel sounds present and normal, no appreciated hepatosplenomegaly. No palpable masses.  G-tube in place.  Skin: No rash, petechiae or ecchymoses. No evidence of malignancy.     Extremities: No cyanosis, clubbing or edema.          Labs/Imaging:     Lab Results         Component  Value  Date/Time            WBC  5.7  12/12/2020 02:03 PM       HGB  12.5 (L)  12/12/2020 02:03 PM       HCT  41.4  12/12/2020 02:03 PM       PLATELET  153  12/12/2020 02:03 PM            MCV  100.0 (H)  12/12/2020 02:03 PM             Lab Results         Component  Value  Date/Time            Sodium  142  12/12/2020 02:03 PM       Potassium  5.4 (H)  12/12/2020 02:03 PM       Chloride  110 (H)  12/12/2020 02:03 PM       CO2  23  12/12/2020 02:03 PM       Anion gap  9  12/12/2020 02:03 PM       Glucose  77  12/12/2020 02:03 PM       BUN  41 (H)  12/12/2020 02:03 PM       Creatinine  0.90  12/12/2020 02:03 PM       GFR est AA  >60  12/12/2020 02:03 PM       GFR est non-AA  >60  12/12/2020 02:03 PM       Calcium  9.6  12/12/2020 02:03 PM       Alk. phosphatase  118  12/12/2020 02:03 PM       Protein, total  6.8  12/12/2020 02:03 PM       Albumin  3.6  12/12/2020 02:03 PM       Globulin  3.2  12/12/2020 02:03 PM       A-G Ratio  1.1 (L)  12/12/2020 02:03 PM            ALT (SGPT)  82 (H)  12/12/2020 02:03 PM           Above results reviewed with patient.             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 He is certainly a candidate for radiation therapy and perhaps chemo and  radiation therapy as well. Nevertheless, I believe his functional level would only tolerate one of those modalities  and it should be radiation therapy in this situation. He is scheduled to start on January 10. I do have ongoing concerns about the status  of the graft and the reconstruction of his mandible. Prior to initiating any therapy I am thankful that he is being seen by his surgeons before initiating any treatment.       PLAN:   I have refilled his liquid oxycodone given the pain he is having in his jaw. For now, as above, he is scheduled  to start radiation on January 10 but will be seen by his ENT surgeons on January 07. The  patient will be seen by our nutritional and palliative care staff in addition to radiation oncology. I would be happy to see him as needed during the course of his treatment.            RESUSCITATION DIRECTIVES/HOSPICE CARE;   Full Support          Rayann Heman MD FACP      Oncology and Hematology Program Director   [facility] [place] [address]   P [phone]   F [phone]   [email or web address]            Elements of this note have been dictated using speech recognition software. As a result, errors of speech recognition may have occurred.

## 2020-12-12 NOTE — Progress Notes (Signed)
Nutrition F/U:  Assessment:  Pt seen during office visit w/ Dr. Sonny Masters, plan to start adjuvant radiation on (1/10), does have follow up at [facility] on (1/7) w/ Dr. Elana Alm.  Plan for pt to complete 6 weeks of radiation (30 fractions).  Pt remains NPO/TF dependent for estimated nutrition needs, bolus feeding 4 cartons of Nutren 1.5 (8 am, 12 pm, 4 pm, 8 pm) + 1 dose of liquid Pro-stat as prescribed by [facility], will start adding 1-3 cans of Ensure Plus or homemade protein shake daily between meals as he is not gaining weight and nutrition needs will increase w/ starting radiation.  Pt c/o persistent mouth pain, taking Motrin (800 mg) BID w/ minimal relief - more pain at night when sleeping, Dr. Sonny Masters prescribing liquid Oxycodone (10 ml = 10 mg).  Labs notable for potassium (5.4), glucose (110), BUN (41), magnesium (2.8), ALT (82), AST (63).  Current BW: 90#, down 2# over the past 3 weeks.     Intervention:  1. NPO, liquids via syringe as tolerated - pt only doing this for dry mouth.  2. Continue w/ current TF regimen, can add Ensure Plus or comparable between meals for extra calories/protein daily.   3. Liquid Oxycodone IR (10 mg = 10 mL) q 6 hrs prn  4. Referral to Palliative Care for symptom management  5. Dr. Elana Alm @ [place] on (1/7)     Monitoring/Evaluation:  1. RD to follow up during next office visit - follow up wt status, tolerance/intake of TF, symptom management.    [place], [place], [place]

## 2020-12-13 ENCOUNTER — Encounter: Payer: MEDICARE | Primary: Family Medicine

## 2020-12-14 ENCOUNTER — Encounter: Payer: MEDICARE | Primary: Family Medicine

## 2020-12-15 ENCOUNTER — Encounter: Payer: MEDICARE | Primary: Family Medicine

## 2020-12-18 ENCOUNTER — Encounter: Payer: MEDICARE | Primary: Family Medicine

## 2020-12-19 ENCOUNTER — Inpatient Hospital Stay: Admit: 2020-12-19 | Payer: MEDICARE | Primary: Family Medicine

## 2020-12-19 DIAGNOSIS — Z510 Encounter for antineoplastic radiation therapy: Secondary | ICD-10-CM

## 2020-12-19 DIAGNOSIS — C029 Malignant neoplasm of tongue, unspecified: Secondary | ICD-10-CM

## 2020-12-19 MED ORDER — LORAZEPAM 0.5 MG TAB
0.5 mg | ORAL_TABLET | Freq: Every day | ORAL | 0 refills | Status: DC
Start: 2020-12-19 — End: 2021-01-30

## 2020-12-20 ENCOUNTER — Encounter: Payer: MEDICARE | Primary: Family Medicine

## 2020-12-21 ENCOUNTER — Encounter

## 2020-12-21 ENCOUNTER — Encounter: Payer: MEDICARE | Primary: Family Medicine

## 2020-12-21 ENCOUNTER — Ambulatory Visit: Payer: MEDICARE | Primary: Family Medicine

## 2020-12-22 ENCOUNTER — Ambulatory Visit: Attending: Adult Health | Primary: Family Medicine

## 2020-12-22 ENCOUNTER — Ambulatory Visit: Primary: Family Medicine

## 2020-12-22 ENCOUNTER — Inpatient Hospital Stay: Admit: 2020-12-22 | Payer: MEDICARE | Primary: Family Medicine

## 2020-12-22 ENCOUNTER — Encounter: Attending: Critical Care Medicine | Primary: Family Medicine

## 2020-12-22 ENCOUNTER — Ambulatory Visit: Admit: 2020-12-22 | Discharge: 2020-12-22 | Payer: MEDICARE | Attending: Adult Health | Primary: Family Medicine

## 2020-12-22 ENCOUNTER — Ambulatory Visit: Admit: 2020-12-22 | Discharge: 2020-12-22 | Payer: MEDICARE | Primary: Family Medicine

## 2020-12-22 DIAGNOSIS — Z008 Encounter for other general examination: Secondary | ICD-10-CM

## 2020-12-22 DIAGNOSIS — G893 Neoplasm related pain (acute) (chronic): Secondary | ICD-10-CM

## 2020-12-22 MED ORDER — IBUPROFEN 800 MG TAB
800 mg | ORAL_TABLET | ORAL | 0 refills | Status: DC
Start: 2020-12-22 — End: 2021-01-11

## 2020-12-22 MED ORDER — SERTRALINE 20 MG/ML ORAL CONCENTRATE
20 mg/mL | Freq: Every day | ORAL | 1 refills | Status: DC
Start: 2020-12-22 — End: 2021-02-06
  Filled 2020-12-22: qty 60, 30d supply, fill #0

## 2020-12-22 MED ORDER — OXYCODONE 10 MG TAB
10 mg | ORAL_TABLET | ORAL | 0 refills | Status: DC | PRN
Start: 2020-12-22 — End: 2021-01-24
  Filled 2020-12-22: qty 90, 15d supply, fill #0

## 2020-12-22 NOTE — Progress Notes (Signed)
Progress  Notes by Osie Bond at 12/22/20 1440                Author: Osie Bond  Service: --  Author Type: --       Filed: 12/22/20 1443  Encounter Date: 12/22/2020  Status: Signed          Editor: Osie Bond               Obtained PA for sertraline (zoloft) 20 mg/ml concentrated solution. PA not required.

## 2020-12-22 NOTE — Progress Notes (Signed)
Nutrition F/U:  Assessment:  Pt seen prior to radiation, was unable to start earlier this week d/t anxiety with the radiation mask, prescribed Ativan (0.5 mg) - can take 1-2 tabs prior to radiation per Palliative Care today, has not started his liquid Oxycodone (10 ml = 10 mg) every 6 hrs - copay is $94; have messaged benefit specialists in regard to prior authorization.  Pt continues to verbalize depression - Zoloft (50 mg) liquid to start taking via PEG, also prescribed Oxycodone pills (10 mg) to take q 6 hrs - possibly cheaper than liquid as pt cannot afford the liquid copay.  No labs obtained today. Current BW: 88#, down 2# over the past 10 days.     Intervention:  1. NPO, water via syringe po as able  2. Will begin SLP through home health next week  3. Continue w/ prescribed TF - try to add extra cartons or Ensure/Boost as "snacks" between feedings.  Increase liquid promod to BID.   4. Liquid Zoloft (50 mg) daily, Oxycodone IR (10 mg) q 6 hrs prn for pain - liquid vs pills    Monitoring/Evaluation:  1. RD to follow up during next office visit - follow up wt status, tolerance/intake of TF, symptom management.    Remigio Eisenmenger RD, CSO, LD  [phone]

## 2020-12-22 NOTE — Progress Notes (Signed)
Outpatient Palliative Care at the  [facility]: Office Visit New Patient H & P    Diagnosis: tongue cancer    Treatment Plan: s/p resection and reconstruction of mandible 09/27/20-->1/30    Treatment Intent: curative    Medical Oncologist: Dr. Henrene Hawking (no chemotherapy)    Radiation Oncologist: Dr. Sudie Bailey    Navigator: Ottis Stain, RD    Surgeon: Dr. Morrie Sheldon [facility])      Chief Complaint:    Chief Complaint   Patient presents with   ??? New Patient     tongue cancer     History of Present Illness:  Mr. Cafaro is a 70 y.o. male who presents today for evaluation regarding pain and symptom management and introduction to palliative medicine in the setting of tongue cancer for which pt has had resection, reconstruction of mandible, and is now to start XRT.  He came for XRT 12/18/20 but was able to tolerate due to mask.  He has not returned this week, but is here today for his first fraction XRT.  He was prescribed 0.5mg  ativan by Dr. Sudie Bailey and took 1 tablet prior to visit.  He was also prescribed liquid oxycodone, 10mg , by Dr. , but has not picked this up due to cost ($92.00).  He also takes ibuprofen 800mg  twice daily.  Is also out of this and is picking it up today.  He is quite anxious regarding XRT and the mask.  He also states he feels as if he is becoming more depressed the longer this goes on.  Does not feel as if he would harm himself or others, but is frustrated that he was actively employed, enjoyed talking with people, and being sociable and now his quality of life is 'in the toilet'.  His ex-wife is with him today.  She is supportive.      Review of Systems:  Review of Systems   Constitutional: Positive for malaise/fatigue and weight loss.   HENT:        Mouth pain   Eyes: Negative.    Respiratory: Negative.    Cardiovascular: Negative.    Gastrointestinal: Negative.    Genitourinary: Negative.    Musculoskeletal: Negative.    Skin: Negative.    Neurological: Negative.    Endo/Heme/Allergies:  Negative.    Psychiatric/Behavioral: Positive for depression. Negative for suicidal ideas. The patient is nervous/anxious.          Allergies   Allergen Reactions   ??? Cat Dander Swelling     Past Medical History:   Diagnosis Date   ??? Chronic obstructive pulmonary disease (HCC)     follows with Pulmonology.  daily and rescure inhaler. no home O2.   ??? Emphysema lung (HCC)    ??? HTN (hypertension)      Past Surgical History:   Procedure Laterality Date   ??? HX HERNIA REPAIR  05/2019     Family History   Problem Relation Age of Onset   ??? Dementia Mother      Social History     Socioeconomic History   ??? Marital status: SINGLE     Spouse name: Not on file   ??? Number of children: Not on file   ??? Years of education: Not on file   ??? Highest education level: Not on file   Occupational History   ??? Occupation: [employer]   Tobacco Use   ??? Smoking status: Current Every Day Smoker     Packs/day: 0.75  Years: 45.00     Pack years: 33.75   ??? Smokeless tobacco: Never Used   ??? Tobacco comment: stopped but restarted Dec 2019   Vaping Use   ??? Vaping Use: Never used   Substance and Sexual Activity   ??? Alcohol use: Yes     Alcohol/week: 1.0 standard drink     Types: 1 Cans of beer per week     Comment: occ   ??? Drug use: Never   ??? Sexual activity: Not on file     Comment: widow   Other Topics Concern   ??? Not on file   Social History Narrative   ??? Not on file     Social Determinants of Health     Financial Resource Strain:    ??? Difficulty of Paying Living Expenses: Not on file   Food Insecurity:    ??? Worried About Running Out of Food in the Last Year: Not on file   ??? Ran Out of Food in the Last Year: Not on file   Transportation Needs:    ??? Lack of Transportation (Medical): Not on file   ??? Lack of Transportation (Non-Medical): Not on file   Physical Activity:    ??? Days of Exercise per Week: Not on file   ??? Minutes of Exercise per Session: Not on file   Stress:    ??? Feeling of Stress : Not on file   Social Connections:    ??? Frequency of  Communication with Friends and Family: Not on file   ??? Frequency of Social Gatherings with Friends and Family: Not on file   ??? Attends Religious Services: Not on file   ??? Active Member of Clubs or Organizations: Not on file   ??? Attends Banker Meetings: Not on file   ??? Marital Status: Not on file   Intimate Partner Violence:    ??? Fear of Current or Ex-Partner: Not on file   ??? Emotionally Abused: Not on file   ??? Physically Abused: Not on file   ??? Sexually Abused: Not on file   Housing Stability:    ??? Unable to Pay for Housing in the Last Year: Not on file   ??? Number of Places Lived in the Last Year: Not on file   ??? Unstable Housing in the Last Year: Not on file     Current Outpatient Medications   Medication Sig Dispense Refill   ??? oxyCODONE IR (ROXICODONE) 10 mg tab immediate release tablet Take 1 Tablet by mouth every four (4) hours as needed for Pain for up to 30 days. Max Daily Amount: 60 mg. 90 Tablet 0   ??? sertraline (Zoloft) 20 mg/mL concentrated solution Take 2.5 mL by mouth daily. Indications: anxiousness associated with depression 60 mL 1   ??? ibuprofen (MOTRIN) 800 mg tablet Take 1 tablet by mouth twice daily as needed for pain 60 Tablet 0   ??? LORazepam (ATIVAN) 0.5 mg tablet Take 1 Tablet by mouth daily. Max Daily Amount: 0.5 mg. 30 Tablet 0   ??? OTHER daily.     ??? oxyCODONE (ROXICODONE) 5 mg/5 mL solution Take 10 mL by mouth every six (6) hours as needed for Pain for up to 14 days. Max Daily Amount: 40 mg. 473 mL 0   ??? rosuvastatin (CRESTOR) 10 mg tablet Take 1 tablet by mouth nightly 90 Tablet 0   ??? Trelegy Ellipta 100-62.5-25 mcg inhaler Take 1 Puff by inhalation daily. 30 Each 3   ???  multivitamin (ONE A DAY) tablet Take 1 Tablet by mouth daily.     ??? melatonin 10 mg tab Take  by mouth. Take at night     ??? cholecalciferol (Vitamin D3) 25 mcg (1,000 unit) cap Take  by mouth daily.     ??? gabapentin (NEURONTIN) 100 mg capsule Take 2 Capsules by mouth three (3) times daily as needed for Pain  (Restless leg). 360 Capsule 3   ??? albuterol (PROVENTIL HFA, VENTOLIN HFA, PROAIR HFA) 90 mcg/actuation inhaler Take 2 Puffs by inhalation every six (6) hours as needed for Wheezing. 1 Inhaler 6   ??? albuterol (PROVENTIL VENTOLIN) 2.5 mg /3 mL (0.083 %) nebu 3 mL by Nebulization route every six (6) hours as needed for Wheezing. One premix vial every 6hrs prn. 30 Nebule 5       OBJECTIVE:  Visit Vitals  BP 100/63 (BP 1 Location: Left upper arm, BP Patient Position: Sitting, BP Cuff Size: Adult)   Pulse 82   Resp 18   Ht 5\' 4"  (1.626 m)   Wt 88 lb 6.4 oz (40.1 kg)   SpO2 98%   BMI 15.17 kg/m??       Physical Exam:  Constitutional: Well developed, thin, frail, cachectic male in no acute distress. Caregiver present.   HEENT: Normocephalic and atraumatic. Speech difficult to understand due to oral surgery.  Pupils are equal, round, and reactive to light. Extraocular muscles are intact.  Sclerae anicteric. Neck supple without JVD.   Lymph node   deferred   Skin Warm and dry.  No bruising and no rash noted.  No erythema.  No pallor.    Respiratory unlabored respiratory effort.    CVS deferred   Abdomen Soft, nontender and nondistended   Neuro Grossly nonfocal with no obvious sensory or motor deficits.   MSK Normal range of motion in general.  No edema and no tenderness.   Psych Appropriate mood and affect.        Labs:  No results found for this or any previous visit (from the past 24 hour(s)).    Imaging:  No results found for this or any previous visit.    ASSESSMENT:    ICD-10-CM ICD-9-CM    1. Cancer related pain  G89.3 338.3 oxyCODONE IR (ROXICODONE) 10 mg tab immediate release tablet   2. 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 Anxiety  F41.9 300.00    4. Encounter for palliative care  Z51.5 V66.7    5. Tongue cancer ([place])  C02.9 141.9      Problem List  Date Reviewed: 2021-01-05          Codes Class Noted    Medicare annual wellness visit, subsequent ICD-10-CM:  Z00.00  ICD-9-CM: V70.0  11/22/2020        Tongue cancer ([place]) ICD-10-CM: C02.9  ICD-9-CM: 141.9  06/27/2020        History of tobacco abuse ICD-10-CM: Z87.891  ICD-9-CM: V15.82  09/28/2019    Overview Signed 06/27/2020  6:01 PM by Rayann Heman, MD     Last Assessment & Plan:   Formatting of this note might be different from the original.  After assessing the patient's willingness to quit, the patient was counseled for 4 minutes on tobacco cessation including the impact of smoking on health, the benefits of quitting, and various options available for quitting including medications, behavioral modifications, and support. The patient has not chosen a quit date.  He meets criteria for lung cancer screening. Will plan for shared decision making and ordering of LDCT for 09/2020 at an upcoming visit.             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  ECHO 06/08/2019:  Interpretation Summary     ?? The left ventricular systolic function is normal (55-65%).  ?? Grade I (mild) left ventricular diastolic dysfunction present, consistent with impaired relaxation.  ?? The right ventricular systolic function is normal.  ?? 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 Mild-to-moderate mitral valve regurgitation. Consider TEE for further evaluation.  ?? Myxomatous tricuspid valve with mild regurgitation.  ?? Normal chamber sizes.  ?? There is moderate pulmonary hypertension.     Mild to moderate MR. Will need ongoing surveillance, no intervention at this time.             Centrilobular emphysema ([place]) ICD-10-CM: J43.2  ICD-9-CM: 492.8  06/09/2019    Overview Signed 06/27/2020  6:01 PM by Rayann Heman, MD     Last Assessment & Plan:   Formatting of this note might be  different from the original.  Very severe obstruction in a current smoker. Smoking cessation strongly encouraged. Will discontinue his Dulera and start Trelegy. Continue PRN SABA. 64mwt on return visit.             Unstable angina (HCC) ICD-10-CM: I20.0  ICD-9-CM: 411.1  06/08/2019    Overview Signed 06/27/2020  6:01 PM by Rayann Heman, MD     Formatting of this note might be different from the original.  Added automatically from request for surgery [id number]             Emphysema lung [facility]) ICD-10-CM: J43.9  ICD-9-CM: 492.8  Unknown                PLAN:  Lab studies and imaging studies were personally reviewed.    Pertinent old records were reviewed from [facility].     Case discussed with XRT, M.Duggan, RD.    1. Pain: pt easily crushes meds routinely.  Will send in Rx for oral tablets of oxycodone 10mg  q 4 hours as liquid is cost prohibitive.  Continue liquid ibuprofen.  Consider liquid gabapentin.    2. Anxiety: started on ativan 0.5mg  by Dr. Nance Pew.  May increase to 2 tablets prior to XRT and if this is ineffective we will try klonopin instead.      3. Depression: discussed the difficult situation pt has been living with since diagnosis.  Will start zoloft 50mg  daily.  He is very agreeable to this.  He denies plan to harm self or others.    Advanced Care Planning Discussed: Explained the role of palliative care as part of pt treatment team.  Pt agreeable to visit.    Will follow up in: 1 week     I have reviewed the patient's controlled substance prescription history, as maintained in the [place] prescription monitoring program, so that the prescriptions(s) for a controlled substance can be given.  Last Date Reviewed: 12/22/20            Fanny Bien, NP  Outpatient Palliative Care at  the  [facility]  [address] [place] [address]  Office : [phone]  Fax : [phone]

## 2020-12-22 NOTE — Progress Notes (Signed)
Obtained PA for oxyCODONE HCl 5MG /5ML solution through [facility], waiting on determination.

## 2020-12-25 ENCOUNTER — Encounter: Payer: MEDICARE | Primary: Family Medicine

## 2020-12-26 ENCOUNTER — Encounter: Payer: MEDICARE | Primary: Family Medicine

## 2020-12-26 ENCOUNTER — Inpatient Hospital Stay: Admit: 2020-12-26 | Payer: MEDICARE | Primary: Family Medicine

## 2020-12-26 NOTE — Progress Notes (Signed)
Patient: Caleb Robinson MRN: [id number]  SSN: [id number]    Date of Birth: January 27, 1951  Age: 70 y.o.  Sex: male      DIAGNOSIS:  pT3N3b floor of mouth SCCa    TREATMENT SITE:  H&N    DOSE and FRACTIONATION:  2 of 30 fractions; 400 cGy of 6000 cGy    INTERVAL HISTORY:  Caleb Robinson is a 70 y.o. male being treated for H&N cancer post-op.    Week 1: no complaints     OBJECTIVE:  NAD  Visit Vitals  BP (!) 84/66 (BP 1 Location: Left upper arm, BP Patient Position: Sitting)   Pulse 83   Temp 97.6 ??F (36.4 ??C)   Resp 16   Wt 40.2 kg (88 lb 9.6 oz)   SpO2 98%   BMI 15.21 kg/m??       Lab Results   Component Value Date/Time    Sodium 142 12/12/2020 02:03 PM    Potassium 5.4 (H) 12/12/2020 02:03 PM    Chloride 110 (H) 12/12/2020 02:03 PM    CO2 23 12/12/2020 02:03 PM    Anion gap 9 12/12/2020 02:03 PM    Glucose 77 12/12/2020 02:03 PM    BUN 41 (H) 12/12/2020 02:03 PM    Creatinine 0.90 12/12/2020 02:03 PM    GFR est AA >60 12/12/2020 02:03 PM    GFR est non-AA >60 12/12/2020 02:03 PM    Calcium 9.6 12/12/2020 02:03 PM    Magnesium 2.8 (H) 12/12/2020 02:03 PM    Albumin 3.6 12/12/2020 02:03 PM    Protein, total 6.8 12/12/2020 02:03 PM    Globulin 3.2 12/12/2020 02:03 PM    A-G Ratio 1.1 (L) 12/12/2020 02:03 PM    ALT (SGPT) 82 (H) 12/12/2020 02:03 PM     Lab Results   Component Value Date/Time    WBC 5.7 12/12/2020 02:03 PM    HGB 12.5 (L) 12/12/2020 02:03 PM    HCT 41.4 12/12/2020 02:03 PM    PLATELET 153 12/12/2020 02:03 PM       ASSESSMENT and PLAN:  Caleb Robinson is tolerating radiation as anticipated for the current dose and fraction.  We will continue on as planned with another treatment visit anticipated next week.        Osa Craver, MD   December 27, 2020

## 2020-12-27 ENCOUNTER — Encounter

## 2020-12-27 ENCOUNTER — Inpatient Hospital Stay: Admit: 2020-12-27 | Payer: MEDICARE | Primary: Family Medicine

## 2020-12-28 ENCOUNTER — Ambulatory Visit: Primary: Family Medicine

## 2020-12-28 ENCOUNTER — Telehealth: Attending: Family | Primary: Family Medicine

## 2020-12-28 ENCOUNTER — Ambulatory Visit: Admit: 2020-12-28 | Discharge: 2020-12-28 | Payer: MEDICARE | Primary: Family Medicine

## 2020-12-28 ENCOUNTER — Telehealth: Admit: 2020-12-28 | Discharge: 2020-12-28 | Payer: MEDICARE | Attending: Family | Primary: Family Medicine

## 2020-12-28 ENCOUNTER — Inpatient Hospital Stay: Admit: 2020-12-28 | Payer: MEDICARE | Primary: Family Medicine

## 2020-12-28 DIAGNOSIS — Z008 Encounter for other general examination: Secondary | ICD-10-CM

## 2020-12-28 DIAGNOSIS — C029 Malignant neoplasm of tongue, unspecified: Secondary | ICD-10-CM

## 2020-12-28 NOTE — Progress Notes (Signed)
Discussed patient with Lynnell Chad, RD, who was with patient today following XRT.  He seems to be tolerating course of radiation well.  He is currently taking 1 oxycodone 10mg  tablet per day in addition to two ibuprofen.  His anxiety associated with XRT is well controlled with 0.5mg  daily before treatment.  He has neuropathic pain in his right leg at location of skin graft/wound.  He takes gabapentin 100mg  at bedtime.  Agree with trying this in the morning as well for BID dosing.  Will follow-up with patient in radiation next week.

## 2020-12-28 NOTE — Progress Notes (Signed)
Nutrition F/U:  Assessment:  Pt seen prior to radiation, has completed 4 of 30 radiation fractions.  Pt appears groggy today - reports taking 1 1/2 Ativan as the first pill came out of his PEG.  Pt remains TF dependent for estimated nutrition needs, continues w/ prescribed TF regimen of Nutren 1.5 (4 cartons/day), has increased his liquid promod to BID now, trying to incorporate Ensure/Boost daily in addition to his prescribed tube feedings.  Pt reports taking 1-2 Norco (10 mg) tabs per day and 1-2 doses of Ibuprofen w/ controlled pain, taking Gabapentin (100 mg) once a day - c/o pain in his left leg, this is not a new complaint and same leg as skin graft, follows w/ wound care RN and wound is healing, encouraged to increase his Gabapentin to TID as prescribed, can also take pain meds more frequently prn. Pt is taking Zoloft as prescribed. Current BW: 88# (1/18) and stable over the past week.     Intervention:  1. NPO, water w/ syringe as able, will start working w/ SLP through home health   2. Continue w/ current TF regimen  3. Increase Gabapentin (100 mg) to TID as prescribed. Continue w/ Zoloft, Ativan (0.5 mg) prior to radiation, Norco (10 mg) q 6 hrs prn, Ibuprofen (600-800 mg) q 6 hrs prn    Monitoring/Evaluation:  1. RD to follow up during next office visit - follow up wt status, tolerance/intake of TF, symptom management.    [place], [place], [place]

## 2020-12-29 ENCOUNTER — Inpatient Hospital Stay: Admit: 2020-12-29 | Payer: MEDICARE | Primary: Family Medicine

## 2021-01-01 ENCOUNTER — Inpatient Hospital Stay: Admit: 2021-01-01 | Payer: MEDICARE | Primary: Family Medicine

## 2021-01-02 ENCOUNTER — Inpatient Hospital Stay: Admit: 2021-01-02 | Payer: MEDICARE | Primary: Family Medicine

## 2021-01-02 NOTE — Progress Notes (Signed)
Patient: Caleb Robinson MRN: [id number]  SSN: [id number]    Date of Birth: 1951-09-12  Age: 70 y.o.  Sex: male      DIAGNOSIS:  pT3N3b floor of mouth SCCa    TREATMENT SITE:  H&N    DOSE and FRACTIONATION:  6 of 30 fractions; 1200 cGy of 6000 cGy    INTERVAL HISTORY:  Caleb Robinson is a 70 y.o. male being treated for H&N cancer post-op.    Week 1: no complaints   Week 2: noticing more bone being exposed; no pain    OBJECTIVE:  NAD  Visit Vitals  BP (!) 129/56   Pulse 81   Temp 97 ??F (36.1 ??C)   Wt 39.7 kg (87 lb 9.6 oz)   SpO2 90%   BMI 15.04 kg/m??       Lab Results   Component Value Date/Time    Sodium 142 12/12/2020 02:03 PM    Potassium 5.4 (H) 12/12/2020 02:03 PM    Chloride 110 (H) 12/12/2020 02:03 PM    CO2 23 12/12/2020 02:03 PM    Anion gap 9 12/12/2020 02:03 PM    Glucose 77 12/12/2020 02:03 PM    BUN 41 (H) 12/12/2020 02:03 PM    Creatinine 0.90 12/12/2020 02:03 PM    GFR est AA >60 12/12/2020 02:03 PM    GFR est non-AA >60 12/12/2020 02:03 PM    Calcium 9.6 12/12/2020 02:03 PM    Magnesium 2.8 (H) 12/12/2020 02:03 PM    Albumin 3.6 12/12/2020 02:03 PM    Protein, total 6.8 12/12/2020 02:03 PM    Globulin 3.2 12/12/2020 02:03 PM    A-G Ratio 1.1 (L) 12/12/2020 02:03 PM    ALT (SGPT) 82 (H) 12/12/2020 02:03 PM     Lab Results   Component Value Date/Time    WBC 5.7 12/12/2020 02:03 PM    HGB 12.5 (L) 12/12/2020 02:03 PM    HCT 41.4 12/12/2020 02:03 PM    PLATELET 153 12/12/2020 02:03 PM       ASSESSMENT and PLAN:  Caleb Robinson is tolerating radiation as anticipated for the current dose and fraction.  We will continue on as planned with another treatment visit anticipated next week. Will discuss with surgeon re: more exposed bone.      Osa Craver, MD   January 03, 2021

## 2021-01-03 ENCOUNTER — Inpatient Hospital Stay: Admit: 2021-01-03 | Payer: MEDICARE | Primary: Family Medicine

## 2021-01-04 ENCOUNTER — Ambulatory Visit: Primary: Family Medicine

## 2021-01-04 ENCOUNTER — Ambulatory Visit: Attending: Family | Primary: Family Medicine

## 2021-01-04 ENCOUNTER — Telehealth

## 2021-01-04 ENCOUNTER — Ambulatory Visit: Admit: 2021-01-04 | Discharge: 2021-01-04 | Payer: MEDICARE | Attending: Family | Primary: Family Medicine

## 2021-01-04 ENCOUNTER — Ambulatory Visit: Admit: 2021-01-04 | Discharge: 2021-01-04 | Payer: MEDICARE | Primary: Family Medicine

## 2021-01-04 ENCOUNTER — Inpatient Hospital Stay: Admit: 2021-01-04 | Payer: MEDICARE | Primary: Family Medicine

## 2021-01-04 DIAGNOSIS — S81802A Unspecified open wound, left lower leg, initial encounter: Secondary | ICD-10-CM

## 2021-01-04 DIAGNOSIS — Z008 Encounter for other general examination: Secondary | ICD-10-CM

## 2021-01-04 NOTE — Telephone Encounter (Signed)
Discharged [place] seen for Cancer please call 24/48

## 2021-01-04 NOTE — Progress Notes (Signed)
Outpatient Palliative Care at the  [facility]: Office Visit Established Patient    Diagnosis: tongue cancer    Treatment Plan: s/p resection and reconstruction of mandible 09/27/20-->8/30    Treatment Intent: curative    Medical Oncologist: Dr. Henrene Hawking (no chemotherapy)    Radiation Oncologist: Dr. Sudie Bailey    Navigator: Ottis Stain, RD    Surgeon: Dr. Morrie Sheldon [facility])      Chief Complaint:    Chief Complaint   Patient presents with   ??? Follow-up     History of Present Illness:  Caleb Robinson is a 70 y.o. male who presents today for evaluation regarding pain and symptom management and introduction to palliative medicine in the setting of tongue cancer for which pt has had resection, reconstruction of mandible, and is now to start XRT.  He came for XRT 12/18/20 but was able to tolerate due to mask.  He was prescribed 0.5mg  ativan by Dr. Sudie Bailey and took 1 tablet prior to visit.  He was also prescribed liquid oxycodone, 10mg , by Dr. , but has not picked this up due to cost ($92.00).  He also takes ibuprofen 800mg  twice daily.  Is also out of this and is picking it up today.  He is quite anxious regarding XRT and the mask.  He also states he feels as if he is becoming more depressed the longer this goes on.  Does not feel as if he would harm himself or others, but is frustrated that he was actively employed, enjoyed talking with people, and being sociable and now his quality of life is 'in the toilet'.  His ex-wife is his caregiver.    Interval History:  Patient seen prior to his radiation therapy today.  He comes alone to visit.  He will receive his 8th fraction of XRT.  He has mild pain; he is taking ibuprofen twice a day with occasional oxycodone.  He has mild thick mucus, but is not taking anything for this.  He continues Ativan prior to XRT and Zoloft daily for anxiety/depression.   Patient has left leg pain where graft was taken for his oral surgery.  He has wound with scab with dry dressing in place.   He has an appointment with the wound center in [place] next week.    Review of Systems:  Review of Systems   Constitutional: Positive for malaise/fatigue and weight loss.   HENT:        Mouth pain   Eyes: Negative.    Respiratory: Negative.    Cardiovascular: Negative.    Gastrointestinal: Negative.    Genitourinary: Negative.    Musculoskeletal: Negative.    Skin: Negative.    Neurological: Negative.    Endo/Heme/Allergies: Negative.    Psychiatric/Behavioral: Positive for depression. Negative for suicidal ideas. The patient is nervous/anxious.          Allergies   Allergen Reactions   ??? Cat Dander Swelling     Past Medical History:   Diagnosis Date   ??? Chronic obstructive pulmonary disease (HCC)     follows with Pulmonology.  daily and rescure inhaler. no home O2.   ??? Emphysema lung (HCC)    ??? HTN (hypertension)      Past Surgical History:   Procedure Laterality Date   ??? HX HERNIA REPAIR  05/2019     Family History   Problem Relation Age of Onset   ??? Dementia Mother      Social History     Socioeconomic History   ???  Marital status: SINGLE     Spouse name: Not on file   ??? Number of children: Not on file   ??? Years of education: Not on file   ??? Highest education level: Not on file   Occupational History   ??? Occupation: [employer]   Tobacco Use   ??? Smoking status: Current Every Day Smoker     Packs/day: 0.75     Years: 45.00     Pack years: 33.75   ??? Smokeless tobacco: Never Used   ??? Tobacco comment: stopped but restarted Dec 2019   Vaping Use   ??? Vaping Use: Never used   Substance and Sexual Activity   ??? Alcohol use: Yes     Alcohol/week: 1.0 standard drink     Types: 1 Cans of beer per week     Comment: occ   ??? Drug use: Never   ??? Sexual activity: Not on file     Comment: widow   Other Topics Concern   ??? Not on file   Social History Narrative   ??? Not on file     Social Determinants of Health     Financial Resource Strain:    ??? Difficulty of Paying Living Expenses: Not on file   Food Insecurity:    ??? Worried About  Running Out of Food in the Last Year: Not on file   ??? Ran Out of Food in the Last Year: Not on file   Transportation Needs:    ??? Lack of Transportation (Medical): Not on file   ??? Lack of Transportation (Non-Medical): Not on file   Physical Activity:    ??? Days of Exercise per Week: Not on file   ??? Minutes of Exercise per Session: Not on file   Stress:    ??? Feeling of Stress : Not on file   Social Connections:    ??? Frequency of Communication with Friends and Family: Not on file   ??? Frequency of Social Gatherings with Friends and Family: Not on file   ??? Attends Religious Services: Not on file   ??? Active Member of Clubs or Organizations: Not on file   ??? Attends Archivist Meetings: Not on file   ??? Marital Status: Not on file   Intimate Partner Violence:    ??? Fear of Current or Ex-Partner: Not on file   ??? Emotionally Abused: Not on file   ??? Physically Abused: Not on file   ??? Sexually Abused: Not on file   Housing Stability:    ??? Unable to Pay for Housing in the Last Year: Not on file   ??? Number of Places Lived in the Last Year: Not on file   ??? Unstable Housing in the Last Year: Not on file     Current Outpatient Medications   Medication Sig Dispense Refill   ??? ibuprofen (MOTRIN) 800 mg tablet Take 1 tablet by mouth twice daily as needed for pain 60 Tablet 0   ??? oxyCODONE IR (ROXICODONE) 10 mg tab immediate release tablet Take 1 Tablet by mouth every four (4) hours as needed for Pain for up to 30 days. Max Daily Amount: 60 mg. 90 Tablet 0   ??? sertraline (Zoloft) 20 mg/mL concentrated solution Take 2.5 mL by mouth daily. Indications: anxiousness associated with depression 60 mL 1   ??? LORazepam (ATIVAN) 0.5 mg tablet Take 1 Tablet by mouth daily. Max Daily Amount: 0.5 mg. 30 Tablet 0   ??? OTHER daily.     ???  rosuvastatin (CRESTOR) 10 mg tablet Take 1 tablet by mouth nightly 90 Tablet 0   ??? Trelegy Ellipta 100-62.5-25 mcg inhaler Take 1 Puff by inhalation daily. 30 Each 3   ??? multivitamin (ONE A DAY) tablet Take 1  Tablet by mouth daily.     ??? melatonin 10 mg tab Take  by mouth. Take at night     ??? cholecalciferol (Vitamin D3) 25 mcg (1,000 unit) cap Take  by mouth daily.     ??? gabapentin (NEURONTIN) 100 mg capsule Take 2 Capsules by mouth three (3) times daily as needed for Pain (Restless leg). 360 Capsule 3   ??? albuterol (PROVENTIL HFA, VENTOLIN HFA, PROAIR HFA) 90 mcg/actuation inhaler Take 2 Puffs by inhalation every six (6) hours as needed for Wheezing. 1 Inhaler 6   ??? albuterol (PROVENTIL VENTOLIN) 2.5 mg /3 mL (0.083 %) nebu 3 mL by Nebulization route every six (6) hours as needed for Wheezing. One premix vial every 6hrs prn. 30 Nebule 5       OBJECTIVE:  There were no vitals taken for this visit.    Physical Exam:  Constitutional: Well developed, thin, frail, cachectic male in no acute distress.    HEENT: Normocephalic and atraumatic. Speech difficult to understand due to oral surgery.  Pupils are equal, round, and reactive to light. Extraocular muscles are intact.  Sclerae anicteric. Neck supple without JVD.   Lymph node   deferred   Skin Warm and dry.  No bruising and no rash noted.  No erythema.  No pallor. Wound to left leg, 2.5cm x 7cm.  Eschar with healing tissue underneath.  Dry dressing replaced.   Respiratory unlabored respiratory effort.    CVS deferred   Abdomen Soft, nontender and nondistended   Neuro Grossly nonfocal with no obvious sensory or motor deficits.   MSK Normal range of motion in general.  No edema and no tenderness.   Psych Appropriate mood and affect.        Labs:  No results found for this or any previous visit (from the past 24 hour(s)).    Imaging:  No results found for this or any previous visit.    ASSESSMENT:    ICD-10-CM ICD-9-CM    1. Wound of left lower extremity, initial encounter  S81.802A 894.0 REFERRAL TO WOUND CARE   2. 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 Anxiety and depression  F41.9 300.00     F32.A 311    4. Tongue cancer (HCC)  C02.9 141.9    5. Encounter for palliative  care  Z51.5 V66.7      Problem List  Date Reviewed: 2021-01-27          Codes Class Noted    Tongue cancer [facility]) ICD-10-CM: C02.9  ICD-9-CM: 141.9  06/27/2020        History of tobacco abuse ICD-10-CM: Z87.891  ICD-9-CM: V15.82  09/28/2019    Overview Signed 06/27/2020  6:01 PM by Rayann Heman, MD     Last Assessment & Plan:   Formatting of this note might be different from the original.  After assessing the patient's willingness to quit, the patient was counseled for 4 minutes on tobacco cessation including the impact of smoking on health, the benefits of quitting, and various options available for quitting including medications, behavioral modifications, and support. The patient has not chosen a quit date.    He meets criteria for lung cancer screening. Will plan for shared decision making and  ordering of LDCT for 09/2020 at an upcoming visit.             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  ECHO 06/08/2019:  Interpretation Summary     ?? The left ventricular systolic function is normal (55-65%).  ?? Grade I (mild) left ventricular diastolic dysfunction present, consistent with impaired relaxation.  ?? The right ventricular systolic function is normal.  ?? 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 Mild-to-moderate mitral valve regurgitation. Consider TEE for further evaluation.  ?? Myxomatous tricuspid valve with mild regurgitation.  ?? Normal chamber sizes.  ?? There is moderate pulmonary hypertension.     Mild to moderate MR. Will need ongoing surveillance, no intervention at this time.             Centrilobular emphysema (HCC) ICD-10-CM: J43.2  ICD-9-CM: 492.8  06/09/2019    Overview Signed 06/27/2020  6:01 PM by Sofie Hartigan, MD     Last Assessment  & Plan:   Formatting of this note might be different from the original.  Very severe obstruction in a current smoker. Smoking cessation strongly encouraged. Will discontinue his Dulera and start Trelegy. Continue PRN SABA. on return visit.             Unstable angina (HCC) ICD-10-CM: I20.0  ICD-9-CM: 411.1  06/08/2019    Overview Signed 06/27/2020  6:01 PM by Sofie Hartigan, MD     Formatting of this note might be different from the original.  Added automatically from request for surgery [id number]             Emphysema lung [facility]) ICD-10-CM: J43.9  ICD-9-CM: 492.8  Unknown                PLAN:  Lab studies and imaging studies were personally reviewed.    Pertinent old records were reviewed from [facility].     Case discussed with XRT, M.Duggan, RD.    1. Pain: Continue oxycodone 10mg  q 4 hours as liquid is cost prohibitive.  Continue liquid ibuprofen.  He is currently only taking his gabapentin 100mg  at bedtime; encouraged him to add morning dose to help with left leg pain.    2. Anxiety: Continue Ativan 0.5mg  per Dr. .     3. Depression: Continue Zoloft 50mg  daily.    4. Left leg wound: follow-up with wound center is [place] next week as scheduled.     Advanced Care Planning Discussed: None today.    Will follow up in: 1 week     I have reviewed the patient's controlled substance prescription history, as maintained in the prescription monitoring program, so that the prescriptions(s) for a controlled substance can be given.  Last Date Reviewed: 01/04/21            , NP  Outpatient Palliative Care at the  [facility]  [address]  [place] [facility]  Office : [phone]  Fax : [phone]

## 2021-01-04 NOTE — Progress Notes (Signed)
Nutrition F/U:  Assessment:  Pt seen prior to radiation today, has completed 8 of 30 radiation fractions, tolerating well w/ 0.5 mg Ativan prior to radiation daily.  Pt reports good pain control w/ Ibuprofen (800 mg) BID, and Oxycodone IR (10 mg) 1-2 times per day.  Pt c/o persistent left leg pain, did not increase Gabapentin (100 mg) to 2-3 times per day, skin graft wound examined by NP - pt has appointment w/ wound care clinic at [facility] on (1/31).  Pt c/o diarrhea, using Banana flakes - 2 packs daily w/ not much improvement, encouraged to try taking Imodium (2 after first episode) up to 8 tabs per day, can also try adding Metamucil 1-2x per day vs the banana flakes.  Current BW: ~88#, stable.     Intervention:  1. NPO, fluids by syringe as able  2. Continue w/ current TF regimen.  Add Imodium for diarrhea, try Metamucil in place of banana flakes  3. Continue w/ current pain regimen, increase Gabapentin (100 mg) to 2-3x per day.  4. Wound care appointment (1/31)     Monitoring/Evaluation:  1. RD to follow up during next office visit - follow up wt status, tolerance/intake of TF, symptom management.      [place], [place], [place]

## 2021-01-05 ENCOUNTER — Encounter

## 2021-01-05 ENCOUNTER — Inpatient Hospital Stay: Admit: 2021-01-05 | Payer: MEDICARE | Primary: Family Medicine

## 2021-01-05 MED ORDER — AZITHROMYCIN 500 MG TAB
500 mg | ORAL_TABLET | Freq: Every day | ORAL | 0 refills | Status: DC
Start: 2021-01-05 — End: 2021-01-11

## 2021-01-05 NOTE — Telephone Encounter (Signed)
Care Transitions Initial Follow Up Call    Outreach made within 2 business days of discharge: Yes    Patient: Caleb Robinson Patient DOB: 15-Jun-1951   MRN: [id number]  Reason for Admission: Mouth Cancer Surgery  Discharge Date: 06/02/20       Spoke with: Waynetta Sandy - ex wife     Discharge department/facility: [facility]    TCM Interactive Patient Contact:  Was patient able to fill all prescriptions: Yes  Was patient instructed to bring all medications to the follow-up visit: Yes  Is patient taking all medications as directed in the discharge summary? Yes  Does patient understand their discharge instructions: Yes  Does patient have questions or concerns that need addressed prior to 7-14 day follow up office visit: Yes: Comment: [name] states she is worried that the pt may still have C diff.  He has had diarrhea for 2 weeks. FU appt with Dr Jarold Song 02/06/21     Scheduled appointment with PCP within 7-14 days    Follow Up  Future Appointments   Date Time Provider Department Center   01/05/2021  1:15 PM [facility] RAD ONC EDGE [facility]   01/08/2021  1:15 PM [facility] RAD ONC EDGE GCCROG [facility]   01/09/2021  1:15 PM [facility] RAD ONC EDGE GCCROG [facility]   01/10/2021  1:15 PM [facility] RAD ONC EDGE GCCROG [facility]   01/10/2021  3:00 PM Derrill Kay, MD [facility] PP PP   01/11/2021  1:15 PM [facility] RAD ONC EDGE GCCROG [facility]   01/11/2021  1:30 PM Hoover Browns, NP [facility] PCHO   01/11/2021  1:30 PM Remigio Eisenmenger, RD [facility]   01/12/2021  1:15 PM GCC RAD ONC EDGE GCCROG [facility] GCC   01/15/2021  1:15 PM GCC RAD ONC EDGE GCCROG [facility]   01/16/2021  1:15 PM GCC RAD ONC EDGE GCCROG [facility]   01/17/2021  1:15 PM GCC RAD ONC EDGE GCCROG [facility]   01/18/2021  1:15 PM GCC RAD ONC EDGE GCCROG [facility]   01/18/2021  1:30 PM Hoover Browns, NP [facility] PCHO   01/18/2021  1:30 PM Remigio Eisenmenger, RD [facility]   01/19/2021  1:15 PM GCC RAD ONC EDGE GCCROG [facility] GCC   01/22/2021  1:15 PM GCC RAD ONC EDGE GCCROG [facility] GCC   01/23/2021  1:15 PM GCC RAD ONC EDGE GCCROG [facility]    01/24/2021  1:15 PM GCC RAD ONC EDGE GCCROG [facility] GCC   01/25/2021  1:15 PM GCC RAD ONC EDGE GCCROG G[facility] GCC   01/25/2021  1:30 PM Hoover Browns, NP [facility] PCHO   01/25/2021  1:30 PM Remigio Eisenmenger, RD [facility]   01/26/2021  1:15 PM GCC RAD ONC EDGE GCCROG G[facility] GCC   02/06/2021  4:30 PM Murrell Converse, MD [facility]PA [facility]   05/23/2021  9:45 AM Murrell Converse, MD [facility]       Emmit Alexanders Bloomer

## 2021-01-05 NOTE — Telephone Encounter (Signed)
I spoke to [name] his wife.    Having very loose stools often.  She does not describe excessively watery diarrhea.    He is in the middle of radiation treatment for tongue cancer.    Unfortunately she is not able to get to the office today and get [place] a stool sample.    As it is Friday, I discussed with her that we can do empiric treatment for infectious diarrhea.    Diagnoses and all orders for this visit:    1. Infectious diarrhea  -     azithromycin (ZITHROMAX) 500 mg tab; Take 1 Tablet by mouth daily for 3 days.  -     C DIFFICILE TOXIN A & B BY EIA; Future  -     SHIGA-LIKE TOXIN; Future

## 2021-01-06 ENCOUNTER — Encounter

## 2021-01-07 MED ORDER — ROSUVASTATIN 10 MG TAB
10 mg | ORAL_TABLET | Freq: Every evening | ORAL | 0 refills | Status: DC
Start: 2021-01-07 — End: 2021-02-06

## 2021-01-08 ENCOUNTER — Encounter: Payer: MEDICARE | Primary: Family Medicine

## 2021-01-09 ENCOUNTER — Other Ambulatory Visit: Admit: 2021-01-09 | Discharge: 2021-01-09 | Payer: MEDICARE | Primary: Family Medicine

## 2021-01-09 ENCOUNTER — Inpatient Hospital Stay: Admit: 2021-01-09 | Payer: MEDICARE | Primary: Family Medicine

## 2021-01-09 DIAGNOSIS — A09 Infectious gastroenteritis and colitis, unspecified: Secondary | ICD-10-CM

## 2021-01-09 DIAGNOSIS — Z510 Encounter for antineoplastic radiation therapy: Secondary | ICD-10-CM

## 2021-01-09 NOTE — Progress Notes (Signed)
Pt notified

## 2021-01-09 NOTE — Progress Notes (Signed)
Please let them know that his stool studies showed no sign of C. difficile.  Also, no sign of E. coli. He took antibiotics last week so that may have cleared up some bacteria but definitely no C. difficile was present at any time.

## 2021-01-10 ENCOUNTER — Encounter

## 2021-01-10 ENCOUNTER — Inpatient Hospital Stay: Admit: 2021-01-10 | Payer: MEDICARE | Primary: Family Medicine

## 2021-01-10 ENCOUNTER — Encounter: Payer: MEDICARE | Attending: Critical Care Medicine | Primary: Family Medicine

## 2021-01-10 DIAGNOSIS — K9423 Gastrostomy malfunction: Secondary | ICD-10-CM

## 2021-01-10 MED ORDER — SALINE PERIPHERAL FLUSH PRN
INTRAMUSCULAR | Status: DC | PRN
Start: 2021-01-10 — End: 2021-01-12
  Administered 2021-01-10: 21:00:00

## 2021-01-10 MED ORDER — SODIUM CHLORIDE 0.9 % IV
Freq: Once | INTRAVENOUS | Status: AC
Start: 2021-01-10 — End: 2021-01-10
  Administered 2021-01-10: 19:00:00 via INTRAVENOUS

## 2021-01-10 NOTE — Telephone Encounter (Signed)
I called to let Caleb Robinson know the recommendation and she said that when she told Caleb Robinson to go to the ED he said he was going to go to the radiation appt and see what they think. Caleb Robinson also said over all the pt is not doing well and she feels he is hospice appropriate   but the pt is not ready.

## 2021-01-10 NOTE — Telephone Encounter (Signed)
Rita from [facility] called to let us know that she went to visit [name] this morning and his BP is 85/45. Pt is also experiencing nausea, diarrhea and is dehydrated. Pt reports at radiation yesterday he was only 85lbs.    I told Velva Harman that I would pass this along to Dr Judeth Porch but that it would probably be a good idea for the pt to go be seen at the ED.

## 2021-01-10 NOTE — Progress Notes (Signed)
Arrived via wheelchair from radiation, 1L NS infused, pt tolerated well, discharged home via wheelchair

## 2021-01-11 ENCOUNTER — Ambulatory Visit: Primary: Family Medicine

## 2021-01-11 ENCOUNTER — Ambulatory Visit: Attending: Family | Primary: Family Medicine

## 2021-01-11 ENCOUNTER — Ambulatory Visit: Admit: 2021-01-11 | Discharge: 2021-01-11 | Payer: MEDICARE | Attending: Family | Primary: Family Medicine

## 2021-01-11 ENCOUNTER — Encounter

## 2021-01-11 ENCOUNTER — Inpatient Hospital Stay: Admit: 2021-01-11 | Payer: MEDICARE | Primary: Family Medicine

## 2021-01-11 ENCOUNTER — Ambulatory Visit: Admit: 2021-01-11 | Discharge: 2021-01-12 | Payer: MEDICARE | Primary: Family Medicine

## 2021-01-11 ENCOUNTER — Encounter: Payer: MEDICARE | Primary: Family Medicine

## 2021-01-11 DIAGNOSIS — Z008 Encounter for other general examination: Secondary | ICD-10-CM

## 2021-01-11 DIAGNOSIS — R197 Diarrhea, unspecified: Secondary | ICD-10-CM

## 2021-01-11 DIAGNOSIS — Z20822 Contact with and (suspected) exposure to covid-19: Secondary | ICD-10-CM

## 2021-01-11 LAB — C DIFFICILE TOXIN A & B BY EIA
C. DIFFICILE TOXINS A+B: NEGATIVE
C. difficile Toxins A+B: NEGATIVE

## 2021-01-11 LAB — SHIGA-LIKE TOXIN: E coli Shiga toxin: NEGATIVE

## 2021-01-11 MED ORDER — AZITHROMYCIN 500 MG TAB
500 mg | ORAL_TABLET | ORAL | 0 refills | Status: DC
Start: 2021-01-11 — End: 2021-02-06

## 2021-01-11 MED ORDER — IBUPROFEN 800 MG TAB
800 mg | ORAL_TABLET | ORAL | 0 refills | Status: DC
Start: 2021-01-11 — End: 2021-02-04

## 2021-01-11 NOTE — Progress Notes (Signed)
Nutrition F/U:  Assessment:  Pt seen earlier in the week regarding issues w/ feeding tube leaking - not leaking at bumper and area is slightly excoriated, tract widening; advised pt to use moisture barrier cream on skin and gauze between bumper and skin.  Pt had large apparatus at the end of the feeding tube used to lock and unlock - this is where the tube was leaking, removed and thrown away as this piece is not necessary to use for bolus feedings.  RD made referral to GI for MIC-KEY button placement as pt will be TF dependent for extended length of time, possibly indefinitely.  Pt c/o persistent diarrhea - C. Diff and shigella is negative, c/o sore throat/cough and congestion - decision to send him across the street for Covid test (addendum - pt did not go have this done).  Pt remains NPO, did not make it to his wound care appointment this week d/t N/V/D. Pt has completed 12 of 30 radiation fractions. No labs obtained today.  Current BW: 86.5#, down 1.5# over the past week.     Intervention:  1. NPO, water via syringe if able   2. Increase bolus feedings to 1.5 cartons QID to allow for 6 cartons/day, increase liquid pro-mod to QID.  Ensure/Boost between feedings as able.   3. Use Imodium - 2 doses after first episode, 1 dose after each following episode up to 8 doses a day.   4. Referral to GI for MIC-KEY button.  Desitin and gauze to protect skin under bumper.  Removed feeding connector, no further leaking noted.  5. Covid test ordered    Monitoring/Evaluation:  1. RD to follow up during next office visit - follow up wt status, tolerance/intake of TF, symptom management.    [place], [place], [place]

## 2021-01-11 NOTE — Progress Notes (Signed)
Outpatient Palliative Care at the  [place]: Office Visit Established Patient    Diagnosis: tongue cancer    Treatment Plan: s/p resection and reconstruction of mandible 09/27/20--> 12/30    Treatment Intent: curative    Medical Oncologist: Dr. Sonny Masters (no chemotherapy)    Radiation Oncologist: Dr. Nance Robinson    Navigator: Lynnell Chad, RD    Surgeon: Dr. Elana Alm [facility])      Chief Complaint:    Chief Complaint   Patient presents with   ??? Follow-up     History of Present Illness:  Caleb Robinson is a 70 y.o. male who presents today for evaluation regarding pain and symptom management and introduction to palliative medicine in the setting of tongue cancer for which pt has had resection, reconstruction of mandible, and is now to start XRT.  He came for XRT 12/18/20 but was able to tolerate due to mask.  He was prescribed 0.5mg  ativan by Dr. Nance Robinson and took 1 tablet prior to visit.  He was also prescribed liquid oxycodone, 10mg , by Dr. Kristopher Glee, but has not picked this up due to cost ($92.00).  He also takes ibuprofen 800mg  twice daily.  Is also out of this and is picking it up today.  He is quite anxious regarding XRT and the mask.  He also states he feels as if he is becoming more depressed the longer this goes on.  Does not feel as if he would harm himself or others, but is frustrated that he was actively employed, enjoyed talking with people, and being sociable and now his quality of life is 'in the toilet'.  His ex-wife is his caregiver.    Interval History:  Patient seen following XRT today.  He is in a wheelchair and accompanied by his ex-wife/caregiver.  Patient has not been feeling well this week.  He has had diarrhea, cough/congestion, sore throat.  Difficult to determine if sore throat is related to acute illness or radiation therapy.  He had stool tested several days ago that is negative for c diff.  He has been taking kaopectate. For his throat pain, he has been taking ibuprofen a couples times per day  and oxycodone, usually once once per day.  He denies nausea.  He has thick mucus, and not taking anything specifically for this.  He continues Zoloft 50mg  daily.  He has leg wound from surgery; he missed his appointment with wound care this past week due to not feeling well.       Review of Systems:  Review of Systems   Constitutional: Positive for malaise/fatigue and weight loss.   HENT: Positive for sore throat.         Mouth pain   Eyes: Negative.    Respiratory: Positive for cough.    Cardiovascular: Negative.    Gastrointestinal: Positive for diarrhea. Negative for nausea.   Genitourinary: Negative.    Musculoskeletal: Negative.    Skin: Negative.    Neurological: Negative.    Endo/Heme/Allergies: Negative.    Psychiatric/Behavioral: Positive for depression. Negative for suicidal ideas. The patient is nervous/anxious.          Allergies   Allergen Reactions   ??? Cat Dander Swelling     Past Medical History:   Diagnosis Date   ??? Chronic obstructive pulmonary disease ([place])     follows with Pulmonology.  daily and rescure inhaler. no home O2.   ??? Emphysema lung ([place])    ??? HTN (hypertension)      Past  Surgical History:   Procedure Laterality Date   ??? HX HERNIA REPAIR  05/2019     Family History   Problem Relation Age of Onset   ??? Dementia Mother      Social History     Socioeconomic History   ??? Marital status: SINGLE     Spouse name: Not on file   ??? Number of children: Not on file   ??? Years of education: Not on file   ??? Highest education level: Not on file   Occupational History   ??? Occupation: [employer]   Tobacco Use   ??? Smoking status: Current Every Day Smoker     Packs/day: 0.75     Years: 45.00     Pack years: 33.75   ??? Smokeless tobacco: Never Used   ??? Tobacco comment: stopped but restarted Dec 2019   Vaping Use   ??? Vaping Use: Never used   Substance and Sexual Activity   ??? Alcohol use: Yes     Alcohol/week: 1.0 standard drink     Types: 1 Cans of beer per week     Comment: occ   ??? Drug use: Never   ??? Sexual  activity: Not on file     Comment: widow   Other Topics Concern   ??? Not on file   Social History Narrative   ??? Not on file     Social Determinants of Health     Financial Resource Strain:    ??? Difficulty of Paying Living Expenses: Not on file   Food Insecurity:    ??? Worried About Running Out of Food in the Last Year: Not on file   ??? Ran Out of Food in the Last Year: Not on file   Transportation Needs:    ??? Lack of Transportation (Medical): Not on file   ??? Lack of Transportation (Non-Medical): Not on file   Physical Activity:    ??? Days of Exercise per Week: Not on file   ??? Minutes of Exercise per Session: Not on file   Stress:    ??? Feeling of Stress : Not on file   Social Connections:    ??? Frequency of Communication with Friends and Family: Not on file   ??? Frequency of Social Gatherings with Friends and Family: Not on file   ??? Attends Religious Services: Not on file   ??? Active Member of Clubs or Organizations: Not on file   ??? Attends Banker Meetings: Not on file   ??? Marital Status: Not on file   Intimate Partner Violence:    ??? Fear of Current or Ex-Partner: Not on file   ??? Emotionally Abused: Not on file   ??? Physically Abused: Not on file   ??? Sexually Abused: Not on file   Housing Stability:    ??? Unable to Pay for Housing in the Last Year: Not on file   ??? Number of Places Lived in the Last Year: Not on file   ??? Unstable Housing in the Last Year: Not on file     Current Outpatient Medications   Medication Sig Dispense Refill   ??? azithromycin (ZITHROMAX) 500 mg tab TAKE 1 TABLET BY MOUTH ONCE DAILY FOR 3 DAYS 3 Tablet 0   ??? ibuprofen (MOTRIN) 800 mg tablet Take 1 tablet by mouth twice daily as needed for pain 60 Tablet 0   ??? rosuvastatin (CRESTOR) 10 mg tablet Take 1 tablet by mouth nightly 90 Tablet 0   ???  oxyCODONE IR (ROXICODONE) 10 mg tab immediate release tablet Take 1 Tablet by mouth every four (4) hours as needed for Pain for up to 30 days. Max Daily Amount: 60 mg. 90 Tablet 0   ??? sertraline  (Zoloft) 20 mg/mL concentrated solution Take 2.5 mL by mouth daily. Indications: anxiousness associated with depression 60 mL 1   ??? LORazepam (ATIVAN) 0.5 mg tablet Take 1 Tablet by mouth daily. Max Daily Amount: 0.5 mg. 30 Tablet 0   ??? OTHER daily.     ??? Trelegy Ellipta 100-62.5-25 mcg inhaler Take 1 Puff by inhalation daily. 30 Each 3   ??? multivitamin (ONE A DAY) tablet Take 1 Tablet by mouth daily.     ??? melatonin 10 mg tab Take  by mouth. Take at night     ??? cholecalciferol (Vitamin D3) 25 mcg (1,000 unit) cap Take  by mouth daily.     ??? gabapentin (NEURONTIN) 100 mg capsule Take 2 Capsules by mouth three (3) times daily as needed for Pain (Restless leg). 360 Capsule 3   ??? albuterol (PROVENTIL HFA, VENTOLIN HFA, PROAIR HFA) 90 mcg/actuation inhaler Take 2 Puffs by inhalation every six (6) hours as needed for Wheezing. 1 Inhaler 6   ??? albuterol (PROVENTIL VENTOLIN) 2.5 mg /3 mL (0.083 %) nebu 3 mL by Nebulization route every six (6) hours as needed for Wheezing. One premix vial every 6hrs prn. 30 Nebule 5     Facility-Administered Medications Ordered in Other Visits   Medication Dose Route Frequency Provider Last Rate Last Admin   ??? saline peripheral flush soln 10 mL  10 mL InterCATHeter PRN Caleb Heman, Caleb Robinson   10 mL at 01/10/21 1531       OBJECTIVE:  Visit Vitals  BP (!) 98/45   Pulse 60   Resp 18   Wt 86 lb (39 kg)   SpO2 100%   BMI 14.76 kg/m??       Physical Exam:  Constitutional: Well developed, thin, frail, cachectic male in no acute distress.    HEENT: Normocephalic and atraumatic. Speech difficult to understand due to oral surgery.  Pupils are equal, round, and reactive to light. Extraocular muscles are intact.  Sclerae anicteric. Neck supple without JVD.   Lymph node   deferred   Skin Warm and dry.  No bruising and no rash noted.  No erythema.  No pallor. Wound to left leg, 2.5cm x 7cm.  Eschar with healing tissue underneath.  Dry dressing replaced.   Respiratory unlabored respiratory effort.    CVS  deferred   Abdomen Soft, nontender and nondistended   Neuro Grossly nonfocal with no obvious sensory or motor deficits.   MSK Normal range of motion in general.  No edema and no tenderness.   Psych Appropriate mood and affect.        Labs:  No results found for this or any previous visit (from the past 24 hour(s)).    Imaging:  No results found for this or any previous visit.    ASSESSMENT:    ICD-10-CM ICD-9-CM    1. Diarrhea, unspecified type  R19.7 787.91    2. Thick sputum  R09.3 786.4    3. Wound of left lower extremity, initial encounter  S81.802A 894.0    4. Fatigue, unspecified type  R53.83 780.79    5. Tongue cancer (HCC)  C02.9 141.9    6. Encounter for palliative care  Z51.5 V66.7      Problem List  Date Reviewed:  01/11/2021          Codes Class Noted    Tongue cancer (HCC) ICD-10-CM: C02.9  ICD-9-CM: 141.9  06/27/2020        History of tobacco abuse ICD-10-CM: Z87.891  ICD-9-CM: V15.82  09/28/2019    Overview Signed 06/27/2020  6:01 PM by Sofie Hartigan, Caleb Robinson     Last Assessment & Plan:   Formatting of this note might be different from the original.  After assessing the patient's willingness to quit, the patient was counseled for 4 minutes on tobacco cessation including the impact of smoking on health, the benefits of quitting, and various options available for quitting including medications, behavioral modifications, and support. The patient has not chosen a quit date.    He meets criteria for lung cancer screening. Will plan for shared decision making and ordering of LDCT for 09/2020 at an upcoming visit.             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  ECHO 06/08/2019:  Interpretation Summary     ?? The left ventricular systolic function is normal (55-65%).  ?? Grade I  (mild) left ventricular diastolic dysfunction present, consistent with impaired relaxation.  ?? The right ventricular systolic function is normal.  ?? 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 Mild-to-moderate mitral valve regurgitation. Consider TEE for further evaluation.  ?? Myxomatous tricuspid valve with mild regurgitation.  ?? Normal chamber sizes.  ?? There is moderate pulmonary hypertension.     Mild to moderate MR. Will need ongoing surveillance, no intervention at this time.             Centrilobular emphysema (HCC) ICD-10-CM: J43.2  ICD-9-CM: 492.8  06/09/2019    Overview Signed 06/27/2020  6:01 PM by Sofie Hartigan, Caleb Robinson     Last Assessment & Plan:   Formatting of this note might be different from the original.  Very severe obstruction in a current smoker. Smoking cessation strongly encouraged. Will discontinue his Dulera and start Trelegy. Continue PRN SABA. on return visit.             Unstable angina (HCC) ICD-10-CM: I20.0  ICD-9-CM: 411.1  06/08/2019    Overview Signed 06/27/2020  6:01 PM by Sofie Hartigan, Caleb Robinson     Formatting of this note might be different from the original.  Added automatically from request for surgery [phone]             Emphysema lung [facility]) ICD-10-CM: J43.9  ICD-9-CM: 492.8  Unknown                PLAN:  Lab studies and imaging studies were personally reviewed.    Pertinent old records were reviewed from [facility].     Case discussed with XRT, M.Duggan, RD.    1. Pain: Continue oxycodone 10mg  tablet q 4 hours as liquid is cost prohibitive.  Continue liquid ibuprofen.  He is currently only taking his gabapentin 100mg  at bedtime.    2. Anxiety: Continue Ativan 0.5mg  daily prior to XRT per Dr. .     3. Depression: Continue Zoloft 50mg  daily.    4. Left leg wound: wound care follow-up at [facility].     5. Diarrhea: c diff negative, add Imodium, up to 8 doses/day.    Due to multiple symptoms (diarrhea, cough, congestion, sore throat, fatigue), patient's  unvaccinated status, and no history of Covid-19, will have him tested.    Advanced Care Planning Discussed: None today.    Will follow up in: 1-2 weeks    I have reviewed the patient's controlled substance prescription history, as maintained in the [place] prescription monitoring program, so that the prescriptions(s) for a controlled substance can be given.  Last Date Reviewed: 01/11/21          [name] Crigler, NP  Outpatient Palliative Care at the  [facility]  [address] [place] [place] [address]  Office : [phone]  Fax : [phone]

## 2021-01-12 ENCOUNTER — Inpatient Hospital Stay: Admit: 2021-01-12 | Payer: MEDICARE | Primary: Family Medicine

## 2021-01-12 ENCOUNTER — Encounter

## 2021-01-12 LAB — COVID-19: SARS-CoV-2, NAA: NOT DETECTED

## 2021-01-12 LAB — NOVEL CORONAVIRUS (COVID-19): SARS-CoV-2, NAA: NOT DETECTED

## 2021-01-12 LAB — SARS-COV-2, NAA 2 DAY TAT

## 2021-01-15 ENCOUNTER — Inpatient Hospital Stay: Admit: 2021-01-15 | Payer: MEDICARE | Primary: Family Medicine

## 2021-01-15 MED ORDER — MIRTAZAPINE 15 MG TAB
15 mg | ORAL_TABLET | ORAL | 0 refills | Status: DC
Start: 2021-01-15 — End: 2021-02-06

## 2021-01-15 MED FILL — SERTRALINE HCL 20MG/ML CONC: 20 mg/mL | 30 days supply | Qty: 60 | Fill #1 | Status: AC

## 2021-01-16 ENCOUNTER — Inpatient Hospital Stay: Admit: 2021-01-16 | Payer: MEDICARE | Primary: Family Medicine

## 2021-01-16 NOTE — Progress Notes (Signed)
Patient: Caleb Robinson MRN: [id number]  SSN: [id number]    Date of Birth: 1951-02-02  Age: 70 y.o.  Sex: male      DIAGNOSIS:  pT3N3b floor of mouth SCCa    TREATMENT SITE:  H&N    DOSE and FRACTIONATION:  15 of 30 fractions; 3000 cGy of 6000 cGy    INTERVAL HISTORY:  Caleb Robinson is a 70 y.o. male being treated for H&N cancer post-op.    Week 1: no complaints   Week 2: noticing more bone being exposed; no pain  Week 3: continues to have diarrhea C. Diff was negative; notes pain    OBJECTIVE:  NAD; gr 2 mucositis  Visit Vitals  BP (!) 106/43   Pulse 68   Temp 97 ??F (36.1 ??C)   Wt 38.1 kg (84 lb)   SpO2 92%   BMI 14.42 kg/m??       Lab Results   Component Value Date/Time    Sodium 142 12/12/2020 02:03 PM    Potassium 5.4 (H) 12/12/2020 02:03 PM    Chloride 110 (H) 12/12/2020 02:03 PM    CO2 23 12/12/2020 02:03 PM    Anion gap 9 12/12/2020 02:03 PM    Glucose 77 12/12/2020 02:03 PM    BUN 41 (H) 12/12/2020 02:03 PM    Creatinine 0.90 12/12/2020 02:03 PM    GFR est AA >60 12/12/2020 02:03 PM    GFR est non-AA >60 12/12/2020 02:03 PM    Calcium 9.6 12/12/2020 02:03 PM    Magnesium 2.8 (H) 12/12/2020 02:03 PM    Albumin 3.6 12/12/2020 02:03 PM    Protein, total 6.8 12/12/2020 02:03 PM    Globulin 3.2 12/12/2020 02:03 PM    A-G Ratio 1.1 (L) 12/12/2020 02:03 PM    ALT (SGPT) 82 (H) 12/12/2020 02:03 PM     Lab Results   Component Value Date/Time    WBC 5.7 12/12/2020 02:03 PM    HGB 12.5 (L) 12/12/2020 02:03 PM    HCT 41.4 12/12/2020 02:03 PM    PLATELET 153 12/12/2020 02:03 PM       ASSESSMENT and PLAN:  Caleb Robinson is tolerating radiation as anticipated for the current dose and fraction.  We will continue on as planned with another treatment visit anticipated next week. IVF prescribed. GI referral re: diarrhea.      Osa Craver, MD   January 16, 2021

## 2021-01-17 ENCOUNTER — Inpatient Hospital Stay: Admit: 2021-01-17 | Payer: MEDICARE | Primary: Family Medicine

## 2021-01-17 ENCOUNTER — Inpatient Hospital Stay: Admit: 2021-01-17 | Payer: MEDICARE | Attending: Critical Care Medicine | Primary: Family Medicine

## 2021-01-17 DIAGNOSIS — E860 Dehydration: Secondary | ICD-10-CM

## 2021-01-17 DIAGNOSIS — R911 Solitary pulmonary nodule: Secondary | ICD-10-CM

## 2021-01-17 MED ORDER — SODIUM CHLORIDE 0.9 % IV
Freq: Once | INTRAVENOUS | Status: AC
Start: 2021-01-17 — End: 2021-01-17
  Administered 2021-01-17: 19:00:00 via INTRAVENOUS

## 2021-01-17 NOTE — Progress Notes (Signed)
The pt is supposed to have appt but I do not see one- set this up

## 2021-01-17 NOTE — Progress Notes (Signed)
Arrived to the Infusion Center.  Assessment completed. 1 liter of NS completed. Patient tolerated without problems.   Any issues or concerns during appointment: None  Patient instructed to call provider with temperature of 100.4 or greater or nausea/vomiting/ diarrhea or pain not controlled by medications  Instructed to call provider with any side effects or other concerns  Patient has no future appointment  Discharged via W/C with family

## 2021-01-17 NOTE — Progress Notes (Signed)
Pt has an up coming appointment with Mendel Ryder 02/14/2021. Lillie Fragmin, [place]

## 2021-01-17 NOTE — Progress Notes (Signed)
Arrived to infusion center . Plan of care reviewed for IVF today

## 2021-01-18 ENCOUNTER — Ambulatory Visit: Primary: Family Medicine

## 2021-01-18 ENCOUNTER — Encounter: Attending: Family | Primary: Family Medicine

## 2021-01-18 ENCOUNTER — Ambulatory Visit: Admit: 2021-01-18 | Discharge: 2021-01-18 | Payer: MEDICARE | Primary: Family Medicine

## 2021-01-18 ENCOUNTER — Inpatient Hospital Stay: Admit: 2021-01-18 | Payer: MEDICARE | Primary: Family Medicine

## 2021-01-18 DIAGNOSIS — Z008 Encounter for other general examination: Secondary | ICD-10-CM

## 2021-01-18 NOTE — Progress Notes (Signed)
Nutrition F/U:  Assessment:  Pt seen during after radiation, has completed 17 of 30 radiation fractions, has been seen by GI and MIC-KEY button ordered, diarrhea slightly improved w/ Imodium - 2nd referral placed to GI for persistent diarrhea.  Pt's roommate reports that he is doing 3-4 tube feedings per day, has increased his liquid pro-mod to be given with each feeding, not consistently doing 1 1/2 cartons at each feeding so intake is likely inadequate at this time given increased nutrition needs w/ radiation and continued post op healing.  Pt denies pain - continues to take Oxycodone (10 mg) twice a day, continues to take Ativan prior to radiation daily, using Imodium for diarrhea, Remeron (15 mg) q HS added by PCP earlier this week.  Pt's covid test was negative.  No labs obtained.  Current BW: 85.8#, down ~ 1# over the past week.     Intervention:  1. NPO, liquids via syringe po if tolerated   2. Continue w/ current TF regimen - goal 4 cartons/day, liquid pro-mod w/ each feeding, extra feeding or Ensure/Boost as able.   3. Imodium for diarrhea - up to 8 tabs per day.  New GI referral for diarrhea made by Rad Onc  4. MIC-KEY button ordered by GI  - will be placed in office     Monitoring/Evaluation:  1. RD to follow up during next office visit - follow up wt status, tolerance/intake of TF, symptom management.    Remigio Eisenmenger RD, CSO, LD  [phone]

## 2021-01-19 ENCOUNTER — Inpatient Hospital Stay: Admit: 2021-01-19 | Payer: MEDICARE | Primary: Family Medicine

## 2021-01-19 NOTE — Telephone Encounter (Signed)
SW spoke with pts ex wife regarding transportation.  Pt's ex wife has been transporting him to appointments.    SW will submit referral to the [employer] so that pt can utilize mileage reimbursement.    Sw will price the Roundtrip costs and look into pt's insurance to see if he has any transportation benefits.  Sw will follow up with pt's ex wife.

## 2021-01-22 ENCOUNTER — Inpatient Hospital Stay: Admit: 2021-01-22 | Payer: MEDICARE | Primary: Family Medicine

## 2021-01-23 ENCOUNTER — Inpatient Hospital Stay: Admit: 2021-01-23 | Payer: MEDICARE | Primary: Family Medicine

## 2021-01-23 DIAGNOSIS — C029 Malignant neoplasm of tongue, unspecified: Secondary | ICD-10-CM

## 2021-01-23 MED ORDER — DIPHENOXYLATE-ATROPINE 2.5 MG-0.025 MG TAB
ORAL_TABLET | Freq: Four times a day (QID) | ORAL | 5 refills | Status: AC | PRN
Start: 2021-01-23 — End: ?
  Filled 2021-01-23: qty 60, 15d supply, fill #0

## 2021-01-23 NOTE — Telephone Encounter (Signed)
Called [facility] talked to [facility], explained that we are currently seeing pt for Rad Tx. To his tongue area.  Explained that pt has uncontrolled diarrhea and it is not due to the rad tx we are giving.  Asked them to see pt for that problem. Per Aundra Millet she didn't have anything soon to see pt said she would send to nurse to review and they will reach out to pt.

## 2021-01-23 NOTE — Progress Notes (Signed)
Patient: Caleb Robinson MRN: [id number]  SSN: [id number]    Date of Birth: Dec 14, 1950  Age: 70 y.o.  Sex: male      DIAGNOSIS:  pT3N3b floor of mouth SCCa    TREATMENT SITE:  H&N    DOSE and FRACTIONATION:  20 of 30 fractions; 4000 cGy of 6000 cGy    INTERVAL HISTORY:  Caleb Robinson is a 70 y.o. male being treated for H&N cancer post-op.    Week 1: no complaints   Week 2: noticing more bone being exposed; no pain  Week 3: continues to have diarrhea C. Diff was negative; notes pain  Week 4: pain is controlled; diarrhea not controlled on imodium - GI referral pending    OBJECTIVE:  NAD; gr 2 mucositis  Visit Vitals  BP (!) 81/42 (BP Patient Position: Sitting)   Pulse 69   Temp 97 ??F (36.1 ??C)   Wt 38.5 kg (84 lb 12.8 oz)   SpO2 94%   BMI 14.56 kg/m??       Lab Results   Component Value Date/Time    Sodium 142 12/12/2020 02:03 PM    Potassium 5.4 (H) 12/12/2020 02:03 PM    Chloride 110 (H) 12/12/2020 02:03 PM    CO2 23 12/12/2020 02:03 PM    Anion gap 9 12/12/2020 02:03 PM    Glucose 77 12/12/2020 02:03 PM    BUN 41 (H) 12/12/2020 02:03 PM    Creatinine 0.90 12/12/2020 02:03 PM    GFR est AA >60 12/12/2020 02:03 PM    GFR est non-AA >60 12/12/2020 02:03 PM    Calcium 9.6 12/12/2020 02:03 PM    Magnesium 2.8 (H) 12/12/2020 02:03 PM    Albumin 3.6 12/12/2020 02:03 PM    Protein, total 6.8 12/12/2020 02:03 PM    Globulin 3.2 12/12/2020 02:03 PM    A-G Ratio 1.1 (L) 12/12/2020 02:03 PM    ALT (SGPT) 82 (H) 12/12/2020 02:03 PM     Lab Results   Component Value Date/Time    WBC 5.7 12/12/2020 02:03 PM    HGB 12.5 (L) 12/12/2020 02:03 PM    HCT 41.4 12/12/2020 02:03 PM    PLATELET 153 12/12/2020 02:03 PM       ASSESSMENT and PLAN:  Caleb Robinson is tolerating radiation as anticipated for the current dose and fraction.  We will continue on as planned with another treatment visit anticipated next week. IVF prescribed. GI referral re: diarrhea. Lomotil prescribed      Osa Craver, MD   January 23, 2021

## 2021-01-24 ENCOUNTER — Ambulatory Visit: Attending: Adult Health | Primary: Family Medicine

## 2021-01-24 ENCOUNTER — Ambulatory Visit: Primary: Family Medicine

## 2021-01-24 ENCOUNTER — Ambulatory Visit: Admit: 2021-01-24 | Discharge: 2021-01-24 | Payer: MEDICARE | Attending: Adult Health | Primary: Family Medicine

## 2021-01-24 ENCOUNTER — Ambulatory Visit: Admit: 2021-01-24 | Discharge: 2021-01-24 | Payer: MEDICARE | Primary: Family Medicine

## 2021-01-24 ENCOUNTER — Inpatient Hospital Stay: Admit: 2021-01-24 | Payer: MEDICARE | Primary: Family Medicine

## 2021-01-24 DIAGNOSIS — Z008 Encounter for other general examination: Secondary | ICD-10-CM

## 2021-01-24 DIAGNOSIS — K117 Disturbances of salivary secretion: Secondary | ICD-10-CM

## 2021-01-24 DIAGNOSIS — E860 Dehydration: Secondary | ICD-10-CM

## 2021-01-24 MED ORDER — OXYCODONE 10 MG TAB
10 mg | ORAL_TABLET | ORAL | 0 refills | Status: DC | PRN
Start: 2021-01-24 — End: 2021-02-06
  Filled 2021-01-24: qty 90, 15d supply, fill #0

## 2021-01-24 MED ORDER — ARTIFICIAL SALIVA (YERBAS-LYT) SPRAY
1 refills | Status: AC | PRN
Start: 2021-01-24 — End: ?

## 2021-01-24 MED ORDER — SODIUM CHLORIDE 0.9% BOLUS IV
0.9 % | Freq: Once | INTRAVENOUS | Status: AC
Start: 2021-01-24 — End: 2021-01-24
  Administered 2021-01-24: 20:00:00 via INTRAVENOUS

## 2021-01-24 NOTE — Progress Notes (Signed)
Problem: Knowledge Deficit  Goal: *Participate in the learning process  Outcome: Progressing Towards Goal  Goal: *Verbalize description of procedure  Outcome: Progressing Towards Goal  Goal: *Verbalizes understanding and describes medication purposes and frequencies  Outcome: Progressing Towards Goal  Goal: *Knowledge of discharge instructions  Outcome: Progressing Towards Goal  Goal: Interventions  Outcome: Progressing Towards Goal     Problem: Patient Education: Go to Patient Education Activity  Goal: Patient/Family Education  Outcome: Progressing Towards Goal

## 2021-01-24 NOTE — Progress Notes (Signed)
Outpatient Palliative Care at the  [facility]: Office Visit Established Patient    Diagnosis: tongue cancer    Treatment Plan: s/p resection and reconstruction of mandible 09/27/20--> 21/30    Treatment Intent: curative    Medical Oncologist: Dr. Henrene Hawking (no chemotherapy)    Radiation Oncologist: Dr. Sudie Bailey    Navigator: Ottis Stain, RD    Surgeon: Dr. Morrie Sheldon [facility])      Chief Complaint:    Chief Complaint   Patient presents with   ??? Follow-up   ??? Sore Throat     History of Present Illness:  Caleb Robinson is a 70 y.o. male who presents today for evaluation regarding pain and symptom management and introduction to palliative medicine in the setting of tongue cancer for which pt has had resection, reconstruction of mandible, and is now to start XRT.  He came for XRT 12/18/20 but was able to tolerate due to mask.  He was prescribed 0.5mg  ativan by Dr. Sudie Bailey and took 1 tablet prior to visit.  He was also prescribed liquid oxycodone, 10mg , by Dr. , but has not picked this up due to cost ($92.00).  He also takes ibuprofen 800mg  twice daily.  Is also out of this and is picking it up today.  He is quite anxious regarding XRT and the mask.  He also states he feels as if he is becoming more depressed the longer this goes on.  Does not feel as if he would harm himself or others, but is frustrated that he was actively employed, enjoyed talking with people, and being sociable and now his quality of life is 'in the toilet'.  His ex-wife is his caregiver.    Interval History:  Patient seen following XRT today.  He is in a wheelchair and accompanied by his ex-wife/caregiver.  Patient states he is feeling 'so-so'.  He has had diarrhea, and was prescribed lomotil which he started yesterday.  He was found to be cdiff negative.  He did take lomotil yesterday but hasn't taken since despite saying that he had diarrhea this morning.  For his throat pain, he has been taking ibuprofen a couples times per day and oxycodone,  usually once once per day but yesterday took 4x day.  He denies nausea.  He has thick mucus, and not taking anything specifically for this.  States he really can't rinse/spit due to tongue/facial reconstruction.   He continues Zoloft 50mg  daily.  He has leg wound from surgery.  He is receiving IVF intermittently and RadOnc would like pt to have IVF 2-3x week for the next few weeks.    Review of Systems:  Review of Systems   Constitutional: Positive for malaise/fatigue.   HENT: Positive for sore throat.         Mouth pain  Dry mouth   Eyes: Negative.    Respiratory: Positive for cough.    Cardiovascular: Negative.    Gastrointestinal: Positive for diarrhea. Negative for nausea.   Genitourinary: Negative.    Musculoskeletal: Negative.    Skin: Negative.    Neurological: Negative.    Endo/Heme/Allergies: Negative.    Psychiatric/Behavioral: Positive for depression. Negative for suicidal ideas. The patient is nervous/anxious.          Allergies   Allergen Reactions   ??? Cat Dander Swelling     Past Medical History:   Diagnosis Date   ??? Chronic obstructive pulmonary disease (HCC)     follows with Pulmonology.  daily and rescure inhaler. no home  O2.   ??? Emphysema lung ([place])    ??? HTN (hypertension)    ??? Tongue cancer [facility])      Past Surgical History:   Procedure Laterality Date   ??? HX HERNIA REPAIR  05/2019     Family History   Problem Relation Age of Onset   ??? Dementia Mother      Social History     Socioeconomic History   ??? Marital status: SINGLE     Spouse name: Not on file   ??? Number of children: Not on file   ??? Years of education: Not on file   ??? Highest education level: Not on file   Occupational History   ??? Occupation: [employer]   Tobacco Use   ??? Smoking status: Current Every Day Smoker     Packs/day: 0.75     Years: 45.00     Pack years: 33.75   ??? Smokeless tobacco: Never Used   ??? Tobacco comment: stopped but restarted Dec 2019   Vaping Use   ??? Vaping Use: Never used   Substance and Sexual Activity   ??? Alcohol use:  Yes     Alcohol/week: 1.0 standard drink     Types: 1 Cans of beer per week     Comment: occ   ??? Drug use: Never   ??? Sexual activity: Not on file     Comment: widow   Other Topics Concern   ??? Not on file   Social History Narrative   ??? Not on file     Social Determinants of Health     Financial Resource Strain:    ??? Difficulty of Paying Living Expenses: Not on file   Food Insecurity:    ??? Worried About Running Out of Food in the Last Year: Not on file   ??? Ran Out of Food in the Last Year: Not on file   Transportation Needs:    ??? Lack of Transportation (Medical): Not on file   ??? Lack of Transportation (Non-Medical): Not on file   Physical Activity:    ??? Days of Exercise per Week: Not on file   ??? Minutes of Exercise per Session: Not on file   Stress:    ??? Feeling of Stress : Not on file   Social Connections:    ??? Frequency of Communication with Friends and Family: Not on file   ??? Frequency of Social Gatherings with Friends and Family: Not on file   ??? Attends Religious Services: Not on file   ??? Active Member of Clubs or Organizations: Not on file   ??? Attends Archivist Meetings: Not on file   ??? Marital Status: Not on file   Intimate Partner Violence:    ??? Fear of Current or Ex-Partner: Not on file   ??? Emotionally Abused: Not on file   ??? Physically Abused: Not on file   ??? Sexually Abused: Not on file   Housing Stability:    ??? Unable to Pay for Housing in the Last Year: Not on file   ??? Number of Places Lived in the Last Year: Not on file   ??? Unstable Housing in the Last Year: Not on file     Current Outpatient Medications   Medication Sig Dispense Refill   ??? oxyCODONE IR (ROXICODONE) 10 mg tab immediate release tablet Take 1 Tablet by mouth every four (4) hours as needed for Pain for up to 30 days. Max Daily Amount: 60 mg. 90  Tablet 0   ??? artificial saliva (MOUTH KOTE) spra Take 1 Spray by mouth as needed (dry mouth). 59 mL 1   ??? diphenoxylate-atropine (LomotiL) 2.5-0.025 mg per tablet Take 1 Tablet by mouth  four (4) times daily as needed for Diarrhea. Max Daily Amount: 4 Tablets. 60 Tablet 5   ??? mirtazapine (REMERON) 15 mg tablet Take 1 tablet by mouth nightly 90 Tablet 0   ??? azithromycin (ZITHROMAX) 500 mg tab TAKE 1 TABLET BY MOUTH ONCE DAILY FOR 3 DAYS 3 Tablet 0   ??? ibuprofen (MOTRIN) 800 mg tablet Take 1 tablet by mouth twice daily as needed for pain 60 Tablet 0   ??? rosuvastatin (CRESTOR) 10 mg tablet Take 1 tablet by mouth nightly 90 Tablet 0   ??? sertraline (Zoloft) 20 mg/mL concentrated solution Take 2.5 mL by mouth daily. Indications: anxiousness associated with depression 60 mL 1   ??? LORazepam (ATIVAN) 0.5 mg tablet Take 1 Tablet by mouth daily. Max Daily Amount: 0.5 mg. 30 Tablet 0   ??? OTHER daily.     ??? Trelegy Ellipta 100-62.5-25 mcg inhaler Take 1 Puff by inhalation daily. 30 Each 3   ??? multivitamin (ONE A DAY) tablet Take 1 Tablet by mouth daily.     ??? melatonin 10 mg tab Take  by mouth. Take at night     ??? cholecalciferol (Vitamin D3) 25 mcg (1,000 unit) cap Take  by mouth daily.     ??? gabapentin (NEURONTIN) 100 mg capsule Take 2 Capsules by mouth three (3) times daily as needed for Pain (Restless leg). 360 Capsule 3   ??? albuterol (PROVENTIL HFA, VENTOLIN HFA, PROAIR HFA) 90 mcg/actuation inhaler Take 2 Puffs by inhalation every six (6) hours as needed for Wheezing. 1 Inhaler 6   ??? albuterol (PROVENTIL VENTOLIN) 2.5 mg /3 mL (0.083 %) nebu 3 mL by Nebulization route every six (6) hours as needed for Wheezing. One premix vial every 6hrs prn. 30 Nebule 5       OBJECTIVE:  Visit Vitals  BP 121/70 (BP 1 Location: Right upper arm, BP Patient Position: Sitting, BP Cuff Size: Adult)   Pulse 75   Temp (!) 95.6 ??F (35.3 ??C) (Axillary)   SpO2 95%       Physical Exam:  Constitutional: Well developed, thin, frail, cachectic male in no acute distress. Ex-wife present.   HEENT: Normocephalic and atraumatic. Speech difficult to understand due to oral surgery.  Pupils are equal, round, and reactive to light. Extraocular  muscles are intact.  Sclerae anicteric. Neck supple without JVD. Oropharynx erythematous.    Lymph node   deferred   Skin Warm and dry.  No bruising and no rash noted.  No erythema.  No pallor. Wound to left leg not visualized.   Respiratory unlabored respiratory effort.    CVS deferred   Abdomen Soft, nontender and nondistended   Neuro Grossly nonfocal with no obvious sensory or motor deficits.   MSK Normal range of motion in general.  No edema and no tenderness.   Psych Appropriate mood and affect.        Labs:  No results found for this or any previous visit (from the past 24 hour(s)).    Imaging:  No results found for this or any previous visit.    ASSESSMENT:    ICD-10-CM ICD-9-CM    1. Xerostomia  K11.7 527.7 artificial saliva (MOUTH KOTE) spra   2. Cancer related pain  G89.3 338.3 oxyCODONE IR (ROXICODONE) 10 mg  tab immediate release tablet   3. Fatigue, unspecified type  R53.83 780.79    4. Tongue cancer (HCC)  C02.9 141.9    5. Diarrhea, unspecified type  R19.7 787.91    6. Encounter for palliative care  Z51.5 V66.7      Problem List  Date Reviewed: 2021-02-14          Codes Class Noted    Dehydration ICD-10-CM: E86.0  ICD-9-CM: 276.51  01/17/2021        Tongue cancer ([place]) ICD-10-CM: C02.9  ICD-9-CM: 141.9  06/27/2020        History of tobacco abuse ICD-10-CM: Z87.891  ICD-9-CM: V15.82  09/28/2019    Overview Signed 06/27/2020  6:01 PM by Rayann Heman, MD     Last Assessment & Plan:   Formatting of this note might be different from the original.  After assessing the patient's willingness to quit, the patient was counseled for 4 minutes on tobacco cessation including the impact of smoking on health, the benefits of quitting, and various options available for quitting including medications, behavioral modifications, and support. The patient has not chosen a quit date.    He meets criteria for lung cancer screening. Will plan for shared decision making and ordering of LDCT for 09/2020 at an upcoming visit.              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  ECHO 06/08/2019:  Interpretation Summary     ?? The left ventricular systolic function is normal (55-65%).  ?? Grade I (mild) left ventricular diastolic dysfunction present, consistent with impaired relaxation.  ?? The right ventricular systolic function is normal.  ?? 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 Mild-to-moderate mitral valve regurgitation. Consider TEE for further evaluation.  ?? Myxomatous tricuspid valve with mild regurgitation.  ?? Normal chamber sizes.  ?? There is moderate pulmonary hypertension.     Mild to moderate MR. Will need ongoing surveillance, no intervention at this time.             Centrilobular emphysema ([place]) ICD-10-CM: J43.2  ICD-9-CM: 492.8  06/09/2019    Overview Signed 06/27/2020  6:01 PM by Rayann Heman, MD     Last Assessment & Plan:   Formatting of this note might be different from the original.  Very severe obstruction in a current smoker. Smoking cessation strongly encouraged. Will discontinue his Dulera and start Trelegy. Continue PRN SABA. 77mwt on return visit.             Unstable angina (HCC) ICD-10-CM: I20.0  ICD-9-CM: 411.1  06/08/2019    Overview Signed 06/27/2020  6:01 PM by Rayann Heman, MD     Formatting of this note might be different from the original.  Added automatically from request for surgery [phone]             Emphysema lung [facility]) ICD-10-CM: J43.9  ICD-9-CM: 492.8  Unknown                PLAN:  Lab studies and imaging studies were personally reviewed.    Pertinent old records were reviewed from [facility].     Case discussed with XRT, M.Duggan, RD.  1. Pain: Continue oxycodone 10mg  tablet q 4 hours; discussed increasing this but pt does not feel this  is necessary.    2. Anxiety: Continue Ativan 0.5mg  daily prior to XRT per Dr. .     3. Depression: Continue Zoloft 50mg  daily.    4. Left leg wound: [facility].     5. Diarrhea: c diff negative; encouraged pt to utilize lomotil as prescribed and that he may alternate this with imodium.  Will see GI 01/25/21.    6. Dry Mouth: mouthkote spray prn    Advanced Care Planning Discussed: None today.    Will follow up in: 1 week    I have reviewed the patient's controlled substance prescription history, as maintained in the [facility] prescription monitoring program, so that the prescriptions(s) for a controlled substance can be given.  Last Date Reviewed: 01/24/21          [place], NP  Outpatient Palliative Care at the  [facility]  [address]  [place] [address]  Office : [phone]  Fax : [phone]

## 2021-01-24 NOTE — Progress Notes (Signed)
Ar[facility] to the [facility].  1 L IVF infusion completed. Patient tolerated well.   Any issues or concerns during appointment: none.  Discharged in wheelchair with spouse.

## 2021-01-24 NOTE — Progress Notes (Signed)
Nutrition F/U:  Assessment:  Pt seen after radiation, has completed 21 of 30 radiation fractions, has received IVF once a week - infusion having a difficult time sticking patient, and veins are infiltrating w/ infusion.  Pt has been seen by GI for ordering his MIC-KEY button, has appointment (2/17) w/ GI for work up of persistent diarrhea.  Pt reports increased oral pain - taking 4 oxycodone IR (10 mg) tabs daily now, taking Imodium intermittently - not optimizing full dose (8 tabs per day), Lomotil prescribed today and pt instructed to alternate between the two.  Pt reports consuming 2 cartons of Nutren 1.5 TID, liquid promod dose w/ each feeding, working w/ home health SLP - can take water via syringe and doing this throughout the day, has tried applesauce w/ success, pudding is too thick, pt wanting to try a milkshake via small spoon next.  Current BW: 84#, down ~1# over the past week.      Intervention:  1. Continue w/ liquids via syringe, has home health SLP for 2 more weeks - then will need referral to OP SLP @ [facility].   2. Continue w/ current TF regimen - Nutren 1.5, 2 cartons TID.  Push intake of fluids via PEG.   3. No further IVF given vein issues - discussed w/ Dr. Nance Pew  4. Continue w/ Oxycodone IR (10 mg) q 4 hrs prn.  Continue w/ Imodium - up to 8 tabs/day, add Lomotil - alternate between the 2 meds   5. GI consult for (2/17) for diarrhea     Monitoring/Evaluation:  1. RD to follow up during next office visit - follow up wt status, tolerance/intake of TF, symptom management.    [place], [place], [place]

## 2021-01-25 ENCOUNTER — Encounter

## 2021-01-25 ENCOUNTER — Encounter: Attending: Family | Primary: Family Medicine

## 2021-01-25 ENCOUNTER — Inpatient Hospital Stay: Admit: 2021-01-25 | Payer: MEDICARE | Primary: Family Medicine

## 2021-01-25 ENCOUNTER — Encounter: Primary: Family Medicine

## 2021-01-25 DIAGNOSIS — K9423 Gastrostomy malfunction: Secondary | ICD-10-CM

## 2021-01-25 MED ORDER — DIATRIZOATE MEGLUMINE & SODIUM 66 %-10 % ORAL SOLN
66-10 % | Freq: Once | ORAL | Status: AC
Start: 2021-01-25 — End: 2021-01-25
  Administered 2021-01-25: 23:00:00 via ORAL

## 2021-01-25 MED FILL — DIPHENOXYLATE-ATROPI 2.5-0.025 TABS: 2.5-0.025 mg | 15 days supply | Qty: 60 | Fill #1 | Status: AC

## 2021-01-25 NOTE — Progress Notes (Signed)
Gave results to on call GI doctor -- Dr Mare Ferrari

## 2021-01-25 NOTE — Care Coordination-Inpatient (Signed)
Received call from [facility] in the pharmacy.  Pt is there stating he lost his lomotil that was filled 2 days ago.  Verbal order from Dr. Sudie Bailey that it is ok to refill early.    Iva Boop, RN

## 2021-01-26 ENCOUNTER — Encounter

## 2021-01-26 ENCOUNTER — Encounter: Payer: MEDICARE | Primary: Family Medicine

## 2021-01-29 ENCOUNTER — Inpatient Hospital Stay: Admit: 2021-01-29 | Payer: MEDICARE | Primary: Family Medicine

## 2021-01-30 ENCOUNTER — Encounter

## 2021-01-30 ENCOUNTER — Inpatient Hospital Stay: Admit: 2021-01-30 | Payer: MEDICARE | Primary: Family Medicine

## 2021-01-30 MED ORDER — LORAZEPAM 0.5 MG TAB
0.5 mg | ORAL_TABLET | Freq: Every day | ORAL | 0 refills | Status: AC
Start: 2021-01-30 — End: ?
  Filled 2021-01-30: qty 30, 30d supply, fill #0

## 2021-01-30 NOTE — Progress Notes (Signed)
Patient: Caleb Robinson MRN: [id number]  SSN: [id number]    Date of Birth: 12-23-1950  Age: 70 y.o.  Sex: male      DIAGNOSIS:  pT3N3b floor of mouth SCCa    TREATMENT SITE:  H&N    DOSE and FRACTIONATION:  24 of 30 fractions; 4800 cGy of 6000 cGy    INTERVAL HISTORY:  Caleb Robinson is a 70 y.o. male being treated for H&N cancer post-op.    Week 1: no complaints   Week 2: noticing more bone being exposed; no pain  Week 3: continues to have diarrhea C. Diff was negative; notes pain  Week 4: pain is controlled; diarrhea not controlled on imodium - GI referral pending  Week 5: pain controlled; diarrhea improved; gaining weight    OBJECTIVE:  NAD; gr 2 mucositis  Visit Vitals  BP (!) 100/40   Temp (!) 96.6 ??F (35.9 ??C)   Wt 40.6 kg (89 lb 6.4 oz)   SpO2 96%   BMI 15.35 kg/m??       Lab Results   Component Value Date/Time    Sodium 142 12/12/2020 02:03 PM    Potassium 5.4 (H) 12/12/2020 02:03 PM    Chloride 110 (H) 12/12/2020 02:03 PM    CO2 23 12/12/2020 02:03 PM    Anion gap 9 12/12/2020 02:03 PM    Glucose 77 12/12/2020 02:03 PM    BUN 41 (H) 12/12/2020 02:03 PM    Creatinine 0.90 12/12/2020 02:03 PM    GFR est AA >60 12/12/2020 02:03 PM    GFR est non-AA >60 12/12/2020 02:03 PM    Calcium 9.6 12/12/2020 02:03 PM    Magnesium 2.8 (H) 12/12/2020 02:03 PM    Albumin 3.6 12/12/2020 02:03 PM    Protein, total 6.8 12/12/2020 02:03 PM    Globulin 3.2 12/12/2020 02:03 PM    A-G Ratio 1.1 (L) 12/12/2020 02:03 PM    ALT (SGPT) 82 (H) 12/12/2020 02:03 PM     Lab Results   Component Value Date/Time    WBC 5.7 12/12/2020 02:03 PM    HGB 12.5 (L) 12/12/2020 02:03 PM    HCT 41.4 12/12/2020 02:03 PM    PLATELET 153 12/12/2020 02:03 PM       ASSESSMENT and PLAN:  Caleb Robinson is tolerating radiation as anticipated for the current dose and fraction.  We will continue on as planned with another treatment visit anticipated next week.    Osa Craver, MD   January 30, 2021

## 2021-01-31 ENCOUNTER — Encounter: Payer: MEDICARE | Attending: Adult Health | Primary: Family Medicine

## 2021-01-31 ENCOUNTER — Encounter: Payer: MEDICARE | Primary: Family Medicine

## 2021-02-01 ENCOUNTER — Inpatient Hospital Stay: Admit: 2021-02-01 | Payer: MEDICARE | Primary: Family Medicine

## 2021-02-02 ENCOUNTER — Ambulatory Visit: Attending: Adult Health | Primary: Family Medicine

## 2021-02-02 ENCOUNTER — Inpatient Hospital Stay: Admit: 2021-02-02 | Payer: MEDICARE | Primary: Family Medicine

## 2021-02-02 ENCOUNTER — Ambulatory Visit: Admit: 2021-02-02 | Discharge: 2021-02-02 | Payer: MEDICARE | Attending: Adult Health | Primary: Family Medicine

## 2021-02-02 DIAGNOSIS — S81802A Unspecified open wound, left lower leg, initial encounter: Secondary | ICD-10-CM

## 2021-02-02 NOTE — Progress Notes (Signed)
Outpatient Palliative Care at the  [facility]: Office Visit Established Patient    Diagnosis: tongue cancer    Treatment Plan: s/p resection and reconstruction of mandible 09/27/20--> 26/30    Treatment Intent: curative    Medical Oncologist: Dr. Henrene Hawking (no chemotherapy)    Radiation Oncologist: Dr. Sudie Bailey    Navigator: Ottis Stain, RD    Surgeon: Dr. Morrie Sheldon [facility])      Chief Complaint:    Chief Complaint   Patient presents with   ??? Follow-up     History of Present Illness:  [name] is a 70 y.o. male who presents today for evaluation regarding pain and symptom management and introduction to palliative medicine in the setting of tongue cancer for which pt has had resection, reconstruction of mandible, and is now to start XRT.  He came for XRT 12/18/20 but was able to tolerate due to mask.  He was prescribed 0.5mg  ativan by Dr. Sudie Bailey and took 1 tablet prior to visit.  He was also prescribed liquid oxycodone, 10mg , by Dr. , but has not picked this up due to cost ($92.00).  He also takes ibuprofen 800mg  twice daily.  Is also out of this and is picking it up today.  He is quite anxious regarding XRT and the mask.  He also states he feels as if he is becoming more depressed the longer this goes on.  Does not feel as if he would harm himself or others, but is frustrated that he was actively employed, enjoyed talking with people, and being sociable and now his quality of life is 'in the toilet'.  His ex-wife is his caregiver.    Interval History:  Patient seen prior to XRT today.  He is in a wheelchair and accompanied by his ex-wife/caregiver.  Patient states he is feeling 'ok' but is certainly better than 01/31/21 when he cancelled his XRT.  states he had a rough night and just couldn't make it. Diarrhea has almost completely resolved.  States while imodium was completely ineffective, lomotil was effective immediately.  States has had 2 loose BM's in 2 days.   For his throat pain, he has been taking  ibuprofen a couples times per day and oxycodone, usually 1-4x daily. Rates 4/10 today.  He denies nausea.  Weight is stable today at 89#.   He continues Zoloft 50mg  daily.  He has leg wound from surgery.  Is concerned that it is not healing.  States never went to wound center as referred as he had to cx appointment.  Would like to be referred back to [facility].  He is receiving IVF intermittently.     Review of Systems:  Review of Systems   Constitutional: Positive for malaise/fatigue.   HENT: Positive for sore throat.         Mouth pain  Dry mouth   Eyes: Negative.    Respiratory: Positive for cough.    Cardiovascular: Negative.    Gastrointestinal: Positive for diarrhea. Negative for nausea.        Resolving   Genitourinary: Negative.    Musculoskeletal: Negative.    Skin: Negative.         LLE wound   Neurological: Negative.    Endo/Heme/Allergies: Negative.    Psychiatric/Behavioral: Positive for depression. Negative for suicidal ideas.         Allergies   Allergen Reactions   ??? Cat Dander Swelling     Past Medical History:   Diagnosis Date   ???  Chronic obstructive pulmonary disease (HCC)     follows with Pulmonology.  daily and rescure inhaler. no home O2.   ??? Emphysema lung (HCC)    ??? HTN (hypertension)    ??? Tongue cancer [facility])      Past Surgical History:   Procedure Laterality Date   ??? HX HERNIA REPAIR  05/2019     Family History   Problem Relation Age of Onset   ??? Dementia Mother      Social History     Socioeconomic History   ??? Marital status: SINGLE     Spouse name: Not on file   ??? Number of children: Not on file   ??? Years of education: Not on file   ??? Highest education level: Not on file   Occupational History   ??? Occupation: [employer]   Tobacco Use   ??? Smoking status: Current Every Day Smoker     Packs/day: 0.75     Years: 45.00     Pack years: 33.75   ??? Smokeless tobacco: Never Used   ??? Tobacco comment: stopped but restarted Dec 2019   Vaping Use   ??? Vaping Use: Never used   Substance  and Sexual Activity   ??? Alcohol use: Yes     Alcohol/week: 1.0 standard drink     Types: 1 Cans of beer per week     Comment: occ   ??? Drug use: Never   ??? Sexual activity: Not on file     Comment: widow   Other Topics Concern   ??? Not on file   Social History Narrative   ??? Not on file     Social Determinants of Health     Financial Resource Strain:    ??? Difficulty of Paying Living Expenses: Not on file   Food Insecurity:    ??? Worried About Running Out of Food in the Last Year: Not on file   ??? Ran Out of Food in the Last Year: Not on file   Transportation Needs:    ??? Lack of Transportation (Medical): Not on file   ??? Lack of Transportation (Non-Medical): Not on file   Physical Activity:    ??? Days of Exercise per Week: Not on file   ??? Minutes of Exercise per Session: Not on file   Stress:    ??? Feeling of Stress : Not on file   Social Connections:    ??? Frequency of Communication with Friends and Family: Not on file   ??? Frequency of Social Gatherings with Friends and Family: Not on file   ??? Attends Religious Services: Not on file   ??? Active Member of Clubs or Organizations: Not on file   ??? Attends Banker Meetings: Not on file   ??? Marital Status: Not on file   Intimate Partner Violence:    ??? Fear of Current or Ex-Partner: Not on file   ??? Emotionally Abused: Not on file   ??? Physically Abused: Not on file   ??? Sexually Abused: Not on file   Housing Stability:    ??? Unable to Pay for Housing in the Last Year: Not on file   ??? Number of Places Lived in the Last Year: Not on file   ??? Unstable Housing in the Last Year: Not on file     Current Outpatient Medications   Medication Sig Dispense Refill   ??? LORazepam (ATIVAN) 0.5 mg tablet Take 1 Tablet by mouth daily. Max Daily Amount:  0.5 mg. 30 Tablet 0   ??? oxyCODONE IR (ROXICODONE) 10 mg tab immediate release tablet Take 1 Tablet by mouth every four (4) hours as needed for Pain for up to 30 days. Max Daily Amount: 60 mg. 90 Tablet 0   ??? artificial saliva (MOUTH KOTE)  spra Take 1 Spray by mouth as needed (dry mouth). 59 mL 1   ??? diphenoxylate-atropine (LomotiL) 2.5-0.025 mg per tablet Take 1 Tablet by mouth four (4) times daily as needed for Diarrhea. Max Daily Amount: 4 Tablets. 60 Tablet 5   ??? mirtazapine (REMERON) 15 mg tablet Take 1 tablet by mouth nightly 90 Tablet 0   ??? azithromycin (ZITHROMAX) 500 mg tab TAKE 1 TABLET BY MOUTH ONCE DAILY FOR 3 DAYS 3 Tablet 0   ??? ibuprofen (MOTRIN) 800 mg tablet Take 1 tablet by mouth twice daily as needed for pain 60 Tablet 0   ??? rosuvastatin (CRESTOR) 10 mg tablet Take 1 tablet by mouth nightly 90 Tablet 0   ??? sertraline (Zoloft) 20 mg/mL concentrated solution Take 2.5 mL by mouth daily. Indications: anxiousness associated with depression 60 mL 1   ??? OTHER daily.     ??? Trelegy Ellipta 100-62.5-25 mcg inhaler Take 1 Puff by inhalation daily. 30 Each 3   ??? multivitamin (ONE A DAY) tablet Take 1 Tablet by mouth daily.     ??? melatonin 10 mg tab Take  by mouth. Take at night     ??? cholecalciferol (Vitamin D3) 25 mcg (1,000 unit) cap Take  by mouth daily.     ??? gabapentin (NEURONTIN) 100 mg capsule Take 2 Capsules by mouth three (3) times daily as needed for Pain (Restless leg). 360 Capsule 3   ??? albuterol (PROVENTIL HFA, VENTOLIN HFA, PROAIR HFA) 90 mcg/actuation inhaler Take 2 Puffs by inhalation every six (6) hours as needed for Wheezing. 1 Inhaler 6   ??? albuterol (PROVENTIL VENTOLIN) 2.5 mg /3 mL (0.083 %) nebu 3 mL by Nebulization route every six (6) hours as needed for Wheezing. One premix vial every 6hrs prn. 30 Nebule 5       OBJECTIVE:    Visit Vitals  Resp 18   Wt 89 lb (40.4 kg)   BMI 15.28 kg/m??       Physical Exam:  Constitutional: Well developed, thin, frail, cachectic male in no acute distress. Ex-wife present.   HEENT: Normocephalic and atraumatic. Speech difficult to understand due to oral surgery.  Pupils are equal, round, and reactive to light. Extraocular muscles are intact.  Sclerae anicteric. Neck supple without JVD.  Oropharynx erythematous.    Lymph node   deferred   Skin Warm and dry.  No bruising and no rash noted.  No erythema.  No pallor. Wound to left leg not visualized: wrapped in c/d/i kerlix.   Respiratory unlabored respiratory effort.    CVS deferred   Abdomen Soft, nontender and nondistended   Neuro Grossly nonfocal with no obvious sensory or motor deficits.   MSK Normal range of motion in general.  No edema and no tenderness.   Psych Appropriate mood and affect.        Labs:  No results found for this or any previous visit (from the past 24 hour(s)).    Imaging:  No results found for this or any previous visit.    ASSESSMENT:    ICD-10-CM ICD-9-CM    1. Wound of left lower extremity, initial encounter  S81.802A 894.0 REFERRAL TO WOUND CARE   2.  Cancer related pain  G89.3 338.3    3. Xerostomia  K11.7 527.7    4. Fatigue, unspecified type  R53.83 780.79    5. Tongue cancer (HCC)  C02.9 141.9    6. Diarrhea, unspecified type  R19.7 787.91    7. Encounter for palliative care  Z51.5 V66.7      Problem List  Date Reviewed: 02/06/21          Codes Class Noted    Dehydration ICD-10-CM: E86.0  ICD-9-CM: 276.51  01/17/2021        Tongue cancer ([place]) ICD-10-CM: C02.9  ICD-9-CM: 141.9  06/27/2020        History of tobacco abuse ICD-10-CM: Z87.891  ICD-9-CM: V15.82  09/28/2019    Overview Signed 06/27/2020  6:01 PM by Rayann Heman, MD     Last Assessment & Plan:   Formatting of this note might be different from the original.  After assessing the patient's willingness to quit, the patient was counseled for 4 minutes on tobacco cessation including the impact of smoking on health, the benefits of quitting, and various options available for quitting including medications, behavioral modifications, and support. The patient has not chosen a quit date.    He meets criteria for lung cancer screening. Will plan for shared decision making and ordering of LDCT for 09/2020 at an upcoming visit.             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  ECHO 06/08/2019:  Interpretation Summary     ?? The left ventricular systolic function is normal (55-65%).  ?? Grade I (mild) left ventricular diastolic dysfunction present, consistent with impaired relaxation.  ?? The right ventricular systolic function is normal.  ?? 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 Mild-to-moderate mitral valve regurgitation. Consider TEE for further evaluation.  ?? Myxomatous tricuspid valve with mild regurgitation.  ?? Normal chamber sizes.  ?? There is moderate pulmonary hypertension.     Mild to moderate MR. Will need ongoing surveillance, no intervention at this time.             Centrilobular emphysema ([place]) ICD-10-CM: J43.2  ICD-9-CM: 492.8  06/09/2019    Overview Signed 06/27/2020  6:01 PM by Rayann Heman, MD     Last Assessment & Plan:   Formatting of this note might be different from the original.  Very severe obstruction in a current smoker. Smoking cessation strongly encouraged. Will discontinue his Dulera and start Trelegy. Continue PRN SABA. 71mwt on return visit.             Unstable angina (HCC) ICD-10-CM: I20.0  ICD-9-CM: 411.1  06/08/2019    Overview Signed 06/27/2020  6:01 PM by Rayann Heman, MD     Formatting of this note might be different from the original.  Added automatically from request for surgery [phone]             Emphysema lung [facility]) ICD-10-CM: J43.9  ICD-9-CM: 492.8  Unknown                PLAN:  Lab studies and imaging studies were personally reviewed.    Pertinent old records were reviewed from  [facility].     1. Pain: Continue oxycodone 10mg  tablet q 4 hours prn; no Rx needed today    2. Anxiety: Continue Ativan 0.5mg  daily prior to XRT per Dr. . No Rx needed today.    3.  Depression: Continue Zoloft 50mg  daily.    4. Left leg wound: wound care follow-up at [facility]. Referral placed today.    5. Diarrhea: lomotil prn    6. Dry Mouth: mouthkote spray prn    Advanced Care Planning Discussed: None today.    Will follow up in: 1 week    I have reviewed the patient's controlled substance prescription history, as maintained in the prescription monitoring program, so that the prescriptions(s) for a controlled substance can be given.  Last Date Reviewed: 02/02/21          [place], NP  Outpatient Palliative Care at the  [facility]  [address]  [place] [address]  Office : [phone]  Fax : [phone]

## 2021-02-03 ENCOUNTER — Encounter

## 2021-02-04 MED ORDER — IBUPROFEN 800 MG TAB
800 mg | ORAL_TABLET | ORAL | 0 refills | Status: DC
Start: 2021-02-04 — End: 2021-04-18

## 2021-02-05 ENCOUNTER — Inpatient Hospital Stay: Admit: 2021-02-05 | Payer: MEDICARE | Primary: Family Medicine

## 2021-02-05 ENCOUNTER — Encounter

## 2021-02-06 ENCOUNTER — Ambulatory Visit: Attending: Family Medicine | Primary: Family Medicine

## 2021-02-06 ENCOUNTER — Ambulatory Visit: Attending: Family | Primary: Family Medicine

## 2021-02-06 ENCOUNTER — Ambulatory Visit: Primary: Family Medicine

## 2021-02-06 ENCOUNTER — Ambulatory Visit: Admit: 2021-02-06 | Discharge: 2021-02-06 | Payer: MEDICARE | Attending: Family Medicine | Primary: Family Medicine

## 2021-02-06 ENCOUNTER — Ambulatory Visit: Admit: 2021-02-06 | Discharge: 2021-02-06 | Payer: MEDICARE | Primary: Family Medicine

## 2021-02-06 ENCOUNTER — Inpatient Hospital Stay: Admit: 2021-02-06 | Payer: MEDICARE | Primary: Family Medicine

## 2021-02-06 ENCOUNTER — Ambulatory Visit: Admit: 2021-02-06 | Discharge: 2021-02-06 | Payer: MEDICARE | Attending: Family | Primary: Family Medicine

## 2021-02-06 DIAGNOSIS — G893 Neoplasm related pain (acute) (chronic): Secondary | ICD-10-CM

## 2021-02-06 DIAGNOSIS — Z008 Encounter for other general examination: Secondary | ICD-10-CM

## 2021-02-06 DIAGNOSIS — Z510 Encounter for antineoplastic radiation therapy: Secondary | ICD-10-CM

## 2021-02-06 MED ORDER — OXYCODONE 10 MG TAB
10 mg | ORAL_TABLET | Freq: Four times a day (QID) | ORAL | 0 refills | Status: AC | PRN
Start: 2021-02-06 — End: 2021-04-08

## 2021-02-06 MED ORDER — TRAZODONE 50 MG TAB
50 mg | ORAL_TABLET | Freq: Every evening | ORAL | 3 refills | Status: AC
Start: 2021-02-06 — End: ?

## 2021-02-06 MED ORDER — SERTRALINE 20 MG/ML ORAL CONCENTRATE
20 mg/mL | Freq: Every day | ORAL | 0 refills | Status: DC
Start: 2021-02-06 — End: 2021-02-06
  Filled 2021-02-06: qty 60, 24d supply, fill #0

## 2021-02-06 MED ORDER — SERTRALINE 20 MG/ML ORAL CONCENTRATE
20 mg/mL | Freq: Every day | ORAL | 0 refills | Status: DC
Start: 2021-02-06 — End: 2021-04-04

## 2021-02-06 MED ORDER — OXYCODONE 10 MG TAB
10 mg | ORAL_TABLET | Freq: Four times a day (QID) | ORAL | 0 refills | Status: DC | PRN
Start: 2021-02-06 — End: 2021-04-13

## 2021-02-06 MED ORDER — GABAPENTIN 100 MG CAP
100 mg | ORAL_CAPSULE | Freq: Three times a day (TID) | ORAL | 3 refills | Status: AC | PRN
Start: 2021-02-06 — End: ?

## 2021-02-06 NOTE — Progress Notes (Signed)
Nutrition F/U:  Assessment:  Pt seen prior to radiation, has completed 28 of 30 fractions.  Pt reports taking Lomotil w/ much improved control over his diarrhea, reports bolus feeding 4 cartons/day of Nutren 1.5 - was doing 6 cartons last week, but not as hungry w/ less diarrhea.  Pt continues to take water po via syringe, doing 4 doses of liquid Promod.  Pt has not yet received his MIC-KEY button replacement tube and extension sets - call placed to [facility] and pt and caregiver did not answer phone call last week to authorize shipment.  No labs obtained today. Current BW: 89#, up 5# over the past 2 weeks - pt no showed for XRT and RD visit last week.     Intervention:  1. Continue w/ liquids via syringe - still has home health SLP for another 2 weeks per pt.  Will refer to OP SLP once discharged.   2. Continue w/ Lomotil for diarrhea  3. Continue w/ bolus TF of Nutren 1.5 - 4-6 cartons/day, continue w/ liquid protein w/ all feeds.     Monitoring/Evaluation:  1. RD to follow up during next office visit - follow up wt status, tolerance/intake of TF, symptom management.      [place], [place], [place]

## 2021-02-06 NOTE — Progress Notes (Signed)
Outpatient Palliative Care at the  [facility]: Office Visit Established Patient    Diagnosis: tongue cancer    Treatment Plan: s/p resection and reconstruction of mandible 09/27/20--> 28/30    Treatment Intent: curative    Medical Oncologist: Dr. Henrene Hawking (no chemotherapy)    Radiation Oncologist: Dr. Sudie Bailey    Navigator: Ottis Stain, RD    Surgeon: Dr. Morrie Sheldon [facility])      Chief Complaint:    Chief Complaint   Patient presents with   ??? Diarrhea     History of Present Illness:  Caleb Robinson is a 70 y.o. male who presents today for evaluation regarding pain and symptom management and introduction to palliative medicine in the setting of tongue cancer for which pt has had resection, reconstruction of mandible, and is now to start XRT.  He came for XRT 12/18/20 but was able to tolerate due to mask.  He was prescribed 0.5mg  ativan by Dr. Sudie Bailey and took 1 tablet prior to visit.  He was also prescribed liquid oxycodone, 10mg , by Dr. , but has not picked this up due to cost ($92.00).  He also takes ibuprofen 800mg  twice daily.  Is also out of this and is picking it up today.  He is quite anxious regarding XRT and the mask.  He also states he feels as if he is becoming more depressed the longer this goes on.  Does not feel as if he would harm himself or others, but is frustrated that he was actively employed, enjoyed talking with people, and being sociable and now his quality of life is 'in the toilet'.  His ex-wife is his caregiver.    Interval History:  Patient seen in coordination with his office visit with Dr Nils Flack.  [name] is with him today.  Patient has completed 28/30 fractions of radiation.  His loose stools are well controlled with Lomotil.  He has mild-moderate throat pain.  He takes ibuprofen twice a day and oxycodone 10mg  2-3 times per day.  He continues Zoloft 50mg  daily with good control of anxiety and depression.  He has not yet been to [facility], but  plans to call them today.  He also has an appointment with PCP today.    Review of Systems:  Review of Systems   Constitutional: Positive for malaise/fatigue.   HENT: Positive for sore throat.         Mouth pain  Dry mouth   Eyes: Negative.    Respiratory: Positive for cough.    Cardiovascular: Negative.    Gastrointestinal: Positive for diarrhea. Negative for nausea.        Improved   Genitourinary: Negative.    Musculoskeletal: Negative.    Skin: Negative.         LLE wound   Neurological: Negative.    Endo/Heme/Allergies: Negative.    Psychiatric/Behavioral: Positive for depression. Negative for suicidal ideas.        Controlled         Allergies   Allergen Reactions   ??? Cat Dander Swelling     Past Medical History:   Diagnosis Date   ??? Chronic obstructive pulmonary disease (HCC)     follows with Pulmonology.  daily and rescure inhaler. no home O2.   ??? Emphysema lung (HCC)    ??? HTN (hypertension)    ??? Tongue cancer [facility])      Past Surgical History:   Procedure Laterality Date   ??? HX HERNIA REPAIR  05/2019  Family History   Problem Relation Age of Onset   ??? Dementia Mother      Social History     Socioeconomic History   ??? Marital status: SINGLE     Spouse name: Not on file   ??? Number of children: Not on file   ??? Years of education: Not on file   ??? Highest education level: Not on file   Occupational History   ??? Occupation: [employer]   Tobacco Use   ??? Smoking status: Current Every Day Smoker     Packs/day: 0.75     Years: 45.00     Pack years: 33.75   ??? Smokeless tobacco: Never Used   ??? Tobacco comment: stopped but restarted Dec 2019   Vaping Use   ??? Vaping Use: Never used   Substance and Sexual Activity   ??? Alcohol use: Yes     Alcohol/week: 1.0 standard drink     Types: 1 Cans of beer per week     Comment: occ   ??? Drug use: Never   ??? Sexual activity: Not on file     Comment: widow   Other Topics Concern   ??? Not on file   Social History Narrative   ??? Not on file     Social Determinants of Health     Financial  Resource Strain:    ??? Difficulty of Paying Living Expenses: Not on file   Food Insecurity:    ??? Worried About Running Out of Food in the Last Year: Not on file   ??? Ran Out of Food in the Last Year: Not on file   Transportation Needs:    ??? Lack of Transportation (Medical): Not on file   ??? Lack of Transportation (Non-Medical): Not on file   Physical Activity:    ??? Days of Exercise per Week: Not on file   ??? Minutes of Exercise per Session: Not on file   Stress:    ??? Feeling of Stress : Not on file   Social Connections:    ??? Frequency of Communication with Friends and Family: Not on file   ??? Frequency of Social Gatherings with Friends and Family: Not on file   ??? Attends Religious Services: Not on file   ??? Active Member of Clubs or Organizations: Not on file   ??? Attends Archivist Meetings: Not on file   ??? Marital Status: Not on file   Intimate Partner Violence:    ??? Fear of Current or Ex-Partner: Not on file   ??? Emotionally Abused: Not on file   ??? Physically Abused: Not on file   ??? Sexually Abused: Not on file   Housing Stability:    ??? Unable to Pay for Housing in the Last Year: Not on file   ??? Number of Places Lived in the Last Year: Not on file   ??? Unstable Housing in the Last Year: Not on file     Current Outpatient Medications   Medication Sig Dispense Refill   ??? sertraline (Zoloft) 20 mg/mL concentrated solution Take 2.5 mL by mouth daily. Indications: anxiousness associated with depression 75 mL 0   ??? LORazepam (ATIVAN) 0.5 mg tablet Take 1 Tablet by mouth daily. Max Daily Amount: 0.5 mg. 30 Tablet 0   ??? ibuprofen (MOTRIN) 800 mg tablet Take 1 tablet by mouth twice daily as needed for pain 60 Tablet 0   ??? oxyCODONE IR (ROXICODONE) 10 mg tab immediate release tablet Take 1  Tablet by mouth every four (4) hours as needed for Pain for up to 30 days. Max Daily Amount: 60 mg. 90 Tablet 0   ??? artificial saliva (MOUTH KOTE) spra Take 1 Spray by mouth as needed (dry mouth). 59 mL 1   ??? diphenoxylate-atropine  (LomotiL) 2.5-0.025 mg per tablet Take 1 Tablet by mouth four (4) times daily as needed for Diarrhea. Max Daily Amount: 4 Tablets. 60 Tablet 5   ??? mirtazapine (REMERON) 15 mg tablet Take 1 tablet by mouth nightly 90 Tablet 0   ??? azithromycin (ZITHROMAX) 500 mg tab TAKE 1 TABLET BY MOUTH ONCE DAILY FOR 3 DAYS 3 Tablet 0   ??? rosuvastatin (CRESTOR) 10 mg tablet Take 1 tablet by mouth nightly 90 Tablet 0   ??? OTHER daily.     ??? Trelegy Ellipta 100-62.5-25 mcg inhaler Take 1 Puff by inhalation daily. 30 Each 3   ??? multivitamin (ONE A DAY) tablet Take 1 Tablet by mouth daily.     ??? melatonin 10 mg tab Take  by mouth. Take at night     ??? cholecalciferol (Vitamin D3) 25 mcg (1,000 unit) cap Take  by mouth daily.     ??? gabapentin (NEURONTIN) 100 mg capsule Take 2 Capsules by mouth three (3) times daily as needed for Pain (Restless leg). 360 Capsule 3   ??? albuterol (PROVENTIL HFA, VENTOLIN HFA, PROAIR HFA) 90 mcg/actuation inhaler Take 2 Puffs by inhalation every six (6) hours as needed for Wheezing. 1 Inhaler 6   ??? albuterol (PROVENTIL VENTOLIN) 2.5 mg /3 mL (0.083 %) nebu 3 mL by Nebulization route every six (6) hours as needed for Wheezing. One premix vial every 6hrs prn. 30 Nebule 5       OBJECTIVE:  Vitals 02/06/2021   Blood Pressure 93/54   Pulse 60   Temp 97.5   Resp    Height    Weight 86 lb 6.4 oz   SpO2 95   BSA 1.33 m2   BMI 14.83 kg/m2       Physical Exam:  Constitutional: Well developed, thin, frail, cachectic male in no acute distress. Ex-wife present.   HEENT: Normocephalic and atraumatic. Speech difficult to understand due to oral surgery.  Pupils are equal, round, and reactive to light. Extraocular muscles are intact.  Sclerae anicteric. Neck supple without JVD.   Lymph node   deferred   Skin Warm and dry.  No bruising and no rash noted.  No erythema.  No pallor. Wound to left leg not visualized: wrapped in c/d/i kerlix.   Respiratory unlabored respiratory effort.    CVS deferred   Abdomen Soft, nontender and  nondistended   Neuro Grossly nonfocal with no obvious sensory or motor deficits.   MSK Normal range of motion in general.  No edema and no tenderness.   Psych Appropriate mood and affect.        Labs:  No results found for this or any previous visit (from the past 24 hour(s)).    Imaging:  No results found for this or any previous visit.    ASSESSMENT:    ICD-10-CM ICD-9-CM    1. 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 Diarrhea, unspecified type  R19.7 787.91    4. Tongue cancer (HCC)  C02.9 141.9    5. Encounter for palliative care  Z51.5 V66.7 sertraline (Zoloft) 20 mg/mL concentrated solution  Problem List  Date Reviewed: 02/06/2021          Codes Class Noted    Dehydration ICD-10-CM: E86.0  ICD-9-CM: 276.51  01/17/2021        Tongue cancer ([place]) ICD-10-CM: C02.9  ICD-9-CM: 141.9  06/27/2020        History of tobacco abuse ICD-10-CM: Z87.891  ICD-9-CM: V15.82  09/28/2019    Overview Signed 06/27/2020  6:01 PM by Rayann Heman, MD     Last Assessment & Plan:   Formatting of this note might be different from the original.  After assessing the patient's willingness to quit, the patient was counseled for 4 minutes on tobacco cessation including the impact of smoking on health, the benefits of quitting, and various options available for quitting including medications, behavioral modifications, and support. The patient has not chosen a quit date.    He meets criteria for lung cancer screening. Will plan for shared decision making and ordering of LDCT for 09/2020 at an upcoming visit.             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  ECHO 06/08/2019:  Interpretation Summary     ?? The left  ventricular systolic function is normal (55-65%).  ?? Grade I (mild) left ventricular diastolic dysfunction present, consistent with impaired relaxation.  ?? The right ventricular systolic function is normal.  ?? 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 Mild-to-moderate mitral valve regurgitation. Consider TEE for further evaluation.  ?? Myxomatous tricuspid valve with mild regurgitation.  ?? Normal chamber sizes.  ?? There is moderate pulmonary hypertension.     Mild to moderate MR. Will need ongoing surveillance, no intervention at this time.             Centrilobular emphysema ([place]) ICD-10-CM: J43.2  ICD-9-CM: 492.8  06/09/2019    Overview Signed 06/27/2020  6:01 PM by Rayann Heman, MD     Last Assessment & Plan:   Formatting of this note might be different from the original.  Very severe obstruction in a current smoker. Smoking cessation strongly encouraged. Will discontinue his Dulera and start Trelegy. Continue PRN SABA. 46mwt on return visit.             Unstable angina (HCC) ICD-10-CM: I20.0  ICD-9-CM: 411.1  06/08/2019    Overview Signed 06/27/2020  6:01 PM by Rayann Heman, MD     Formatting of this note might be different from the original.  Added automatically from request for surgery [id number]             Emphysema lung [facility]) ICD-10-CM: J43.9  ICD-9-CM: 492.8  Unknown                PLAN:  Lab studies and imaging studies were personally reviewed.    Pertinent old records were reviewed from [facility].     1. Pain: Continue ibuprofen BID.  Continue oxycodone 10mg  every 4 prn.  No refills needed today.    2. Anxiety: Continue Ativan 0.5mg  daily prior to XRT per Dr. Nance Pew. No refill today, and patient verbalizes understanding this will not be continued.    3. Depression: Continue Zoloft 50mg  daily.  Patient  says that Zoloft has been helpful and he wants to continue.  Refill provided today.  He will discuss ongoing management with his PCP today.    4. Left leg wound: He will follow-up with  [facility].    5. Diarrhea: Continue Lomotil prn.  Refills available.       Advanced Care Planning Discussed: None today.    Will follow up in: 2 weeks or earlier prn.    I have reviewed the patient's controlled substance prescription history, as maintained in the [place] prescription monitoring program, so that the prescriptions(s) for a controlled substance can be given.  Last Date Reviewed: 02/06/21          [name] Crigler, NP  Outpatient Palliative Care at the  [facility]  [address] [place] [address]  Office : [phone]  Fax : [phone]

## 2021-02-06 NOTE — Progress Notes (Signed)
Patient: Caleb Robinson MRN: [id number]  SSN: [id number]    Date of Birth: 1951-08-27  Age: 70 y.o.  Sex: male      DIAGNOSIS:  pT3N3b floor of mouth SCCa    TREATMENT SITE:  H&N    DOSE and FRACTIONATION:  28 of 30 fractions; 5600 cGy of 6000 cGy    INTERVAL HISTORY:  Caleb Robinson is a 70 y.o. male being treated for H&N cancer post-op.    Week 1: no complaints   Week 2: noticing more bone being exposed; no pain  Week 3: continues to have diarrhea C. Diff was negative; notes pain  Week 4: pain is controlled; diarrhea not controlled on imodium - GI referral pending  Week 5: pain controlled; diarrhea improved; gaining weight  Week 6: pain control improving; weight stable    OBJECTIVE:  NAD; gr 2 mucositis  Visit Vitals  BP (!) 93/54 (BP 1 Location: Right upper arm, BP Patient Position: Sitting)   Pulse 60   Temp 97.5 ??F (36.4 ??C)   Wt 39.2 kg (86 lb 6.4 oz)   SpO2 95%   BMI 14.83 kg/m??       Lab Results   Component Value Date/Time    Sodium 142 12/12/2020 02:03 PM    Potassium 5.4 (H) 12/12/2020 02:03 PM    Chloride 110 (H) 12/12/2020 02:03 PM    CO2 23 12/12/2020 02:03 PM    Anion gap 9 12/12/2020 02:03 PM    Glucose 77 12/12/2020 02:03 PM    BUN 41 (H) 12/12/2020 02:03 PM    Creatinine 0.90 12/12/2020 02:03 PM    GFR est AA >60 12/12/2020 02:03 PM    GFR est non-AA >60 12/12/2020 02:03 PM    Calcium 9.6 12/12/2020 02:03 PM    Magnesium 2.8 (H) 12/12/2020 02:03 PM    Albumin 3.6 12/12/2020 02:03 PM    Protein, total 6.8 12/12/2020 02:03 PM    Globulin 3.2 12/12/2020 02:03 PM    A-G Ratio 1.1 (L) 12/12/2020 02:03 PM    ALT (SGPT) 82 (H) 12/12/2020 02:03 PM     Lab Results   Component Value Date/Time    WBC 5.7 12/12/2020 02:03 PM    HGB 12.5 (L) 12/12/2020 02:03 PM    HCT 41.4 12/12/2020 02:03 PM    PLATELET 153 12/12/2020 02:03 PM       ASSESSMENT and PLAN:  Caleb Robinson is tolerating radiation as anticipated for the current dose and fraction.  We will continue on as planned with  f/u in 6 weeks with CT. Have reached out to H&N surgery at [facility] re: reconstruction.    Osa Craver, MD   February 06, 2021

## 2021-02-06 NOTE — Progress Notes (Signed)
Caleb Robinson (DOB: 1951-03-09) is a 70 y.o. male, established patient, here for evaluation of the following chief complaint(s):  Hospital Follow Up [facility]) and Other (PHQ 9=14)       ASSESSMENT/PLAN:  Below is the assessment and plan developed based on review of pertinent history, physical exam, labs, studies, and medications.    Diagnoses and all orders for this visit:    1. Cancer related pain  -     oxyCODONE IR (ROXICODONE) 10 mg tab immediate release tablet; Take 1 Tablet by mouth every six (6) hours as needed for Pain for up to 30 days. Max Daily Amount: 40 mg.  -     oxyCODONE IR (ROXICODONE) 10 mg tab immediate release tablet; Take 1 Tablet by mouth every six (6) hours as needed for Pain for up to 30 days. Max Daily Amount: 40 mg.    2. Essential hypertension    3. Primary insomnia    4. Abnormal loss of weight    5. Reactive depression  -     sertraline (Zoloft) 20 mg/mL concentrated solution; Take 2.5 mL by mouth daily. Indications: anxiousness associated with depression    6. Encounter for palliative care  -     sertraline (Zoloft) 20 mg/mL concentrated solution; Take 2.5 mL by mouth daily. Indications: anxiousness associated with depression    7. Pain in both lower extremities  -     gabapentin (NEURONTIN) 100 mg capsule; Take 2 Capsules by mouth three (3) times daily as needed for Pain (Restless leg).  -     oxyCODONE IR (ROXICODONE) 10 mg tab immediate release tablet; Take 1 Tablet by mouth every six (6) hours as needed for Pain for up to 30 days. Max Daily Amount: 40 mg.    Other orders  -     traZODone (DESYREL) 50 mg tablet; Take 1 Tablet by mouth nightly.      Plan:  We agreed to take over his pain medication.  Follow-up in 3 months.    SUBJECTIVE/OBJECTIVE:  70 year old male with a history of COPD.    It was discovered approximately 1 year ago.  He had cancer of the floor of his mouth and tongue.  He is now status post oral cavity resection.  He currently is receiving all nutrition  through his PEG tube.  Has difficulty closing his mouth due to surgery.    He has difficulty speaking because of surgeries.    He is currently undergoing radiation therapy.  Has approximately 2 weeks left.    He has lost weight during this whole process.  Down to approximately 83 pounds.  (His baseline weight was approximately 110 pounds)    His pain is relatively well controlled with Roxicodone.  He is asking if we will take over this medication so he does not have to travel to [place] for so many visits.              Review of Systems   Constitutional: Positive for appetite change and unexpected weight change. Negative for activity change.   Respiratory: Negative.    Cardiovascular: Negative.    Gastrointestinal: Negative.    Musculoskeletal: Positive for arthralgias, myalgias and neck pain.   Psychiatric/Behavioral: Positive for sleep disturbance. Negative for self-injury. The patient is not nervous/anxious.        Physical Exam  Vitals and nursing note reviewed.   Constitutional:       Appearance: He is cachectic. He is ill-appearing. He is not toxic-appearing.  HENT:      Head:      Jaw: Tenderness present.   Cardiovascular:      Rate and Rhythm: Normal rate and regular rhythm.   Pulmonary:      Effort: Pulmonary effort is normal.      Breath sounds: Normal breath sounds.   Neurological:      General: No focal deficit present.      Mental Status: He is alert and oriented to person, place, and time.   Psychiatric:         Mood and Affect: Affect is flat.         Speech: Speech is slurred.         Behavior: Behavior is cooperative.       Vitals:    02/06/21 1653   BP: (!) 82/45   Pulse: (!) 56   Resp: 20   Temp: 97.8 ??F (36.6 ??C)   TempSrc: Temporal   SpO2: (!) 74%   Weight: 83 lb (37.6 kg)   Height: 5\' 4"  (1.626 m)         On this date 02/06/2021 I have spent 30 minutes reviewing previous notes, test results and face to face with the patient discussing the diagnosis and importance of compliance with the  treatment plan as well as documenting on the day of the visit.    An electronic signature was used to authenticate this note.  -- Caleb Robinsons, MD

## 2021-02-07 ENCOUNTER — Encounter

## 2021-02-07 ENCOUNTER — Inpatient Hospital Stay: Admit: 2021-02-07 | Payer: MEDICARE | Primary: Family Medicine

## 2021-02-08 ENCOUNTER — Inpatient Hospital Stay: Admit: 2021-02-08 | Payer: MEDICARE | Primary: Family Medicine

## 2021-02-13 ENCOUNTER — Encounter

## 2021-02-14 ENCOUNTER — Ambulatory Visit: Attending: Family | Primary: Family Medicine

## 2021-02-14 ENCOUNTER — Inpatient Hospital Stay
Admit: 2021-02-14 | Discharge: 2021-02-14 | Disposition: A | Discharge status: Home / Self Care | Payer: MEDICARE | Attending: Student in an Organized Health Care Education/Training Program

## 2021-02-14 ENCOUNTER — Emergency Department: Admit: 2021-02-14 | Payer: MEDICARE | Primary: Family Medicine

## 2021-02-14 ENCOUNTER — Encounter

## 2021-02-14 ENCOUNTER — Ambulatory Visit: Admit: 2021-02-14 | Discharge: 2021-02-14 | Payer: MEDICARE | Attending: Family | Primary: Family Medicine

## 2021-02-14 DIAGNOSIS — J449 Chronic obstructive pulmonary disease, unspecified: Secondary | ICD-10-CM

## 2021-02-14 DIAGNOSIS — N390 Urinary tract infection, site not specified: Secondary | ICD-10-CM

## 2021-02-14 LAB — POC URINE MACROSCOPIC
BILIRUBIN, BILUPC: NEGATIVE
Bilirubin (POC): NEGATIVE
GLUCOSE, GLUUPC: NEGATIVE mg/dL
Glucose, urine (POC): NEGATIVE mg/dL
Ketones (POC): NEGATIVE mg/dL
Ketones: NEGATIVE mg/dL
LEUKOCYTE ESTERASE, LEUKPC: NEGATIVE
Leukocyte esterase (POC): NEGATIVE
NITRITE, NITUPC: NEGATIVE
Nitrite (POC): NEGATIVE
PH, PHUPOC: 6.5 (ref 5.0–9.0)
SPECIFIC GRAVITY, SGUPOC: 1.025 — ABNORMAL HIGH (ref 1.001–1.023)
Spec. gravity (POC): 1.025 — ABNORMAL HIGH (ref 1.001–1.023)
Urobilinogen (POC): 0.2 EU/dL (ref 0.2–1.0)
Urobilinogen, POC: 0.2 EU/dL (ref 0.2–1.0)
pH, urine  (POC): 6.5 (ref 5.0–9.0)

## 2021-02-14 LAB — CBC WITH AUTO DIFFERENTIAL
Basophils %: 2 % (ref 0.0–2.0)
Basophils Absolute: 0.1 10*3/uL (ref 0.0–0.2)
Eosinophils %: 5 % (ref 0.5–7.8)
Eosinophils Absolute: 0.2 10*3/uL (ref 0.0–0.8)
Granulocyte Absolute Count: 0 10*3/uL (ref 0.0–0.5)
Hematocrit: 39.3 % — ABNORMAL LOW (ref 41.1–50.3)
Hemoglobin: 12.1 g/dL — ABNORMAL LOW (ref 13.6–17.2)
Immature Granulocytes: 0 % (ref 0.0–5.0)
Lymphocytes %: 13 % (ref 13–44)
Lymphocytes Absolute: 0.6 10*3/uL (ref 0.5–4.6)
MCH: 30.4 PG (ref 26.1–32.9)
MCHC: 30.8 g/dL — ABNORMAL LOW (ref 31.4–35.0)
MCV: 98.7 FL — ABNORMAL HIGH (ref 79.6–97.8)
MPV: 12.7 FL — ABNORMAL HIGH (ref 9.4–12.3)
Monocytes %: 13 % — ABNORMAL HIGH (ref 4.0–12.0)
Monocytes Absolute: 0.6 10*3/uL (ref 0.1–1.3)
NRBC Absolute: 0 10*3/uL (ref 0.0–0.2)
Neutrophils %: 67 % (ref 43–78)
Neutrophils Absolute: 3.1 10*3/uL (ref 1.7–8.2)
Platelets: 194 10*3/uL (ref 150–450)
RBC: 3.98 M/uL — ABNORMAL LOW (ref 4.23–5.6)
RDW: 16.2 % — ABNORMAL HIGH (ref 11.9–14.6)
WBC: 4.6 10*3/uL (ref 4.3–11.1)

## 2021-02-14 LAB — COMPREHENSIVE METABOLIC PANEL
ALT: 38 U/L (ref 12–65)
AST: 31 U/L (ref 15–37)
Albumin/Globulin Ratio: 0.9 — ABNORMAL LOW (ref 1.2–3.5)
Albumin: 3.4 g/dL (ref 3.2–4.6)
Alkaline Phosphatase: 95 U/L (ref 50–136)
Anion Gap: 2 mmol/L — ABNORMAL LOW (ref 7–16)
BUN: 35 MG/DL — ABNORMAL HIGH (ref 8–23)
CO2: 30 mmol/L (ref 21–32)
Calcium: 9.6 MG/DL (ref 8.3–10.4)
Chloride: 109 mmol/L — ABNORMAL HIGH (ref 98–107)
Creatinine: 0.8 MG/DL (ref 0.8–1.5)
EGFR IF NonAfrican American: >60 mL/min/{1.73_m2} (ref >60)
GFR African American: >60 mL/min/{1.73_m2} (ref >60)
Globulin: 3.8 g/dL — ABNORMAL HIGH (ref 2.3–3.5)
Glucose: 88 mg/dL (ref 65–100)
Potassium: 4.8 mmol/L (ref 3.5–5.1)
Sodium: 141 mmol/L (ref 138–145)
Total Bilirubin: 0.1 MG/DL — ABNORMAL LOW (ref 0.2–1.1)
Total Protein: 7.2 g/dL (ref 6.3–8.2)

## 2021-02-14 LAB — URINALYSIS W/ RFLX MICROSCOPIC
Bilirubin, Urine: NEGATIVE
Bilirubin: NEGATIVE
Casts UA: 0 /lpf
Casts: 0 /lpf
Epithelial Cells, UA: 0 /hpf
Epithelial cells: 0 /hpf
Glucose, Ur: NEGATIVE mg/dL
Glucose: NEGATIVE mg/dL
Ketone: NEGATIVE mg/dL
Ketones, Urine: NEGATIVE mg/dL
Leukocyte Esterase, Urine: NEGATIVE
Leukocyte Esterase: NEGATIVE
Nitrite, Urine: NEGATIVE
Nitrites: NEGATIVE
RBC, UA: 0 /hpf
RBC: 0 /hpf
Urobilinogen, UA, POCT: 0.2 EU/dL (ref 0.2–1.0)
Urobilinogen: 0.2 EU/dL (ref 0.2–1.0)
WBC, UA: >100 /hpf — ABNORMAL HIGH
WBC: >100 /hpf — ABNORMAL HIGH
pH (UA): 6 (ref 5.0–9.0)
pH, UA: 6 (ref 5.0–9.0)

## 2021-02-14 LAB — LACTIC ACID
Lactic Acid: 1.8 MMOL/L (ref 0.4–2.0)
Lactic acid: 1.8 MMOL/L (ref 0.4–2.0)

## 2021-02-14 LAB — MAGNESIUM
Magnesium: 2.4 mg/dL (ref 1.8–2.4)
Magnesium: 2.4 mg/dL (ref 1.8–2.4)

## 2021-02-14 LAB — METABOLIC PANEL, COMPREHENSIVE
A-G Ratio: 0.9 — ABNORMAL LOW (ref 1.2–3.5)
ALT (SGPT): 38 U/L (ref 12–65)
AST (SGOT): 31 U/L (ref 15–37)
Albumin: 3.4 g/dL (ref 3.2–4.6)
Alk. phosphatase: 95 U/L (ref 50–136)
Anion gap: 2 mmol/L — ABNORMAL LOW (ref 7–16)
BUN: 35 MG/DL — ABNORMAL HIGH (ref 8–23)
Bilirubin, total: 0.1 MG/DL — ABNORMAL LOW (ref 0.2–1.1)
CO2: 30 mmol/L (ref 21–32)
Calcium: 9.6 MG/DL (ref 8.3–10.4)
Chloride: 109 mmol/L — ABNORMAL HIGH (ref 98–107)
Creatinine: 0.8 MG/DL (ref 0.8–1.5)
GFR est AA: >60 mL/min/{1.73_m2} (ref >60)
GFR est non-AA: >60 mL/min/{1.73_m2} (ref >60)
Globulin: 3.8 g/dL — ABNORMAL HIGH (ref 2.3–3.5)
Glucose: 88 mg/dL (ref 65–100)
Potassium: 4.8 mmol/L (ref 3.5–5.1)
Protein, total: 7.2 g/dL (ref 6.3–8.2)
Sodium: 141 mmol/L (ref 138–145)

## 2021-02-14 LAB — CBC WITH AUTOMATED DIFF
ABS. BASOPHILS: 0.1 10*3/uL (ref 0.0–0.2)
ABS. EOSINOPHILS: 0.2 10*3/uL (ref 0.0–0.8)
ABS. IMM. GRANS.: 0 10*3/uL (ref 0.0–0.5)
ABS. LYMPHOCYTES: 0.6 10*3/uL (ref 0.5–4.6)
ABS. MONOCYTES: 0.6 10*3/uL (ref 0.1–1.3)
ABS. NEUTROPHILS: 3.1 10*3/uL (ref 1.7–8.2)
ABSOLUTE NRBC: 0 10*3/uL (ref 0.0–0.2)
BASOPHILS: 2 % (ref 0.0–2.0)
EOSINOPHILS: 5 % (ref 0.5–7.8)
HCT: 39.3 % — ABNORMAL LOW (ref 41.1–50.3)
HGB: 12.1 g/dL — ABNORMAL LOW (ref 13.6–17.2)
IMMATURE GRANULOCYTES: 0 % (ref 0.0–5.0)
LYMPHOCYTES: 13 % (ref 13–44)
MCH: 30.4 PG (ref 26.1–32.9)
MCHC: 30.8 g/dL — ABNORMAL LOW (ref 31.4–35.0)
MCV: 98.7 FL — ABNORMAL HIGH (ref 79.6–97.8)
MONOCYTES: 13 % — ABNORMAL HIGH (ref 4.0–12.0)
MPV: 12.7 FL — ABNORMAL HIGH (ref 9.4–12.3)
NEUTROPHILS: 67 % (ref 43–78)
PLATELET: 194 10*3/uL (ref 150–450)
RBC: 3.98 M/uL — ABNORMAL LOW (ref 4.23–5.6)
RDW: 16.2 % — ABNORMAL HIGH (ref 11.9–14.6)
WBC: 4.6 10*3/uL (ref 4.3–11.1)

## 2021-02-14 MED ORDER — SODIUM CHLORIDE 0.9 % IV PIGGY BACK
1 gram | INTRAVENOUS | Status: AC
Start: 2021-02-14 — End: 2021-02-14
  Administered 2021-02-14: 20:00:00 via INTRAVENOUS

## 2021-02-14 MED ORDER — CEFPODOXIME 100 MG TAB
100 mg | ORAL_TABLET | Freq: Two times a day (BID) | ORAL | 0 refills | Status: DC
Start: 2021-02-14 — End: 2021-02-14

## 2021-02-14 MED ORDER — CEFPODOXIME 100 MG TAB
100 mg | ORAL_TABLET | Freq: Two times a day (BID) | ORAL | 0 refills | Status: AC
Start: 2021-02-14 — End: 2021-02-24

## 2021-02-14 MED ORDER — ALBUTEROL SULFATE HFA 90 MCG/ACTUATION AEROSOL INHALER
90 mcg/actuation | Freq: Four times a day (QID) | RESPIRATORY_TRACT | 11 refills | Status: AC | PRN
Start: 2021-02-14 — End: ?

## 2021-02-14 MED ORDER — IPRATROPIUM-ALBUTEROL 2.5 MG-0.5 MG/3 ML NEB SOLUTION
2.5 mg-0.5 mg/3 ml | INHALATION_SOLUTION | Freq: Four times a day (QID) | RESPIRATORY_TRACT | 5 refills | Status: AC | PRN
Start: 2021-02-14 — End: ?

## 2021-02-14 MED ORDER — BUDESONIDE 0.5 MG/2 ML NEB SUSPENSION
0.5 mg/2 mL | Freq: Two times a day (BID) | RESPIRATORY_TRACT | 11 refills | Status: AC
Start: 2021-02-14 — End: ?

## 2021-02-14 MED ORDER — SODIUM CHLORIDE 0.9% BOLUS IV
0.9 % | Freq: Once | INTRAVENOUS | Status: AC
Start: 2021-02-14 — End: 2021-02-14
  Administered 2021-02-14: 15:00:00 via INTRAVENOUS

## 2021-02-14 MED ORDER — SODIUM CHLORIDE 0.9 % IJ SYRG
Freq: Three times a day (TID) | INTRAMUSCULAR | Status: DC
Start: 2021-02-14 — End: 2021-02-14

## 2021-02-14 MED ORDER — SODIUM CHLORIDE 0.9 % IJ SYRG
INTRAMUSCULAR | Status: DC | PRN
Start: 2021-02-14 — End: 2021-02-14

## 2021-02-14 MED ORDER — SODIUM CHLORIDE 0.9% BOLUS IV
0.9 % | Freq: Once | INTRAVENOUS | Status: AC
Start: 2021-02-14 — End: 2021-02-14
  Administered 2021-02-14: 18:00:00 via INTRAVENOUS

## 2021-02-14 MED FILL — CEFTRIAXONE 1 GRAM SOLUTION FOR INJECTION: 1 gram | INTRAMUSCULAR | Qty: 1

## 2021-02-14 NOTE — ED Notes (Signed)
Pt states not able to urinate at this time, will try for this rn in 15 minutes

## 2021-02-14 NOTE — Progress Notes (Signed)
H&N Navigation:  Received message from rounding Oncologist (Dr. Jenetta Loges) - pt in ED at [name].  Call placed to Dr. Eppie Gibson - pt sent to ED after visit at [facility], pt dehydrated, labs WNL, hypotensive.  Pt does not currently have central line access, request for PICC line placement prior to discharge from ED so pt can receive IVF outpatient; infusion staff had difficulty w/ IV access while pt was receiving radiation.  Referral made to [facility] to initiate home IVF - will receive 1 L NS @ 500 ml/hr x 2 hrs every other day starting (3/10).  Pt active w/ [facility] - receiving SLP services, will initiate RN services at this time for home IVF.  Updated orders faxed to [facility] @ [phone].  Will also arrange follow up for pt w/ myself and Palliative Care early next week.      [place], [place], [place]

## 2021-02-14 NOTE — ED Notes (Signed)
Pt arrives via WC to room. Pt states he has been having poor intake at home and had a lower BP today at home. 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 Pt does still smoke cigarettes. Has feeding tube present. Chronic pain in mouth. Reports dizziness when standing. NAD. Masked.

## 2021-02-14 NOTE — ED Notes (Signed)
PICC line RNs at bedside

## 2021-02-14 NOTE — Progress Notes (Signed)
Progress Notes by Hyman Bible, RN at 02/14/21 1520                Author: Hyman Bible, RN  Service: Vascular Access  Author Type: Registered Nurse       Filed: 02/14/21 1634  Date of Service: 02/14/21 1520  Status: Addendum          Editor: Hyman Bible, RN (Registered Nurse)          Related Notes: Original Note by Hyman Bible, RN (Registered Nurse) filed at 02/14/21 1629                  PICC Placement Note      PRE-PROCEDURE VERIFICATION   Correct Procedure: yes.   Time out completed with assistant Hinton Dyer, RN  and all persons present in agreement with time out.   Correct Site:  yes   Temperature: Temp: 98.4 F (36.9 C), Temperature Source: Temp Source: Axillary     Recent Labs           02/14/21   1008     BUN  35*     CREA  0.80     PLT  194        WBC  4.6        Allergies: Cat dander   Education materials for [facility] Care given to patient or family.      PROCEDURE DETAIL   A single lumen PICC line was started for vascular access. The following documentation is in addition to the PICC properties in the lines/airways flowsheet :   Lot #: [id number]   xylocaine used: yes   Mid-Arm Circumference: 20 (cm)   Internal Catheter Length: 37 (cm)   Internal Catheter Total Length: 37 (cm)   Vein Selection for PICC:right brachial   Central Line Bundle followed yes   Complication Related to Insertion: none   Both the insertion guidewire and ECG guidewire were removed intact all ports have positive blood return and were flush well with normal saline.      The location of the tip of the PICC is verified using ECG technology.  The tip is in the SVC per ECG reading.  See image below.       Line is okay to use: yes

## 2021-02-14 NOTE — ED Notes (Signed)
 I have reviewed discharge instructions with the patient.  The patient verbalized understanding.    Patient left ED via Discharge Method: wheelchair to Home with wife     Opportunity for questions and clarification provided.       1 scripts.         To continue your aftercare when you leave the hospital, you may receive an automated call from our care team to check in on how you are doing.  This is a free service and part of our promise to provide the best care and service to meet your aftercare needs." If you have questions, or wish to unsubscribe from this service please call [phone].  Thank you for Choosing our [facility] Emergency Department.

## 2021-02-14 NOTE — ED Provider Notes (Signed)
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  Patient reports dizziness with standing for 2 to 3 days.  Patient is completely dependent on tube feeds and has not had his normal feed today.  He was being seen in the pulmonary office for follow-up regarding a recent CT scan.  Patient states he has been using tube feeds as prescribed up until this morning.  He denies nausea or vomiting but does report chronic diarrhea.  Reports no blood in the stool and denies pain overall that is new.  He has chronic pain in his mouth status post surgical treatment of his cancer.  He takes in very little orally at this point mainly relying on his tube feeds.  As of late, patient denies fever, chills, chest pain or significant shortness of breath increased from his baseline.           Past Medical History:   Diagnosis Date   ??? Chronic obstructive pulmonary disease (HCC)     follows with Pulmonology.  daily and rescure inhaler. no home O2.   ??? Emphysema lung (HCC)    ??? HTN (hypertension)    ??? Tongue cancer [facility])        Past Surgical History:   Procedure Laterality Date   ??? HX HERNIA REPAIR  05/2019         Family History:   Problem Relation Age of Onset   ??? Dementia Mother        Social History     Socioeconomic History   ??? Marital status: SINGLE     Spouse name: Not on file   ??? Number of children: Not on file   ??? Years of education: Not on file   ??? Highest education level: Not on file   Occupational History   ??? Occupation: [employer]   Tobacco Use   ??? Smoking status: Current Every Day Smoker     Packs/day: 0.75     Years: 45.00     Pack years: 33.75   ??? Smokeless tobacco: Never Used   ??? Tobacco comment: stopped but restarted Dec 2019   Vaping Use   ??? Vaping Use: Never used   Substance and Sexual Activity   ??? Alcohol use: Yes     Alcohol/week: 1.0 standard drink     Types: 1 Cans of beer per week      Comment: occ   ??? Drug use: Never   ??? Sexual activity: Not Currently     Birth control/protection: None     Comment: widow   Other Topics Concern   ??? Not on file   Social History Narrative   ??? Not on file     Social Determinants of Health     Financial Resource Strain:    ??? Difficulty of Paying Living Expenses: Not on file   Food Insecurity:    ??? Worried About Running Out of Food in the Last Year: Not on file   ??? Ran Out of Food in the Last Year: Not on file   Transportation Needs:    ??? Lack of Transportation (Medical): Not on file   ??? Lack of Transportation (Non-Medical): Not on file   Physical Activity:    ??? Days of Exercise per Week: Not on file   ??? Minutes of Exercise per Session: Not on file   Stress:    ??? Feeling of  Stress : Not on file   Social Connections:    ??? Frequency of Communication with Friends and Family: Not on file   ??? Frequency of Social Gatherings with Friends and Family: Not on file   ??? Attends Religious Services: Not on file   ??? Active Member of Clubs or Organizations: Not on file   ??? Attends Archivist Meetings: Not on file   ??? Marital Status: Not on file   Intimate Partner Violence:    ??? Fear of Current or Ex-Partner: Not on file   ??? Emotionally Abused: Not on file   ??? Physically Abused: Not on file   ??? Sexually Abused: Not on file   Housing Stability:    ??? Unable to Pay for Housing in the Last Year: Not on file   ??? Number of Places Lived in the Last Year: Not on file   ??? Unstable Housing in the Last Year: Not on file         ALLERGIES: Cat dander    Review of Systems   Constitutional: Positive for fatigue. Negative for chills, diaphoresis and fever.   HENT: Negative for congestion, sneezing and sore throat.    Eyes: Negative for visual disturbance.   Respiratory: Negative for cough, chest tightness, shortness of breath and wheezing.    Cardiovascular: Negative for chest pain and leg swelling.   Gastrointestinal: Negative for abdominal pain, blood in stool, diarrhea, nausea and  vomiting.   Endocrine: Negative for polyuria.   Genitourinary: Negative for difficulty urinating, dysuria, flank pain, hematuria and urgency.   Musculoskeletal: Negative for back pain, myalgias, neck pain and neck stiffness.   Skin: Negative for color change and rash.   Neurological: Positive for light-headedness. Negative for dizziness, syncope, speech difficulty, weakness, numbness and headaches.   Psychiatric/Behavioral: Negative for behavioral problems.   All other systems reviewed and are negative.      Vitals:    02/14/21 0951   BP: 97/81   Pulse: 65   Resp: 16   Temp: 98.4 ??F (36.9 ??C)   SpO2: 92%            Physical Exam  Vitals and nursing note reviewed.   Constitutional:       General: He is not in acute distress.     Appearance: He is well-developed. He is ill-appearing. He is not diaphoretic.      Comments: Chronically ill, cachectic appearing male patient.  Alert and oriented to person place and time.  No acute distress, verbal response altered secondary to removal of patient's tongue during previous surgery.  Responses are appropriate otherwise strong smell of tobacco smoke at bedside.   HENT:      Head: Normocephalic and atraumatic.      Right Ear: External ear normal.      Left Ear: External ear normal.      Nose: Nose normal.      Mouth/Throat:      Mouth: Mucous membranes are dry.      Comments: Postoperative changes noted on the intraoral exam, tongue is absent, dry, thickened secretions throughout the oral cavity.  Eyes:      Pupils: Pupils are equal, round, and reactive to light.   Cardiovascular:      Rate and Rhythm: Normal rate and regular rhythm.      Heart sounds: Normal heart sounds. No murmur heard.  No friction rub. No gallop.    Pulmonary:      Effort: Pulmonary effort is normal.  No respiratory distress.      Breath sounds: Normal breath sounds. No stridor. No decreased breath sounds, wheezing, rhonchi or rales.   Chest:      Chest wall: No tenderness.   Abdominal:      General: There  is no distension.      Palpations: Abdomen is soft. There is no mass.      Tenderness: There is no abdominal tenderness. There is no guarding or rebound.      Hernia: No hernia is present.   Musculoskeletal:         General: No tenderness or deformity. Normal range of motion.      Cervical back: Normal range of motion.   Skin:     General: Skin is warm and dry.   Neurological:      Mental Status: He is alert and oriented to person, place, and time.      Cranial Nerves: No cranial nerve deficit.          MDM  Number of Diagnoses or Management Options     Amount and/or Complexity of Data Reviewed  Clinical lab tests: ordered and reviewed  Tests in the medicine section of CPT??: reviewed and ordered  Review and summarize past medical records: yes    Risk of Complications, Morbidity, and/or Mortality  Presenting problems: moderate  Diagnostic procedures: low  Management options: moderate      ED Course as of 02/14/21 1003   Wed Feb 14, 2021   1000 EKG interpretation: Sinus arrhythmia, rate of 61, normal axis, no ischemia. [BR]      ED Course User Index  [BR] Debera Lat, DO       Procedures

## 2021-02-14 NOTE — Progress Notes (Signed)
Progress Notes by Thomasene Ripple, NP at 02/14/21 0840                Author: Thomasene Ripple, NP  Service: --  Author Type: Nurse Practitioner       Filed: 02/14/21 0945  Encounter Date: 02/14/2021  Status: Signed          Editor: Vida Nicol, Kathrin Greathouse, NP (Nurse Practitioner)               [facility]   [address] [name] [address]   [phone]      Patient Name:  Caleb Robinson      Date of Birth:   Mar 14, 1951      Office Visit 02/14/2021      HISTORY OF PRESENT ILLNESS:     Mr. Caio Devera in our clinic today who presents with a chief complaint of      Chief Complaint       Patient presents with        ?  COPD        70 year old white male with history of COPD, hypertension and recently noted to have squamous cell carcinoma of the tongue.  Biopsy of tongue mass/ floor mass 06/02/20.  Biopsy  by Dr. Elby Beck did reveal squamous cell cancer.  Patient had a chest CT which demonstrated 8 mm nodule in the right upper lobe and a small endobronchial lesion in the right upper lobe.  PET scan was done and this demonstrated no hypermetabolic activity  in the right upper lobe nodule but there was faint activity noted and a new 7 mm left upper lobe nodule.  There was activity in the tongue area and right submandibular lymph node.  Patient will be going to see oncology at [facility].   Patient has been a 2 pack-a-day smoker until recently when he cut down to 1 pack a day.  He has probably accumulated over 80 pack years.  He does have a cough on a daily basis which is worse over the past couple weeks.  He has rare wheezing and no  chest pain.  He does admit to shortness of breath with activities mainly when walking and trying to carry something for 30 to 40 feet.  He works at [employer].  On flat ground he says he really does not have a problem.  He has been using Trelegy  and feels that that has been more beneficial than [facility] which he had been on before.  He  does have rescue inhaler and has nebulizer but rarely uses it.     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 He is now status post composite oral cavity resection, bilateral neck dissection, fibula free flap and tracheotomy  on 09/28/20. He had PEG placed 10/12/20 after swallow evaluation demonstrated severe oral impairment requiring syringe placement of bolus.  Radiation treatments here in [place], total of 6 weeks.  Felt that pulmonary nodules are stable.    Today, he states his breathing is fairly stable.  He states he has been dizzy on and off.  He no longer eats or drinks anything by mouth and is relying upon his PEG for nutrition.   He states he has been to the infusion center for fluid several times.     He is currently not using any inhaled therapy other than an albuterol inhaler.  Spirometry and last visit here showed  severe COPD.   He continues to smoke a pack a day.  He does have plans to wean this.      ASSESSMENT AND PLAN:  (Medical Decision Making)      Diagnoses and all orders for this visit:      1. Stage 3 severe COPD by GOLD classification (HCC)   --Now s/p fairly radical neck including the majority of the tongue removed and his lips.  Patient has a hard time forming seal around the Trelegy, so we will change meds to nebulized.   --We will do duo nebs as well as Pulmicort.  Patient okay with this plan.      2. Centrilobular emphysema (HCC)   -     albuterol (PROVENTIL HFA, VENTOLIN HFA, PROAIR HFA) 90 mcg/actuation inhaler; Take 2 Puffs by inhalation every six (6) hours as needed for Wheezing.      3. Pulmonary nodule   -     CT CHEST WO CONT; Future   --Stable CT in February 2022 with 8 mm right upper lobe nodule.  We will repeat imaging in 6 months       4. Cigarette nicotine dependence with other nicotine-induced disorder   --Patient plans to stop smoking next month.  His plan is to use the patches.  He and his ex-wife who is here at the visit of the great to try  this.   The patient was counseled on the dangers of tobacco use, and was advised to quit.  Reviewed strategies to maximize success, including  removing cigarettes and smoking materials from environment, stress management, substitution of other forms of reinforcement and pharmacotherapy (nicotine patches).   5. Primary tongue squamous cell carcinoma (HCC)   --Status post surgical excision as well as radiation.      7. Hypotension   --Likely due to poor oral intake.  Patient was cachectic and reliant upon tube feedings.  He has not had anything today.  Blood pressures in the 70s over 40s and patient is bradycardic in the 40s and 50s.  Advised patient that I feel like he should probably  go to the ED for evaluation and fluids.  Patient agrees to this.      Other orders   -     albuterol-ipratropium (DUO-NEB) 2.5 mg-0.5 mg/3 ml nebu; 3 mL by Nebulization route every six (6) hours as needed for Wheezing.   -     budesonide (Pulmicort) 0.5 mg/2 mL nbsp; 2 mL by Nebulization route two (2) times a day.      We will follow up in 3 months.        Orders Placed This Encounter        ?  CT CHEST WO CONT              Standing Status:    Future         Standing Expiration Date:    03/17/2022        ?  albuterol-ipratropium (DUO-NEB) 2.5 mg-0.5 mg/3 ml nebu             Sig: 3 mL by Nebulization route every six (6) hours as needed for Wheezing.         Dispense:  120 Nebule         Refill:  5        ?  budesonide (Pulmicort) 0.5 mg/2 mL nbsp             Sig: 2 mL by Nebulization route  two (2) times a day.         Dispense:  60 Each         Refill:  11        ?  albuterol (PROVENTIL HFA, VENTOLIN HFA, PROAIR HFA) 90 mcg/actuation inhaler             Sig: Take 2 Puffs by inhalation every six (6) hours as needed for Wheezing.         Dispense:  1 Each             Refill:  Glen Raven, NP   Electronically signed      Collaborating physician is Christiana Pellant, MD      AD      Total time spent was 48 minutes.  This time  includes chart prep, review of tests/procedures, review of other provider's notes, documentation, and counseling patient regarding disease process and medications.         ___________________________________________________________            REVIEW OF SYSTEMS:   10 point review of systems is negative except as reported in HPI.      PHYSICAL EXAM:        Vitals:          02/14/21 0814        BP:  (!) 70/40     Pulse:  (!) 52     Temp:  98 ??F (36.7 ??C)     TempSrc:  Temporal     Resp:  20     Height:  5\' 4"  (1.626 m)     Weight:  86 lb (39 kg)        SpO2:  (!) 87%   Comment: Room Air        Body mass index is 14.76 kg/m??.         GENERAL APPEARANCE:  The patient is underweight and in no respiratory distress.   HEENT:  PERRL.  Conjunctivae unremarkable.  Nasal mucosa is without epistaxis, exudate, or polyps.  Partial glossectomy, mouth sores, lip deformities.  TMs are clear.   NECK/LYMPHATIC:  Symmetrical with no JVD.  Trachea midline. No thyroid enlargement.  No cervical adenopathy. Trach scar and scabbing over incision  of upper neck.   LUNGS:  Normal respiratory effort with symmetrical lung expansion.   Breath sounds diminished bilaterally.   HEART:  There is a regular rate and rhythm.  No murmur, rub, or gallop.  There is no edema in the lower extremities.   ABDOMEN:  Soft and non-tender.  No hepatosplenomegaly.  Bowel sounds are normal.     SKIN:  There are no rashes, cyanosis, jaundice, or ecchymosis present.   EXTREMITIES:  The extremities are unremarkable without clubbing, cyanosis, joint inflammation, degenerative, or ischemic change.   MUSCULOSKELETAL:  There is no abnormal tone, muscle atrophy, or abnormal movement present.   NEURO:  The patient is alert and oriented.  Memory appears intact and mood is normal.  No gross sensorimotor deficits are present.         DIAGNOSTIC TESTS:  LABS: No results  for input(s): HGB, HCT, TSH, BNPNT, HGBEXT, HCTEXT, TSHEXT, HGBEXT, HCTEXT, TSHEXT in the last 72 hours.              CT of chest without contrast:   Results for orders placed during the hospital encounter of 01/17/21      CT CHEST WO CONT   01/17/21      05/2020        Narrative   CT of the chest without contrast.      CLINICAL INDICATION: Pulmonary nodules, followup examination.      PROCEDURE: Serial thin section axial images are obtained from the thoracic inlet   through the upper abdomen without the administration of intravenous contrast.   Radiation dose reduction techniques were used for this study. Our CT scanners   use one or all of the following: Automated exposure control, adjusted of the mA   and/or kV according to patient size, iterative reconstruction      COMPARISON: Chest CT dated 07/25/2020 and 05/31/2020      FINDINGS: No mediastinal or axilla adenopathy. There is no pleural or   pericardial effusion. The heart is normal in size. Atherosclerotic   calcifications noted in the aorta. Scarring is noted at the right lung apex.   There is diffuse emphysema. There is a stable 8 mm pulmonary nodule (image 90)   in the anterior right upper lobe. The previous nodular densities along the   posterior aspect of the right upper lobe has resolved. There is increased   thickening along the minor fissure with atelectasis. No definite new pulmonary   nodules evident.      Limited evaluation of the upper abdomen is unremarkable. A PEG tube is in place.      No aggressive osseous is identified.      Impression   1. Stable 8 mm pulmonary nodule in the anterior aspect of the right upper lobe.   Recommend continued imaging surveillance with follow-up CT in six months.   2. The previous 7 mm pulmonary nodule in the more posterior right upper lobe has   resolved.   3. Severe emphysema   4. No acute cardiopulmonary abnormality.         PET/CT: Results for orders placed during the hospital encounter of 06/20/20      PET/CT TUMOR  IMAGE SKULL THIGH W (INI)      Narrative   PET/CT      Indication: Biopsy-proven floor of mouth/ventral tongue squamous cell carcinoma      Radiopharmaceutical: 12.1 mCi F18-FDG, intravenously.      Technique: Imaging was performed from the skull through the proximal thighs   using routine PET/CT acquisition protocol. Imaging was performed approximately   60 minutes post injection. Oral contrast was administered. Radiation dose   reduction techniques were used for this study:  Our CT scanners use one or all   of the following: Automated exposure control, adjustment of the mA and/or kVp   according to patient's size, iterative reconstruction.         Serum glucose: 93 mg/dL prior to injection.      Comparison studies: Chest CT 05/31/2020, CT soft tissue neck 05/03/2020      Findings:      Head and Neck: Elevated tracer uptake associated with an enlarged right level 2A   lymph node is seen on the comparison CT.      There is elevated FDG uptake within the ventral tongue consistent with patient's  known primary cancer.      No contralateral lymphadenopathy.         Chest: The lungs are emphysematous. There is a stable 0.8 cm anteriorly in the   right upper lobe which is not hypermetabolic. Only faint FDG uptake associated   with nodules in the left upper lobe, the larger which is new compared to the   prior chest CT, measuring 0.7 cm, maximum SUV 2.0.      Abdomen/Pelvis: Nonobstructing right renal stone. Aortic atherosclerotic   calcifications without aneurysm. Postsurgical changes anteriorly in the lower   abdomen.      Bones and soft tissues: No aggressive osseous lesion or worrisome focal FDG   uptake.      Impression   1.  Elevated FDG uptake within the ventral tongue consistent with known squamous   cell carcinoma.   2.  Enlarged, FDG avid right level 2A lymph node concerning for metastasis. No   contralateral lymphadenopathy.   3.  Only faint FDG uptake associated with nodules in the left lung, one of which   is  new since the CT from 05/31/2020. Previously seen right lung nodule is not FDG   avid. Differential considerations include sequelae of infection/inflammation   versus metastasis.   4.  No evidence of metastatic disease in the abdomen or pelvis.         Exercise oximetry:  Unable to walk today due to dizziness and hypotension      Spirometry:         Date:     06/20/2020         FVC     1.78-61%         FEV1     0.72-34%         FEV1/FVC     41%         FEF 25-75%     0.33-20%         Bronchodilator Response              TLC              RV                   DLCO                 Echo:   No results found for this or any previous visit.         PMH Reference Info:                                                                                                                 Exposure History:   Second Hand Smoke Exposure: YES   Birds: NO   Asbestos: NO   TB: NO   Hot Tubs/Humidifier: NO   Organic/Inorganic Dusts: NO   Molds: NO   Occupation/Hobbies:      Past Medical History:        Diagnosis  Date         ?  Chronic obstructive pulmonary disease (HCC)            follows with Pulmonology.  daily and rescure inhaler. no home O2.         ?  Emphysema lung (HCC)       ?  HTN (hypertension)           ?  Tongue cancer (HCC)                 Tobacco Use: High Risk        ?  Smoking Tobacco Use: Current Every Day Smoker        ?  Smokeless Tobacco Use: Never Used          Allergies        Allergen  Reactions         ?  Cat Dander  Swelling          Current Outpatient Medications        Medication  Sig         ?  albuterol-ipratropium (DUO-NEB) 2.5 mg-0.5 mg/3 ml nebu  3 mL by Nebulization route every six (6) hours as needed for Wheezing.     ?  budesonide (Pulmicort) 0.5 mg/2 mL nbsp  2 mL by Nebulization route two (2) times a day.     ?  albuterol (PROVENTIL HFA, VENTOLIN HFA, PROAIR HFA) 90 mcg/actuation inhaler  Take 2 Puffs by inhalation every six (6) hours as needed for Wheezing.     ?  traZODone (DESYREL) 50 mg tablet  Take  1 Tablet by mouth nightly.     ?  sertraline (Zoloft) 20 mg/mL concentrated solution  Take 2.5 mL by mouth daily. Indications: anxiousness associated with depression         ?  gabapentin (NEURONTIN) 100 mg capsule  Take 2 Capsules by mouth three (3) times daily as needed for Pain (Restless leg).         ?  oxyCODONE IR (ROXICODONE) 10 mg tab immediate release tablet  Take 1 Tablet by mouth every six (6) hours as needed for Pain for up to 30 days. Max Daily Amount: 40 mg.     ?  [START ON 03/09/2021] oxyCODONE IR (ROXICODONE) 10 mg tab immediate release tablet  Take 1 Tablet by mouth every six (6) hours as needed for Pain for up to 30 days. Max Daily Amount: 40 mg.     ?  ibuprofen (MOTRIN) 800 mg tablet  Take 1 tablet by mouth twice daily as needed for pain     ?  LORazepam (ATIVAN) 0.5 mg tablet  Take 1 Tablet by mouth daily. Max Daily Amount: 0.5 mg.     ?  artificial saliva (MOUTH KOTE) spra  Take 1 Spray by mouth as needed (dry mouth).     ?  diphenoxylate-atropine (LomotiL) 2.5-0.025 mg per tablet  Take 1 Tablet by mouth four (4) times daily as needed for Diarrhea. Max Daily Amount: 4 Tablets.     ?  multivitamin (ONE A DAY) tablet  Take 1 Tablet by mouth daily.         ?  cholecalciferol (Vitamin D3) 25 mcg (1,000 unit) cap  Take  by mouth daily.          No current facility-administered medications for this visit.

## 2021-02-15 NOTE — Progress Notes (Signed)
Ambulatory Care Management Note      Date/Time:  02/15/2021 11:18 AM    This patient was received as a referral from Daily assignment.  Pt/family agree to [facility] services.  Top Challenges reviewed with the provider        Ambulatory Case Manager contacted patient for discussion and case management of oral cancer pt.  Summary of patients top problems:   1. Dehydration  2. Care Coordination - DME, HHC     Patient's challenges to self management identified:   medical condition and multi health system providers      Medication Management:  good adherence    Advance Care Planning:   Does patient have an Advance Directive:  no    [employer], Referrals, and Durable Medical Equipment: IVFs to be delivered today, wife not sure of name of supplier    PCP/Specialist follow up:   Future Appointments   Date Time Provider Department Center   03/19/2021 11:00 AM SFD CT 64 SLICE UNIT 1 [facility]   03/21/2021  1:15 PM Remigio Eisenmenger, RD [facility]   03/21/2021  1:30 PM Federico Flake, MD [facility]   03/21/2021  2:00 PM Roselyn Reef, NP [facility]   05/21/2021  8:40 AM Coutu, Renaldo Harrison, NP SSA PP [facility]   05/23/2021  9:45 AM Murrell Converse, MD SSA [facility]       Goals     . Patient/Family verbalizes understanding of self-management of chronic disease.      1. Pt/family will verbalize prevention of dehydration, s/s to report to physicians w/in 4wks.   2. Pt/family will verbalize resources for care coordination w/ in 4wks.           Patient verbalized understanding of all information discussed. Patient has this Ambulatory Care Manager's contact information for any further questions, concerns, or needs.

## 2021-02-19 LAB — CULTURE, BLOOD 1
Culture: NO GROWTH
Culture: NO GROWTH

## 2021-02-19 LAB — CULTURE, BLOOD
Culture result:: NO GROWTH
Culture result:: NO GROWTH

## 2021-03-07 NOTE — Progress Notes (Signed)
 Ambulatory Care Management Note    Date/Time:  03/07/2021 10:28 AM    This Ambulatory Care Manager (ACM) reviewed and updated the following screenings during this call; general assessment    Patient's challenges to self-management identified:   medical condition    [facility], [facility] supplies tube feeding.   Pt receiving IVFs every other day. Pt reports swelling in feet last wk, cut back IVFs to every 3rd day. Feet swelling has resolved. Pt denies CP or SOB. Pt having difficulty hearing out of rt ear. appt made w/ PCP for 3/31 and pt notified however may need to reschedule per cg, she will call.      [facility] appt 03/28/21.       Medication Management:  good adherence and good understanding    Advance Care Planning:   Does patient have an Advance Directive:  NO    Advertising copywriter, Referrals, and Durable Medical Equipment: [facility], [facility] for IVFs/TFs.    Confirmed w/ [facility] they are unable to change out PEG in home. Pt will address at 03/28/21 appt w/ GI.       Health Maintenance Due   Topic Date Due   . Hepatitis C Screening  Never done   . COVID-19 Vaccine (1) Never done   . DTaP/Tdap/Td series (1 - Tdap) Never done   . Colorectal Cancer Screening Combo  Never done   . Shingrix Vaccine Age 45> (1 of 2) Never done   . Low dose CT lung screening  Never done   . AAA Screening 60-70 YO Male Smoking Patients  Never done   . Pneumococcal 65+ years (1 of 1 - PPSV23) Never done   . Flu Vaccine (1) 08/09/2020     Health Maintenance Reviewed: Yes    Patient was asked to consider health care goals that they would like to focus on with this ACM.   ACM will follow up with patient to discuss goals and establish care plan in the next 7-14 days.     PCP/Specialist follow up:   Future Appointments   Date Time Provider Department Center   03/19/2021 11:00 AM SFD CT 64 SLICE UNIT 1 [facility]   03/21/2021  1:15 PM Duggan, Meagan K, RD [facility]   03/21/2021  1:30 PM Jobe Cancer, MD [facility]   03/21/2021   2:00 PM Sherlyn Delon CROME, NP [facility]   05/21/2021  8:40 AM Cinthia Morna RAMAN, NP [facility]   05/23/2021  9:45 AM Estell Duncans, MD SSA [facility]   07/09/2021  4:45 PM SFD CT 64 SLICE UNIT 1 [facility]DRCT [facility]

## 2021-03-08 ENCOUNTER — Encounter: Payer: MEDICARE | Attending: Family Medicine | Primary: Family Medicine

## 2021-03-19 ENCOUNTER — Inpatient Hospital Stay: Admit: 2021-03-19 | Payer: MEDICARE | Attending: Radiation Oncology | Primary: Family Medicine

## 2021-03-19 DIAGNOSIS — C049 Malignant neoplasm of floor of mouth, unspecified: Secondary | ICD-10-CM

## 2021-03-19 LAB — AMB POC CREATININE
GFR African American: >60 mL/min/{1.73_m2} (ref >60)
GFR Non-African American: >60 mL/min/{1.73_m2} (ref >60)
POC Creatinine: 0.83 mg/dL (ref 0.8–1.5)

## 2021-03-19 LAB — CREATININE, POC
Creatinine (POC): 0.83 mg/dL (ref 0.8–1.5)
GFRAA, POC: >60 mL/min/{1.73_m2} (ref >60)
GFRNA, POC: >60 mL/min/{1.73_m2} (ref >60)

## 2021-03-19 MED ORDER — SALINE PERIPHERAL FLUSH PRN
Freq: Once | INTRAMUSCULAR | Status: AC
Start: 2021-03-19 — End: 2021-03-19
  Administered 2021-03-19: 15:00:00

## 2021-03-19 MED ORDER — IOPAMIDOL 76 % IV SOLN
76 % | Freq: Once | INTRAVENOUS | Status: AC
Start: 2021-03-19 — End: 2021-03-19
  Administered 2021-03-19: 15:00:00 via INTRAVENOUS

## 2021-03-19 MED ORDER — SODIUM CHLORIDE 0.9% BOLUS IV
0.9 % | Freq: Once | INTRAVENOUS | Status: AC
Start: 2021-03-19 — End: 2021-03-19
  Administered 2021-03-19: 15:00:00 via INTRAVENOUS

## 2021-03-20 NOTE — Telephone Encounter (Signed)
Wondering if the patient was still to receive fluids at home every other day. They have a delivery scheduled for tomorrow. Did review with Meagan D. The patient has an appointment here tomorrow and they will review. If orders changing we will send out a new prescription

## 2021-03-21 ENCOUNTER — Ambulatory Visit: Primary: Family Medicine

## 2021-03-21 ENCOUNTER — Ambulatory Visit: Attending: Adult Health | Primary: Family Medicine

## 2021-03-21 ENCOUNTER — Inpatient Hospital Stay: Admit: 2021-03-21 | Payer: MEDICARE | Attending: Radiation Oncology | Primary: Family Medicine

## 2021-03-21 ENCOUNTER — Ambulatory Visit: Admit: 2021-03-21 | Discharge: 2021-03-23 | Payer: MEDICARE | Attending: Adult Health | Primary: Family Medicine

## 2021-03-21 ENCOUNTER — Encounter

## 2021-03-21 ENCOUNTER — Ambulatory Visit: Admit: 2021-03-21 | Discharge: 2021-03-21 | Payer: MEDICARE | Primary: Family Medicine

## 2021-03-21 DIAGNOSIS — Z008 Encounter for other general examination: Secondary | ICD-10-CM

## 2021-03-21 DIAGNOSIS — G893 Neoplasm related pain (acute) (chronic): Secondary | ICD-10-CM

## 2021-03-21 DIAGNOSIS — C049 Malignant neoplasm of floor of mouth, unspecified: Secondary | ICD-10-CM

## 2021-03-21 NOTE — Progress Notes (Signed)
Nutrition F/U:  Assessment:  Pt seen prior to office visit w/ Dr. Sudie Bailey, completed treatment for tongue cancer ~5 weeks ago, CT scan shows post surgical changes and NED in the neck, pt reports recent visit to [facility] - will follow up in ~2 months, likely needs repeat surgery for tissue repair, has follow up (6/8).  Pt continues to take Oxycodone IR (10 mg) for pain - takes 2-4 pills per day.  Pt remains NPO, will bolus fluids via syringe into mouth, receiving 1 L NS every other day at home via PICC line - will continue w/ this as pt is doing well and requests to continue; Pharm D at [facility] updated w/ continued orders, bolus feeding 5 cartons per day of Nutren 1.5 daily, liquid promod with all feeds.  Current BW: 93#, up 4# over the past 5 weeks.     Intervention:  1. Continue w/ liquids via syringe bolus po as tolerated  2. Continue w/ current TF regimen  3. Continue w/ 1 L NS every other day at home provided by [facility], PICC line being managed by home health.   4. PET scan in 5-6 weeks, [facility] in June.  Will follow up with Dr. Henrene Hawking after visit at [facility] for continuation of care/monitoring.     Monitoring/Evaluation:  1. RD to follow up during next office visit - follow up wt status, tolerance/intake of TF, symptom management.      Remigio Eisenmenger RD, CSO, LD  [phone]

## 2021-03-21 NOTE — Progress Notes (Signed)
6 Week Follow Up:  -Post CT  -Palliative Glena Norfolk), Meagan today    RT End: 02/08/2021    History of:  -09/27/2020 - S/P radical neck dissection with tongue excision and mandibular reconstruction with Dr. Elana Alm at [facility]    CT Neck (03/19/2021): negative        Ricka Burdock, [place]

## 2021-03-21 NOTE — Progress Notes (Signed)
Pt will have a PET scan in approx. 6 weeks and will return for FUP. A referral was made to Speech Therapy.

## 2021-03-21 NOTE — Progress Notes (Signed)
Outpatient Palliative Care at the  [facility]: Office Visit Established Patient    Diagnosis: tongue cancer    Treatment Plan: s/p resection and reconstruction of mandible 09/27/20--> 30/30 completed 02/08/21    Treatment Intent: curative    Medical Oncologist: Dr. Sonny Masters (no chemotherapy)    Radiation Oncologist: Dr. Nance Pew    Navigator: Lynnell Chad, RD    Surgeon: Dr. Elana Alm [facility])      Chief Complaint:    Chief Complaint   Patient presents with   ??? Follow-up     History of Present Illness:  Caleb Robinson is a 70 y.o. male who presents today for evaluation regarding pain and symptom management and introduction to palliative medicine in the setting of tongue cancer for which pt has had resection, reconstruction of mandible, and is now to start XRT.  He came for XRT 12/18/20 but was able to tolerate due to mask.  He was prescribed 0.5mg  ativan by Dr. Nance Pew and took 1 tablet prior to visit.  He was also prescribed liquid oxycodone, 10mg , by Dr. Kristopher Glee, but has not picked this up due to cost ($92.00).  He also takes ibuprofen 800mg  twice daily.  Is also out of this and is picking it up today.  He is quite anxious regarding XRT and the mask.  He also states he feels as if he is becoming more depressed the longer this goes on.  Does not feel as if he would harm himself or others, but is frustrated that he was actively employed, enjoyed talking with people, and being sociable and now his quality of life is 'in the toilet'.  His ex-wife is his caregiver.    Interval History:  Patient seen in coordination and prior to his office visit with Dr Nance Pew.  [name] is with him today.  Patient has completed 30/30 fractions of radiation as of 02/08/21.   His loose stools are well controlled with Lomotil.  He has mild-moderate throat pain.  He takes ibuprofen twice a day and oxycodone 10mg  q 6 hours prn.  He recently saw his PCP who has appeared to agree to take over his oxycodone management.  Dr. Judeth Porch  wrote a Rx for oxycodone q 6 hours prn #120.  He continues Zoloft 50mg  daily with good control of anxiety and depression.  He had f/u at [facility] and will see them again for possible surgery in a few months.  Recent scan did not show evidence of disease.  He is in good spirits and is feeling well now that he is receiving IVF regularly via PICC line.    Review of Systems:  Review of Systems   HENT: Positive for sore throat.         Mouth pain  Dry mouth   Eyes: Negative.    Respiratory: Positive for cough.    Cardiovascular: Negative.    Gastrointestinal: Positive for diarrhea. Negative for nausea.        Improved   Genitourinary: Negative.    Musculoskeletal: Negative.    Skin: Negative.    Neurological: Negative.    Endo/Heme/Allergies: Negative.    Psychiatric/Behavioral: Positive for depression. Negative for suicidal ideas.        Controlled         Allergies   Allergen Reactions   ??? Cat Dander Swelling     Past Medical History:   Diagnosis Date   ??? Chronic obstructive pulmonary disease ([place])     follows with Pulmonology.  daily and rescure  inhaler. no home O2.   ??? Emphysema lung ([place])    ??? HTN (hypertension)    ??? Tongue cancer [facility])      Past Surgical History:   Procedure Laterality Date   ??? HX HERNIA REPAIR  05/2019     Family History   Problem Relation Age of Onset   ??? Dementia Mother      Social History     Socioeconomic History   ??? Marital status: SINGLE     Spouse name: Not on file   ??? Number of children: Not on file   ??? Years of education: Not on file   ??? Highest education level: Not on file   Occupational History   ??? Occupation: [employer]   Tobacco Use   ??? Smoking status: Current Every Day Smoker     Packs/day: 0.75     Years: 45.00     Pack years: 33.75   ??? Smokeless tobacco: Never Used   ??? Tobacco comment: stopped but restarted Dec 2019   Vaping Use   ??? Vaping Use: Never used   Substance and Sexual Activity   ??? Alcohol use: Yes     Alcohol/week: 1.0 standard drink     Types: 1 Cans of beer per week      Comment: occ   ??? Drug use: Never   ??? Sexual activity: Not Currently     Birth control/protection: None     Comment: widow   Other Topics Concern   ??? Not on file   Social History Narrative   ??? Not on file     Social Determinants of Health     Financial Resource Strain:    ??? Difficulty of Paying Living Expenses: Not on file   Food Insecurity:    ??? Worried About Running Out of Food in the Last Year: Not on file   ??? Ran Out of Food in the Last Year: Not on file   Transportation Needs:    ??? Lack of Transportation (Medical): Not on file   ??? Lack of Transportation (Non-Medical): Not on file   Physical Activity:    ??? Days of Exercise per Week: Not on file   ??? Minutes of Exercise per Session: Not on file   Stress:    ??? Feeling of Stress : Not on file   Social Connections:    ??? Frequency of Communication with Friends and Family: Not on file   ??? Frequency of Social Gatherings with Friends and Family: Not on file   ??? Attends Religious Services: Not on file   ??? Active Member of Clubs or Organizations: Not on file   ??? Attends Archivist Meetings: Not on file   ??? Marital Status: Not on file   Intimate Partner Violence:    ??? Fear of Current or Ex-Partner: Not on file   ??? Emotionally Abused: Not on file   ??? Physically Abused: Not on file   ??? Sexually Abused: Not on file   Housing Stability:    ??? Unable to Pay for Housing in the Last Year: Not on file   ??? Number of Places Lived in the Last Year: Not on file   ??? Unstable Housing in the Last Year: Not on file     Current Outpatient Medications   Medication Sig Dispense Refill   ??? albuterol-ipratropium (DUO-NEB) 2.5 mg-0.5 mg/3 ml nebu 3 mL by Nebulization route every six (6) hours as needed for Wheezing. 120 Nebule 5   ???  budesonide (Pulmicort) 0.5 mg/2 mL nbsp 2 mL by Nebulization route two (2) times a day. 60 Each 11   ??? albuterol (PROVENTIL HFA, VENTOLIN HFA, PROAIR HFA) 90 mcg/actuation inhaler Take 2 Puffs by inhalation every six (6) hours as needed for Wheezing. 1 Each  11   ??? traZODone (DESYREL) 50 mg tablet Take 1 Tablet by mouth nightly. 90 Tablet 3   ??? sertraline (Zoloft) 20 mg/mL concentrated solution Take 2.5 mL by mouth daily. Indications: anxiousness associated with depression 75 mL 0   ??? gabapentin (NEURONTIN) 100 mg capsule Take 2 Capsules by mouth three (3) times daily as needed for Pain (Restless leg). 360 Capsule 3   ??? oxyCODONE IR (ROXICODONE) 10 mg tab immediate release tablet Take 1 Tablet by mouth every six (6) hours as needed for Pain for up to 30 days. Max Daily Amount: 40 mg. 120 Tablet 0   ??? ibuprofen (MOTRIN) 800 mg tablet Take 1 tablet by mouth twice daily as needed for pain 60 Tablet 0   ??? LORazepam (ATIVAN) 0.5 mg tablet Take 1 Tablet by mouth daily. Max Daily Amount: 0.5 mg. 30 Tablet 0   ??? artificial saliva (MOUTH KOTE) spra Take 1 Spray by mouth as needed (dry mouth). 59 mL 1   ??? diphenoxylate-atropine (LomotiL) 2.5-0.025 mg per tablet Take 1 Tablet by mouth four (4) times daily as needed for Diarrhea. Max Daily Amount: 4 Tablets. 60 Tablet 5   ??? multivitamin (ONE A DAY) tablet Take 1 Tablet by mouth daily.     ??? cholecalciferol (Vitamin D3) 25 mcg (1,000 unit) cap Take  by mouth daily.         OBJECTIVE:  Vitals 03/21/2021   Blood Pressure 109/50   Pulse 58   Temp 97.5   Resp 16   Height    Weight 93 lb 3.2 oz   SpO2 97   BSA 1.38 m2   BMI 16 kg/m2   BP comment        Physical Exam:  Constitutional: Well developed, thin, frail, cachectic male in no acute distress. Ex-wife present.   HEENT: Normocephalic and atraumatic. Speech difficult to understand due to oral surgery.  Pupils are equal, round, and reactive to light. Extraocular muscles are intact.  Sclerae anicteric. Neck supple without JVD.   Lymph node   deferred   Skin Warm and dry.  No bruising and no rash noted.  No erythema.  No pallor.   Respiratory unlabored respiratory effort.    CVS deferred   Abdomen Soft, nontender and nondistended   Neuro Grossly nonfocal with no obvious sensory or motor  deficits.   MSK Normal range of motion in general.  No edema and no tenderness.   Psych Appropriate mood and affect.        Labs:  No results found for this or any previous visit (from the past 24 hour(s)).    Imaging:  No results found for this or any previous visit.    ASSESSMENT:    ICD-10-CM ICD-9-CM    1. 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 Diarrhea, unspecified type  R19.7 787.91    4. Tongue cancer (HCC)  C02.9 141.9    5. Encounter for palliative care  Z51.5 V66.7      Problem List  Date Reviewed: 02/19/21          Codes Class Noted    Dehydration ICD-10-CM: E86.0  ICD-9-CM:  276.51  01/17/2021        Tongue cancer ([place]) ICD-10-CM: C02.9  ICD-9-CM: 141.9  06/27/2020        History of tobacco abuse ICD-10-CM: Z87.891  ICD-9-CM: V15.82  09/28/2019    Overview Signed 06/27/2020  6:01 PM by Rayann Heman, MD     Last Assessment & Plan:   Formatting of this note might be different from the original.  After assessing the patient's willingness to quit, the patient was counseled for 4 minutes on tobacco cessation including the impact of smoking on health, the benefits of quitting, and various options available for quitting including medications, behavioral modifications, and support. The patient has not chosen a quit date.    He meets criteria for lung cancer screening. Will plan for shared decision making and ordering of LDCT for 09/2020 at an upcoming visit.             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  ECHO 06/08/2019:  Interpretation Summary     ?? The left ventricular systolic function is normal (55-65%).  ?? Grade I (mild) left ventricular diastolic dysfunction present, consistent with impaired relaxation.  ?? The right  ventricular systolic function is normal.  ?? 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 Mild-to-moderate mitral valve regurgitation. Consider TEE for further evaluation.  ?? Myxomatous tricuspid valve with mild regurgitation.  ?? Normal chamber sizes.  ?? There is moderate pulmonary hypertension.     Mild to moderate MR. Will need ongoing surveillance, no intervention at this time.             Centrilobular emphysema ([place]) ICD-10-CM: J43.2  ICD-9-CM: 492.8  06/09/2019    Overview Signed 06/27/2020  6:01 PM by Rayann Heman, MD     Last Assessment & Plan:   Formatting of this note might be different from the original.  Very severe obstruction in a current smoker. Smoking cessation strongly encouraged. Will discontinue his Dulera and start Trelegy. Continue PRN SABA. 48mwt on return visit.             Unstable angina (HCC) ICD-10-CM: I20.0  ICD-9-CM: 411.1  06/08/2019    Overview Signed 06/27/2020  6:01 PM by Rayann Heman, MD     Formatting of this note might be different from the original.  Added automatically from request for surgery [phone]             Emphysema lung [facility]) ICD-10-CM: J43.9  ICD-9-CM: 492.8  Unknown                PLAN:  Lab studies and imaging studies were personally reviewed.    Pertinent old records were reviewed from [facility].     1. Pain: Continue ibuprofen BID.  Continue oxycodone 10mg  every 4 prn as per PCP.  No Rx as last prescribed by PCP.  If PCP opts to stop managing we can discuss further.     2. Anxiety: improved    3. Depression: Continue Zoloft 50mg  daily.  He states his PCP is managing zoloft.    4. Diarrhea: Continue Lomotil prn.      Advanced Care Planning Discussed: None today.    Will  follow up in: as needed if symptoms recur.    I have reviewed the patient's controlled substance prescription history, as maintained in the [place] prescription monitoring program, so that the prescriptions(s) for a controlled substance can be given.  Last Date Reviewed:  03/21/21          Fanny Bien, NP  Outpatient Palliative Care at the  [facility]  [address] [place] [address]  Office : [phone]  Fax : [phone]

## 2021-03-21 NOTE — Progress Notes (Signed)
Patient: Caleb Robinson MRN: [id number]  SSN: [id number]    Date of Birth: June 27, 1951  Age: 70 y.o.  Sex: male      Other Providers:  Dr. Benard Rink    DIAGNOSIS: [id number] floor of mouth SCCa    PREVIOUS TREATMENT:  1. 09/28/20: tumor resection and floor of mouth reconstruction  2. 02/08/2021: adjuvant RT, 6000 cGy in 30 fractions    INTERVAL HISTORY:  Caleb Robinson is a 70 y.o. male is here for f/u. He had a CT which showed NED. He was recently seen by Dr. Benard Rink who recommended more work to repair the exposed plate. He notes some improvement in his weight gain. Improvement in pain. He continues to smoke.    UPDATED PAST MEDICAL HISTORY:  Since our prior encounter, Blakeley Margraf has not been hospitalized.  There have been no significant changes to the medical history.     MEDICATIONS:     Current Outpatient Medications:   ???  albuterol-ipratropium (DUO-NEB) 2.5 mg-0.5 mg/3 ml nebu, 3 mL by Nebulization route every six (6) hours as needed for Wheezing., Disp: 120 Nebule, Rfl: 5  ???  budesonide (Pulmicort) 0.5 mg/2 mL nbsp, 2 mL by Nebulization route two (2) times a day., Disp: 60 Each, Rfl: 11  ???  albuterol (PROVENTIL HFA, VENTOLIN HFA, PROAIR HFA) 90 mcg/actuation inhaler, Take 2 Puffs by inhalation every six (6) hours as needed for Wheezing., Disp: 1 Each, Rfl: 11  ???  traZODone (DESYREL) 50 mg tablet, Take 1 Tablet by mouth nightly., Disp: 90 Tablet, Rfl: 3  ???  sertraline (Zoloft) 20 mg/mL concentrated solution, Take 2.5 mL by mouth daily. Indications: anxiousness associated with depression, Disp: 75 mL, Rfl: 0  ???  gabapentin (NEURONTIN) 100 mg capsule, Take 2 Capsules by mouth three (3) times daily as needed for Pain (Restless leg)., Disp: 360 Capsule, Rfl: 3  ???  oxyCODONE IR (ROXICODONE) 10 mg tab immediate release tablet, Take 1 Tablet by mouth every six (6) hours as needed for Pain for up to 30 days. Max Daily Amount: 40 mg., Disp: 120 Tablet, Rfl: 0  ???  ibuprofen (MOTRIN) 800 mg  tablet, Take 1 tablet by mouth twice daily as needed for pain, Disp: 60 Tablet, Rfl: 0  ???  LORazepam (ATIVAN) 0.5 mg tablet, Take 1 Tablet by mouth daily. Max Daily Amount: 0.5 mg., Disp: 30 Tablet, Rfl: 0  ???  artificial saliva (MOUTH KOTE) spra, Take 1 Spray by mouth as needed (dry mouth)., Disp: 59 mL, Rfl: 1  ???  diphenoxylate-atropine (LomotiL) 2.5-0.025 mg per tablet, Take 1 Tablet by mouth four (4) times daily as needed for Diarrhea. Max Daily Amount: 4 Tablets., Disp: 60 Tablet, Rfl: 5  ???  multivitamin (ONE A DAY) tablet, Take 1 Tablet by mouth daily., Disp: , Rfl:   ???  cholecalciferol (Vitamin D3) 25 mcg (1,000 unit) cap, Take  by mouth daily., Disp: , Rfl:     ALLERGIES:   Allergies   Allergen Reactions   ??? Cat Dander Swelling       PHYSICAL EXAMINATION:   ECOG Performance status 1  VITAL SIGNS:   Visit Vitals  BP (!) 109/50 (BP 1 Location: Left upper arm, BP Patient Position: Sitting)   Pulse (!) 58   Temp 97.5 ??F (36.4 ??C)   Resp 16   Wt 42.3 kg (93 lb 3.2 oz)   SpO2 97%   BMI 16.00 kg/m??      General: well developed/nourished adult Male in no  acute distress; appears stated age  61: normocephalic, atraumatic; EOMI; exposed hardware in [place] with associated crusting  Neck: supple with full ROM; no cervical lymphadenopathy  Respiratory: normal inspiratory effort, no audible wheezes  Extremities: no cyanosis, clubbing, or edema  Musculoskeletal: mobility intact x4; normal ROM in all joints  Skin: no skin lesions identified  Neuro: AOx3; sensation intact x 4; CNII-XII grossly intact  Psych: appropriate affect, insight, and judgement  GI: abdomen soft, non-distended    LABORATORY:   Lab Results   Component Value Date/Time    Sodium 141 02/14/2021 10:08 AM    Potassium 4.8 02/14/2021 10:08 AM    Chloride 109 (H) 02/14/2021 10:08 AM    CO2 30 02/14/2021 10:08 AM    Anion gap 2 (L) 02/14/2021 10:08 AM    Glucose 88 02/14/2021 10:08 AM    BUN 35 (H) 02/14/2021 10:08 AM    Creatinine 0.80 02/14/2021 10:08 AM    GFR  est AA >60 02/14/2021 10:08 AM    GFR est non-AA >60 02/14/2021 10:08 AM    Calcium 9.6 02/14/2021 10:08 AM    Magnesium 2.4 02/14/2021 10:08 AM    Albumin 3.4 02/14/2021 10:08 AM    Protein, total 7.2 02/14/2021 10:08 AM    Globulin 3.8 (H) 02/14/2021 10:08 AM    A-G Ratio 0.9 (L) 02/14/2021 10:08 AM    ALT (SGPT) 38 02/14/2021 10:08 AM     Lab Results   Component Value Date/Time    WBC 4.6 02/14/2021 10:08 AM    HGB 12.1 (L) 02/14/2021 10:08 AM    HCT 39.3 (L) 02/14/2021 10:08 AM    PLATELET 194 02/14/2021 10:08 AM       RADIOLOGY:  XR ABD (KUB)    Result Date: 01/25/2021  KUB INDICATION: PEG tube malfunction Plain films of the abdomen were obtained before and after injection of the PEG tube with contrast.  No fluoroscopy was utilized. FINDINGS: The bowel gas pattern is normal.  All of the injected contrast is in the stomach.  There is no contrast extravasation.     Tip of the PEG tube is in the stomach.    CT NECK SOFT TISSUE W CONT    Result Date: 03/19/2021  EXAM: CT NECK SOFT TISSUES WITH IV CONTRAST INDICATION: restaging H/N cancer. RT end 02-08-21.. COMPARISON: C-spine CT 05/03/2020 TECHNIQUE: Multiple axial images were obtained from the skull base through the thoracic inlet following administration of 75 mL Isovue 370 IV contrast. Radiation dose reduction techniques were used for this study. Our CT scanners use one or all of the following: Automated exposure control, adjustment of the mA and/or kV according to patient size, iterative reconstruction. FINDINGS: Orbits and paranasal sinuses: Patent. Osseous skull base: Normal. Lymph nodes: Normal. Pharynx: Extensive postoperative changes of the oropharynx, mandible, submandibular space, and bilateral neck. Heterogeneous appearance of the tongue with apparent asymmetric atrophy on the left relative hyperenhancement on the right. No residual soft tissue mass clearly demonstrated. Larynx: Normal. Salivary glands: Normal. Thyroid: Normal. Visualized thorax: Stable  scarring in the bilateral lung apices. The lungs are emphysematous. Vessels: Normal. Bones and soft tissues: No aggressive bone lesion.     No evidence of residual cancer or regional lymphadenopathy by CT.     CT CHEST WO CONT    Result Date: 01/17/2021  CT of the chest without contrast. CLINICAL INDICATION: Pulmonary nodules, followup examination. PROCEDURE: Serial thin section axial images are obtained from the thoracic inlet through the upper abdomen without the  administration of intravenous contrast. Radiation dose reduction techniques were used for this study. Our CT scanners use one or all of the following: Automated exposure control, adjusted of the mA and/or kV according to patient size, iterative reconstruction COMPARISON: Chest CT dated 07/25/2020 and 05/31/2020 FINDINGS: No mediastinal or axilla adenopathy. There is no pleural or pericardial effusion. The heart is normal in size. Atherosclerotic calcifications noted in the aorta. Scarring is noted at the right lung apex. There is diffuse emphysema. There is a stable 8 mm pulmonary nodule (image 90) in the anterior right upper lobe. The previous nodular densities along the posterior aspect of the right upper lobe has resolved. There is increased thickening along the minor fissure with atelectasis. No definite new pulmonary nodules evident. Limited evaluation of the upper abdomen is unremarkable. A PEG tube is in place. No aggressive osseous is identified.     1. Stable 8 mm pulmonary nodule in the anterior aspect of the right upper lobe. Recommend continued imaging surveillance with follow-up CT in six months. 2. The previous 7 mm pulmonary nodule in the more posterior right upper lobe has resolved. 3. Severe emphysema 4. No acute cardiopulmonary abnormality.    XR CHEST PORT    Result Date: 02/14/2021  CHEST X-RAY, 1 VIEW INDICATION: Meets sirs criteria COMPARISON: Chest CT 01/17/2021 TECHNIQUE: Single AP view of the chest was obtained. FINDINGS: Lungs: Stable  appearance of right infrahilar opacities. No consolidation. No frank pulmonary edema. The lungs are emphysematous. Cardiomediastinal silhouette: Normal. Pleura: No pneumothorax or pleural effusion. Osseous structures: Normal.     1.  No radiographic evidence of acute cardiopulmonary disease. 2.  Pulmonary emphysema. Stable right infrahilar opacities.       IMPRESSION:  Dinh Ayotte is a 70 y.o. 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    PLAN:    F/U in 6 weeks with PET. Speech pathology referral to help him with swallowing.  He continues f/u with Dr. Benard Rink.    Portions of this note were copied from prior encounters and reviewed for accuracy, currency, and represent documentation and tasks completed during this encounter.  I verify and attest these portions to be unchanged from prior visits.      Osa Craver, MD  03/22/21

## 2021-03-21 NOTE — Telephone Encounter (Signed)
H&N Navigation/Nutrition:  Pt remains NPO/TF dependent for estimated nutrition needs. Receiving 1 L NS every other day at home via PICC line - pt's BP is WNL, pt feeling well so we will continue this until next follow up in ~6 weeks. Spoke with Merrilee Seashore, PharmD at [facility] and plan to continue w/ current orders until follow up on (5/26).      [place], [place], [place]

## 2021-03-22 NOTE — Progress Notes (Signed)
 Ambulatory Care Management Note    Date/Time:  03/22/2021 1:16 PM    This Ambulatory Care Manager (ACM) reviewed and updated the following screenings during this call; general assessment    Patient's challenges to self-management identified:   medical condition      Medication Management:  good adherence and good understanding    Pt's wife denies swelling, pt to receive IVFs today.  C/o rt ear clogged can't hear, requests appt w/ PCP, RNCM scheduled appt for Mon 03/26/21 3p, pt notified.     Advance Care Planning:   Does patient have an Advance Directive:  no    Advertising copywriter, Referrals, and Durable Medical Equipment: na    Health Maintenance Due   Topic Date Due   . Hepatitis C Screening  Never done   . COVID-19 Vaccine (1) Never done   . Pneumococcal 65+ years (1 - PCV) Never done   . DTaP/Tdap/Td series (1 - Tdap) Never done   . Colorectal Cancer Screening Combo  Never done   . Shingrix Vaccine Age 34> (1 of 2) Never done   . Low dose CT lung screening  Never done   . AAA Screening 36-70 YO Male Smoking Patients  Never done     Health Maintenance Reviewed:      Patient was asked to consider health care goals that they would like to focus on with this ACM.   ACM will follow up with patient to discuss goals and establish care plan in the next 7-14 days.       PCP/Specialist follow up:   Future Appointments   Date Time Provider Department Center   05/03/2021  1:00 PM Jobe Cancer, MD [facility]   05/21/2021  8:40 AM Coutu, Morna RAMAN, NP [facility] PP   05/23/2021  9:45 AM Estell Duncans, MD [facility]   07/09/2021  4:45 PM SFD CT 64 SLICE UNIT 1 SFDRCT [facility]

## 2021-03-26 ENCOUNTER — Ambulatory Visit: Attending: Medical | Primary: Family Medicine

## 2021-03-26 ENCOUNTER — Ambulatory Visit: Admit: 2021-03-26 | Discharge: 2021-03-26 | Payer: MEDICARE | Attending: Medical | Primary: Family Medicine

## 2021-03-26 DIAGNOSIS — H938X3 Other specified disorders of ear, bilateral: Secondary | ICD-10-CM

## 2021-03-26 MED ORDER — FLUTICASONE 50 MCG/ACTUATION NASAL SPRAY, SUSP
50 mcg/actuation | Freq: Every day | NASAL | 1 refills | Status: AC
Start: 2021-03-26 — End: ?

## 2021-03-26 NOTE — Progress Notes (Signed)
[facility] [place] [address]  Phone [phone]      Patient: Caleb Robinson  Date of Birth: 08-03-1951  Age 70 y.o.  Sex male  MEDICAL RECORD NUMBER [id number]  Visit Date: 03/26/21  Author:  Sueanne Margarita, PA-C    Family Practice Clinic Note    Chief Complaint   Patient presents with   ??? Ear Fullness     right side        History of Present Illness  This is a 70 year old gentleman who presents today with complaints of a sense of fullness in both ears.  This is been ongoing now for the last couple weeks.  Right ear seems to be worse than the left.  He has not had any recent nasal congestion, rhinorrhea, sinus pressure or pain.  Denies any fever or chills.  Denies ear pain or discharge from the [facility].  Hearing is muffled as a result.  He states that he has a difficult time hearing his roommate in their home.  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  Following with ENT and oncology.    Past History:    Past Medical history   Past Medical History:   Diagnosis Date   ??? Chronic obstructive pulmonary disease ([place])     follows with Pulmonology.  daily and rescure inhaler. no home O2.   ??? Emphysema lung ([place])    ??? HTN (hypertension)    ??? Tongue cancer ([place])        Current Problem List:   Patient Active Problem List   Diagnosis Code   ??? Emphysema lung ([place]) J43.9   ??? Vision loss H54.7   ??? 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 History of tobacco abuse Z87.891   ??? 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 Unstable angina ([facility]) I20.0   ??? Centrilobular emphysema ([place]) J43.2   ??? Dehydration E86.0       Current Medications:   Current Outpatient Medications   Medication Sig Dispense   ??? chlorhexidine (PERIDEX) 0.12 % solution 10 mL 4 TIMES DAILY (route: mucous membrane)    ??? fluticasone-umeclidinium-vilanterol (Trelegy Ellipta) 100-62.5-25 mcg inhaler 2 inhalation DAILY (route: inhalation)    ??? sertraline (Zoloft) 20 mg/mL  concentrated solution 2.5 mL DAILY (route: oral)    ??? nutritional supplements (Nutren 1.5) 0.07 gram-1.5 kcal/mL liqd 250 mL EVERY 4 HOURS (route: feeding tube)    ??? albuterol-ipratropium (DUO-NEB) 2.5 mg-0.5 mg/3 ml nebu 3 mL by Nebulization route every six (6) hours as needed for Wheezing. 120 Nebule   ??? budesonide (Pulmicort) 0.5 mg/2 mL nbsp 2 mL by Nebulization route two (2) times a day. 60 Each   ??? albuterol (PROVENTIL HFA, VENTOLIN HFA, PROAIR HFA) 90 mcg/actuation inhaler Take 2 Puffs by inhalation every six (6) hours as needed for Wheezing. 1 Each   ??? traZODone (DESYREL) 50 mg tablet Take 1 Tablet by mouth nightly. 90 Tablet   ??? sertraline (Zoloft) 20 mg/mL concentrated solution Take 2.5 mL by mouth daily. Indications: anxiousness associated with depression 75 mL   ??? gabapentin (NEURONTIN) 100 mg capsule Take 2 Capsules by mouth three (3) times daily as needed for Pain (Restless leg). 360 Capsule   ??? oxyCODONE IR (ROXICODONE) 10 mg tab immediate release tablet Take 1 Tablet by mouth every six (6) hours as needed for Pain for up to 30 days. Max Daily Amount: 40  mg. 120 Tablet   ??? ibuprofen (MOTRIN) 800 mg tablet Take 1 tablet by mouth twice daily as needed for pain 60 Tablet   ??? LORazepam (ATIVAN) 0.5 mg tablet Take 1 Tablet by mouth daily. Max Daily Amount: 0.5 mg. 30 Tablet   ??? artificial saliva (MOUTH KOTE) spra Take 1 Spray by mouth as needed (dry mouth). 59 mL   ??? diphenoxylate-atropine (LomotiL) 2.5-0.025 mg per tablet Take 1 Tablet by mouth four (4) times daily as needed for Diarrhea. Max Daily Amount: 4 Tablets. 60 Tablet   ??? multivitamin (ONE A DAY) tablet Take 1 Tablet by mouth daily.    ??? cholecalciferol (Vitamin D3) 25 mcg (1,000 unit) cap Take  by mouth daily.      No current facility-administered medications for this visit.       Allergies:  Allergies   Allergen Reactions   ??? Cat Dander Swelling       Surgical History:  Past Surgical History:   Procedure Laterality Date   ??? HX HERNIA REPAIR   05/2019       Family History:  Family History   Problem Relation Age of Onset   ??? Dementia Mother        Social History:   Social History     Social History Narrative   ??? Not on file      Social History     Socioeconomic History   ??? Marital status: SINGLE     Spouse name: Not on file   ??? Number of children: Not on file   ??? Years of education: Not on file   ??? Highest education level: Not on file   Occupational History   ??? Occupation: [employer]   Tobacco Use   ??? Smoking status: Current Every Day Smoker     Packs/day: 0.75     Years: 45.00     Pack years: 33.75   ??? Smokeless tobacco: Never Used   ??? Tobacco comment: stopped but restarted Dec 2019   Vaping Use   ??? Vaping Use: Never used   Substance and Sexual Activity   ??? Alcohol use: Yes     Alcohol/week: 1.0 standard drink     Types: 1 Cans of beer per week     Comment: occ   ??? Drug use: Never   ??? Sexual activity: Not Currently     Birth control/protection: None     Comment: widow   Other Topics Concern   ??? Not on file   Social History Narrative   ??? Not on file     Social Determinants of Health     Financial Resource Strain:    ??? Difficulty of Paying Living Expenses: Not on file   Food Insecurity:    ??? Worried About Running Out of Food in the Last Year: Not on file   ??? Ran Out of Food in the Last Year: Not on file   Transportation Needs:    ??? Lack of Transportation (Medical): Not on file   ??? Lack of Transportation (Non-Medical): Not on file   Physical Activity:    ??? Days of Exercise per Week: Not on file   ??? Minutes of Exercise per Session: Not on file   Stress:    ??? Feeling of Stress : Not on file   Social Connections:    ??? Frequency of Communication with Friends and Family: Not on file   ??? Frequency of Social Gatherings with Friends and Family: Not on file   ???  Attends Religious Services: Not on file   ??? Active Member of Clubs or Organizations: Not on file   ??? Attends Archivist Meetings: Not on file   ??? Marital Status: Not on file   Intimate Partner  Violence:    ??? Fear of Current or Ex-Partner: Not on file   ??? Emotionally Abused: Not on file   ??? Physically Abused: Not on file   ??? Sexually Abused: Not on file   Housing Stability:    ??? Unable to Pay for Housing in the Last Year: Not on file   ??? Number of Places Lived in the Last Year: Not on file   ??? Unstable Housing in the Last Year: Not on file         ROS  Review of Systems   Constitutional: Negative for chills and fever.   HENT: Positive for hearing loss (Muffled/decreased hearing, right ear). Negative for congestion, drooling, ear discharge, ear pain, rhinorrhea, sinus pressure and sinus pain.    Respiratory: Negative for cough.          Visit Vitals  BP (!) 100/58 (BP 1 Location: Left arm, BP Patient Position: Sitting, BP Cuff Size: Adult)   Pulse 82   Temp 98.1 ??F (36.7 ??C) (Temporal)   Resp 16   Ht 5\' 4"  (1.626 m)   Wt 94 lb 4.8 oz (42.8 kg)   SpO2 95%   BMI 16.19 kg/m??     Body mass index is 16.19 kg/m??.     Physical Exam    Physical Exam  Vitals and nursing note reviewed.   Constitutional:       General: He is not in acute distress.     Appearance: He is not ill-appearing.   HENT:      Right Ear: Tympanic membrane, ear canal and external ear normal.      Left Ear: Tympanic membrane, ear canal and external ear normal.      Ears:      Comments: Small amount of cerumen noted in the left EAC.  Right is clear.     Nose: Nose normal.   Eyes:      Conjunctiva/sclera: Conjunctivae normal.   Cardiovascular:      Rate and Rhythm: Normal rate and regular rhythm.      Heart sounds: Normal heart sounds.   Pulmonary:      Effort: Pulmonary effort is normal.      Breath sounds: Normal breath sounds.   Musculoskeletal:      Cervical back: Neck supple.   Neurological:      Mental Status: He is alert.   Psychiatric:         Mood and Affect: Mood normal.         Behavior: Behavior normal.         Thought Content: Thought content normal.         ASSESSMENT & PLAN  Encounter Diagnoses     ICD-10-CM ICD-9-CM   1. Ear  fullness, bilateral  H93.8X3 388.8       1. Ear fullness, bilateral  *Exam today is unrevealing.  Symptoms suggestive of eustachian tube dysfunction.  *Trial of Flonase, 2 sprays per nostril once daily.  *If he continues to have persistent symptoms, would have him see ENT for further evaluation.  - fluticasone propionate (FLONASE) 50 mcg/actuation nasal spray; 2 Sprays by Both Nostrils route daily.  Dispense: 1 Each; Refill: 1      Orders Placed This Encounter     ???  fluticasone propionate (FLONASE) 50 mcg/actuation nasal spray     Sig: 2 Sprays by Both Nostrils route daily.     Dispense:  1 Each     Refill:  1       I have reviewed the patient's past medical history, social history and family history and vitals.  We have discussed treatment plan and follow up and given patient instructions.  Patient's questions are answered and we will follow up as indicated.    Dictated using voice recognition software.  Proof read but unrecognized errors may exist.    Follow-up and Dispositions    ?? Return as previously scheduled, 05/2021, with Dr.Ryberg.         Sueanne Margarita, PA-C

## 2021-04-04 ENCOUNTER — Encounter

## 2021-04-04 MED ORDER — SERTRALINE 20 MG/ML ORAL CONCENTRATE
20 mg/mL | Freq: Every day | ORAL | 0 refills | Status: AC
Start: 2021-04-04 — End: ?

## 2021-04-04 NOTE — Telephone Encounter (Signed)
sertraline (Zoloft) 20 mg/mL concentrated solution [[id number]]    Refill needed to be sent in

## 2021-04-04 NOTE — Telephone Encounter (Signed)
LDOS-03/26/21-David

## 2021-04-13 ENCOUNTER — Encounter

## 2021-04-13 ENCOUNTER — Inpatient Hospital Stay
Admit: 2021-04-13 | Discharge: 2021-04-16 | Disposition: A | Discharge status: Home / Self Care | Payer: MEDICARE | Attending: Internal Medicine | Admitting: Internal Medicine

## 2021-04-13 ENCOUNTER — Emergency Department: Admit: 2021-04-13 | Payer: MEDICARE | Primary: Family Medicine

## 2021-04-13 DIAGNOSIS — J690 Pneumonitis due to inhalation of food and vomit: Secondary | ICD-10-CM

## 2021-04-13 LAB — LACTIC ACID
Lactic Acid: 1.3 MMOL/L (ref 0.4–2.0)
Lactic acid: 1.3 MMOL/L (ref 0.4–2.0)

## 2021-04-13 LAB — CBC WITH AUTO DIFFERENTIAL
Basophils %: 0 % (ref 0.0–2.0)
Basophils Absolute: 0 10*3/uL (ref 0.0–0.2)
Eosinophils %: 0 % — ABNORMAL LOW (ref 0.5–7.8)
Eosinophils Absolute: 0 10*3/uL (ref 0.0–0.8)
Granulocyte Absolute Count: 0 10*3/uL (ref 0.0–0.5)
Hematocrit: 39 % — ABNORMAL LOW (ref 41.1–50.3)
Hemoglobin: 12.4 g/dL — ABNORMAL LOW (ref 13.6–17.2)
Immature Granulocytes: 0 % (ref 0.0–5.0)
Lymphocytes %: 5 % — ABNORMAL LOW (ref 13–44)
Lymphocytes Absolute: 0.5 10*3/uL (ref 0.5–4.6)
MCH: 31.9 PG (ref 26.1–32.9)
MCHC: 31.8 g/dL (ref 31.4–35.0)
MCV: 100.3 FL — ABNORMAL HIGH (ref 79.6–97.8)
MPV: 11.7 FL (ref 9.4–12.3)
Monocytes %: 9 % (ref 4.0–12.0)
Monocytes Absolute: 0.8 10*3/uL (ref 0.1–1.3)
NRBC Absolute: 0 10*3/uL (ref 0.0–0.2)
Neutrophils %: 85 % — ABNORMAL HIGH (ref 43–78)
Neutrophils Absolute: 7.6 10*3/uL (ref 1.7–8.2)
Platelets: 225 10*3/uL (ref 150–450)
RBC: 3.89 M/uL — ABNORMAL LOW (ref 4.23–5.6)
RDW: 16.8 % — ABNORMAL HIGH (ref 11.9–14.6)
WBC: 8.9 10*3/uL (ref 4.3–11.1)

## 2021-04-13 LAB — PROCALCITONIN
Procalcitonin: 0.67 ng/mL — ABNORMAL HIGH (ref 0.00–0.49)
Procalcitonin: 0.67 ng/mL — ABNORMAL HIGH (ref 0.00–0.49)

## 2021-04-13 LAB — RAPID INFLUENZA A/B ANTIGENS
Influenza A Ag: NEGATIVE
Influenza B Ag: NEGATIVE

## 2021-04-13 LAB — COMPREHENSIVE METABOLIC PANEL
ALT: 22 U/L (ref 12–65)
AST: 16 U/L (ref 15–37)
Albumin/Globulin Ratio: 0.9 — ABNORMAL LOW (ref 1.2–3.5)
Albumin: 3.6 g/dL (ref 3.2–4.6)
Alkaline Phosphatase: 96 U/L (ref 50–136)
Anion Gap: 7 mmol/L (ref 7–16)
BUN: 29 MG/DL — ABNORMAL HIGH (ref 8–23)
CO2: 29 mmol/L (ref 21–32)
Calcium: 9.6 MG/DL (ref 8.3–10.4)
Chloride: 105 mmol/L (ref 98–107)
Creatinine: 0.9 MG/DL (ref 0.8–1.5)
EGFR IF NonAfrican American: >60 mL/min/{1.73_m2} (ref >60)
GFR African American: >60 mL/min/{1.73_m2} (ref >60)
Globulin: 3.8 g/dL — ABNORMAL HIGH (ref 2.3–3.5)
Glucose: 109 mg/dL — ABNORMAL HIGH (ref 65–100)
Potassium: 4.1 mmol/L (ref 3.5–5.1)
Sodium: 141 mmol/L (ref 138–145)
Total Bilirubin: 0.4 MG/DL (ref 0.2–1.1)
Total Protein: 7.4 g/dL (ref 6.3–8.2)

## 2021-04-13 LAB — COVID-19, RAPID: SARS-CoV-2, Rapid: NOT DETECTED

## 2021-04-13 LAB — METABOLIC PANEL, COMPREHENSIVE
A-G Ratio: 0.9 — ABNORMAL LOW (ref 1.2–3.5)
ALT (SGPT): 22 U/L (ref 12–65)
AST (SGOT): 16 U/L (ref 15–37)
Albumin: 3.6 g/dL (ref 3.2–4.6)
Alk. phosphatase: 96 U/L (ref 50–136)
Anion gap: 7 mmol/L (ref 7–16)
BUN: 29 MG/DL — ABNORMAL HIGH (ref 8–23)
Bilirubin, total: 0.4 MG/DL (ref 0.2–1.1)
CO2: 29 mmol/L (ref 21–32)
Calcium: 9.6 MG/DL (ref 8.3–10.4)
Chloride: 105 mmol/L (ref 98–107)
Creatinine: 0.9 MG/DL (ref 0.8–1.5)
GFR est AA: >60 mL/min/{1.73_m2} (ref >60)
GFR est non-AA: >60 mL/min/{1.73_m2} (ref >60)
Globulin: 3.8 g/dL — ABNORMAL HIGH (ref 2.3–3.5)
Glucose: 109 mg/dL — ABNORMAL HIGH (ref 65–100)
Potassium: 4.1 mmol/L (ref 3.5–5.1)
Protein, total: 7.4 g/dL (ref 6.3–8.2)
Sodium: 141 mmol/L (ref 138–145)

## 2021-04-13 LAB — CBC WITH AUTOMATED DIFF
ABS. BASOPHILS: 0 10*3/uL (ref 0.0–0.2)
ABS. EOSINOPHILS: 0 10*3/uL (ref 0.0–0.8)
ABS. IMM. GRANS.: 0 10*3/uL (ref 0.0–0.5)
ABS. LYMPHOCYTES: 0.5 10*3/uL (ref 0.5–4.6)
ABS. MONOCYTES: 0.8 10*3/uL (ref 0.1–1.3)
ABS. NEUTROPHILS: 7.6 10*3/uL (ref 1.7–8.2)
ABSOLUTE NRBC: 0 10*3/uL (ref 0.0–0.2)
BASOPHILS: 0 % (ref 0.0–2.0)
EOSINOPHILS: 0 % — ABNORMAL LOW (ref 0.5–7.8)
HCT: 39 % — ABNORMAL LOW (ref 41.1–50.3)
HGB: 12.4 g/dL — ABNORMAL LOW (ref 13.6–17.2)
IMMATURE GRANULOCYTES: 0 % (ref 0.0–5.0)
LYMPHOCYTES: 5 % — ABNORMAL LOW (ref 13–44)
MCH: 31.9 PG (ref 26.1–32.9)
MCHC: 31.8 g/dL (ref 31.4–35.0)
MCV: 100.3 FL — ABNORMAL HIGH (ref 79.6–97.8)
MONOCYTES: 9 % (ref 4.0–12.0)
MPV: 11.7 FL (ref 9.4–12.3)
NEUTROPHILS: 85 % — ABNORMAL HIGH (ref 43–78)
PLATELET: 225 10*3/uL (ref 150–450)
RBC: 3.89 M/uL — ABNORMAL LOW (ref 4.23–5.6)
RDW: 16.8 % — ABNORMAL HIGH (ref 11.9–14.6)
WBC: 8.9 10*3/uL (ref 4.3–11.1)

## 2021-04-13 LAB — COVID-19 RAPID TEST: COVID-19 rapid test: NOT DETECTED

## 2021-04-13 LAB — INFLUENZA A & B AG (RAPID TEST)
Influenza A Ag: NEGATIVE
Influenza B Ag: NEGATIVE

## 2021-04-13 MED ORDER — OXYCODONE 10 MG TAB
10 mg | ORAL_TABLET | Freq: Four times a day (QID) | ORAL | 0 refills | Status: AC | PRN
Start: 2021-04-13 — End: 2021-05-13

## 2021-04-13 MED ORDER — SODIUM CHLORIDE 0.9 % IJ SYRG
INTRAMUSCULAR | Status: DC | PRN
Start: 2021-04-13 — End: 2021-04-16

## 2021-04-13 MED ORDER — SODIUM CHLORIDE 0.9 % IJ SYRG
Freq: Three times a day (TID) | INTRAMUSCULAR | Status: DC
Start: 2021-04-13 — End: 2021-04-16
  Administered 2021-04-14 – 2021-04-16 (×9): via INTRAVENOUS

## 2021-04-13 MED ORDER — SODIUM CHLORIDE 0.9% BOLUS IV
0.9 % | Freq: Once | INTRAVENOUS | Status: AC
Start: 2021-04-13 — End: 2021-04-13
  Administered 2021-04-13: 22:00:00 via INTRAVENOUS

## 2021-04-13 MED ORDER — METHYLPREDNISOLONE (PF) 125 MG/2 ML IJ SOLR
125 mg/2 mL | Freq: Once | INTRAMUSCULAR | Status: AC
Start: 2021-04-13 — End: 2021-04-13
  Administered 2021-04-13: 22:00:00 via INTRAVENOUS

## 2021-04-13 MED ORDER — IPRATROPIUM-ALBUTEROL 2.5 MG-0.5 MG/3 ML NEB SOLUTION
2.5 mg-0.5 mg/3 ml | RESPIRATORY_TRACT | Status: AC
Start: 2021-04-13 — End: 2021-04-13
  Administered 2021-04-13: 23:00:00 via RESPIRATORY_TRACT

## 2021-04-13 MED ORDER — LACTATED RINGERS BOLUS IV
Freq: Once | INTRAVENOUS | Status: AC
Start: 2021-04-13 — End: 2021-04-13
  Administered 2021-04-14: via INTRAVENOUS

## 2021-04-13 MED FILL — IPRATROPIUM-ALBUTEROL 2.5 MG-0.5 MG/3 ML NEB SOLUTION: 2.5 mg-0.5 mg/3 ml | RESPIRATORY_TRACT | Qty: 3

## 2021-04-13 MED FILL — SOLU-MEDROL (PF) 125 MG/2 ML SOLUTION FOR INJECTION: 125 mg/2 mL | INTRAMUSCULAR | Qty: 2

## 2021-04-13 NOTE — ED Notes (Signed)
Pt arrived via POV c/o decreased appetite X2 months, vomiting and fever X2 days. Pt has a PICC line and peg tube. Per the daughter, pt has been on the decline X3 months. Noted wet cough in triage. RA sat at 85%. Pt placed on 3L NC. Daughter states that he hash had PNA X2 months

## 2021-04-13 NOTE — Progress Notes (Signed)
 TRANSFER - IN REPORT:    Verbal report received from Toribio (name) on Zacharius Funari  being received from ED (unit) for routine progression of care      Report consisted of patient's Situation, Background, Assessment and   Recommendations(SBAR).     Information from the following report(s) SBAR, Kardex, ED Summary, Procedure Summary, Intake/Output, MAR, Accordion, Recent Results and Cardiac Rhythm SNR was reviewed with the receiving nurse.    Opportunity for questions and clarification was provided.      Assessment completed upon patient's arrival to unit and care assumed.

## 2021-04-13 NOTE — ACP (Advance Care Planning) (Signed)
ACP (Advance Care Planning) by Tamela Oddi, DO at 04/13/21 2228                Author: Tamela Oddi, DO  Service: Internal Medicine  Author Type: Physician       Filed: 04/13/21 2337  Date of Service: 04/13/21 2228  Status: Signed          Editor: Tamela Oddi, DO (Physician)                         [facility]   At the heart of better care          Advance Care Planning     Admit Date:  04/13/2021  5:25 PM    Name:  Chaitanya Amedee    Age:  70 y.o.   Sex:  male   DOB:  [birth year]    MRN:  [id number]    Room:  ER29/29      Smokey Melott is able to make his  own decisions: Yes      If pt unable to make decisions, POA/surrogate decision maker:   Verna Czech (ex-spouse)         Patient / surrogate decision-maker directed:   Code Status: DO NOT RESUSCITATE, DO NOT INTUBATE -okay for other aggressive medical and surgical intervention      "No, just let me go in peace."      Patient or surrogate consented to discussion of the current conditions, workup, management plans, prognosis, and understand the risk for further deterioration.      Time spent: 17 minutes in direct discussion (face to face and/or over phone).      Signed:   Tamela Oddi, DO

## 2021-04-13 NOTE — ED Notes (Signed)
 TRANSFER - OUT REPORT:    Verbal report given to Tinh on Caleb Robinson  being transferred to 5th Floor Room 519 for routine progression of care       Report consisted of patient's Situation, Background, Assessment and   Recommendations(SBAR).     Information from the following report(s) SBAR, ED Summary and MAR was reviewed with the receiving nurse.    Lines:   PICC Single Lumen 02/14/21 Right;Brachial (Active)       Peripheral IV 04/13/21 Left Antecubital (Active)   Site Assessment Clean, dry, & intact 04/13/21 1724   Phlebitis Assessment 0 04/13/21 1724   Infiltration Assessment 0 04/13/21 1724   Dressing Status Clean, dry, & intact 04/13/21 1724        Opportunity for questions and clarification was provided.      Patient transported with:   [employer]

## 2021-04-13 NOTE — ED Notes (Signed)
Pts oxygen level dropped to 85 once taken off oxygen. Dr. Margaretmary Eddy notified of oxygen saturation drop. Pt placed back on 3L nasal cannula, current oxygen saturation 96%.

## 2021-04-13 NOTE — Progress Notes (Signed)
I have reviewed the patient's controlled substance prescription history, as maintained in the [place] prescription monitoring program, so that the prescriptions(s) for a controlled substance can be given.  Last Date Reviewed: 04/13/21

## 2021-04-13 NOTE — H&P (Signed)
H&P by Tamela Oddi, DO at 04/13/21  2228                Author: Tamela Oddi, DO  Service: Internal Medicine  Author Type: Physician       Filed: 04/14/21 0003  Date of Service: 04/13/21 2228  Status: Signed          Editor: Tamela Oddi, DO (Physician)                          Hospitalist History and Physical     Admit Date:  04/13/2021  5:25 PM    Name:  Caleb Robinson    Age:  70 y.o.   Sex:  male   DOB:  08/02/1951    MRN:  [id number]    Room:  ER29/29     Presenting Complaint: Shortness of Breath      Reason(s) for Admission: Acute respiratory failure with hypoxia ([place]) [J96.01]         History of Present Illness:     Caleb Robinson is a 70 y.o.  male with medical history of tongue cancer recently completed radiation, severe COPD, tobacco use who presented with fever, cough, SOB associated  with nausea and vomiting of 1 day duration prompting him to come to the ER.  He reported ongoing tobacco use but denies using oxygen supplementation at home.  Reported compliance to his home inhalers.  He follows [facility] oncology and recently completed  radiation for his tongue cancer.  Stated that feeling well until past day.  Denies chest pain, abdominal pain, diarrhea, constipation.      At baseline, he only receives liquid in syringe by mouth but all medications and nutritions through his PEG.  He has a PICC line and gets a liter of IVF every other day to maintain hydration.      In ED, O2 sat 85% on RA and was placed on 3L O2 NC.  VS otherwise stable.  CBC in CMP largely unremarkable.  Rapid COVID-19 negative.  Influenza negative.  Pro-Cal 0.69. CXR shows right perihilar opacity similar to previous imaging that could be  scarring; underlying malignancy is considered less likely but not excluded.  Given DuoNeb, 2L boluses, and Solu-Medrol in ED.         Review of Systems:   10 systems reviewed and negative except as noted in HPI.     Assessment & Plan:        Acute respiratory failure with hypoxia    Multifactorial in setting of pneumonia and COPD exacerbation.  PE in DDx due to history of oral cancer. D-Dimer 1.27. Does not wear oxygen at baseline.  In ED, O2 sat 85% on RA and was placed on 3 O2 NC.   Treatment for pneumonia and COPD per below   Check CT chest to rule out PE   On 3 LO 2 NC, wean as tolerated      Bacterial pneumonia   CXR shows right perihilar opacity similar to previous imaging that could be scarring; underlying malignancy is considered less likely but not excluded.  ProCal elevated 0.69. Rapid Covid-19 and influenza negative.    ABX: Vanc/Cefepime/Azithromcyin (5/6--) d/t immunocompromised state   BCX: pending   Antitussives as needed   Obtain Sputum Cx, MRSA Cx      Acute COPD exacerbation   Does not require O2 at baseline. B/l expiratory wheezing on admission. Meets GOLD criteria.   Cont Azithromycin  for 5 days   Solumedrol '40mg'$  IV TID   Duo-nebs scheduled   Cont home Pulmicort   Cont Albuterol prn   Resume Trelegy on discharge      Oral cancer   [place] [facility] oncology outpatient.  Completed treatment recently > a month ago.  Receives 1L NS bolus every other day outpatient through PICC line.  N.p.o. and receives nutrition through PEG. Follows Palliative care outpatient.   N.p.o., can have liquids via syringe bolus as tolerated   Consult nutrition for TF   Hold off on home intermittent boluses and start mIVF at 3m/h   [facility]      Tobacco use   Cessation discussed   Nicotine patch      HLD   Continue Crestor      Anxiety/depression   Continue home Zoloft, trazodone, Ativan as needed        Dispo/Discharge Planning:    >2 midnights.  PT/OT/PPD      Diet: No diet orders on file   VTE ppx: Lovenox  Code status: Prior         Hospital Problems  as of 04/13/2021  Date Reviewed:  04/13/2021                         Codes  Class  Noted - Resolved  POA              Acute respiratory failure with hypoxia ([facility]  ICD-10-CM: J96.01    ICD-9-CM: 518.81    04/13/2021 - Present  Unknown                          Past History:     Past Medical History:        Diagnosis  Date         ?  Chronic obstructive pulmonary disease ([place]            follows with Pulmonology.  daily and rescure inhaler. no home O2.         ?  Emphysema lung ([place]       ?  HTN (hypertension)           ?  Tongue cancer ([facility]            Past Surgical History:         Procedure  Laterality  Date          ?  HX HERNIA REPAIR    05/2019           Allergies        Allergen  Reactions         ?  Cat Dander  Swelling           Social History          Tobacco Use         ?  Smoking status:  Current Every Day Smoker              Packs/day:  0.75         Years:  45.00         Pack years:  33.75         ?  Smokeless tobacco:  Never Used        ?  Tobacco comment: stopped but restarted Dec 2019       Substance Use Topics         ?  Alcohol use:  Yes              Alcohol/week:  1.0 standard drink         Types:  1 Cans of beer per week             Comment: occ           Family History         Problem  Relation  Age of Onset          ?  Dementia  Mother           Family history reviewed and negative except as noted above.        Immunization History        Administered  Date(s) Administered         ?  Influenza Vaccine  12/23/2018        Prior to Admit Medications:     Current Outpatient Medications        Medication  Instructions         ?  albuterol (PROVENTIL HFA, VENTOLIN HFA, PROAIR HFA) 90 mcg/actuation inhaler  2 Puffs, Inhalation, EVERY 6 HOURS AS NEEDED     ?  albuterol-ipratropium (DUO-NEB) 2.5 mg-0.5 mg/3 ml nebu  3 mL, Nebulization, EVERY 6 HOURS AS NEEDED     ?  artificial saliva (MOUTH KOTE) spra  1 Spray, Oral, AS NEEDED     ?  budesonide (PULMICORT)  500 mcg, Nebulization, 2 TIMES DAILY     ?  chlorhexidine (PERIDEX) 0.12 % solution  10 mL 4 TIMES DAILY (route: mucous membrane)     ?  cholecalciferol (Vitamin D3) 25 mcg (1,000 unit) cap  Oral, DAILY     ?  diphenoxylate-atropine  (LomotiL) 2.5-0.025 mg per tablet  1 Tablet, Oral, 4 TIMES DAILY AS NEEDED     ?  fluticasone propionate (FLONASE) 50 mcg/actuation nasal spray  2 Sprays, Both Nostrils, DAILY     ?  fluticasone-umeclidinium-vilanterol (Trelegy Ellipta) 100-62.5-25 mcg inhaler  2 inhalation DAILY (route: inhalation)     ?  gabapentin (NEURONTIN)  200 mg, Oral, 3 TIMES DAILY AS NEEDED         ?  ibuprofen (MOTRIN) 800 mg tablet  Take 1 tablet by mouth twice daily as needed for pain         ?  LORazepam (ATIVAN)  0.5 mg, Oral, DAILY     ?  multivitamin (ONE A DAY) tablet  1 Tablet, Oral, DAILY     ?  nutritional supplements (Nutren 1.5) 0.07 gram-1.5 kcal/mL liqd  250 mL EVERY 4 HOURS (route: feeding tube)     ?  oxyCODONE IR (ROXICODONE)  10 mg, Oral, EVERY 6 HOURS AS NEEDED     ?  rosuvastatin (CRESTOR) 10 mg tablet  No dose, route, or frequency recorded.     ?  sertraline (ZOLOFT)  50 mg, Oral, DAILY         ?  traZODone (DESYREL)  50 mg, Oral, EVERY BEDTIME             Objective:        Patient Vitals for the past 24 hrs:            Temp  Pulse  Resp  BP  SpO2            04/13/21 2158  --  71  21  (!) 101/55  97 %  04/13/21 2128  --  78  21  (!) 108/56  99 %     04/13/21 2108  --  80  16  123/69  98 %     04/13/21 2048  --  83  20  (!) 111/53  96 %     04/13/21 2028  --  77  22  (!) 108/49  97 %     04/13/21 2018  --  76  22  107/61  97 %     04/13/21 2016  --  80  18  107/61  97 %     04/13/21 1958  --  81  22  (!) 107/52  98 %     04/13/21 1948  --  82  19  (!) 129/59  98 %     04/13/21 1928  --  82  18  (!) 112/46  100 %     04/13/21 1918  --  85  24  (!) 109/48  99 %     04/13/21 1900  --  84  17  111/61  99 %     04/13/21 1843  --  84  18  102/64  98 %     04/13/21 1842  --  --  --  --  97 %     04/13/21 1830  --  84  17  (!) 116/53  (!) 85 %     04/13/21 1817  --  85  23  (!) 113/57  97 %     04/13/21 1802  --  85  18  (!) 118/58  97 %     04/13/21 1716  --  --  --  --  90 %            04/13/21 1714  97.8 ??F (36.6  ??C)  (!) 104  20  (!) 111/54  (!) 85 %        Oxygen Therapy   O2 Sat (%): 97 % (04/13/21 2158)   Pulse via Oximetry: 72 beats per minute (04/13/21 2158)   O2 Device: None (04/13/21 2016)   O2 Flow Rate (L/min): 3 l/min (04/13/21 1843)   FIO2 (%): 32 % (04/13/21 1842)      Estimated body mass index is 15.45 kg/m?? as calculated from the following:     Height as of this encounter: '5\' 4"'$  (1.626 m).     Weight as of this encounter: 40.8 kg (90 lb).   No intake or output data in the 24 hours ending 04/13/21 2228        Physical Exam:   Blood pressure (!) 101/55, pulse 71, temperature 97.8 ??F (36.6 ??C), resp.  rate 21, height '5\' 4"'$  (1.626 m), weight 40.8 kg (90 lb), SpO2 97 %.  General:    Frail-appearing.  No overt distress.   Head:  Normocephalic, atraumatic   Eyes:  Sclerae appear normal.  Pupils equally round.   ENT:  Nares appear normal, no drainage.  Dry oral mucosa.    Neck:  No restricted ROM.  Trachea midline    CV:   RRR.  No m/r/g.  No jugular venous distension.   Lungs:   Rhonchi on bilateral lower bases with expiratory wheezing noted.  Respirations even, unlabored   Abdomen:   Bowel sounds present.  Soft, nontender, nondistended.  PEG in place.   Extremities: No cyanosis or clubbing.  No edema   Skin:     No rashes and normal coloration.  Warm and dry.     Neuro:  CN II-XII grossly intact.  Sensation intact.  A&Ox3   Psych:  Normal mood and affect.        I have reviewed ordered lab tests and independently visualized imaging below:      Last 24hr Labs:     Recent Results (from the past 24 hour(s))     LACTIC ACID          Collection Time: 04/13/21  5:25 PM         Result  Value  Ref Range            Lactic acid  1.3  0.4 - 2.0 MMOL/L       CBC WITH AUTOMATED DIFF          Collection Time: 04/13/21  5:25 PM         Result  Value  Ref Range            WBC  8.9  4.3 - 11.1 K/uL       RBC  3.89 (L)  4.23 - 5.6 M/uL       HGB  12.4 (L)  13.6 - 17.2 g/dL       HCT  39.0 (L)  41.1 - 50.3 %       MCV  100.3 (H)   79.6 - 97.8 FL       MCH  31.9  26.1 - 32.9 PG       MCHC  31.8  31.4 - 35.0 g/dL       RDW  16.8 (H)  11.9 - 14.6 %       PLATELET  225  150 - 450 K/uL       MPV  11.7  9.4 - 12.3 FL       ABSOLUTE NRBC  0.00  0.0 - 0.2 K/uL       DF  AUTOMATED          NEUTROPHILS  85 (H)  43 - 78 %       LYMPHOCYTES  5 (L)  13 - 44 %       MONOCYTES  9  4.0 - 12.0 %       EOSINOPHILS  0 (L)  0.5 - 7.8 %       BASOPHILS  0  0.0 - 2.0 %       IMMATURE GRANULOCYTES  0  0.0 - 5.0 %       ABS. NEUTROPHILS  7.6  1.7 - 8.2 K/UL       ABS. LYMPHOCYTES  0.5  0.5 - 4.6 K/UL       ABS. MONOCYTES  0.8  0.1 - 1.3 K/UL       ABS. EOSINOPHILS  0.0  0.0 - 0.8 K/UL       ABS. BASOPHILS  0.0  0.0 - 0.2 K/UL       ABS. IMM. GRANS.  0.0  0.0 - 0.5 K/UL       METABOLIC PANEL, COMPREHENSIVE          Collection Time: 04/13/21  5:25 PM         Result  Value  Ref Range            Sodium  141  138 - 145 mmol/L       Potassium  4.1  3.5 - 5.1 mmol/L       Chloride  105  98 - 107 mmol/L  CO2  29  21 - 32 mmol/L       Anion gap  7  7 - 16 mmol/L       Glucose  109 (H)  65 - 100 mg/dL       BUN  29 (H)  8 - 23 MG/DL       Creatinine  0.90  0.8 - 1.5 MG/DL       GFR est AA  >60  >60 ml/min/1.76m       GFR est non-AA  >60  >60 ml/min/1.712m      Calcium  9.6  8.3 - 10.4 MG/DL       Bilirubin, total  0.4  0.2 - 1.1 MG/DL       ALT (SGPT)  22  12 - 65 U/L       AST (SGOT)  16  15 - 37 U/L       Alk. phosphatase  96  50 - 136 U/L       Protein, total  7.4  6.3 - 8.2 g/dL       Albumin  3.6  3.2 - 4.6 g/dL       Globulin  3.8 (H)  2.3 - 3.5 g/dL       A-G Ratio  0.9 (L)  1.2 - 3.5         PROCALCITONIN          Collection Time: 04/13/21  5:25 PM         Result  Value  Ref Range            Procalcitonin  0.67 (H)  0.00 - 0.49 ng/mL       EKG, 12 LEAD, INITIAL          Collection Time: 04/13/21  5:40 PM         Result  Value  Ref Range            Ventricular Rate  89  BPM       Atrial Rate  83  BPM       P-R Interval  129  ms       QRS Duration  96  ms        Q-T Interval  378  ms       QTC Calculation (Bezet)  460  ms       Calculated P Axis  66  degrees       Calculated R Axis  -126  degrees       Calculated T Axis  58  degrees       Diagnosis                 Sinus rhythm   Atrial premature complexes   Probable right ventricular hypertrophy          COVID-19 RAPID TEST          Collection Time: 04/13/21  6:26 PM         Result  Value  Ref Range            Specimen source  Nasopharyngeal          COVID-19 rapid test  Not detected  NOTD         INFLUENZA A & B AG (RAPID TEST)          Collection Time: 04/13/21  6:44 PM         Result  Value  Ref Range  Influenza A Ag  Negative  NEG         Influenza B Ag  Negative  NEG         Source  Nasopharyngeal          LACTIC ACID          Collection Time: 04/13/21  7:47 PM         Result  Value  Ref Range            Lactic acid  1.6  0.4 - 2.0 MMOL/L             All Micro Results               Procedure  Component  Value  Units  Date/Time           BLOOD CULTURE [[id number]]  Collected: 04/13/21 1947            Order Status: Completed  Specimen: Blood  Updated: 04/13/21 2005           INFLUENZA A & B AG (RAPID TEST) [[id number]]  Collected: 04/13/21 1844            Order Status: Completed  Specimen: Nasal washing  Updated: 04/13/21 1904                Influenza A Ag  Negative                  Comment:  NEGATIVE FOR THE PRESENCE OF INFLUENZA A ANTIGEN   INFECTION DUE TO INFLUENZA A CANNOT BE RULED OUT.   BECAUSE THE ANTIGEN PRESENT IN THE SAMPLE MAY BE BELOW   THE DETECTION LIMIT OF THE TEST.   A NEGATIVE TEST IS PRESUMPTIVE AND IT IS RECOMMENDED THAT THESE RESULTS BE CONFIRMED BY VIRAL CULTURE OR AN FDA-CLEARED INFLUENZA A AND B MOLECULAR ASSAY.                             Influenza B Ag  Negative                  Comment:  NEGATIVE FOR THE PRESENCE OF INFLUENZA B ANTIGEN   INFECTION DUE TO INFLUENZA B CANNOT BE RULED OUT.   BECAUSE THE ANTIGEN PRESENT IN THE SAMPLE MAY BE BELOW   THE DETECTION LIMIT OF THE TEST.   A  NEGATIVE TEST IS PRESUMPTIVE AND IT IS RECOMMENDED THAT THESE RESULTS BE CONFIRMED BY VIRAL CULTURE OR AN FDA-CLEARED INFLUENZA A AND B MOLECULAR ASSAY.                             Source  Nasopharyngeal                COVID-19 RAPID TEST [[id number]]  Collected: 04/13/21 1826            Order Status: Completed  Specimen: Nasopharyngeal  Updated: 04/13/21 1859                Specimen source  Nasopharyngeal              COVID-19 rapid test  Not detected                  Comment:        The specimen is NEGATIVE for SARS-CoV-2, the novel coronavirus associated with COVID-19.   A negative result does not rule out COVID-19.  This test has been authorized by the FDA under an Emergency Use Authorization (EUA) for use by authorized laboratories.          Fact sheet for Healthcare Providers: LittleDVDs.dk   Fact sheet for Patients: SatelliteRebate.it         Methodology: Isothermal Nucleic Acid Amplification                        INFLUENZA A & B AG (RAPID TEST) [[id number]]  Collected: 04/13/21 1826            Order Status: Canceled  Specimen: Nasopharyngeal             BLOOD CULTURE [[id number]]  Collected: 04/13/21 1725            Order Status: Completed  Specimen: Blood  Updated: 04/13/21 1743                Other Studies:   XR CHEST PORT      Result Date: 04/13/2021   PORTABLE CHEST 1 VIEW HISTORY: Vomiting and fever for 2 days. Loss of appetite. COPD. Head and neck cancer. COMPARISON: February 14, 2021 FINDINGS: Emphysematous changes are present. An opacity in the right perihilar lung is similar in appearance to the previous  study and may be scarring. There is no new consolidation. A right-sided PICC line terminates in the SVC.       1. COPD. 2. Right perihilar opacity which is similar in appearance to previous imaging and may be scarring from prior inflammation. Underlying malignancy is considered less likely but is not excluded. Attention to this region is recommended  at the time  of follow-up CT imaging.           Medications Administered           albuterol-ipratropium (DUO-NEB) 2.5 MG-0.5 MG/3 ML         Admin Date   04/13/2021  Action   Given  Dose   3 mL  Route   Nebulization  Administered By   Iona Beard, RT                         lactated ringers bolus infusion 1,000 mL         Admin Date   04/13/2021  Action   New Bag  Dose   1,000 mL  Rate   1,000 mL/hr  Route   IntraVENous  Administered By   Marc Morgans, RN                         methylPREDNISolone (PF) (Solu-MEDROL) injection 125 mg         Admin Date   04/13/2021  Action   Given  Dose   125 mg  Route   IntraVENous  Administered By   [employer], RN                         sodium chloride 0.9 % bolus infusion 1,000 mL         Admin Date   04/13/2021  Action   New Bag  Dose   1,000 mL  Rate   1,000 mL/hr  Route   IntraVENous  Administered By   Moody Bruins, RN  Signed:   Tamela Oddi, DO     Part of this note may have been written by using a voice dictation software.  The note has been proof read but may still contain some grammatical/other typographical errors.

## 2021-04-13 NOTE — ED Provider Notes (Signed)
Patient is a 70 year old male presenting to the emergency department today secondary to nausea and vomiting.  Patient has history of oral cancer with delayed healing secondary to continued tobacco use.  The patient notes that he has a PICC line and gets a liter of IV fluids every other day and all of his nutrition is through his PEG tube.  The patient did not feel like he wanted any of it today so he did not take it.  The patient states that he had a fever last night and has had some increased shortness of breath and coughing.  He is also complaining of fatigue and headache.  Patient did not get his influenza or COVID vaccines.           Past Medical History:   Diagnosis Date   ??? Chronic obstructive pulmonary disease ([place])     follows with Pulmonology.  daily and rescure inhaler. no home O2.   ??? Emphysema lung ([place])    ??? HTN (hypertension)    ??? Tongue cancer [facility])        Past Surgical History:   Procedure Laterality Date   ??? HX HERNIA REPAIR  05/2019         Family History:   Problem Relation Age of Onset   ??? Dementia Mother        Social History     Socioeconomic History   ??? Marital status: SINGLE     Spouse name: Not on file   ??? Number of children: Not on file   ??? Years of education: Not on file   ??? Highest education level: Not on file   Occupational History   ??? Occupation: [employer]   Tobacco Use   ??? Smoking status: Current Every Day Smoker     Packs/day: 0.75     Years: 45.00     Pack years: 33.75   ??? Smokeless tobacco: Never Used   ??? Tobacco comment: stopped but restarted Dec 2019   Vaping Use   ??? Vaping Use: Never used   Substance and Sexual Activity   ??? Alcohol use: Yes     Alcohol/week: 1.0 standard drink     Types: 1 Cans of beer per week     Comment: occ   ??? Drug use: Never   ??? Sexual activity: Not Currently     Birth control/protection: None     Comment: widow   Other Topics Concern   ??? Not on file   Social History Narrative   ??? Not on file     Social Determinants of Health     Financial Resource Strain:     ??? Difficulty of Paying Living Expenses: Not on file   Food Insecurity:    ??? Worried About Running Out of Food in the Last Year: Not on file   ??? Ran Out of Food in the Last Year: Not on file   Transportation Needs:    ??? Lack of Transportation (Medical): Not on file   ??? Lack of Transportation (Non-Medical): Not on file   Physical Activity:    ??? Days of Exercise per Week: Not on file   ??? Minutes of Exercise per Session: Not on file   Stress:    ??? Feeling of Stress : Not on file   Social Connections:    ??? Frequency of Communication with Friends and Family: Not on file   ??? Frequency of Social Gatherings with Friends and Family: Not on file   ???  Attends Religious Services: Not on file   ??? Active Member of Clubs or Organizations: Not on file   ??? Attends Archivist Meetings: Not on file   ??? Marital Status: Not on file   Intimate Partner Violence:    ??? Fear of Current or Ex-Partner: Not on file   ??? Emotionally Abused: Not on file   ??? Physically Abused: Not on file   ??? Sexually Abused: Not on file   Housing Stability:    ??? Unable to Pay for Housing in the Last Year: Not on file   ??? Number of Places Lived in the Last Year: Not on file   ??? Unstable Housing in the Last Year: Not on file         ALLERGIES: Cat dander    Review of Systems   Constitutional: Positive for activity change, appetite change, chills, fatigue and fever.   Respiratory: Positive for cough, shortness of breath and wheezing.    Gastrointestinal: Positive for nausea and vomiting.   All other systems reviewed and are negative.      Vitals:    04/13/21 1716 04/13/21 1802 04/13/21 1817 04/13/21 1830   BP:  (!) 118/58 (!) 113/57 (!) 116/53   Pulse:  85 85 84   Resp:  18 23 17    Temp:       SpO2: 90% 97% 97% (!) 85%   Weight:       Height:                Physical Exam     MDM  Number of Diagnoses or Management Options  Acute respiratory failure with hypoxia (HCC)  COPD exacerbation (HCC)  Diagnosis management comments: acute exacerbation asthma, COPD,  CHF, COVID-19    Bronchitis, aspiration pneumonia, pneumonia,    PE, rib fracture, rib dysfunction, pleurisy, pneumothorax, aspiration of foreign body           Amount and/or Complexity of Data Reviewed  Clinical lab tests: ordered and reviewed  Tests in the radiology section of CPT??: ordered and reviewed  Tests in the medicine section of CPT??: ordered and reviewed  Review and summarize past medical records: yes  Independent visualization of images, tracings, or specimens: yes      ED Course as of 04/13/21 1856   Fri Apr 13, 2021   1803 XR CHEST PORT  IMPRESSION  ??  1. COPD.  ??  2. Right perihilar opacity which is similar in appearance to previous imaging  and may be scarring from prior inflammation. Underlying malignancy is considered  less likely but is not excluded. Attention to this region is recommended at the  time of follow-up CT imaging. [KH]   1803 O2 Sat (%)(!): 85 % [KH]   1855 I talked with the patient and the family about the findings in the emergency department.  The patient is agreeable to being admitted for his hypoxic respiratory failure COPD.  Admission pending COVID and influenza testing results. [KH]      ED Course User Index  [KH] Keene Breath, DO       EKG    Date/Time: 04/13/2021 6:04 PM  Performed by: Keene Breath, DO  Authorized by: Keene Breath, DO     ECG reviewed by ED Physician in the absence of a cardiologist: yes    Comments:      Normal sinus rhythm rate 87 bpm.  Narrow QRS complex with no ST elevations present

## 2021-04-13 NOTE — ED Notes (Signed)
Report given to Quillian Quince and [facility] RN, transfer of care at this time.

## 2021-04-14 ENCOUNTER — Encounter

## 2021-04-14 ENCOUNTER — Inpatient Hospital Stay: Admit: 2021-04-14 | Payer: MEDICARE | Primary: Family Medicine

## 2021-04-14 ENCOUNTER — Inpatient Hospital Stay: Payer: MEDICARE | Primary: Family Medicine

## 2021-04-14 LAB — CBC
Hematocrit: 31.7 % — ABNORMAL LOW (ref 41.1–50.3)
Hemoglobin: 10.1 g/dL — ABNORMAL LOW (ref 13.6–17.2)
MCH: 32.2 PG (ref 26.1–32.9)
MCHC: 31.9 g/dL (ref 31.4–35.0)
MCV: 101 FL — ABNORMAL HIGH (ref 79.6–97.8)
MPV: 12 FL (ref 9.4–12.3)
NRBC Absolute: 0 10*3/uL (ref 0.0–0.2)
Platelets: 174 10*3/uL (ref 150–450)
RBC: 3.14 M/uL — ABNORMAL LOW (ref 4.23–5.6)
RDW: 16.8 % — ABNORMAL HIGH (ref 11.9–14.6)
WBC: 9.3 10*3/uL (ref 4.3–11.1)

## 2021-04-14 LAB — BASIC METABOLIC PANEL
Anion Gap: 4 mmol/L — ABNORMAL LOW (ref 7–16)
BUN: 29 MG/DL — ABNORMAL HIGH (ref 8–23)
CO2: 28 mmol/L (ref 21–32)
Calcium: 8.4 MG/DL (ref 8.3–10.4)
Chloride: 109 mmol/L — ABNORMAL HIGH (ref 98–107)
Creatinine: 0.8 MG/DL (ref 0.8–1.5)
EGFR IF NonAfrican American: >60 mL/min/{1.73_m2} (ref >60)
GFR African American: >60 mL/min/{1.73_m2} (ref >60)
Glucose: 156 mg/dL — ABNORMAL HIGH (ref 65–100)
Potassium: 4.2 mmol/L (ref 3.5–5.1)
Sodium: 141 mmol/L (ref 138–145)

## 2021-04-14 LAB — URINE MICROSCOPIC
Casts UA: 0 /lpf
Casts: 0 /lpf
Crystals, UA: 0 /LPF
Crystals, urine: 0 /LPF

## 2021-04-14 LAB — EKG 12-LEAD
Atrial Rate: 83 {beats}/min
P Axis: 66 degrees
P-R Interval: 129 ms
Q-T Interval: 378 ms
QRS Duration: 96 ms
QTc Calculation (Bazett): 460 ms
R Axis: -126 degrees
T Axis: 58 degrees
Ventricular Rate: 89 {beats}/min

## 2021-04-14 LAB — MSSA/MRSA SC BY PCR, NASAL SWAB

## 2021-04-14 LAB — LACTIC ACID
Lactic Acid: 1.6 MMOL/L (ref 0.4–2.0)
Lactic acid: 1.6 MMOL/L (ref 0.4–2.0)

## 2021-04-14 LAB — D-DIMER, QUANTITATIVE: D-Dimer, Quant: 1.27 ug/ml(FEU) — ABNORMAL HIGH (ref <0.56)

## 2021-04-14 LAB — CBC W/O DIFF
ABSOLUTE NRBC: 0 10*3/uL (ref 0.0–0.2)
HCT: 31.7 % — ABNORMAL LOW (ref 41.1–50.3)
HGB: 10.1 g/dL — ABNORMAL LOW (ref 13.6–17.2)
MCH: 32.2 PG (ref 26.1–32.9)
MCHC: 31.9 g/dL (ref 31.4–35.0)
MCV: 101 FL — ABNORMAL HIGH (ref 79.6–97.8)
MPV: 12 FL (ref 9.4–12.3)
PLATELET: 174 10*3/uL (ref 150–450)
RBC: 3.14 M/uL — ABNORMAL LOW (ref 4.23–5.6)
RDW: 16.8 % — ABNORMAL HIGH (ref 11.9–14.6)
WBC: 9.3 10*3/uL (ref 4.3–11.1)

## 2021-04-14 LAB — EKG, 12 LEAD, INITIAL
Atrial Rate: 83 {beats}/min
Calculated P Axis: 66 degrees
Calculated R Axis: -126 degrees
Calculated T Axis: 58 degrees
P-R Interval: 129 ms
Q-T Interval: 378 ms
QRS Duration: 96 ms
QTC Calculation (Bezet): 460 ms
Ventricular Rate: 89 {beats}/min

## 2021-04-14 LAB — METABOLIC PANEL, BASIC
Anion gap: 4 mmol/L — ABNORMAL LOW (ref 7–16)
BUN: 29 MG/DL — ABNORMAL HIGH (ref 8–23)
CO2: 28 mmol/L (ref 21–32)
Calcium: 8.4 MG/DL (ref 8.3–10.4)
Chloride: 109 mmol/L — ABNORMAL HIGH (ref 98–107)
Creatinine: 0.8 MG/DL (ref 0.8–1.5)
GFR est AA: >60 mL/min/{1.73_m2} (ref >60)
GFR est non-AA: >60 mL/min/{1.73_m2} (ref >60)
Glucose: 156 mg/dL — ABNORMAL HIGH (ref 65–100)
Potassium: 4.2 mmol/L (ref 3.5–5.1)
Sodium: 141 mmol/L (ref 138–145)

## 2021-04-14 LAB — D DIMER: D DIMER: 1.27 ug/ml(FEU) — ABNORMAL HIGH (ref <0.56)

## 2021-04-14 MED ORDER — MORPHINE 2 MG/ML INJECTION
2 mg/mL | Freq: Once | INTRAMUSCULAR | Status: AC
Start: 2021-04-14 — End: 2021-04-14
  Administered 2021-04-14: 14:00:00 via INTRAVENOUS

## 2021-04-14 MED ORDER — ACETAMINOPHEN 325 MG TABLET
325 mg | Freq: Four times a day (QID) | ORAL | Status: DC | PRN
Start: 2021-04-14 — End: 2021-04-16

## 2021-04-14 MED ORDER — LORAZEPAM 0.5 MG TAB
0.5 mg | Freq: Every day | ORAL | Status: DC
Start: 2021-04-14 — End: 2021-04-13

## 2021-04-14 MED ORDER — NICOTINE 14 MG/24 HR DAILY PATCH
14 mg/24 hr | Freq: Every day | TRANSDERMAL | Status: DC | PRN
Start: 2021-04-14 — End: 2021-04-16

## 2021-04-14 MED ORDER — POLYETHYLENE GLYCOL 3350 17 GRAM (100 %) ORAL POWDER PACKET
17 gram | Freq: Every day | ORAL | Status: DC | PRN
Start: 2021-04-14 — End: 2021-04-16

## 2021-04-14 MED ORDER — DEXTROMETHORPHAN-GUAIFENESIN 10 MG-100 MG/5 ML SYRUP
100-10 mg/5 mL | Freq: Four times a day (QID) | ORAL | Status: DC | PRN
Start: 2021-04-14 — End: 2021-04-16
  Administered 2021-04-16: 04:00:00 via ORAL

## 2021-04-14 MED ORDER — OXYCODONE 5 MG TAB
5 mg | Freq: Four times a day (QID) | ORAL | Status: DC | PRN
Start: 2021-04-14 — End: 2021-04-14

## 2021-04-14 MED ORDER — SALINE PERIPHERAL FLUSH PRN
Freq: Once | INTRAMUSCULAR | Status: AC
Start: 2021-04-14 — End: 2021-04-14
  Administered 2021-04-14: 10:00:00

## 2021-04-14 MED ORDER — IPRATROPIUM-ALBUTEROL 2.5 MG-0.5 MG/3 ML NEB SOLUTION
2.5 mg-0.5 mg/3 ml | Freq: Four times a day (QID) | RESPIRATORY_TRACT | Status: DC
Start: 2021-04-14 — End: 2021-04-16
  Administered 2021-04-14 – 2021-04-16 (×10): via RESPIRATORY_TRACT

## 2021-04-14 MED ORDER — SERTRALINE 50 MG TAB
50 mg | Freq: Every day | ORAL | Status: DC
Start: 2021-04-14 — End: 2021-04-16

## 2021-04-14 MED ORDER — ALBUTEROL SULFATE 0.083 % (0.83 MG/ML) SOLN FOR INHALATION
2.5 mg /3 mL (0.083 %) | Freq: Four times a day (QID) | RESPIRATORY_TRACT | Status: DC | PRN
Start: 2021-04-14 — End: 2021-04-16

## 2021-04-14 MED ORDER — GABAPENTIN 100 MG CAP
100 mg | Freq: Three times a day (TID) | ORAL | Status: DC | PRN
Start: 2021-04-14 — End: 2021-04-16

## 2021-04-14 MED ORDER — ACETAMINOPHEN 650 MG RECTAL SUPPOSITORY
650 mg | Freq: Four times a day (QID) | RECTAL | Status: DC | PRN
Start: 2021-04-14 — End: 2021-04-16

## 2021-04-14 MED ORDER — SODIUM CHLORIDE 0.9 % IV PIGGY BACK
3 gram | Freq: Four times a day (QID) | INTRAVENOUS | Status: DC
Start: 2021-04-14 — End: 2021-04-16
  Administered 2021-04-14 – 2021-04-16 (×8): via INTRAVENOUS

## 2021-04-14 MED ORDER — TUBERCULIN PPD 5 UNIT/0.1 ML INTRADERMAL
5 tub. unit /0.1 mL | Freq: Once | INTRADERMAL | Status: AC
Start: 2021-04-14 — End: 2021-04-15

## 2021-04-14 MED ORDER — TRAZODONE 50 MG TAB
50 mg | Freq: Every evening | ORAL | Status: DC
Start: 2021-04-14 — End: 2021-04-13

## 2021-04-14 MED ORDER — SODIUM CHLORIDE 0.9 % IJ SYRG
Freq: Three times a day (TID) | INTRAMUSCULAR | Status: DC
Start: 2021-04-14 — End: 2021-04-16
  Administered 2021-04-14 – 2021-04-16 (×7): via INTRAVENOUS

## 2021-04-14 MED ORDER — SODIUM CHLORIDE 0.9 % IV
INTRAVENOUS | Status: DC
Start: 2021-04-14 — End: 2021-04-15
  Administered 2021-04-14: 04:00:00 via INTRAVENOUS

## 2021-04-14 MED ORDER — OXYCODONE 5 MG TAB
5 mg | ORAL | Status: DC | PRN
Start: 2021-04-14 — End: 2021-04-14

## 2021-04-14 MED ORDER — BUDESONIDE 0.5 MG/2 ML NEB SUSPENSION
0.5 mg/2 mL | Freq: Two times a day (BID) | RESPIRATORY_TRACT | Status: DC
Start: 2021-04-14 — End: 2021-04-14
  Administered 2021-04-14: 13:00:00 via RESPIRATORY_TRACT

## 2021-04-14 MED ORDER — ROSUVASTATIN 5 MG TAB
5 mg | Freq: Every evening | ORAL | Status: DC
Start: 2021-04-14 — End: 2021-04-16

## 2021-04-14 MED ORDER — HYDROCODONE-HOMATROPINE 5 MG-1.5 MG/5 ML (5 ML) ORAL SOLUTION
ORAL | Status: DC | PRN
Start: 2021-04-14 — End: 2021-04-16

## 2021-04-14 MED ORDER — METHYLPREDNISOLONE (PF) 40 MG/ML IJ SOLR
40 mg/mL | Freq: Two times a day (BID) | INTRAMUSCULAR | Status: DC
Start: 2021-04-14 — End: 2021-04-15
  Administered 2021-04-14 – 2021-04-15 (×3): via INTRAVENOUS

## 2021-04-14 MED ORDER — GUAIFENESIN 600 MG TABLET,EXTENDED RELEASE BIPHASIC 12 HR
600 mg | Freq: Two times a day (BID) | ORAL | Status: DC
Start: 2021-04-14 — End: 2021-04-13

## 2021-04-14 MED ORDER — DOXYCYCLINE HYCLATE 100 MG CAP
100 mg | Freq: Two times a day (BID) | ORAL | Status: DC
Start: 2021-04-14 — End: 2021-04-14

## 2021-04-14 MED ORDER — CEFEPIME 2 GRAM SOLUTION FOR INJECTION
2 gram | Freq: Once | INTRAMUSCULAR | Status: AC
Start: 2021-04-14 — End: 2021-04-14
  Administered 2021-04-14: 04:00:00 via INTRAVENOUS

## 2021-04-14 MED ORDER — VIAL2BAG ADAPTOR (20 MM)
1000 mg | Freq: Once | Status: AC
Start: 2021-04-14 — End: 2021-04-14
  Administered 2021-04-14: 05:00:00 via INTRAVENOUS

## 2021-04-14 MED ORDER — NUTRITIONAL PHARMACY SUPPORT ORDERS
Status: DC | PRN
Start: 2021-04-14 — End: 2021-04-16

## 2021-04-14 MED ORDER — ENOXAPARIN 30 MG/0.3 ML SUB-Q SYRINGE
30 mg/0.3 mL | Freq: Every day | SUBCUTANEOUS | Status: DC
Start: 2021-04-14 — End: 2021-04-16
  Administered 2021-04-14: 14:00:00 via SUBCUTANEOUS

## 2021-04-14 MED ORDER — IOPAMIDOL 76 % IV SOLN
76 % | Freq: Once | INTRAVENOUS | Status: AC
Start: 2021-04-14 — End: 2021-04-14
  Administered 2021-04-14: 10:00:00 via INTRAVENOUS

## 2021-04-14 MED ORDER — AZITHROMYCIN 250 MG TAB
250 mg | Freq: Every day | ORAL | Status: DC
Start: 2021-04-14 — End: 2021-04-14

## 2021-04-14 MED ORDER — OXYCODONE 5 MG/5 ML ORAL SOLN
5 mg/ mL | ORAL | Status: DC | PRN
Start: 2021-04-14 — End: 2021-04-16
  Administered 2021-04-14 – 2021-04-15 (×3): via ORAL

## 2021-04-14 MED ORDER — FAMOTIDINE 20 MG TAB
20 mg | Freq: Two times a day (BID) | ORAL | Status: DC
Start: 2021-04-14 — End: 2021-04-13

## 2021-04-14 MED ORDER — LORAZEPAM 0.5 MG TAB
0.5 mg | Freq: Every day | ORAL | Status: DC | PRN
Start: 2021-04-14 — End: 2021-04-16

## 2021-04-14 MED ORDER — SODIUM CHLORIDE 0.9% BOLUS IV
0.9 % | Freq: Once | INTRAVENOUS | Status: AC
Start: 2021-04-14 — End: 2021-04-14
  Administered 2021-04-14: 10:00:00 via INTRAVENOUS

## 2021-04-14 MED ORDER — AZITHROMYCIN 250 MG TAB
250 mg | Freq: Every day | ORAL | Status: DC
Start: 2021-04-14 — End: 2021-04-13

## 2021-04-14 MED ORDER — CHOLECALCIFEROL (VITAMIN D3) 1,000 UNIT (25 MCG) TAB
Freq: Every day | ORAL | Status: DC
Start: 2021-04-14 — End: 2021-04-13

## 2021-04-14 MED ORDER — ONDANSETRON (PF) 4 MG/2 ML INJECTION
4 mg/2 mL | Freq: Four times a day (QID) | INTRAMUSCULAR | Status: DC | PRN
Start: 2021-04-14 — End: 2021-04-16

## 2021-04-14 MED ORDER — SODIUM CHLORIDE 0.9 % IV PIGGY BACK
500 mg | Freq: Two times a day (BID) | INTRAVENOUS | Status: DC
Start: 2021-04-14 — End: 2021-04-14

## 2021-04-14 MED ORDER — TRAZODONE 50 MG TAB
50 mg | Freq: Every evening | ORAL | Status: DC
Start: 2021-04-14 — End: 2021-04-16

## 2021-04-14 MED ORDER — SODIUM CHLORIDE 0.9 % IJ SYRG
INTRAMUSCULAR | Status: DC | PRN
Start: 2021-04-14 — End: 2021-04-16

## 2021-04-14 MED ORDER — CHOLECALCIFEROL (VITAMIN D3) 1,000 UNIT (25 MCG) TAB
Freq: Every day | ORAL | Status: DC
Start: 2021-04-14 — End: 2021-04-16

## 2021-04-14 MED ORDER — CEFEPIME 2 GRAM SOLUTION FOR INJECTION
2 gram | Freq: Two times a day (BID) | INTRAMUSCULAR | Status: DC
Start: 2021-04-14 — End: 2021-04-14

## 2021-04-14 MED ORDER — FAMOTIDINE 20 MG TAB
20 mg | Freq: Every day | ORAL | Status: DC
Start: 2021-04-14 — End: 2021-04-16

## 2021-04-14 MED ORDER — ROSUVASTATIN 5 MG TAB
5 mg | Freq: Every evening | ORAL | Status: DC
Start: 2021-04-14 — End: 2021-04-13

## 2021-04-14 MED ORDER — FLUTICASONE 50 MCG/ACTUATION NASAL SPRAY, SUSP
50 mcg/actuation | Freq: Every day | NASAL | Status: DC
Start: 2021-04-14 — End: 2021-04-16
  Administered 2021-04-14 – 2021-04-15 (×2): via NASAL

## 2021-04-14 MED ORDER — GUAIFENESIN 100 MG/5 ML ORAL LIQUID
100 mg/5 mL | Freq: Two times a day (BID) | ORAL | Status: DC
Start: 2021-04-14 — End: 2021-04-16
  Administered 2021-04-14 – 2021-04-16 (×5): via GASTROSTOMY

## 2021-04-14 MED ORDER — DOXYCYCLINE HYCLATE 100 MG IV SOLUTION
100 mg | Freq: Two times a day (BID) | INTRAVENOUS | Status: DC
Start: 2021-04-14 — End: 2021-04-16
  Administered 2021-04-14 – 2021-04-16 (×5): via INTRAVENOUS

## 2021-04-14 MED ORDER — ONDANSETRON 4 MG TAB, RAPID DISSOLVE
4 mg | Freq: Three times a day (TID) | ORAL | Status: DC | PRN
Start: 2021-04-14 — End: 2021-04-16

## 2021-04-14 MED FILL — VANCOMYCIN 1,000 MG IV SOLR: 1000 mg | INTRAVENOUS | Qty: 1000

## 2021-04-14 MED FILL — ROSUVASTATIN 5 MG TAB: 5 mg | ORAL | Qty: 2

## 2021-04-14 MED FILL — DOXY-100 100 MG INTRAVENOUS SOLUTION: 100 mg | INTRAVENOUS | Qty: 100

## 2021-04-14 MED FILL — SOLU-MEDROL (PF) 40 MG/ML SOLUTION FOR INJECTION: 40 mg/mL | INTRAMUSCULAR | Qty: 1

## 2021-04-14 MED FILL — MORPHINE 2 MG/ML INJECTION: 2 mg/mL | INTRAMUSCULAR | Qty: 1

## 2021-04-14 MED FILL — GUAIFENESIN 100 MG/5 ML ORAL LIQUID: 100 mg/5 mL | ORAL | Qty: 20

## 2021-04-14 MED FILL — LOVENOX 30 MG/0.3 ML SUB-Q SYRINGE: 30 mg/0.3 mL | SUBCUTANEOUS | Qty: 0.3

## 2021-04-14 MED FILL — NUTRITIONAL PHARMACY SUPPORT ORDERS: Qty: 1

## 2021-04-14 MED FILL — OXYCODONE 5 MG/5 ML ORAL SOLN: 5 mg/ mL | ORAL | Qty: 10

## 2021-04-14 MED FILL — IPRATROPIUM-ALBUTEROL 2.5 MG-0.5 MG/3 ML NEB SOLUTION: 2.5 mg-0.5 mg/3 ml | RESPIRATORY_TRACT | Qty: 3

## 2021-04-14 MED FILL — FLUTICASONE 50 MCG/ACTUATION NASAL SPRAY, SUSP: 50 mcg/actuation | NASAL | Qty: 16

## 2021-04-14 MED FILL — AMPICILLIN-SULBACTAM 3 GRAM SOLUTION FOR INJECTION: 3 gram | INTRAMUSCULAR | Qty: 3

## 2021-04-14 MED FILL — BUDESONIDE 0.5 MG/2 ML NEB SUSPENSION: 0.5 mg/2 mL | RESPIRATORY_TRACT | Qty: 1

## 2021-04-14 MED FILL — TRAZODONE 50 MG TAB: 50 mg | ORAL | Qty: 1

## 2021-04-14 MED FILL — CEFEPIME 2 GRAM SOLUTION FOR INJECTION: 2 gram | INTRAMUSCULAR | Qty: 2

## 2021-04-14 MED FILL — SODIUM CHLORIDE 0.9 % IV: INTRAVENOUS | Qty: 1000

## 2021-04-14 NOTE — Progress Notes (Signed)
 ACUTE PHYSICAL THERAPY GOALS:  (Developed with and agreed upon by patient and/or caregiver.)  LTG:  (1.)Mr. Sedlacek will move from supine to sit and sit to supine , scoot up and down and roll side to side in bed with INDEPENDENT within 7 treatment day(s).    (2.)Mr. Lamadrid will transfer from bed to chair and chair to bed with INDEPENDENT using the least restrictive device within 7 treatment day(s).    (3.)Mr. Hellmer will ambulate with SUPERVISION for 200+ feet with the least restrictive device within 7 treatment day(s).  ________________________________________________________________________________________________        PHYSICAL THERAPY ASSESSMENT: Initial Assessment and AM PT Treatment Day # 1      Breken Nazari is a 70 y.o. male   PRIMARY DIAGNOSIS: Aspiration pneumonia (HCC)  Acute respiratory failure with hypoxia (HCC) [J96.01]       Reason for Referral:    ICD-10: Treatment Diagnosis: Generalized Muscle Weakness (M62.81)  Difficulty in walking, Not elsewhere classified (R26.2)  INPATIENT: Payor: AARP MEDICARE COMPLETE / Plan: [facility] AARP MEDICARE COMPLETE / Product Type: Managed Care Medicare /     ASSESSMENT:     REHAB RECOMMENDATIONS:   Recommendation to date pending progress:  Setting:  . No further skilled therapy after discharge from hospital  Equipment:   . To Be Determined     PRIOR LEVEL OF FUNCTION:  (Prior to Hospitalization) INITIAL/CURRENT LEVEL OF FUNCTION:  (Most Recently Demonstrated)   Bed Mobility:  . Independent  Sit to Stand:  . Independent  Transfers:  . Independent  Gait/Mobility:  . Independent Bed Mobility:  . Supervision  Sit to Stand:  . Standby Assistance  Transfers:  . Contact Guard Assistance  Gait/Mobility:  . Contact Guard Assistance     ASSESSMENT:  Mr. Canal was supine at arrival agreed to sit EOB after gathering data pt agreed to sit in chair, he moved around doing minor house keeping, grabbed phone and spun around to sit in chair. Made comfortable in  chair. Pt will benefit from skilled and sophisticated PT to help improve impairments.        SUBJECTIVE:   Mr. Snee states, trying to get rest    SOCIAL HISTORY/LIVING ENVIRONMENT: lives in apartment with roommate, no AD used, IND with all ADLs, does not drive, roommate drives to appt  Home Environment: Apartment  One/Two Story Residence: One story  Living Alone: No  Support Systems: Other Family Member(s),Friend/Neighbor  OBJECTIVE:     PAIN: VITAL SIGNS: LINES/DRAINS:   Pre Treatment: Pain Screen  Pain Scale 1: Numeric (0 - 10)  Pain Intensity 1: 0  Post Treatment: 0   IV and 4L  O2 Device: Nasal cannula     GROSS EVALUATION:   Within Functional Limits Abnormal/ Functional Abnormal/ Non-Functional (see comments) Not Tested Comments:   AROM [x]  []  []  []     PROM []  []  []  []     Strength [x]  []  []  []     Balance [x]  []  []  []     Posture [x]  []  []  []     Sensation []  []  []  []     Coordination []  []  []  []     Tone []  []  []  []     Edema []  []  []  []     Activity Tolerance []  [x]  []  []      []  []  []  []       COGNITION/  PERCEPTION: Intact Impaired   (see comments) Comments:   Orientation [x]  []     Vision [x]  []     Hearing [x]  []   Command Following [x]  []     Safety Awareness [x]  []      []  []       MOBILITY: I Mod I S SBA CGA Min Mod Max Total  NT x2 Comments:   Bed Mobility    Rolling []  []  []  [x]  []  []  []  []  []  []  []     Supine to Sit []  []  []  [x]  []  []  []  []  []  []  []     Scooting []  []  []  [x]  []  []  []  []  []  []  []     Sit to Supine []  []  []  []  []  []  []  []  []  [x]  []     Transfers    Sit to Stand []  []  []  [x]  []  []  []  []  []  []  []     Bed to Chair []  []  []  [x]  []  []  []  []  []  []  []     Stand to Sit []  []  []  [x]  []  []  []  []  []  []  []     I=Independent, Mod I=Modified Independent, S=Supervision, SBA=Standby Assistance, CGA=Contact Guard Assistance,   Min=Minimal Assistance, Mod=Moderate Assistance, Max=Maximal Assistance, Total=Total Assistance, NT=Not Tested  GAIT: I Mod I S SBA CGA Min Mod Max Total  NT x2 Comments:   Level of  Assistance []  []  []  []  [x]  []  []  []  []  []  []     Distance 3    DME None    Gait Quality good    Weightbearing Status N/A     I=Independent, Mod I=Modified Independent, S=Supervision, SBA=Standby Assistance, CGA=Contact Guard Assistance,   Min=Minimal Assistance, Mod=Moderate Assistance, Max=Maximal Assistance, Total=Total Assistance, NT=Not Tested    [employer] AM-PACT "6 Clicks"   Basic Mobility Inpatient Short Form       How much difficulty does the patient currently have... Unable A Lot A Little None   1.  Turning over in bed (including adjusting bedclothes, sheets and blankets)?   []  1   []  2   []  3   [x]  4   2.  Sitting down on and standing up from a chair with arms ( e.g., wheelchair, bedside commode, etc.)   []  1   []  2   []  3   [x]  4   3.  Moving from lying on back to sitting on the side of the bed?   []  1   []  2   []  3   [x]  4   How much help from another person does the patient currently need... Total A Lot A Little None   4.  Moving to and from a bed to a chair (including a wheelchair)?   []  1   []  2   []  3   [x]  4   5.  Need to walk in hospital room?   []  1   []  2   [x]  3   []  4   6.  Climbing 3-5 steps with a railing?   []  1   []  2   [x]  3   []  4    2007, Trustees of [address], under license to [employer], [place]. All rights reserved     Score:  Initial: 22 Most Recent: X (Date: -- )    Interpretation of Tool:  Represents activities that are increasingly more difficult (i.e. Bed mobility, Transfers, Gait).    PLAN:   FREQUENCY/DURATION: PT Plan of Care: 3 times/week for duration of hospital stay or until stated goals are met, whichever comes first.    PROBLEM LIST:   (Skilled intervention is medically necessary to address:)  1. Decreased ADL/Functional Activities  2. Decreased Activity Tolerance  3. Decreased AROM/PROM  4. Decreased Balance  5. Decreased Cognition  6. Decreased Coordination  7. Decreased Gait Ability  8. Decreased Strength  9. Decreased Transfer Abilities   INTERVENTIONS  PLANNED:   (Benefits and precautions of physical therapy have been discussed with the patient.)  1. Therapeutic Activity  2. Therapeutic Exercise/HEP  3. Neuromuscular Re-education  4. Gait Training  5. Education     TREATMENT:     EVALUATION: Moderate Complexity : (Untimed Charge)    TREATMENT:   ($$ Therapeutic Activity: 8-22 mins    )  Therapeutic Activity (8 Minutes): Therapeutic activity included Rolling, Supine to Sit, Scooting, Lateral Scooting, Transfer Training, Ambulation on level ground, Sitting balance  and Standing balance to improve functional Mobility, Strength and Activity tolerance.    TREATMENT GRID:  N/A    AFTER TREATMENT POSITION/PRECAUTIONS:  Chair, Needs within reach and RN notified    INTERDISCIPLINARY COLLABORATION:  RN/PCT and PT/PTA    TOTAL TREATMENT DURATION:  PT Patient Time In/Time Out  Time In: 1045  Time Out: 1110    Norleen ONEIDA Starling, DPT

## 2021-04-14 NOTE — Progress Notes (Signed)
Problem: Falls - Risk of  Goal: *Absence of Falls  Description: Document Caleb Robinson Fall Risk and appropriate interventions in the flowsheet.  04/14/2021 1622 by Rubye Oaks, RN  Outcome: Progressing Towards Goal  Note: Fall Risk Interventions:            Medication Interventions: Patient to call before getting OOB                04/14/2021 1622 by Rubye Oaks, RN  Outcome: Progressing Towards Goal  Note: Fall Risk Interventions:            Medication Interventions: Patient to call before getting OOB                   Problem: Patient Education: Go to Patient Education Activity  Goal: Patient/Family Education  Outcome: Progressing Towards Goal     Problem: Tissue Perfusion - Cardiopulmonary, Altered  Goal: *Optimize tissue perfusion  04/14/2021 1622 by Rubye Oaks, RN  Outcome: Progressing Towards Goal  04/14/2021 1622 by Rubye Oaks, RN  Outcome: Progressing Towards Goal  Goal: *Absence of hypoxia  04/14/2021 1622 by Rubye Oaks, RN  Outcome: Progressing Towards Goal  04/14/2021 1622 by Rubye Oaks, RN  Outcome: Progressing Towards Goal     Problem: Patient Education: Go to Patient Education Activity  Goal: Patient/Family Education  Outcome: Progressing Towards Goal     Problem: Risk for Spread of Infection  Goal: Prevent transmission of infectious organism to others  Description: Prevent the transmission of infectious organisms to other patients, staff members, and visitors.  04/14/2021 1622 by Rubye Oaks, RN  Outcome: Progressing Towards Goal  04/14/2021 1622 by Rubye Oaks, RN  Outcome: Progressing Towards Goal     Problem: Patient Education:  Go to Education Activity  Goal: Patient/Family Education  Outcome: Progressing Towards Goal     Problem: Pain  Goal: *Control of Pain  Outcome: Progressing Towards Goal  Goal: *PALLIATIVE CARE:  Alleviation of Pain  Outcome: Progressing Towards Goal

## 2021-04-14 NOTE — Progress Notes (Signed)
Problem: Falls - Risk of  Goal: *Absence of Falls  Description: Document [name] Fall Risk and appropriate interventions in the flowsheet.  Outcome: Progressing Towards Goal  Note: Fall Risk Interventions:            Medication Interventions: Assess postural VS orthostatic hypotension,Bed/chair exit alarm,Patient to call before getting OOB                   Problem: Patient Education: Go to Patient Education Activity  Goal: Patient/Family Education  Outcome: Progressing Towards Goal     Problem: Tissue Perfusion - Cardiopulmonary, Altered  Goal: *Optimize tissue perfusion  Outcome: Progressing Towards Goal  Goal: *Absence of hypoxia  Outcome: Progressing Towards Goal     Problem: Patient Education: Go to Patient Education Activity  Goal: Patient/Family Education  Outcome: Progressing Towards Goal     Problem: Risk for Spread of Infection  Goal: Prevent transmission of infectious organism to others  Description: Prevent the transmission of infectious organisms to other patients, staff members, and visitors.  Outcome: Progressing Towards Goal     Problem: Patient Education:  Go to Education Activity  Goal: Patient/Family Education  Outcome: Progressing Towards Goal     Problem: Pain  Goal: *Control of Pain  Outcome: Progressing Towards Goal  Goal: *PALLIATIVE CARE:  Alleviation of Pain  Outcome: Progressing Towards Goal

## 2021-04-14 NOTE — Consults (Signed)
 Comprehensive Nutrition Assessment    Type and Reason for Visit: Initial,Consult,Positive nutrition screen  Tube Feeding Management (Hospitalist)  Best Practice Alert for EN/PN PTA    Nutrition Recommendations/Plan:   Enteral Nutrition:   Enteral Access: PEG  Formula: Standard with Fiber (Jevity 1.5 Cal)  Delivery: Bolus feeding: Initiate Day 1 (5/7): Bolus times 5/day.   Water flush 85 ml before and after each bolus  Modulars: None not indicated at this time   Enteral regimen at goal to provide:  1775 calories (108% estimated calorie needs), 76 grams protein (100% estimated protein needs) and free fluid (~21ml/kcal).   IV Fluids:  Continue per MD order  Nutritional Supplement Therapy:   Implement electrolyte replacement per nutrition support protocols  Replacement indicated:  None  Labs:   EN labs: BMP daily, Mg tomorrow and MWF and Phos tomorrow and MWF.     POC Glucoses/SSI Not indicated  Meals and Snacks:  Continue NPO.     Malnutrition Assessment:  Malnutrition Status: Moderate malnutrition  Context: Chronic illness  Findings of clinical characteristics of malnutrition:   Energy Intake:  Unable to assess (pt reports varied TF intake at home)  Weight Loss:  20% over 1 year (20% wt loss; 105lbs 04/28/20)     Body Fat Loss:  Mild body fat loss, Orbital,Triceps,Fat overlying ribs   Muscle Mass Loss:  Severe muscle mass loss, Calf (gastrocnemius),Clavicles (pectoralis &deltoids),Hand (interosseous),Scapula (trapezius),Thigh (quadriceps),Temples (temporalis)  Fluid Accumulation:  No significant fluid accumulation,    Grip Strength:  Not performed     Nutrition Assessment:   Nutrition History:      Pt has been NPO and TF dependent since 12/2020. Pt states his intakes vary depending home how he feels that day. He reports using 1 bottle of Ensure (type depends on what the [employer] sends) to suspend his meds in in the morning and at night. He states he may use 2-7 cartons of Nutren 1.5 during the day in  between the Ensure boluses. He states if he feels good he'll have up to 7 cartons. Over the past few weeks he has been having 4 feedings a day. He also states he will take in 2 cartons at a time as well on occasion. Pt is unclear on what his water flush is before and after bolus feedings. He is aware the less TF he gives himself the less nutrition he takes in during the day. Pt states having N/V the day he arrived to the ER. Pt received PICC for IVF 3/10. He receives 1L IVF every other day. Pt states UBW of 110lbs prior to oral surgery. He has been ~86lbs over the past few months. Noted pt weighed 105lbs 04/28/20.  Nutrition Background:   Pt with PMH notable for COPD, HTN, tongue cancer s/p radiation, and PEG placement. He presents with N/V and SOB. He is admitted for acute respiratory failure d/t bacterial PNA and COPD exacerbation.  Nutrition Interval:  Pt seen sitting up in bed. Discussed starting TF today. Pt provides nutrition hx as stated above . Pt denies any N/V today. Discussed pt with Yuvonne, RN. Wound care consult in for wound on leg.     Abdominal Status (last documented): Soft,Tender,Ostomy (comment) (Mit key) abdomen with   bowel sounds. Last BM  PTA.  Pertinent Medications: D3, doxycycline , pepcid, solu-medrol, miralax  PRN, cefepime, vancomycin   IVF: NS @ 64ml/hr  Pertinent Labs:     Lab Results   Component Value Date/Time    NA 141  04/14/2021 03:12 AM    K 4.2 04/14/2021 03:12 AM    CL 109 (H) 04/14/2021 03:12 AM    CO2 28 04/14/2021 03:12 AM    AGAP 4 (L) 04/14/2021 03:12 AM    GLU 156 (H) 04/14/2021 03:12 AM    BUN 29 (H) 04/14/2021 03:12 AM    CREA 0.80 04/14/2021 03:12 AM    GFRAA >60 04/14/2021 03:12 AM    GFRNA >60 04/14/2021 03:12 AM    CA 8.4 04/14/2021 03:12 AM    ALB 3.6 04/13/2021 05:25 PM    TP 7.4 04/13/2021 05:25 PM    GLOB 3.8 (H) 04/13/2021 05:25 PM    AGRAT 0.9 (L) 04/13/2021 05:25 PM    ALT 22 04/13/2021 05:25 PM     Labs remarkable for sodium and K WNL.    Nutrition Related  Findings:   NFPE findings stated above      Current Nutrition Therapies:  DIET NPO    Current Intake:   Average Meal Intake: NPO        Anthropometric Measures:  Height: 5' 4 (162.6 cm)  Current Body Wt: 38.3 kg (84 lb 7 oz) (5/6), Weight source: Not specified  BMI: 14.5, Underweight (BMI less than 22) age over 74  Admission Body Weight: 84 lb 7 oz (5/6; not specified)  Ideal Body Weight (lbs) (Calculated): 130 lbs (59 kg), 65 %  Usual Body Wt: 47.6 kg (105 lb) (04/28/20; MD encounter), Percent weight change: -19.6       Edema: No data recorded  Estimated Daily Nutrient Needs:  Energy (kcal/day): 1455-1647 (Kcal/kg (38-43) Weight used: Admission (38.3kg)    Protein (g/day): 73-82 Weight Used: (Other (specify) (20% kcal))    Fluid (ml/day):   (1 ml/kcal)    Nutrition Diagnosis:    Inadequate oral intake related to swallowing difficulty as evidenced by NPO or clear liquid status due to medical condition (PEG for primary nutrition)     Moderate malnutrition,In context of chronic illness related to inadequate protein-energy intake as evidenced by Criteria as identified in malnutrition assessment    Nutrition Interventions:   Food and/or Nutrient Delivery: Continue NPO,Start tube feeding     Coordination of Nutrition Care: Continue to monitor while inpatient       Goals:      Active Goal:  (Tolerate TF at goal by RD follow up.)       Nutrition Monitoring and Evaluation:      Food/Nutrient Intake Outcomes: Enteral nutrition intake/tolerance  Physical Signs/Symptoms Outcomes: Biochemical data,GI status,Weight    Discharge Planning:    Enteral nutrition    Recardo Piety MS, RDN, LD 04/14/2021 at 4:10 PM  Contact: [phone]

## 2021-04-14 NOTE — Progress Notes (Signed)
 Reviewed notes for any new spiritual concerns            History of Present Illness:   Caleb Robinson is a 70 y.o. male with medical history of tongue cancer recently completed radiation, severe COPD, tobacco use who presented with fever, cough, SOB associated with nausea and vomiting of 1 day duration prompting him to come to the ER.  He reported ongoing tobacco use but denies using oxygen supplementation at home.  Reported compliance to his home inhalers.  He follows [facility] and recently completed radiation for his tongue cancer.  Stated that feeling well until past day.  Denies chest pain, abdominal pain, diarrhea, constipation.    At baseline, he only receives liquid in syringe by mouth but all medications and nutritions through his PEG

## 2021-04-14 NOTE — Progress Notes (Deleted)
04/13/21 2328   Dual Skin Pressure Injury Assessment   Dual Skin Pressure Injury Assessment X   Second Care Provider (Based on [place]) Danae Chen, RN   Buttocks/Ishium  Right  ( skin teaer)   Skin Integumentary   Skin Integumentary (WDL) X    Pressure  Injury Documentation No Pressure Injury Noted-Pressure Ulcer Prevention Initiated   Skin Color Appropriate for ethnicity   Skin Condition/Temp Warm   Skin Integrity Lesion (comment)  (Left LEG)   Turgor Tenting   Hair Growth Present   Wound Prevention and Protection Methods   Orientation of Wound Prevention Posterior   Location of Wound Prevention Sacrum/Coccyx   Dressing Present  No   Wound Offloading (Prevention Methods) Bed, pressure redistribution/air;Bed, pressure reduction mattress

## 2021-04-14 NOTE — Progress Notes (Signed)
 VANCO INITIAL NOTE    [facility]   Pharmacy Pharmacokinetic Monitoring Service - Vancomycin      Caleb Robinson is a 70 y.o. 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 Pharmacy consulted by [facility] for monitoring and adjustment.    Target Concentration: Goal AUC/MIC 400-600 mg*hr/L    Additional Antimicrobials:     Pertinent Laboratory Values:   Wt Readings from Last 1 Encounters:   04/13/21 38.3 kg (84 lb 8 oz)     Temp Readings from Last 1 Encounters:   04/14/21 98.2 F (36.8 C)     No components found for: PROCAL  Recent Labs     04/14/21  0312 04/13/21  1947 04/13/21  1725   BUN 29*  --  29*   CREA 0.80  --  0.90   WBC 9.3  --  8.9   PCT  --   --  0.67*   LAC  --  1.6 1.3     Estimated Creatinine Clearance: 47.2 mL/min (based on SCr of 0.8 mg/dL).    No results found for: VANCT, VANCR    MRSA Nasal Swab: not ordered. Order placed by pharmacy..    Plan:  Dosing recommendations based on Bayesian software  Start vancomycin  1gm x1 then 500mg  q12hr  Anticipated AUC of 458 and trough concentration of 14.7 at steady state  Renal labs as indicated   Vancomycin  concentrations will be ordered as clinically appropriate   Pharmacy will continue to monitor patient and adjust therapy as indicated    Thank you for the consult,  Arland Hendricks Roys, [facility]

## 2021-04-14 NOTE — Progress Notes (Signed)
Progress Notes by Elmer Ramp, MD at 04/14/21 [phone]                Author: Elmer Ramp, MD  Service: Internal Medicine  Author Type: Physician       Filed: 04/14/21 0946  Date of Service: 04/14/21 0918  Status: Signed          Editor: Elmer Ramp, MD (Physician)                           Hospitalist Progress Note     Admit Date:  04/13/2021  5:25 PM    Name:  Caleb Robinson    Age:  70 y.o.   Sex:  male   DOB:  16-Jun-1951    MRN:  [id number]    Room:  519/01      Presenting Complaint: Shortness of Breath      Reason(s) for Admission: Acute respiratory failure with hypoxia [facility]) [J96.01]         Hospital Course & Interval History:     Caleb Robinson is a 70 y.o. male with medical history of tongue cancer recently completed radiation, severe COPD, tobacco use who presented  with fever, cough, SOB associated with nausea and vomiting of 1 day duration prompting him to come to the ER.  He reported ongoing tobacco use but denies using oxygen supplementation at home.  Reported compliance to his home inhalers.  He follows  [facility] oncology and recently completed radiation for his tongue cancer.  Stated that feeling well until past day.  Denies chest pain, abdominal pain, diarrhea, constipation.   ??   At baseline, he only receives liquid in syringe by mouth but all medications and nutritions through his PEG.  He has a PICC line and gets a liter of IVF every other day to maintain hydration.   ??   In ED, O2 sat 85% on RA and was placed on 3L O2 NC.  VS otherwise stable.  CBC in CMP largely unremarkable.  Rapid COVID-19 negative.  Influenza negative.  Pro-Cal 0.69. CXR shows right perihilar opacity similar to previous imaging that could  be scarring; underlying malignancy is considered less likely but not excluded.  Given DuoNeb, 2L boluses, and Solu-Medrol in ED.      Subjective/24hr Events (04/14/21 ):   SOB improving.  Still mild-moderate, constant, worse with exertion.   Was on room air at one point but back on 3L currently.  No CP, fevers.  Still with significant cough.        Assessment & Plan:          Acute respiratory failure with hypoxia (HCC)    -Charted O2 Flow Rate (L/min): 3 l/min.  Wean as able.  May need home o2   -cont nebs        Tongue cancer ([place])    -cont chronic pain medication oxy 85m prn   -getting XRT and surgery eval as outpatient        Bacterial pneumonia    -CT chest reviewed, remarkable for RLL pneumonia likely aspiration from secretions   -change abx to unasyn.  Doxy to cover MRSA given the positive nasal swab.          COPD with acute lower respiratory infection ([facility]   -cont solumedrol.  Wean to prednisone when wheezing improved        Severe protein-calorie malnutrition ([facility]    -RD consulted for  TF        Hyperlipidemia    -Controlled. Continue home meds        Anxiety and depression    -Controlled. Continue home meds        Discharge Planning:     -home 1-2 days when oxygenation improved      Diet:  DIET NPO   DVT PPx: lovenox   Code status: DNR         Hospital Problems  as of 04/14/2021  Date Reviewed:  04/13/2021                         Codes  Class  Noted - Resolved  POA              Severe protein-calorie malnutrition ([place])  ICD-10-CM: E43   ICD-9-CM: 262    04/14/2021 - Present  Yes                        * (Principal) Acute respiratory failure with hypoxia ([place])  ICD-10-CM: J96.01   ICD-9-CM: 518.81    04/13/2021 - Present  Yes                        Bacterial pneumonia  ICD-10-CM: J15.9   ICD-9-CM: 482.9    04/13/2021 - Present  Yes                        COPD with acute lower respiratory infection ([place])  ICD-10-CM: J44.0   ICD-9-CM: 496, 519.8    04/13/2021 - Present  Yes                        Hyperlipidemia (Chronic)  ICD-10-CM: E78.5   ICD-9-CM: 272.4    04/13/2021 - Present  Yes                        Anxiety and depression (Chronic)  ICD-10-CM: F41.9, F32.A   ICD-9-CM: 300.00, 311    04/13/2021 - Present  Yes                        Tongue cancer ([place])  (Chronic)  ICD-10-CM: C02.9   ICD-9-CM: 141.9    06/27/2020 - Present  Yes                            Objective:        Patient Vitals for the past 24 hrs:            Temp  Pulse  Resp  BP  SpO2            04/14/21 0907  --  --  --  --  98 %            04/14/21 0750  98.2 ??F (36.8 ??C)  (!) 59  18  (!) 103/48  96 %     04/14/21 0307  98.2 ??F (36.8 ??C)  72  19  (!) 95/50  97 %     04/13/21 2328  97.6 ??F (36.4 ??C)  69  20  (!) 100/52  96 %     04/13/21 2158  --  71  21  (!) 101/55  97 %     04/13/21 2128  --  78  21  (!) 108/56  99 %     04/13/21 2108  --  80  16  123/69  98 %     04/13/21 2048  --  83  20  (!) 111/53  96 %     04/13/21 2028  --  77  22  (!) 108/49  97 %     04/13/21 2018  --  76  22  107/61  97 %     04/13/21 2016  --  80  18  107/61  97 %     04/13/21 1958  --  81  22  (!) 107/52  98 %     04/13/21 1948  --  82  19  (!) 129/59  98 %     04/13/21 1928  --  82  18  (!) 112/46  100 %     04/13/21 1918  --  85  24  (!) 109/48  99 %     04/13/21 1900  --  84  17  111/61  99 %     04/13/21 1843  --  84  18  102/64  98 %     04/13/21 1842  --  --  --  --  97 %     04/13/21 1830  --  84  17  (!) 116/53  (!) 85 %     04/13/21 1817  --  85  23  (!) 113/57  97 %     04/13/21 1802  --  85  18  (!) 118/58  97 %     04/13/21 1716  --  --  --  --  90 %            04/13/21 1714  97.8 ??F (36.6 ??C)  (!) 104  20  (!) 111/54  (!) 85 %        Oxygen Therapy   O2 Sat (%): 98 % (04/14/21 0907)   Pulse via Oximetry: 76 beats per minute (04/14/21 0907)   O2 Device: Nasal cannula (04/14/21 0907)   O2 Flow Rate (L/min): 3 l/min (04/14/21 0907)   FIO2 (%): 32 % (04/13/21 1842)      Estimated body mass index is 14.5 kg/m?? as calculated from the following:     Height as of this encounter: 5' 4"  (1.626 m).     Weight as of this encounter: 38.3 kg (84 lb 8 oz).      Intake/Output Summary (Last 24 hours) at 04/14/2021 0918   Last data filed at 04/14/2021 0624     Gross per 24 hour        Intake  320 ml        Output  150 ml        Net   170 ml             Physical Exam:    Blood pressure (!) 103/48, pulse  (!) 59, temperature 98.2 ??F (36.8 ??C), resp. rate 18, height 5' 4"  (1.626 m), weight 38.3 kg (84 lb 8 oz), SpO2 98 %.   General:    malnourished.  No overt distress   Head:  Normocephalic, atraumatic   Eyes:  Sclerae appear normal.  Pupils equally round.   ENT:  Nares appear normal, no drainage.  Moist oral mucosa   Neck:  No restricted ROM.  Trachea midline    CV:   RRR.    No jugular venous distension.   Lungs:  Diminished R base, no wheezing.  Respirations even, unlabored   Abdomen:   nondistended   Extremities: No cyanosis or clubbing.  No edema   Skin:     No rashes and normal coloration.   Warm and dry.     Neuro:  CN II-XII grossly intact.  Sensation intact.  A&Ox3   Psych:  Normal mood and affect.        I have reviewed ordered lab tests and independently visualized imaging below:      Recent Labs:     Recent Results (from the past 48 hour(s))     CULTURE, BLOOD          Collection Time: 04/13/21  5:25 PM       Specimen: Blood         Result  Value  Ref Range            Special Requests:  LEFT   Antecubital             Culture result:  NO GROWTH AFTER 13 HOURS          LACTIC ACID          Collection Time: 04/13/21  5:25 PM         Result  Value  Ref Range            Lactic acid  1.3  0.4 - 2.0 MMOL/L       CBC WITH AUTOMATED DIFF          Collection Time: 04/13/21  5:25 PM         Result  Value  Ref Range            WBC  8.9  4.3 - 11.1 K/uL       RBC  3.89 (L)  4.23 - 5.6 M/uL       HGB  12.4 (L)  13.6 - 17.2 g/dL       HCT  39.0 (L)  41.1 - 50.3 %       MCV  100.3 (H)  79.6 - 97.8 FL       MCH  31.9  26.1 - 32.9 PG       MCHC  31.8  31.4 - 35.0 g/dL       RDW  16.8 (H)  11.9 - 14.6 %       PLATELET  225  150 - 450 K/uL       MPV  11.7  9.4 - 12.3 FL       ABSOLUTE NRBC  0.00  0.0 - 0.2 K/uL       DF  AUTOMATED          NEUTROPHILS  85 (H)  43 - 78 %       LYMPHOCYTES  5 (L)  13 - 44 %       MONOCYTES  9  4.0 - 12.0 %       EOSINOPHILS   0 (L)  0.5 - 7.8 %       BASOPHILS  0  0.0 - 2.0 %       IMMATURE GRANULOCYTES  0  0.0 - 5.0 %       ABS. NEUTROPHILS  7.6  1.7 - 8.2 K/UL       ABS. LYMPHOCYTES  0.5  0.5 - 4.6 K/UL       ABS. MONOCYTES  0.8  0.1 - 1.3 K/UL       ABS. EOSINOPHILS  0.0  0.0 - 0.8 K/UL       ABS. BASOPHILS  0.0  0.0 - 0.2 K/UL       ABS. IMM. GRANS.  0.0  0.0 - 0.5 K/UL       METABOLIC PANEL, COMPREHENSIVE          Collection Time: 04/13/21  5:25 PM         Result  Value  Ref Range            Sodium  141  138 - 145 mmol/L       Potassium  4.1  3.5 - 5.1 mmol/L       Chloride  105  98 - 107 mmol/L       CO2  29  21 - 32 mmol/L       Anion gap  7  7 - 16 mmol/L       Glucose  109 (H)  65 - 100 mg/dL       BUN  29 (H)  8 - 23 MG/DL       Creatinine  0.90  0.8 - 1.5 MG/DL       GFR est AA  >60  >60 ml/min/1.30m       GFR est non-AA  >60  >60 ml/min/1.743m      Calcium  9.6  8.3 - 10.4 MG/DL       Bilirubin, total  0.4  0.2 - 1.1 MG/DL       ALT (SGPT)  22  12 - 65 U/L       AST (SGOT)  16  15 - 37 U/L       Alk. phosphatase  96  50 - 136 U/L       Protein, total  7.4  6.3 - 8.2 g/dL       Albumin  3.6  3.2 - 4.6 g/dL       Globulin  3.8 (H)  2.3 - 3.5 g/dL       A-G Ratio  0.9 (L)  1.2 - 3.5         PROCALCITONIN          Collection Time: 04/13/21  5:25 PM         Result  Value  Ref Range            Procalcitonin  0.67 (H)  0.00 - 0.49 ng/mL       EKG, 12 LEAD, INITIAL          Collection Time: 04/13/21  5:40 PM         Result  Value  Ref Range            Ventricular Rate  89  BPM       Atrial Rate  83  BPM       P-R Interval  129  ms       QRS Duration  96  ms       Q-T Interval  378  ms       QTC Calculation (Bezet)  460  ms       Calculated P Axis  66  degrees       Calculated R Axis  -126  degrees       Calculated T Axis  58  degrees       Diagnosis                 Sinus rhythm   Atrial premature complexes  Probable right ventricular hypertrophy      Confirmed by Driscilla Grammes ([id number]) on 04/14/2021 7:12:57 AM          COVID-19  RAPID TEST          Collection Time: 04/13/21  6:26 PM         Result  Value  Ref Range            Specimen source  Nasopharyngeal          COVID-19 rapid test  Not detected  NOTD         INFLUENZA A & B AG (RAPID TEST)          Collection Time: 04/13/21  6:44 PM         Result  Value  Ref Range            Influenza A Ag  Negative  NEG         Influenza B Ag  Negative  NEG         Source  Nasopharyngeal          CULTURE, BLOOD          Collection Time: 04/13/21  7:47 PM       Specimen: Blood         Result  Value  Ref Range            Special Requests:  LEFT   Antecubital             Culture result:  NO GROWTH AFTER 10 HOURS          LACTIC ACID          Collection Time: 04/13/21  7:47 PM         Result  Value  Ref Range            Lactic acid  1.6  0.4 - 2.0 MMOL/L       D DIMER          Collection Time: 04/13/21 10:41 PM         Result  Value  Ref Range            D DIMER  1.27 (H)  <0.56 ug/ml(FEU)       MSSA/MRSA SC BY PCR, NASAL SWAB          Collection Time: 04/14/21  1:09 AM       Specimen: Nasal swab         Result  Value  Ref Range            Special Requests:  NO SPECIAL REQUESTS          Culture result:  (A)                MRSA target DNA detected, SA target DNA detected.       A positive test result does not necessarily indicate the presence of viable organisms. It is  however, presumptive for the presence of MRSA or SA.            Culture result:                  RESULTS VERIFIED, PHONED TO AND READ BACK BY   Emelia Loron RN @ [phone] CAL          METABOLIC PANEL, BASIC          Collection Time: 04/14/21  3:12 AM         Result  Value  Ref Range            Sodium  141  138 - 145 mmol/L       Potassium  4.2  3.5 - 5.1 mmol/L       Chloride  109 (H)  98 - 107 mmol/L       CO2  28  21 - 32 mmol/L       Anion gap  4 (L)  7 - 16 mmol/L       Glucose  156 (H)  65 - 100 mg/dL       BUN  29 (H)  8 - 23 MG/DL       Creatinine  0.80  0.8 - 1.5 MG/DL       GFR est AA  >60  >60 ml/min/1.60m       GFR est non-AA  >60   >60 ml/min/1.738m      Calcium  8.4  8.3 - 10.4 MG/DL       CBC W/O DIFF          Collection Time: 04/14/21  3:12 AM         Result  Value  Ref Range            WBC  9.3  4.3 - 11.1 K/uL       RBC  3.14 (L)  4.23 - 5.6 M/uL       HGB  10.1 (L)  13.6 - 17.2 g/dL       HCT  31.7 (L)  41.1 - 50.3 %       MCV  101.0 (H)  79.6 - 97.8 FL       MCH  32.2  26.1 - 32.9 PG       MCHC  31.9  31.4 - 35.0 g/dL       RDW  16.8 (H)  11.9 - 14.6 %       PLATELET  174  150 - 450 K/uL       MPV  12.0  9.4 - 12.3 FL       ABSOLUTE NRBC  0.00  0.0 - 0.2 K/uL       URINE MICROSCOPIC          Collection Time: 04/14/21  6:31 AM         Result  Value  Ref Range            WBC  0-3  0 /hpf       RBC  3-5  0 /hpf       Epithelial cells  0-3  0 /hpf       Bacteria  TRACE  0 /hpf       Casts  0  0 /lpf       Crystals, urine  0  0 /LPF       Mucus  1+ (H)  0 /lpf            Other observations  RESULTS VERIFIED MANUALLY                All Micro Results               Procedure  Component  Value  Units  Date/Time           BLOOD CULTURE [[id number]]Collected: 04/13/21 1725            Order Status: Completed  Specimen: Blood  Updated: 04/14/21 [id number]  Special Requests:  --                  LEFT   Antecubital                   Culture result:  NO GROWTH AFTER 13 HOURS                BLOOD CULTURE [[id number]]  Collected: 04/13/21 1947            Order Status: Completed  Specimen: Blood  Updated: 04/14/21 0650                Special Requests:  --                  LEFT   Antecubital                   Culture result:  NO GROWTH AFTER 10 HOURS                MSSA/MRSA SC BY PCR, NASAL SWAB [[id number]]  (Abnormal)  Collected: 04/14/21 0109            Order Status: Completed  Specimen: Nasal swab  Updated: 04/14/21 0244                Special Requests:  NO SPECIAL REQUESTS                    Culture result:                 MRSA target DNA detected, SA target DNA detected.       A positive test result does not necessarily indicate the presence  of viable organisms.  It is however, presumptive for the presence of MRSA or SA.                                            RESULTS VERIFIED, PHONED TO AND READ BACK BY   Emelia Loron RN @ 7829 CAL              CULTURE, RESPIRATORY/SPUTUM/BRONCH Verdie Drown [[id number]]              Order Status: Sent  Specimen: Sputum             INFLUENZA A & B AG (RAPID TEST) [[id number]]  Collected: 04/13/21 1844            Order Status: Completed  Specimen: Nasal washing  Updated: 04/13/21 1904                Influenza A Ag  Negative                  Comment:  NEGATIVE FOR THE PRESENCE OF INFLUENZA A ANTIGEN   INFECTION DUE TO INFLUENZA A CANNOT BE RULED OUT.   BECAUSE THE ANTIGEN PRESENT IN THE SAMPLE MAY BE BELOW   THE DETECTION LIMIT OF THE TEST.   A NEGATIVE TEST IS PRESUMPTIVE AND IT IS RECOMMENDED THAT THESE RESULTS BE CONFIRMED BY VIRAL CULTURE OR AN FDA-CLEARED INFLUENZA A AND B MOLECULAR ASSAY.                             Influenza B Ag  Negative  Comment:  NEGATIVE FOR THE PRESENCE OF INFLUENZA B ANTIGEN   INFECTION DUE TO INFLUENZA B CANNOT BE RULED OUT.   BECAUSE THE ANTIGEN PRESENT IN THE SAMPLE MAY BE BELOW   THE DETECTION LIMIT OF THE TEST.   A NEGATIVE TEST IS PRESUMPTIVE AND IT IS RECOMMENDED THAT THESE RESULTS BE CONFIRMED BY VIRAL CULTURE OR AN FDA-CLEARED INFLUENZA A AND B MOLECULAR ASSAY.                             Source  Nasopharyngeal                COVID-19 RAPID TEST [[id number]]  Collected: 04/13/21 1826            Order Status: Completed  Specimen: Nasopharyngeal  Updated: 04/13/21 1859                Specimen source  Nasopharyngeal              COVID-19 rapid test  Not detected                  Comment:        The specimen is NEGATIVE for SARS-CoV-2, the novel coronavirus associated with COVID-19.   A negative result does not rule out COVID-19.         This test has been authorized by the FDA under an Emergency Use Authorization (EUA) for use by authorized laboratories.          Fact  sheet for Healthcare Providers: LittleDVDs.dk   Fact sheet for Patients: SatelliteRebate.it         Methodology: Isothermal Nucleic Acid Amplification                        INFLUENZA A & B AG (RAPID TEST) [[id number]]  Collected: 04/13/21 1826            Order Status: Canceled  Specimen: Nasopharyngeal                  Other Studies:   XR CHEST PORT      Result Date: 04/13/2021   PORTABLE CHEST 1 VIEW HISTORY: Vomiting and fever for 2 days. Loss of appetite. COPD. Head and neck cancer. COMPARISON: February 14, 2021 FINDINGS: Emphysematous changes are present. An opacity in the right perihilar lung is similar in appearance to the previous  study and may be scarring. There is no new consolidation. A right-sided PICC line terminates in the SVC.       1. COPD. 2. Right perihilar opacity which is similar in appearance to previous imaging and may be scarring from prior inflammation. Underlying malignancy is considered less likely but is not excluded. Attention to this region is recommended at the time  of follow-up CT imaging.      CT CHEST PULMONARY EMBOLISM      Result Date: 04/14/2021   EXAM: CT angiogram chest. HISTORY: hx of cancer, presented with hypoxia.  TECHNIQUE: CT angiographic images of the chest were obtained following intravenous administration of contrast. Imaging was performed utilizing PE protocol. Examination was performed  in the axial plane and coronal reconstructions were performed. 3-D MIPS reconstructions of the chest were obtained. Dose reduction technique used: Automated exposure control/Adjustment of the mA and/or kV according to patient size/Use of iterative reconstruction  technique. 100 mL of Isovue-370 were utilized. COMPARISON: Noncontrast CT chest dated 01/17/2021 FINDINGS: There is adequate opacification  of the pulmonary arterial tree. No significant filling defects are identified to suggest pulmonary embolism. Main  pulmonary artery is prominent in  caliber measuring 3.5 cm. The heart is not enlarged and there is no pericardial effusion. The great vessels appear normal. The visualized thyroid is unremarkable. There are no pathologically enlarged mediastinal or axillary  lymph nodes. There are a few prominent bilateral hilar lymph nodes. There is a small amount of fluid/secretions in the dependent portion of the distal trachea and bilateral mainstem bronchus. Diffuse emphysematous changes are appreciated. There is no  pneumothorax. There is a 7.3 mm nodule in the right upper lobe. Allowing for differences in positioning and technique, there is no significant change. There is a focal area of consolidation in the right lower lobe. It measures roughly 3.1 x 1.9 cm. There  is mild left basilar infiltrate or atelectasis. There are small bilateral pleural effusions. A band of scarring is again seen at the base of the right upper lobe along the minor fissure. Mild scarring or atelectasis is noted anterior to the left major  fissure in the lingula. There is bilateral apical scarring. The visualized upper abdomen shows a PEG tube. No other obvious significant changes are observed. Osseous structures are within normal limits.       1. No evidence of pulmonary embolism. 2. The main pulmonary artery is enlarged measuring 3.5 cm. This finding suggests pulmonary hypertension. 3. Interval development of focal area of apparent consolidation in the right lower lobe. Mild infiltrate or  atelectasis is seen at the left base. There are small bilateral pleural effusions. 4. Fluid/secretions are seen in the dependent portion of the distal trachea and bilateral mainstem bronchus. 5. COPD. Stable right upper lobe nodule. Chronic lung changes.          Current Meds:     Current Facility-Administered Medications          Medication  Dose  Route  Frequency           ?  cefepime (MAXIPIME) 2 g in 0.9% sodium chloride (MBP/ADV) 100 mL MBP   2 g  IntraVENous  Q12H     ?  vancomycin  (VANCOCIN) 500 mg in 0.9% sodium chloride (MBP/ADV) 100 mL MBP   500 mg  IntraVENous  Q12H     ?  oxyCODONE IR (ROXICODONE) tablet 10 mg   10 mg  Oral  Q4H PRN     ?  morphine injection 2 mg   2 mg  IntraVENous  ONCE     ?  sodium chloride (NS) flush 5-10 mL   5-10 mL  IntraVENous  Q8H     ?  sodium chloride (NS) flush 5-10 mL   5-10 mL  IntraVENous  PRN     ?  budesonide (PULMICORT) 500 mcg/2 ml nebulizer suspension   500 mcg  Nebulization  BID     ?  fluticasone propionate (FLONASE) 50 mcg/actuation nasal spray 2 Spray   2 Spray  Both Nostrils  DAILY     ?  albuterol-ipratropium (DUO-NEB) 2.5 MG-0.5 MG/3 ML   3 mL  Nebulization  Q6H RT     ?  gabapentin (NEURONTIN) capsule 200 mg   200 mg  Oral  TID PRN     ?  oxyCODONE IR (ROXICODONE) tablet 10 mg   10 mg  Oral  Q6H PRN     ?  sertraline (ZOLOFT) tablet 50 mg   50 mg  Per G Tube  DAILY     ?  sodium chloride (NS) flush 5-40 mL   5-40 mL  IntraVENous  Q8H     ?  sodium chloride (NS) flush 5-40 mL   5-40 mL  IntraVENous  PRN     ?  acetaminophen (TYLENOL) tablet 650 mg   650 mg  Oral  Q6H PRN          Or           ?  acetaminophen (TYLENOL) suppository 650 mg   650 mg  Rectal  Q6H PRN     ?  polyethylene glycol (MIRALAX) packet 17 g   17 g  Oral  DAILY PRN     ?  ondansetron (ZOFRAN ODT) tablet 4 mg   4 mg  Oral  Q8H PRN          Or           ?  ondansetron (ZOFRAN) injection 4 mg   4 mg  IntraVENous  Q6H PRN           ?  enoxaparin (LOVENOX) injection 30 mg   30 mg  SubCUTAneous  DAILY     ?  tuberculin injection 5 Units   5 Units  IntraDERMal  ONCE     ?  guaiFENesin-dextromethorphan (ROBITUSSIN DM) 100-10 mg/5 mL syrup 10 mL   10 mL  Oral  Q6H PRN     ?  methylPREDNISolone (PF) (SOLU-MEDROL) injection 40 mg   40 mg  IntraVENous  Q12H     ?  nicotine (NICODERM CQ) 14 mg/24 hr patch 1 Patch   1 Patch  TransDERmal  DAILY PRN     ?  cholecalciferol (VITAMIN D3) (1000 Units /25 mcg) tablet 1,000 Units   1,000 Units  PEG Tube  DAILY     ?  azithromycin (ZITHROMAX)  tablet 500 mg   500 mg  Per G Tube  DAILY     ?  guaiFENesin (ROBITUSSIN) 100 mg/5 mL oral liquid 400 mg   400 mg  Per G Tube  BID     ?  rosuvastatin (CRESTOR) tablet 10 mg   10 mg  Per G Tube  QHS     ?  traZODone (DESYREL) tablet 50 mg   50 mg  Per G Tube  QHS     ?  LORazepam (ATIVAN) tablet 0.5 mg   0.5 mg  Per G Tube  DAILY PRN     ?  albuterol (PROVENTIL VENTOLIN) nebulizer solution 2.5 mg   2.5 mg  Nebulization  Q6H PRN     ?  famotidine (PEPCID) tablet 20 mg   20 mg  Per G Tube  DAILY           ?  0.9% sodium chloride infusion   50 mL/hr  IntraVENous  CONTINUOUS           Signed:   Elmer Ramp, MD      Part of this note may have been written by using a voice dictation software.  The note has been proof read but may still contain some grammatical/other typographical errors.

## 2021-04-14 NOTE — Progress Notes (Signed)
Problem: Falls - Risk of  Goal: *Absence of Falls  Description: Document [name] Fall Risk and appropriate interventions in the flowsheet.  Outcome: Progressing Towards Goal  Note: Fall Risk Interventions:            Medication Interventions: Assess postural VS orthostatic hypotension,Bed/chair exit alarm,Patient to call before getting OOB                   Problem: Patient Education: Go to Patient Education Activity  Goal: Patient/Family Education  Outcome: Progressing Towards Goal     Problem: Tissue Perfusion - Cardiopulmonary, Altered  Goal: *Optimize tissue perfusion  Outcome: Progressing Towards Goal  Goal: *Absence of hypoxia  Outcome: Progressing Towards Goal     Problem: Patient Education: Go to Patient Education Activity  Goal: Patient/Family Education  Outcome: Progressing Towards Goal

## 2021-04-14 NOTE — Progress Notes (Signed)
 END OF SHIFT NOTE:    Intake/Output  05/06 1901 - 05/07 0700  In: 320 [I.V.:320]  Out: -    Voiding: YES  Catheter: NO  Drain:              Stool:  0 occurrences.         Emesis:  0 occurrences.          VITAL SIGNS  Patient Vitals for the past 12 hrs:   Temp Pulse Resp BP SpO2   04/14/21 0307 98.2 F (36.8 C) 72 19 (!) 95/50 97 %   04/13/21 2328 97.6 F (36.4 C) 69 20 (!) 100/52 96 %   04/13/21 2158 -- 71 21 (!) 101/55 97 %   04/13/21 2128 -- 78 21 (!) 108/56 99 %   04/13/21 2108 -- 80 16 123/69 98 %   04/13/21 2048 -- 83 20 (!) 111/53 96 %   04/13/21 2028 -- 77 22 (!) 108/49 97 %   04/13/21 2018 -- 76 22 107/61 97 %   04/13/21 2016 -- 80 18 107/61 97 %   04/13/21 1958 -- 81 22 (!) 107/52 98 %   04/13/21 1948 -- 82 19 (!) 129/59 98 %   04/13/21 1928 -- 82 18 (!) 112/46 100 %   04/13/21 1918 -- 85 24 (!) 109/48 99 %   04/13/21 1900 -- 84 17 111/61 99 %       Pain Assessment  Pain 1  Pain Scale 1: Numeric (0 - 10) (04/14/21 0307)  Pain Intensity 1: 0 (04/14/21 0307)  Patient Stated Pain Goal: 0 (04/14/21 0307)    Ambulating  Yes    Additional Information:   PPD placed   Wound care consult  + MRSA  CT scan done  Urine microscopic     To do: sputum culture + Mic-Key button    Shift report given to oncoming nurse at the bedside.    Thien Nguyen, RN

## 2021-04-15 LAB — CBC
Hematocrit: 28.2 % — ABNORMAL LOW (ref 41.1–50.3)
Hemoglobin: 9 g/dL — ABNORMAL LOW (ref 13.6–17.2)
MCH: 32.1 PG (ref 26.1–32.9)
MCHC: 31.9 g/dL (ref 31.4–35.0)
MCV: 100.7 FL — ABNORMAL HIGH (ref 79.6–97.8)
MPV: 12.4 FL — ABNORMAL HIGH (ref 9.4–12.3)
NRBC Absolute: 0 10*3/uL (ref 0.0–0.2)
Platelets: 166 10*3/uL (ref 150–450)
RBC: 2.8 M/uL — ABNORMAL LOW (ref 4.23–5.6)
RDW: 17.2 % — ABNORMAL HIGH (ref 11.9–14.6)
WBC: 8.3 10*3/uL (ref 4.3–11.1)

## 2021-04-15 LAB — PHOSPHORUS
Phosphorus: 1.5 MG/DL — ABNORMAL LOW (ref 2.3–3.7)
Phosphorus: 1.5 MG/DL — ABNORMAL LOW (ref 2.3–3.7)

## 2021-04-15 LAB — MAGNESIUM
Magnesium: 2 mg/dL (ref 1.8–2.4)
Magnesium: 2 mg/dL (ref 1.8–2.4)

## 2021-04-15 LAB — BASIC METABOLIC PANEL
Anion Gap: 5 mmol/L — ABNORMAL LOW (ref 7–16)
BUN: 30 MG/DL — ABNORMAL HIGH (ref 8–23)
CO2: 27 mmol/L (ref 21–32)
Calcium: 8.5 MG/DL (ref 8.3–10.4)
Chloride: 111 mmol/L — ABNORMAL HIGH (ref 98–107)
Creatinine: 0.7 MG/DL — ABNORMAL LOW (ref 0.8–1.5)
EGFR IF NonAfrican American: >60 mL/min/{1.73_m2} (ref >60)
GFR African American: >60 mL/min/{1.73_m2} (ref >60)
Glucose: 169 mg/dL — ABNORMAL HIGH (ref 65–100)
Potassium: 3.9 mmol/L (ref 3.5–5.1)
Sodium: 143 mmol/L (ref 138–145)

## 2021-04-15 LAB — METABOLIC PANEL, BASIC
Anion gap: 5 mmol/L — ABNORMAL LOW (ref 7–16)
BUN: 30 MG/DL — ABNORMAL HIGH (ref 8–23)
CO2: 27 mmol/L (ref 21–32)
Calcium: 8.5 MG/DL (ref 8.3–10.4)
Chloride: 111 mmol/L — ABNORMAL HIGH (ref 98–107)
Creatinine: 0.7 MG/DL — ABNORMAL LOW (ref 0.8–1.5)
GFR est AA: >60 mL/min/{1.73_m2} (ref >60)
GFR est non-AA: >60 mL/min/{1.73_m2} (ref >60)
Glucose: 169 mg/dL — ABNORMAL HIGH (ref 65–100)
Potassium: 3.9 mmol/L (ref 3.5–5.1)
Sodium: 143 mmol/L (ref 138–145)

## 2021-04-15 LAB — CBC W/O DIFF
ABSOLUTE NRBC: 0 10*3/uL (ref 0.0–0.2)
HCT: 28.2 % — ABNORMAL LOW (ref 41.1–50.3)
HGB: 9 g/dL — ABNORMAL LOW (ref 13.6–17.2)
MCH: 32.1 PG (ref 26.1–32.9)
MCHC: 31.9 g/dL (ref 31.4–35.0)
MCV: 100.7 FL — ABNORMAL HIGH (ref 79.6–97.8)
MPV: 12.4 FL — ABNORMAL HIGH (ref 9.4–12.3)
PLATELET: 166 10*3/uL (ref 150–450)
RBC: 2.8 M/uL — ABNORMAL LOW (ref 4.23–5.6)
RDW: 17.2 % — ABNORMAL HIGH (ref 11.9–14.6)
WBC: 8.3 10*3/uL (ref 4.3–11.1)

## 2021-04-15 LAB — PLEASE READ & DOCUMENT PPD TEST IN 24 HRS
PPD: NEGATIVE Negative
mm Induration: 0 mm (ref 0–5)

## 2021-04-15 MED ORDER — MORPHINE 2 MG/ML INJECTION
2 mg/mL | INTRAMUSCULAR | Status: DC | PRN
Start: 2021-04-15 — End: 2021-04-16
  Administered 2021-04-15 – 2021-04-16 (×2): via INTRAVENOUS

## 2021-04-15 MED ORDER — POTASSIUM & SODIUM PHOSPHATES 280 MG-160 MG-250 MG ORAL POWDER PACKET
280-160-250 mg | Freq: Four times a day (QID) | ORAL | Status: DC
Start: 2021-04-15 — End: 2021-04-15

## 2021-04-15 MED ORDER — PREDNISONE 20 MG TAB
20 mg | Freq: Every day | ORAL | Status: DC
Start: 2021-04-15 — End: 2021-04-15

## 2021-04-15 MED ORDER — POTASSIUM & SODIUM PHOSPHATES 280 MG-160 MG-250 MG ORAL POWDER PACKET
280-160-250 mg | Freq: Two times a day (BID) | ORAL | Status: DC
Start: 2021-04-15 — End: 2021-04-15

## 2021-04-15 MED ORDER — PREDNISOLONE 15 MG/5 ML ORAL SOLN
15 mg/5 mL | Freq: Every day | ORAL | Status: DC
Start: 2021-04-15 — End: 2021-04-16
  Administered 2021-04-15 – 2021-04-16 (×2): via GASTROSTOMY

## 2021-04-15 MED FILL — DOXY-100 100 MG INTRAVENOUS SOLUTION: 100 mg | INTRAVENOUS | Qty: 100

## 2021-04-15 MED FILL — OXYCODONE 5 MG/5 ML ORAL SOLN: 5 mg/ mL | ORAL | Qty: 10

## 2021-04-15 MED FILL — AMPICILLIN-SULBACTAM 3 GRAM SOLUTION FOR INJECTION: 3 gram | INTRAMUSCULAR | Qty: 3

## 2021-04-15 MED FILL — PREDNISOLONE 15 MG/5 ML ORAL SOLN: 15 mg/5 mL | ORAL | Qty: 13.33

## 2021-04-15 MED FILL — IPRATROPIUM-ALBUTEROL 2.5 MG-0.5 MG/3 ML NEB SOLUTION: 2.5 mg-0.5 mg/3 ml | RESPIRATORY_TRACT | Qty: 3

## 2021-04-15 MED FILL — TRAZODONE 50 MG TAB: 50 mg | ORAL | Qty: 1

## 2021-04-15 MED FILL — SOLU-MEDROL (PF) 40 MG/ML SOLUTION FOR INJECTION: 40 mg/mL | INTRAMUSCULAR | Qty: 1

## 2021-04-15 MED FILL — ROSUVASTATIN 5 MG TAB: 5 mg | ORAL | Qty: 2

## 2021-04-15 MED FILL — GUAIFENESIN 100 MG/5 ML ORAL LIQUID: 100 mg/5 mL | ORAL | Qty: 20

## 2021-04-15 MED FILL — LOVENOX 30 MG/0.3 ML SUB-Q SYRINGE: 30 mg/0.3 mL | SUBCUTANEOUS | Qty: 0.3

## 2021-04-15 MED FILL — MORPHINE 2 MG/ML INJECTION: 2 mg/mL | INTRAMUSCULAR | Qty: 1

## 2021-04-15 NOTE — Progress Notes (Signed)
 END OF SHIFT NOTE:    Intake/Output  05/07 1901 - 05/08 0700  In: 870   Out: -    Voiding: YES  Catheter: NO  Drain:              Stool:  0 occurrences.         Emesis:  0 occurrences.          VITAL SIGNS  Patient Vitals for the past 12 hrs:   Temp Pulse Resp BP SpO2   04/15/21 0404 98 F (36.7 C) 70 18 (!) 107/48 92 %   04/15/21 0136 -- -- -- -- 90 %   04/15/21 0011 97.9 F (36.6 C) 75 19 (!) 115/55 95 %   04/14/21 1940 98.7 F (37.1 C) 76 18 (!) 111/47 94 %       Pain Assessment  Pain 1  Pain Scale 1: Numeric (0 - 10) (04/14/21 1952)  Pain Intensity 1: 0 (04/14/21 1952)  Patient Stated Pain Goal: 0 (04/14/21 1952)  Pain Reassessment 1: Patient resting w/respiratory rate greater than 10 (04/14/21 1520)  Pain Onset 1: chronic (04/14/21 1330)  Pain Location 1: Back;Mouth (04/14/21 1330)  Pain Description 1: Sore (04/14/21 1330)  Pain Intervention(s) 1: Medication (see MAR) (04/14/21 1330)    Ambulating  Yes    Additional Information:   Tube feeding x3  Morphine x1 ordered q4hr prn  Daily dressing change on L leg     Shift report given to oncoming nurse at the bedside.    Thien Nguyen, RN

## 2021-04-15 NOTE — Progress Notes (Signed)
 OCCUPATIONAL THERAPY ASSESSMENT: Initial Assessment and Discharge OT Treatment Day #      Caleb Robinson is a 70 y.o. male   PRIMARY DIAGNOSIS: Aspiration pneumonia (HCC)  Acute respiratory failure with hypoxia (HCC) [J96.01]       Reason for Referral:  Aspiration pna  ICD-10: Treatment Diagnosis: Generalized Muscle Weakness (M62.81)  INPATIENT: Payor: AARP MEDICARE COMPLETE / Plan: [facility] AARP MEDICARE COMPLETE / Product Type: Managed Care Medicare /   ASSESSMENT:     REHAB RECOMMENDATIONS:   Recommendation to date pending progress:  Setting:  . No further skilled therapy after discharge from hospital  Equipment:   . None     PRIOR LEVEL OF FUNCTION:  (Prior to Hospitalization)  INITIAL/CURRENT LEVEL OF FUNCTION:  (Based on today's evaluation)   Bathing:  . Independent  Dressing:  . Independent  Feeding/Grooming:  . Independent  Toileting:  . Independent  Functional Mobility:  . Independent Bathing:  . Independent  Dressing:  . Independent  Feeding/Grooming:  . Independent  Toileting:  . Independent  Functional Mobility:  . Independent     ASSESSMENT:  Caleb Robinson demonstrated independence with ADLs and mobility for ADLs w/o an AD. Pt tolerated well w/o c/o fatigue. Pt presented at his functional baseline and does not require further skilled OT services at this time. No d/c needs.      SUBJECTIVE:   Caleb Robinson states, I'm doing fine    SOCIAL HISTORY/LIVING ENVIRONMENT: Lives w/ roommate, independent, no AD  Home Environment: Apartment  One/Two Story Residence: One story  Living Alone: No  Support Systems: Other Family Member(s),Friend/Neighbor    OBJECTIVE:     PAIN: VITAL SIGNS: LINES/DRAINS:   Pre Treatment: Pain Screen  Pain Scale 1: Numeric (0 - 10)  Pain Intensity 1: 0  Post Treatment: 0   O2 Device: Nasal cannula     GROSS EVALUATION:  BUE Within Functional Limits Abnormal/ Functional Abnormal/ Non-Functional (see comments) Not Tested Comments:   AROM [x]  []  []  []     PROM []  []  []  []      Strength [x]  []  []  []     Balance [x]  []  []  []     Posture []  []  []  []     Sensation []  []  []  []     Coordination [x]  []  []  []     Tone []  []  []  []     Edema []  []  []  []     Activity Tolerance [x]  []  []  []      []  []  []  []       COGNITION/  PERCEPTION: Intact Impaired   (see comments) Comments:   Orientation [x]  []     Vision [x]  []     Hearing [x]  []     Judgment/ Insight [x]  []     Attention [x]  []     Memory [x]  []     Command Following [x]  []     Emotional Regulation [x]  []      []  []       ACTIVITIES OF DAILY LIVING: I Mod I S SBA CGA Min Mod Max Total NT Comments   BASIC ADLs:              Bathing/ Showering []  []  []  []  []  []  []  []  []  []     Toileting [x]  []  []  []  []  []  []  []  []  []     Dressing [x]  []  []  []  []  []  []  []  []  []     Feeding []  []  []  []  []  []  []  []  []  []     Grooming []  []  []  []  []  []  []  []  []  []   Personal Device Care []  []  []  []  []  []  []  []  []  []     Functional Mobility [x]  []  []  []  []  []  []  []  []  []     I=Independent, Mod I=Modified Independent, S=Supervision, SBA=Standby Assistance, CGA=Contact Guard Assistance,   Min=Minimal Assistance, Mod=Moderate Assistance, Max=Maximal Assistance, Total=Total Assistance, NT=Not Tested    MOBILITY: I Mod I S SBA CGA Min Mod Max Total  NT x2 Comments:   Supine to sit []  []  []  []  []  []  []  []  []  []  []     Sit to supine []  []  []  []  []  []  []  []  []  []  []     Sit to stand [x]  []  []  []  []  []  []  []  []  []  []     Bed to chair [x]  []  []  []  []  []  []  []  []  []  []     I=Independent, Mod I=Modified Independent, S=Supervision, SBA=Standby Assistance, CGA=Contact Guard Assistance,   Min=Minimal Assistance, Mod=Moderate Assistance, Max=Maximal Assistance, Total=Total Assistance, NT=Not Tested    [employer] AM-PACT "6 Clicks"   Daily Activity Inpatient Short Form        How much help from another person does the patient currently need... Total A Lot A Little None   1.  Putting on and taking off regular lower body clothing?   []  1   []  2   []  3   [x]  4   2.  Bathing (including washing, rinsing,  drying)?   []  1   []  2   []  3   [x]  4   3.  Toileting, which includes using toilet, bedpan or urinal?   []  1   []  2   []  3   [x]  4   4.  Putting on and taking off regular upper body clothing?   []  1   []  2   []  3   [x]  4   5.  Taking care of personal grooming such as brushing teeth?   []  1   []  2   []  3   [x]  4   6.  Eating meals?   []  1   []  2   []  3   [x]  4    2007, Trustees of [address], under license to [place], [place]. All rights reserved     Score:  Initial: 24 Most Recent: X (Date: -- )   Interpretation of Tool:  Represents activities that are increasingly more difficult (i.e. Bed mobility, Transfers, Gait).        TREATMENT:     EVALUATION: Low Complexity : (Untimed Charge)      AFTER TREATMENT POSITION/PRECAUTIONS:  Chair, Needs within reach and RN notified    INTERDISCIPLINARY COLLABORATION:  RN/PCT    TOTAL TREATMENT DURATION:  OT Patient Time In/Time Out  Time In: 1014  Time Out: 1023    Laneta GORMAN Crew, OT

## 2021-04-15 NOTE — Progress Notes (Signed)
Problem: Falls - Risk of  Goal: *Absence of Falls  Description: Document [name] Fall Risk and appropriate interventions in the flowsheet.  Outcome: Progressing Towards Goal  Note: Fall Risk Interventions:            Medication Interventions: Patient to call before getting OOB,Teach patient to arise slowly                   Problem: Patient Education: Go to Patient Education Activity  Goal: Patient/Family Education  Outcome: Progressing Towards Goal     Problem: Tissue Perfusion - Cardiopulmonary, Altered  Goal: *Optimize tissue perfusion  Outcome: Progressing Towards Goal  Goal: *Absence of hypoxia  Outcome: Progressing Towards Goal     Problem: Patient Education: Go to Patient Education Activity  Goal: Patient/Family Education  Outcome: Progressing Towards Goal     Problem: Risk for Spread of Infection  Goal: Prevent transmission of infectious organism to others  Description: Prevent the transmission of infectious organisms to other patients, staff members, and visitors.  Outcome: Progressing Towards Goal     Problem: Patient Education:  Go to Education Activity  Goal: Patient/Family Education  Outcome: Progressing Towards Goal     Problem: Pain  Goal: *Control of Pain  Outcome: Progressing Towards Goal  Goal: *PALLIATIVE CARE:  Alleviation of Pain  Outcome: Progressing Towards Goal     Problem: Pneumonia: Day 3  Goal: Activity/Safety  Outcome: Progressing Towards Goal  Goal: Diagnostic Test/Procedures  Outcome: Progressing Towards Goal  Goal: Nutrition/Diet  Outcome: Progressing Towards Goal  Goal: Medications  Outcome: Progressing Towards Goal  Goal: Treatments/Interventions/Procedures  Outcome: Progressing Towards Goal  Goal: Psychosocial  Outcome: Progressing Towards Goal  Goal: *Oxygen saturation within defined limits  Outcome: Progressing Towards Goal  Goal: *Hemodynamically stable  Outcome: Progressing Towards Goal  Goal: *Demonstrates progressive activity  Outcome: Progressing Towards Goal  Goal:  *Tolerating diet  Outcome: Progressing Towards Goal  Goal: *Optimal pain control at patient's stated goal  Outcome: Progressing Towards Goal

## 2021-04-15 NOTE — Progress Notes (Signed)
Problem: Falls - Risk of  Goal: *Absence of Falls  Description: Document Caleb Robinson Fall Risk and appropriate interventions in the flowsheet.  Outcome: Progressing Towards Goal  Note: Fall Risk Interventions:            Medication Interventions: Assess postural VS orthostatic hypotension,Bed/chair exit alarm,Patient to call before getting OOB                   Problem: Patient Education: Go to Patient Education Activity  Goal: Patient/Family Education  Outcome: Progressing Towards Goal     Problem: Tissue Perfusion - Cardiopulmonary, Altered  Goal: *Optimize tissue perfusion  Outcome: Progressing Towards Goal  Goal: *Absence of hypoxia  Outcome: Progressing Towards Goal     Problem: Patient Education: Go to Patient Education Activity  Goal: Patient/Family Education  Outcome: Progressing Towards Goal     Problem: Risk for Spread of Infection  Goal: Prevent transmission of infectious organism to others  Description: Prevent the transmission of infectious organisms to other patients, staff members, and visitors.  Outcome: Progressing Towards Goal     Problem: Patient Education:  Go to Education Activity  Goal: Patient/Family Education  Outcome: Progressing Towards Goal     Problem: Pain  Goal: *Control of Pain  Outcome: Progressing Towards Goal  Goal: *PALLIATIVE CARE:  Alleviation of Pain  Outcome: Progressing Towards Goal

## 2021-04-15 NOTE — Progress Notes (Signed)
Received pt from RN [facility]) in stable condition. Pt in bed resting quietly. Resp even & unlabored on room air; no acute signs of distress noted. Bed low & locked; call light in reach; no needs voiced.

## 2021-04-15 NOTE — Progress Notes (Signed)
Progress Notes by Elmer Ramp, MD at 04/15/21 1002                Author: Elmer Ramp, MD  Service: Internal Medicine  Author Type: Physician       Filed: 04/15/21 1027  Date of Service: 04/15/21 1002  Status: Signed          Editor: Elmer Ramp, MD (Physician)                           Hospitalist Progress Note     Admit Date:  04/13/2021  5:25 PM    Name:  Caleb Robinson    Age:  70 y.o.   Sex:  male   DOB:  [birth year]    MRN:  [id number]    Room:  519/01      Presenting Complaint: Shortness of Breath      Reason(s) for Admission: Acute respiratory failure with hypoxia [facility]) [J96.01]         Hospital Course & Interval History:     Caleb Robinson is a 69 y.o. male with medical history of tongue cancer recently completed radiation, severe COPD, tobacco use who presented  with fever, cough, SOB associated with nausea and vomiting of 1 day duration prompting him to come to the ER.  He reported ongoing tobacco use but denies using oxygen supplementation at home.  Reported compliance to his home inhalers.  He follows  [facility] oncology and recently completed radiation for his tongue cancer.  Stated that feeling well until past day.  Denies chest pain, abdominal pain, diarrhea, constipation.   ??   At baseline, he only receives liquid in syringe by mouth but all medications and nutritions through his PEG.  He has a PICC line and gets a liter of IVF every other day to maintain hydration.   ??   In ED, O2 sat 85% on RA and was placed on 3L O2 NC.  VS otherwise stable.  CBC in CMP largely unremarkable.  Rapid COVID-19 negative.  Influenza negative.  Pro-Cal 0.69. CXR shows right perihilar opacity similar to previous imaging that could  be scarring; underlying malignancy is considered less likely but not excluded.  Given DuoNeb, 2L boluses, and Solu-Medrol in ED.        Admitted with PNA.  CT chest reviewed, remarkable for RLL pneumonia likely aspiration from secretions       Subjective/24hr Events (04/15/21 ):   Pt feeling better today.  He doesn't like being cooped up in the hospital, getting a little anxious and wandering halls in wheelchair.  SOB and cough improved.  No fevers, CP        Assessment & Plan:          Acute respiratory failure with hypoxia (HCC)    -Charted O2 Flow Rate (L/min): 1 l/min.  Wean as able.  May need home o2   -cont nebs        Anemia   -hb worse today   -recheck CBC in AM   -check iron studies        Tongue cancer (HCC)    -cont chronic pain medication oxy $RemoveBefore'10mg'AgHhQKAvTgnLt$  prn    -getting XRT and surgery eval as outpatient        Bacterial pneumonia    -cont unasyn.  Doxy to cover MRSA given the positive nasal swab.          COPD with  acute lower respiratory infection ([facility])   -switch to prednisolone (via PEG) today        Severe protein-calorie malnutrition ([place])    -RD consulted for TF        Hyperlipidemia    -Controlled. Continue home meds        Anxiety and depression    -Controlled. Continue home meds        Discharge Planning:     -home 1-2 days when oxygenation improved      Diet:  DIET NPO   ADULT TUBE FEEDING PEG; Standard with Fiber; Delivery Method: Bolus; Bolus Frequency: 5 Times Daily; Bolus Volume (mL): 237; Bolus Method of Infusion: Syringe Push; Water Flush  Volume (mL): 85; Water Flush Frequency: Before and after each bolus   DVT PPx: lovenox   Code status: DNR         Hospital Problems  as of 04/15/2021  Date Reviewed:  04/13/2021                         Codes  Class  Noted - Resolved  POA              Severe protein-calorie malnutrition ([place])  ICD-10-CM: E43   ICD-9-CM: 643    04/14/2021 - Present  Yes                        Acute respiratory failure with hypoxia ([place])  ICD-10-CM: J96.01   ICD-9-CM: 518.81    04/13/2021 - Present  Yes                        * (Principal) Aspiration pneumonia ([place])  ICD-10-CM: J69.0   ICD-9-CM: 507.0    04/13/2021 - Present  Yes                        COPD with acute lower respiratory infection ([place])  ICD-10-CM: J44.0    ICD-9-CM: 496, 519.8    04/13/2021 - Present  Yes                        Hyperlipidemia (Chronic)  ICD-10-CM: E78.5   ICD-9-CM: 272.4    04/13/2021 - Present  Yes                        Anxiety and depression (Chronic)  ICD-10-CM: F41.9, F32.A   ICD-9-CM: 300.00, 311    04/13/2021 - Present  Yes                        Tongue cancer ([place]) (Chronic)  ICD-10-CM: C02.9   ICD-9-CM: 141.9    06/27/2020 - Present  Yes                            Objective:        Patient Vitals for the past 24 hrs:            Temp  Pulse  Resp  BP  SpO2            04/15/21 0728  --  --  --  --  98 %            04/15/21 0726  98.2 ??F (36.8 ??C)  71  18  (!) 115/56  97 %  04/15/21 0404  98 ??F (36.7 ??C)  70  18  (!) 107/48  92 %     04/15/21 0136  --  --  --  --  90 %     04/15/21 0011  97.9 ??F (36.6 ??C)  75  19  (!) 115/55  95 %     04/14/21 1940  98.7 ??F (37.1 ??C)  76  18  (!) 111/47  94 %     04/14/21 1838  --  --  --  --  90 %     04/14/21 1459  98.2 ??F (36.8 ??C)  72  18  97/61  91 %     04/14/21 1329  --  --  --  --  90 %            04/14/21 1056  98.7 ??F (37.1 ??C)  67  16  90/72  90 %        Oxygen Therapy   O2 Sat (%): 98 % (04/15/21 0728)   Pulse via Oximetry: 65 beats per minute (04/15/21 0726)   O2 Device: Nasal cannula (04/15/21 0726)   O2 Flow Rate (L/min): 1 l/min (04/15/21 0726)   FIO2 (%): 32 % (04/13/21 1842)      Estimated body mass index is 14.5 kg/m?? as calculated from the following:     Height as of this encounter: '5\' 4"'$  (1.626 m).     Weight as of this encounter: 38.3 kg (84 lb 8 oz).      Intake/Output Summary (Last 24 hours) at 04/15/2021 1002   Last data filed at 04/15/2021 0828     Gross per 24 hour        Intake  1280 ml        Output  --        Net  1280 ml             Physical Exam:    Blood pressure (!) 115/56, pulse 71, temperature 98.2 ??F (36.8 ??C), resp.  rate 18, height '5\' 4"'$  (1.626 m), weight 38.3 kg (84 lb 8 oz), SpO2 98 %.   General:    malnourished.  No overt distress   Head:  Normocephalic, atraumatic    Eyes:  Sclerae appear normal.  Pupils equally round.   ENT:  Nares appear normal, no drainage.  Moist oral mucosa   Neck:  No restricted ROM.  Trachea midline    CV:   RRR.    No jugular venous distension.   Lungs:   Diminished R base, no wheezing.  Respirations even, unlabored   Abdomen:   nondistended   Extremities: No cyanosis or clubbing.  No edema   Skin:     No rashes and normal coloration.   Warm and dry.     Neuro:  CN II-XII grossly intact.  Sensation intact.  A&Ox3   Psych:  Normal mood and affect.        I have reviewed ordered lab tests and independently visualized imaging below:      Recent Labs:     Recent Results (from the past 48 hour(s))     CULTURE, BLOOD          Collection Time: 04/13/21  5:25 PM       Specimen: Blood         Result  Value  Ref Range            Special Requests:  LEFT   Antecubital  Culture result:  NO GROWTH AFTER 13 HOURS          LACTIC ACID          Collection Time: 04/13/21  5:25 PM         Result  Value  Ref Range            Lactic acid  1.3  0.4 - 2.0 MMOL/L       CBC WITH AUTOMATED DIFF          Collection Time: 04/13/21  5:25 PM         Result  Value  Ref Range            WBC  8.9  4.3 - 11.1 K/uL       RBC  3.89 (L)  4.23 - 5.6 M/uL       HGB  12.4 (L)  13.6 - 17.2 g/dL       HCT  39.0 (L)  41.1 - 50.3 %       MCV  100.3 (H)  79.6 - 97.8 FL       MCH  31.9  26.1 - 32.9 PG       MCHC  31.8  31.4 - 35.0 g/dL       RDW  16.8 (H)  11.9 - 14.6 %       PLATELET  225  150 - 450 K/uL       MPV  11.7  9.4 - 12.3 FL       ABSOLUTE NRBC  0.00  0.0 - 0.2 K/uL       DF  AUTOMATED          NEUTROPHILS  85 (H)  43 - 78 %       LYMPHOCYTES  5 (L)  13 - 44 %       MONOCYTES  9  4.0 - 12.0 %       EOSINOPHILS  0 (L)  0.5 - 7.8 %       BASOPHILS  0  0.0 - 2.0 %       IMMATURE GRANULOCYTES  0  0.0 - 5.0 %       ABS. NEUTROPHILS  7.6  1.7 - 8.2 K/UL       ABS. LYMPHOCYTES  0.5  0.5 - 4.6 K/UL       ABS. MONOCYTES  0.8  0.1 - 1.3 K/UL       ABS. EOSINOPHILS  0.0  0.0 - 0.8 K/UL        ABS. BASOPHILS  0.0  0.0 - 0.2 K/UL       ABS. IMM. GRANS.  0.0  0.0 - 0.5 K/UL       METABOLIC PANEL, COMPREHENSIVE          Collection Time: 04/13/21  5:25 PM         Result  Value  Ref Range            Sodium  141  138 - 145 mmol/L       Potassium  4.1  3.5 - 5.1 mmol/L       Chloride  105  98 - 107 mmol/L       CO2  29  21 - 32 mmol/L       Anion gap  7  7 - 16 mmol/L       Glucose  109 (H)  65 - 100 mg/dL       BUN  29 (H)  8 - 23 MG/DL       Creatinine  0.90  0.8 - 1.5 MG/DL       GFR est AA  >60  >60 ml/min/1.66m       GFR est non-AA  >60  >60 ml/min/1.729m      Calcium  9.6  8.3 - 10.4 MG/DL       Bilirubin, total  0.4  0.2 - 1.1 MG/DL       ALT (SGPT)  22  12 - 65 U/L       AST (SGOT)  16  15 - 37 U/L       Alk. phosphatase  96  50 - 136 U/L       Protein, total  7.4  6.3 - 8.2 g/dL       Albumin  3.6  3.2 - 4.6 g/dL       Globulin  3.8 (H)  2.3 - 3.5 g/dL       A-G Ratio  0.9 (L)  1.2 - 3.5         PROCALCITONIN          Collection Time: 04/13/21  5:25 PM         Result  Value  Ref Range            Procalcitonin  0.67 (H)  0.00 - 0.49 ng/mL       EKG, 12 LEAD, INITIAL          Collection Time: 04/13/21  5:40 PM         Result  Value  Ref Range            Ventricular Rate  89  BPM       Atrial Rate  83  BPM       P-R Interval  129  ms       QRS Duration  96  ms       Q-T Interval  378  ms       QTC Calculation (Bezet)  460  ms       Calculated P Axis  66  degrees       Calculated R Axis  -126  degrees       Calculated T Axis  58  degrees       Diagnosis                 Sinus rhythm   Atrial premature complexes   Probable right ventricular hypertrophy      Confirmed by MEDriscilla [id number] 04/14/2021 7:12:57 AM          COVID-19 RAPID TEST          Collection Time: 04/13/21  6:26 PM         Result  Value  Ref Range            Specimen source  Nasopharyngeal          COVID-19 rapid test  Not detected  NOTD         INFLUENZA A & B AG (RAPID TEST)          Collection Time: 04/13/21  6:44 PM          Result  Value  Ref Range            Influenza A Ag  Negative  NEG         Influenza B Ag  Negative  NEG  Source  Nasopharyngeal          CULTURE, BLOOD          Collection Time: 04/13/21  7:47 PM       Specimen: Blood         Result  Value  Ref Range            Special Requests:  LEFT   Antecubital             Culture result:  NO GROWTH AFTER 10 HOURS          LACTIC ACID          Collection Time: 04/13/21  7:47 PM         Result  Value  Ref Range            Lactic acid  1.6  0.4 - 2.0 MMOL/L       D DIMER          Collection Time: 04/13/21 10:41 PM         Result  Value  Ref Range            D DIMER  1.27 (H)  <0.56 ug/ml(FEU)       MSSA/MRSA SC BY PCR, NASAL SWAB          Collection Time: 04/14/21  1:09 AM       Specimen: Nasal swab         Result  Value  Ref Range            Special Requests:  NO SPECIAL REQUESTS          Culture result:  (A)                MRSA target DNA detected, SA target DNA detected.       A positive test result does not necessarily indicate the presence of viable organisms. It is  however, presumptive for the presence of MRSA or SA.            Culture result:                  RESULTS VERIFIED, PHONED TO AND READ BACK BY   THIEN NGUYEN RN @ 6295 CAL          METABOLIC PANEL, BASIC          Collection Time: 04/14/21  3:12 AM         Result  Value  Ref Range            Sodium  141  138 - 145 mmol/L       Potassium  4.2  3.5 - 5.1 mmol/L       Chloride  109 (H)  98 - 107 mmol/L       CO2  28  21 - 32 mmol/L       Anion gap  4 (L)  7 - 16 mmol/L       Glucose  156 (H)  65 - 100 mg/dL       BUN  29 (H)  8 - 23 MG/DL       Creatinine  0.80  0.8 - 1.5 MG/DL       GFR est AA  >60  >60 ml/min/1.12m       GFR est non-AA  >60  >60 ml/min/1.770m      Calcium  8.4  8.3 - 10.4 MG/DL       CBC W/O DIFF  Collection Time: 04/14/21  3:12 AM         Result  Value  Ref Range            WBC  9.3  4.3 - 11.1 K/uL       RBC  3.14 (L)  4.23 - 5.6 M/uL       HGB  10.1 (L)  13.6 - 17.2 g/dL        HCT  31.7 (L)  41.1 - 50.3 %       MCV  101.0 (H)  79.6 - 97.8 FL       MCH  32.2  26.1 - 32.9 PG       MCHC  31.9  31.4 - 35.0 g/dL       RDW  16.8 (H)  11.9 - 14.6 %       PLATELET  174  150 - 450 K/uL       MPV  12.0  9.4 - 12.3 FL       ABSOLUTE NRBC  0.00  0.0 - 0.2 K/uL       URINE MICROSCOPIC          Collection Time: 04/14/21  6:31 AM         Result  Value  Ref Range            WBC  0-3  0 /hpf       RBC  3-5  0 /hpf       Epithelial cells  0-3  0 /hpf       Bacteria  TRACE  0 /hpf       Casts  0  0 /lpf       Crystals, urine  0  0 /LPF       Mucus  1+ (H)  0 /lpf       Other observations  RESULTS VERIFIED MANUALLY          PLEASE READ & DOCUMENT PPD TEST IN 24 HRS          Collection Time: 04/15/21 12:14 AM         Result  Value  Ref Range            PPD  Negative  Negative       mm Induration  0  0 - 5 mm       CBC W/O DIFF          Collection Time: 04/15/21  3:15 AM         Result  Value  Ref Range            WBC  8.3  4.3 - 11.1 K/uL       RBC  2.80 (L)  4.23 - 5.6 M/uL       HGB  9.0 (L)  13.6 - 17.2 g/dL       HCT  28.2 (L)  41.1 - 50.3 %       MCV  100.7 (H)  79.6 - 97.8 FL       MCH  32.1  26.1 - 32.9 PG       MCHC  31.9  31.4 - 35.0 g/dL       RDW  17.2 (H)  11.9 - 14.6 %       PLATELET  166  150 - 450 K/uL       MPV  12.4 (H)  9.4 - 12.3 FL       ABSOLUTE  NRBC  0.00  0.0 - 0.2 K/uL       METABOLIC PANEL, BASIC          Collection Time: 04/15/21  3:15 AM         Result  Value  Ref Range            Sodium  143  138 - 145 mmol/L       Potassium  3.9  3.5 - 5.1 mmol/L       Chloride  111 (H)  98 - 107 mmol/L       CO2  27  21 - 32 mmol/L       Anion gap  5 (L)  7 - 16 mmol/L       Glucose  169 (H)  65 - 100 mg/dL       BUN  30 (H)  8 - 23 MG/DL       Creatinine  0.70 (L)  0.8 - 1.5 MG/DL       GFR est AA  >60  >60 ml/min/1.54m       GFR est non-AA  >60  >60 ml/min/1.757m      Calcium  8.5  8.3 - 10.4 MG/DL       MAGNESIUM          Collection Time: 04/15/21  3:15 AM         Result  Value  Ref Range             Magnesium  2.0  1.8 - 2.4 mg/dL       PHOSPHORUS          Collection Time: 04/15/21  3:15 AM         Result  Value  Ref Range            Phosphorus  1.5 (L)  2.3 - 3.7 MG/DL             All Micro Results               Procedure  Component  Value  Units  Date/Time           BLOOD CULTURE [[id number]]Collected: 04/13/21 1725            Order Status: Completed  Specimen: Blood  Updated: 04/14/21 0650                Special Requests:  --                  LEFT   Antecubital                   Culture result:  NO GROWTH AFTER 13 HOURS                BLOOD CULTURE [[id number]]Collected: 04/13/21 1947            Order Status: Completed  Specimen: Blood  Updated: 04/14/21 0650                Special Requests:  --                  LEFT   Antecubital                   Culture result:  NO GROWTH AFTER 10 HOURS                MSSA/MRSA SC BY PCR, NASAL SWAB [[id number]](Abnormal)  Collected:  04/14/21 0109            Order Status: Completed  Specimen: Nasal swab  Updated: 04/14/21 0244                Special Requests:  NO SPECIAL REQUESTS                    Culture result:                 MRSA target DNA detected, SA target DNA detected.       A positive test result does not necessarily indicate the presence of viable organisms.  It is however, presumptive for the presence of MRSA or SA.                                            RESULTS VERIFIED, PHONED TO AND READ BACK BY   Emelia Loron RN @ 2956 CAL              CULTURE, RESPIRATORY/SPUTUM/BRONCH Verdie Drown [[id number]]              Order Status: Sent  Specimen: Sputum             INFLUENZA A & B AG (RAPID TEST) [[id number]]  Collected: 04/13/21 1844            Order Status: Completed  Specimen: Nasal washing  Updated: 04/13/21 1904                Influenza A Ag  Negative                  Comment:  NEGATIVE FOR THE PRESENCE OF INFLUENZA A ANTIGEN   INFECTION DUE TO INFLUENZA A CANNOT BE RULED OUT.   BECAUSE THE ANTIGEN PRESENT IN THE SAMPLE MAY BE BELOW   THE  DETECTION LIMIT OF THE TEST.   A NEGATIVE TEST IS PRESUMPTIVE AND IT IS RECOMMENDED THAT THESE RESULTS BE CONFIRMED BY VIRAL CULTURE OR AN FDA-CLEARED INFLUENZA A AND B MOLECULAR ASSAY.                             Influenza B Ag  Negative                  Comment:  NEGATIVE FOR THE PRESENCE OF INFLUENZA B ANTIGEN   INFECTION DUE TO INFLUENZA B CANNOT BE RULED OUT.   BECAUSE THE ANTIGEN PRESENT IN THE SAMPLE MAY BE BELOW   THE DETECTION LIMIT OF THE TEST.   A NEGATIVE TEST IS PRESUMPTIVE AND IT IS RECOMMENDED THAT THESE RESULTS BE CONFIRMED BY VIRAL CULTURE OR AN FDA-CLEARED INFLUENZA A AND B MOLECULAR ASSAY.                             Source  Nasopharyngeal                COVID-19 RAPID TEST [[id number]]  Collected: 04/13/21 1826            Order Status: Completed  Specimen: Nasopharyngeal  Updated: 04/13/21 1859                Specimen source  Nasopharyngeal              COVID-19 rapid test  Not detected                  Comment:        The specimen is NEGATIVE for SARS-CoV-2, the novel coronavirus associated with COVID-19.   A negative result does not rule out COVID-19.         This test has been authorized by the FDA under an Emergency Use Authorization (EUA) for use by authorized laboratories.          Fact sheet for Healthcare Providers: LittleDVDs.dk   Fact sheet for Patients: SatelliteRebate.it         Methodology: Isothermal Nucleic Acid Amplification                        INFLUENZA A & B AG (RAPID TEST) [[id number]]  Collected: 04/13/21 1826            Order Status: Canceled  Specimen: Nasopharyngeal                  Other Studies:   No results found.      Current Meds:     Current Facility-Administered Medications          Medication  Dose  Route  Frequency           ?  ampicillin-sulbactam (UNASYN) 3 g in 0.9% sodium chloride (MBP/ADV) 100 mL MBP   3 g  IntraVENous  Q6H     ?  oxyCODONE (ROXICODONE) 5 mg/5 mL oral solution 10 mg   10 mg  Oral  Q4H PRN      ?  doxycycline (VIBRAMYCIN) 100 mg in 0.9% sodium chloride (MBP/ADV) 100 mL MBP   100 mg  IntraVENous  Q12H     ?  HYDROcodone-homatropine (HYCODAN) 5-1.5 mg/5 mL (5 mL) oral solution 5 mL   5 mL  Oral  Q4H PRN     ?  NUTRITIONAL SUPPORT ELECTROLYTE PRN ORDERS     Does Not Apply  PRN     ?  morphine injection 2 mg   2 mg  IntraVENous  Q4H PRN     ?  sodium chloride (NS) flush 5-10 mL   5-10 mL  IntraVENous  Q8H     ?  sodium chloride (NS) flush 5-10 mL   5-10 mL  IntraVENous  PRN     ?  fluticasone propionate (FLONASE) 50 mcg/actuation nasal spray 2 Spray   2 Spray  Both Nostrils  DAILY     ?  albuterol-ipratropium (DUO-NEB) 2.5 MG-0.5 MG/3 ML   3 mL  Nebulization  Q6H RT     ?  gabapentin (NEURONTIN) capsule 200 mg   200 mg  Oral  TID PRN     ?  sertraline (ZOLOFT) tablet 50 mg   50 mg  Per G Tube  DAILY     ?  sodium chloride (NS) flush 5-40 mL   5-40 mL  IntraVENous  Q8H     ?  sodium chloride (NS) flush 5-40 mL   5-40 mL  IntraVENous  PRN     ?  acetaminophen (TYLENOL) tablet 650 mg   650 mg  Oral  Q6H PRN          Or           ?  acetaminophen (TYLENOL) suppository 650 mg   650 mg  Rectal  Q6H PRN     ?  polyethylene glycol (MIRALAX) packet 17 g  17 g  Oral  DAILY PRN     ?  ondansetron (ZOFRAN ODT) tablet 4 mg   4 mg  Oral  Q8H PRN          Or           ?  ondansetron (ZOFRAN) injection 4 mg   4 mg  IntraVENous  Q6H PRN     ?  enoxaparin (LOVENOX) injection 30 mg   30 mg  SubCUTAneous  DAILY     ?  guaiFENesin-dextromethorphan (ROBITUSSIN DM) 100-10 mg/5 mL syrup 10 mL   10 mL  Oral  Q6H PRN     ?  methylPREDNISolone (PF) (SOLU-MEDROL) injection 40 mg   40 mg  IntraVENous  Q12H     ?  nicotine (NICODERM CQ) 14 mg/24 hr patch 1 Patch   1 Patch  TransDERmal  DAILY PRN     ?  cholecalciferol (VITAMIN D3) (1000 Units /25 mcg) tablet 1,000 Units   1,000 Units  PEG Tube  DAILY     ?  guaiFENesin (ROBITUSSIN) 100 mg/5 mL oral liquid 400 mg   400 mg  Per G Tube  BID     ?  rosuvastatin (CRESTOR) tablet 10 mg   10 mg   Per G Tube  QHS     ?  traZODone (DESYREL) tablet 50 mg   50 mg  Per G Tube  QHS     ?  LORazepam (ATIVAN) tablet 0.5 mg   0.5 mg  Per G Tube  DAILY PRN     ?  albuterol (PROVENTIL VENTOLIN) nebulizer solution 2.5 mg   2.5 mg  Nebulization  Q6H PRN           ?  famotidine (PEPCID) tablet 20 mg   20 mg  Per G Tube  DAILY           Signed:   Elmer Ramp, MD      Part of this note may have been written by using a voice dictation software.  The note has been proof read but may still contain some grammatical/other typographical errors.

## 2021-04-16 LAB — PHOSPHORUS
Phosphorus: 1.9 MG/DL — ABNORMAL LOW (ref 2.3–3.7)
Phosphorus: 1.9 MG/DL — ABNORMAL LOW (ref 2.3–3.7)

## 2021-04-16 LAB — BASIC METABOLIC PANEL
Anion Gap: 2 mmol/L — ABNORMAL LOW (ref 7–16)
BUN: 27 MG/DL — ABNORMAL HIGH (ref 8–23)
CO2: 29 mmol/L (ref 21–32)
Calcium: 8.9 MG/DL (ref 8.3–10.4)
Chloride: 111 mmol/L — ABNORMAL HIGH (ref 98–107)
Creatinine: 0.7 MG/DL — ABNORMAL LOW (ref 0.8–1.5)
EGFR IF NonAfrican American: >60 mL/min/{1.73_m2} (ref >60)
GFR African American: >60 mL/min/{1.73_m2} (ref >60)
Glucose: 106 mg/dL — ABNORMAL HIGH (ref 65–100)
Potassium: 3.7 mmol/L (ref 3.5–5.1)
Sodium: 142 mmol/L (ref 136–145)

## 2021-04-16 LAB — POC URINE MACROSCOPIC
BILIRUBIN, BILUPC: NEGATIVE
Bilirubin (POC): NEGATIVE
GLUCOSE, GLUUPC: NEGATIVE mg/dL
Glucose, urine (POC): NEGATIVE mg/dL
LEUKOCYTE ESTERASE, LEUKPC: NEGATIVE
Leukocyte esterase (POC): NEGATIVE
NITRITE, NITUPC: NEGATIVE
Nitrite (POC): NEGATIVE
PH, PHUPOC: 5.5 (ref 5.0–9.0)
PROTEIN, PROUPC: NEGATIVE mg/dL
Protein (POC): NEGATIVE mg/dL
SPECIFIC GRAVITY, SGUPOC: 1.025 — ABNORMAL HIGH (ref 1.001–1.023)
Spec. gravity (POC): 1.025 — ABNORMAL HIGH (ref 1.001–1.023)
Urobilinogen (POC): 0.2 EU/dL (ref 0.2–1.0)
Urobilinogen, POC: 0.2 EU/dL (ref 0.2–1.0)
pH, urine  (POC): 5.5 (ref 5.0–9.0)

## 2021-04-16 LAB — TRANSFERRIN SATURATION
Iron: 19 ug/dL — ABNORMAL LOW (ref 35–150)
Iron: 19 ug/dL — ABNORMAL LOW (ref 35–150)
TIBC: 285 ug/dL (ref 250–450)
TIBC: 285 ug/dL (ref 250–450)
TRANSFERRIN SATURATION: 7 % — ABNORMAL LOW (ref >20)
Transferrin Saturation: 7 % — ABNORMAL LOW (ref >20)

## 2021-04-16 LAB — CBC
Hematocrit: 29.2 % — ABNORMAL LOW (ref 41.1–50.3)
Hemoglobin: 9.2 g/dL — ABNORMAL LOW (ref 13.6–17.2)
MCH: 32.2 PG (ref 26.1–32.9)
MCHC: 31.5 g/dL (ref 31.4–35.0)
MCV: 102.1 FL — ABNORMAL HIGH (ref 79.6–97.8)
MPV: 11.8 FL (ref 9.4–12.3)
NRBC Absolute: 0 10*3/uL (ref 0.0–0.2)
Platelets: 175 10*3/uL (ref 150–450)
RBC: 2.86 M/uL — ABNORMAL LOW (ref 4.23–5.6)
RDW: 17.2 % — ABNORMAL HIGH (ref 11.9–14.6)
WBC: 9 10*3/uL (ref 4.3–11.1)

## 2021-04-16 LAB — FERRITIN
Ferritin: 58 NG/ML (ref 8–388)
Ferritin: 58 NG/ML (ref 8–388)

## 2021-04-16 LAB — MAGNESIUM
Magnesium: 2 mg/dL (ref 1.8–2.4)
Magnesium: 2 mg/dL (ref 1.8–2.4)

## 2021-04-16 LAB — CBC W/O DIFF
ABSOLUTE NRBC: 0 10*3/uL (ref 0.0–0.2)
HCT: 29.2 % — ABNORMAL LOW (ref 41.1–50.3)
HGB: 9.2 g/dL — ABNORMAL LOW (ref 13.6–17.2)
MCH: 32.2 PG (ref 26.1–32.9)
MCHC: 31.5 g/dL (ref 31.4–35.0)
MCV: 102.1 FL — ABNORMAL HIGH (ref 79.6–97.8)
MPV: 11.8 FL (ref 9.4–12.3)
PLATELET: 175 10*3/uL (ref 150–450)
RBC: 2.86 M/uL — ABNORMAL LOW (ref 4.23–5.6)
RDW: 17.2 % — ABNORMAL HIGH (ref 11.9–14.6)
WBC: 9 10*3/uL (ref 4.3–11.1)

## 2021-04-16 LAB — METABOLIC PANEL, BASIC
Anion gap: 2 mmol/L — ABNORMAL LOW (ref 7–16)
BUN: 27 MG/DL — ABNORMAL HIGH (ref 8–23)
CO2: 29 mmol/L (ref 21–32)
Calcium: 8.9 MG/DL (ref 8.3–10.4)
Chloride: 111 mmol/L — ABNORMAL HIGH (ref 98–107)
Creatinine: 0.7 MG/DL — ABNORMAL LOW (ref 0.8–1.5)
GFR est AA: >60 mL/min/{1.73_m2} (ref >60)
GFR est non-AA: >60 mL/min/{1.73_m2} (ref >60)
Glucose: 106 mg/dL — ABNORMAL HIGH (ref 65–100)
Potassium: 3.7 mmol/L (ref 3.5–5.1)
Sodium: 142 mmol/L (ref 136–145)

## 2021-04-16 LAB — PLEASE READ & DOCUMENT PPD TEST IN 48 HRS
PPD: NEGATIVE Negative
mm Induration: 0 mm (ref 0–5)

## 2021-04-16 MED ORDER — PREDNISOLONE 15 MG/5 ML ORAL SOLN
15 mg/5 mL | ORAL | 0 refills | Status: AC
Start: 2021-04-16 — End: ?

## 2021-04-16 MED ORDER — AMOXICILLIN-POT CLAVULANATE 400 MG-57 MG/5 ML ORAL SUSP
400-57 mg/5 mL | Freq: Two times a day (BID) | ORAL | 0 refills | Status: AC
Start: 2021-04-16 — End: 2021-04-21

## 2021-04-16 MED FILL — PREDNISOLONE 15 MG/5 ML ORAL SOLN: 15 mg/5 mL | ORAL | Qty: 15

## 2021-04-16 MED FILL — GUAIFENESIN 100 MG/5 ML ORAL LIQUID: 100 mg/5 mL | ORAL | Qty: 20

## 2021-04-16 MED FILL — AMPICILLIN-SULBACTAM 3 GRAM SOLUTION FOR INJECTION: 3 gram | INTRAMUSCULAR | Qty: 3

## 2021-04-16 MED FILL — DEXTROMETHORPHAN-GUAIFENESIN 10 MG-100 MG/5 ML SYRUP: 100-10 mg/5 mL | ORAL | Qty: 10

## 2021-04-16 MED FILL — MORPHINE 2 MG/ML INJECTION: 2 mg/mL | INTRAMUSCULAR | Qty: 1

## 2021-04-16 MED FILL — PREDNISOLONE 15 MG/5 ML ORAL SOLN: 15 mg/5 mL | ORAL | Qty: 13.33

## 2021-04-16 MED FILL — DOXY-100 100 MG INTRAVENOUS SOLUTION: 100 mg | INTRAVENOUS | Qty: 100

## 2021-04-16 MED FILL — IPRATROPIUM-ALBUTEROL 2.5 MG-0.5 MG/3 ML NEB SOLUTION: 2.5 mg-0.5 mg/3 ml | RESPIRATORY_TRACT | Qty: 3

## 2021-04-16 NOTE — Progress Notes (Signed)
Discharge instructions & prescription information given to pt. Verbalized understanding. Opportunity for questions provided. Instructed pt to call when ready to be taken down.

## 2021-04-16 NOTE — Progress Notes (Signed)
Problem: Falls - Risk of  Goal: *Absence of Falls  Description: Document Caleb Robinson Fall Risk and appropriate interventions in the flowsheet.  Outcome: Resolved/Met     Problem: Patient Education: Go to Patient Education Activity  Goal: Patient/Family Education  Outcome: Resolved/Met     Problem: Tissue Perfusion - Cardiopulmonary, Altered  Goal: *Optimize tissue perfusion  Outcome: Resolved/Met  Goal: *Absence of hypoxia  Outcome: Resolved/Met     Problem: Patient Education: Go to Patient Education Activity  Goal: Patient/Family Education  Outcome: Resolved/Met     Problem: Risk for Spread of Infection  Goal: Prevent transmission of infectious organism to others  Description: Prevent the transmission of infectious organisms to other patients, staff members, and visitors.  Outcome: Resolved/Met     Problem: Patient Education:  Go to Education Activity  Goal: Patient/Family Education  Outcome: Resolved/Met     Problem: Pain  Goal: *Control of Pain  Outcome: Resolved/Met  Goal: *PALLIATIVE CARE:  Alleviation of Pain  Outcome: Resolved/Met     Problem: Pneumonia: Day 3  Goal: Activity/Safety  Outcome: Resolved/Met  Goal: Diagnostic Test/Procedures  Outcome: Resolved/Met  Goal: Nutrition/Diet  Outcome: Resolved/Met  Goal: Medications  Outcome: Resolved/Met  Goal: Treatments/Interventions/Procedures  Outcome: Resolved/Met  Goal: Psychosocial  Outcome: Resolved/Met  Goal: *Oxygen saturation within defined limits  Outcome: Resolved/Met  Goal: *Hemodynamically stable  Outcome: Resolved/Met  Goal: *Demonstrates progressive activity  Outcome: Resolved/Met  Goal: *Tolerating diet  Outcome: Resolved/Met  Goal: *Optimal pain control at patient's stated goal  Outcome: Resolved/Met     Problem: Patient Education: Go to Patient Education Activity  Goal: Patient/Family Education  Outcome: Resolved/Met

## 2021-04-16 NOTE — Discharge Summary (Signed)
Discharge Summary by Elmer Ramp, MD at 04/16/21 0820                Author: Elmer Ramp, MD  Service: Internal Medicine  Author Type: Physician       Filed: 04/16/21 0822  Date of Service: 04/16/21 0820  Status: Signed          Editor: Elmer Ramp, MD (Physician)                      Hospitalist Discharge Summary     Admit Date:  04/13/2021  5:25 PM    DC Note date: 04/16/2021   Name:  Caleb Robinson    Age:  70 y.o.   Sex:  male   DOB:  06-Feb-1951    MRN:  [id number]    Room:  519/01   PCP:  Caleb Robinsons, MD      Presenting Complaint: Shortness of Breath      Initial Admission Diagnosis: Acute respiratory failure with hypoxia ([place]) [J96.01]       Problem List for this Hospitalization:      Hospital Problems  as of 04/16/2021  Date Reviewed:  04/13/2021                         Codes  Class  Noted - Resolved  POA              Severe protein-calorie malnutrition ([place]) (Chronic)  ICD-10-CM: Z30   ICD-9-CM: 262    04/14/2021 - Present  Yes                        Acute respiratory failure with hypoxia ([place])  ICD-10-CM: J96.01   ICD-9-CM: 518.81    04/13/2021 - Present  Yes                        * (Principal) Aspiration pneumonia ([place])  ICD-10-CM: J69.0   ICD-9-CM: 507.0    04/13/2021 - Present  Yes                        COPD with acute lower respiratory infection ([place])  ICD-10-CM: J44.0   ICD-9-CM: 496, 519.8    04/13/2021 - Present  Yes                        Hyperlipidemia (Chronic)  ICD-10-CM: E78.5   ICD-9-CM: 272.4    04/13/2021 - Present  Yes                        Anxiety and depression (Chronic)  ICD-10-CM: F41.9, F32.A   ICD-9-CM: 300.00, 311    04/13/2021 - Present  Yes                        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 male with medical history of tongue cancer recently completed radiation, severe  COPD, tobacco use who presented with fever, cough, SOB associated with nausea and vomiting of 1 day duration prompting him to come to the ER.  He reported ongoing tobacco use but denies using oxygen supplementation at home.  Reported compliance to  his home inhalers.  He follows [facility] oncology and recently completed radiation for his tongue cancer.  Stated that feeling well until past day.  Denies chest pain, abdominal pain, diarrhea, constipation.   ??   At baseline, he only receives liquid in syringe by mouth but all medications and nutritions through his PEG.  He has a PICC line and gets a liter of IVF every other day to maintain hydration.   ??   In ED, O2 sat 85% on RA and was placed on 3L O2 NC.  VS otherwise stable.  CBC in CMP largely unremarkable.  Rapid COVID- 19 negative.  Influenza negative.  Pro-Cal 0.69. CXR shows right perihilar opacity similar to previous imaging that could be scarring; underlying malignancy is considered less likely but not excluded.  Given DuoNeb, 2L boluses, and Solu-Medrol in  ED. "      Hospital Course:   Admitted with PNA.  CT chest reviewed, remarkable for RLL pneumonia likely aspiration from secretions.  Started on steroids,  unasyn and doxy with improvement.  I doubt he needs the doxy.  He is improved and off oxygen.  No therapy needs.  Will continue augmentin and steroids via PEG at discharge.        Disposition: Home or Self Care   Diet: DIET NPO   ADULT TUBE FEEDING PEG; Standard with Fiber; Delivery Method: Bolus; Bolus Frequency: 5 Times Daily; Bolus Volume (mL): 237; Bolus Method of Infusion: Syringe Push; Water Flush Volume (mL): 85; Water Flush Frequency: Before and after each bolus   Code Status: DNR      Follow Up Orders:   No orders of the defined types were placed in this encounter.           Follow-up Information                Follow up With  Specialties  Details  Why  Contact Info              Caleb Robinsons, MD  [facility]  Call  As needed  [place] [address]   [phone]                   Follow up labs/diagnostics (ultimately defer to outpatient provider):   CBC, BMP      Time spent in patient discharge and coordination 32 minutes.      Plan was discussed with pt.  All questions answered.  Patient was stable at time of discharge.  Instructions given to call a physician or return  if any concerns.      Discharge Info:      Current Discharge Medication List              START taking these medications          Details        prednisoLONE (PRELONE) 15 mg/5 mL syrup  13.5 mL by Per G Tube route See Admin Instructions. Take 39ml daily for 3d, then  4ml daily for 3d, then stop   Qty: 60 mL, Refills:  0   Start date: 04/16/2021               amoxicillin-clavulanate (AUGMENTIN) 400-57 mg/5 mL suspension  5 mL by Per G Tube route two (2) times a day for 5 days.   Qty: 50 mL, Refills:  0   Start date: 04/16/2021, End date:  04/21/2021                     CONTINUE these medications which have NOT CHANGED          Details        oxyCODONE IR (ROXICODONE) 10 mg tab immediate release tablet  Take 1 Tablet by mouth every six (6) hours as needed for Pain for up to 30 days. Max Daily Amount: 40 mg.   Qty: 120 Tablet, Refills:  0   Start date: 04/13/2021, End date:  05/13/2021          Associated Diagnoses: Cancer related pain               rosuvastatin (CRESTOR) 10 mg tablet                 sertraline (Zoloft) 20 mg/mL concentrated solution  Take 2.5 mL by mouth daily. Indications: anxiousness associated with depression   Qty: 75 mL, Refills:  0          Associated Diagnoses: Reactive depression; Encounter for palliative care               chlorhexidine (PERIDEX) 0.12 % solution  10 mL 4 TIMES DAILY (route: mucous membrane)               fluticasone-umeclidinium-vilanterol (Trelegy Ellipta) 100-62.5-25 mcg inhaler  2 inhalation DAILY  (route: inhalation)               nutritional supplements (Nutren 1.5) 0.07 gram-1.5 kcal/mL liqd  250 mL EVERY 4 HOURS (route: feeding tube)               fluticasone propionate (FLONASE) 50 mcg/actuation nasal spray  2 Sprays by Both Nostrils route daily.   Qty: 1 Each, Refills:  1          Associated Diagnoses: Ear fullness, bilateral               albuterol-ipratropium (DUO-NEB) 2.5 mg-0.5 mg/3 ml nebu  3 mL by Nebulization route every six (6) hours as needed for Wheezing.   Qty: 120 Nebule, Refills:  5               budesonide (Pulmicort) 0.5 mg/2 mL nbsp  2 mL by Nebulization route two (2) times a day.   Qty: 60 Each, Refills:  11               albuterol (PROVENTIL HFA, VENTOLIN HFA, PROAIR HFA) 90 mcg/actuation inhaler  Take 2 Puffs by inhalation every six (6) hours as needed for Wheezing.   Qty: 1 Each, Refills:  11          Associated Diagnoses: Centrilobular emphysema (HCC)               traZODone (DESYREL) 50 mg tablet  Take 1 Tablet by mouth nightly.   Qty: 90 Tablet, Refills:  3               gabapentin (NEURONTIN) 100 mg capsule  Take 2 Capsules by mouth three (3) times daily  as needed for Pain (Restless leg).   Qty: 360 Capsule, Refills:  3          Associated Diagnoses: Pain in both lower extremities               ibuprofen (MOTRIN) 800 mg tablet  Take 1 tablet by mouth twice daily as needed for pain   Qty: 60 Tablet, Refills:  0          Associated Diagnoses: Chronic bilateral low back pain without sciatica               LORazepam (ATIVAN) 0.5 mg tablet  Take 1 Tablet by mouth daily. Max Daily Amount: 0.5 mg.   Qty: 30 Tablet, Refills:  0          Associated Diagnoses: Tongue cancer (HCC)               artificial saliva (MOUTH KOTE) spra  Take 1 Spray by mouth as needed (dry mouth).   Qty: 59 mL, Refills:  1          Associated Diagnoses: Xerostomia               diphenoxylate-atropine (LomotiL) 2.5-0.025 mg per tablet  Take 1 Tablet by mouth four (4) times daily as needed for Diarrhea. Max Daily  Amount: 4 Tablets.   Qty: 60 Tablet, Refills:  5          Associated Diagnoses: Tongue cancer (HCC)               multivitamin (ONE A DAY) tablet  Take 1 Tablet by mouth daily.               cholecalciferol (Vitamin D3) 25 mcg (1,000 unit) cap  Take  by mouth daily.                         Procedures done this admission:   * No surgery found *      Consults this admission:   None      Echocardiogram/EKG results:   No results found for this or any previous visit.            EKG Results               Procedure  720  Value  Units  Date/Time           EKG, 12 LEAD, INITIAL [[id number]][[id number]]  Collected: 04/13/21 1740       Order Status: Completed  Updated: 04/14/21 0713                Ventricular Rate  89  BPM           Atrial Rate  83  BPM           P-R Interval  129  ms           QRS Duration  96  ms           Q-T Interval  378  ms                  QTC Calculation (Bezet)  460  ms                  Calculated P Axis  66  degrees           Calculated R Axis  -126  degrees           Calculated T Axis  58  degrees                Diagnosis  --             Sinus rhythm   Atrial premature complexes   Probable right ventricular hypertrophy      Confirmed by Cruz Condon ([id number]) on 04/14/2021 7:12:57 AM                   Diagnostic Imaging/Tests:    XR CHEST PORT      Result Date: 04/13/2021   1. COPD. 2. Right perihilar opacity which is similar in appearance to previous imaging and may be scarring from prior inflammation. Underlying malignancy is considered less likely but is not excluded. Attention to this region is recommended at the time  of follow-up CT imaging.      CT CHEST PULMONARY EMBOLISM      Result Date: 04/14/2021   1. No evidence of pulmonary embolism. 2. The main pulmonary artery is enlarged measuring 3.5 cm. This finding suggests pulmonary hypertension. 3. Interval development of focal area of apparent consolidation in the right lower lobe. Mild infiltrate or  atelectasis is seen at the left base. There are small  bilateral pleural effusions. 4. Fluid/secretions are seen in the dependent portion of the distal trachea and bilateral mainstem bronchus. 5. COPD. Stable right upper lobe nodule. Chronic lung changes.            All Micro Results               Procedure  Component  Value  Units  Date/Time           BLOOD CULTURE [[id number]]  Collected: 04/13/21 1725            Order Status: Completed  Specimen: Blood  Updated: 04/15/21 1059                Special Requests:  --                  LEFT   Antecubital                   Culture result:  NO GROWTH 2 DAYS                BLOOD CULTURE [[id number]]  Collected: 04/13/21 1947            Order Status: Completed  Specimen: Blood  Updated: 04/15/21 1059                Special Requests:  --                  LEFT   Antecubital                   Culture result:  NO GROWTH 2 DAYS                MSSA/MRSA SC BY PCR, NASAL SWAB [[id number]]  (Abnormal)  Collected: 04/14/21 0109            Order Status: Completed  Specimen: Nasal swab  Updated: 04/14/21 0244                Special Requests:  NO SPECIAL REQUESTS                    Culture result:                 MRSA target DNA detected, SA  target DNA detected.       A positive test result does not necessarily indicate the presence of viable organisms.  It is however, presumptive for the presence of MRSA or SA.                                            RESULTS VERIFIED, PHONED TO AND READ BACK BY   Oralia Rud RN @ 0242 CAL              CULTURE, RESPIRATORY/SPUTUM/BRONCH Berniece Andreas [[id number]]  Collected: 04/13/21 2345            Order Status: Canceled  Specimen: Sputum             INFLUENZA A & B AG (RAPID TEST) [[id number]]  Collected: 04/13/21 1844            Order Status: Completed  Specimen: Nasal washing  Updated: 04/13/21 1904                Influenza A Ag  Negative                  Comment:  NEGATIVE FOR THE PRESENCE OF INFLUENZA A ANTIGEN   INFECTION DUE TO INFLUENZA A CANNOT BE RULED OUT.   BECAUSE THE ANTIGEN PRESENT IN THE  SAMPLE MAY BE BELOW   THE DETECTION LIMIT OF THE TEST.   A NEGATIVE TEST IS PRESUMPTIVE AND IT IS RECOMMENDED THAT THESE RESULTS BE CONFIRMED BY VIRAL CULTURE OR AN FDA-CLEARED INFLUENZA A AND B MOLECULAR ASSAY.                             Influenza B Ag  Negative                  Comment:  NEGATIVE FOR THE PRESENCE OF INFLUENZA B ANTIGEN   INFECTION DUE TO INFLUENZA B CANNOT BE RULED OUT.   BECAUSE THE ANTIGEN PRESENT IN THE SAMPLE MAY BE BELOW   THE DETECTION LIMIT OF THE TEST.   A NEGATIVE TEST IS PRESUMPTIVE AND IT IS RECOMMENDED THAT THESE RESULTS BE CONFIRMED BY VIRAL CULTURE OR AN FDA-CLEARED INFLUENZA A AND B MOLECULAR ASSAY.                             Source  Nasopharyngeal                COVID-19 RAPID TEST [[id number]]  Collected: 04/13/21 1826            Order Status: Completed  Specimen: Nasopharyngeal  Updated: 04/13/21 1859                Specimen source  Nasopharyngeal              COVID-19 rapid test  Not detected                  Comment:        The specimen is NEGATIVE for SARS-CoV-2, the novel coronavirus associated with COVID-19.   A negative result does not rule out COVID-19.         This test has been authorized by the FDA under an Emergency Use Authorization (EUA) for use by authorized laboratories.  Fact sheet for Healthcare Providers: FlickSafe.gl   Fact sheet for Patients: [place].at         Methodology: Isothermal Nucleic Acid Amplification                        INFLUENZA A & B AG (RAPID TEST) [[id number]]  Collected: 04/13/21 1826            Order Status: Canceled  Specimen: Nasopharyngeal                     Labs:  Results:                  BMP, Mg, Phos  Recent Labs         04/15/21   0315  04/14/21   0312  04/13/21   1725      NA  143  141  141      K  3.9  4.2  4.1      CL  111*  109*  105      CO2  27  28  29       AGAP  5*  4*  7      BUN  30*  29*  29*      CREA  0.70*  0.80  0.90      CA  8.5  8.4  9.6      GLU   169*  156*  109*      MG  2.0   --    --       PHOS  1.5*   --    --               CBC  Recent Labs         04/15/21   0315  04/14/21   0312  04/13/21   1725      WBC  8.3  9.3  8.9      RBC  2.80*  3.14*  3.89*      HGB  9.0*  10.1*  12.4*      HCT  28.2*  31.7*  39.0*      PLT  166  174  225      GRANS   --    --   85*      LYMPH   --    --   5*      EOS   --    --   0*      MONOS   --    --   9      BASOS   --    --   0      IG   --    --   0      ANEU   --    --   7.6      ABL   --    --   0.5      ABE   --    --   0.0      ABM   --    --   0.8      ABB   --    --   0.0      AIG   --    --   0.0           LFT  Recent Labs  04/13/21   1725      ALT  22      AP  96      TP  7.4      ALB  3.6      GLOB  3.8*      AGRAT  0.9*              Cardiac Testing  No results found for: BNPP, BNP, CPK, RCK1, RCK2, RCK3, RCK4, CKMB, CKNDX, CKND1, TROPT, TROIQ     Coagulation Tests  No results found for: PTP, INR, APTT, INREXT     A1c  No results found for: HBA1C, HBA1CEXT     Lipid Panel  No results found for: CHOL, CHOLPOCT, CHOLX, CHLST, CHOLV, 884269, HDL, HDLP, LDL, LDLC, DLDLP, 804564, VLDLC, VLDL, TGLX, TRIGL, TRIGP, TGLPOCT, CHHD,  CHHDX     Thyroid Panel  No results found for: TSH, T4, FT4, TT3, T3U, TSHEXT          Most Recent UA  Lab Results      Component  Value  Date/Time        Color  YELLOW  02/14/2021 01:14 PM        Appearance  CLEAR  02/14/2021 01:14 PM        pH (UA)  6.0  02/14/2021 01:14 PM        Protein  TRACE (A)  02/14/2021 01:14 PM        Glucose  Negative  02/14/2021 01:14 PM        Ketone  Negative  02/14/2021 01:14 PM        Bilirubin  Negative  02/14/2021 01:14 PM        Blood  MODERATE (A)  02/14/2021 01:14 PM        Urobilinogen  0.2  02/14/2021 01:14 PM        Nitrites  Negative  02/14/2021 01:14 PM        Leukocyte Esterase  Negative  02/14/2021 01:14 PM        WBC  0-3  04/14/2021 06:31 AM        RBC  3-5  04/14/2021 06:31 AM        Epithelial cells  0-3  04/14/2021 06:31 AM         Bacteria  TRACE  04/14/2021 06:31 AM        Casts  0  04/14/2021 06:31 AM        Crystals, urine  0  04/14/2021 06:31 AM        Mucus  1+ (H)  04/14/2021 06:31 AM        Other observations  RESULTS VERIFIED MANUALLY  04/14/2021 06:31 AM                    All Labs from Last 24 Hrs:     Recent Results (from the past 24 hour(s))     PLEASE READ & DOCUMENT PPD TEST IN 48 HRS          Collection Time: 04/16/21 12:04 AM         Result  Value  Ref Range            PPD  Negative  Negative            mm Induration  0  0 - 5 mm           Current Med List in Hospital:      Current Facility-Administered Medications  Medication  Dose  Route  Frequency           ?  prednisoLONE (PRELONE) syrup 40 mg   40 mg  Per G Tube  DAILY     ?  ampicillin-sulbactam (UNASYN) 3 g in 0.9% sodium chloride (MBP/ADV) 100 mL MBP   3 g  IntraVENous  Q6H     ?  oxyCODONE (ROXICODONE) 5 mg/5 mL oral solution 10 mg   10 mg  Oral  Q4H PRN     ?  doxycycline (VIBRAMYCIN) 100 mg in 0.9% sodium chloride (MBP/ADV) 100 mL MBP   100 mg  IntraVENous  Q12H     ?  HYDROcodone-homatropine (HYCODAN) 5-1.5 mg/5 mL (5 mL) oral solution 5 mL   5 mL  Oral  Q4H PRN     ?  NUTRITIONAL SUPPORT ELECTROLYTE PRN ORDERS     Does Not Apply  PRN     ?  morphine injection 2 mg   2 mg  IntraVENous  Q4H PRN     ?  sodium chloride (NS) flush 5-10 mL   5-10 mL  IntraVENous  Q8H           ?  sodium chloride (NS) flush 5-10 mL   5-10 mL  IntraVENous  PRN           ?  fluticasone propionate (FLONASE) 50 mcg/actuation nasal spray 2 Spray   2 Spray  Both Nostrils  DAILY     ?  albuterol-ipratropium (DUO-NEB) 2.5 MG-0.5 MG/3 ML   3 mL  Nebulization  Q6H RT     ?  gabapentin (NEURONTIN) capsule 200 mg   200 mg  Oral  TID PRN     ?  sertraline (ZOLOFT) tablet 50 mg   50 mg  Per G Tube  DAILY     ?  sodium chloride (NS) flush 5-40 mL   5-40 mL  IntraVENous  Q8H     ?  sodium chloride (NS) flush 5-40 mL   5-40 mL  IntraVENous  PRN     ?  acetaminophen (TYLENOL) tablet 650 mg   650  mg  Oral  Q6H PRN          Or           ?  acetaminophen (TYLENOL) suppository 650 mg   650 mg  Rectal  Q6H PRN     ?  polyethylene glycol (MIRALAX) packet 17 g   17 g  Oral  DAILY PRN     ?  ondansetron (ZOFRAN ODT) tablet 4 mg   4 mg  Oral  Q8H PRN          Or           ?  ondansetron (ZOFRAN) injection 4 mg   4 mg  IntraVENous  Q6H PRN     ?  enoxaparin (LOVENOX) injection 30 mg   30 mg  SubCUTAneous  DAILY     ?  guaiFENesin-dextromethorphan (ROBITUSSIN DM) 100-10 mg/5 mL syrup 10 mL   10 mL  Oral  Q6H PRN     ?  nicotine (NICODERM CQ) 14 mg/24 hr patch 1 Patch   1 Patch  TransDERmal  DAILY PRN     ?  cholecalciferol (VITAMIN D3) (1000 Units /25 mcg) tablet 1,000 Units   1,000 Units  PEG Tube  DAILY     ?  guaiFENesin (ROBITUSSIN) 100 mg/5 mL oral liquid 400 mg   400 mg  Per G Tube  BID     ?  rosuvastatin (CRESTOR) tablet 10 mg   10 mg  Per G Tube  QHS     ?  traZODone (DESYREL) tablet 50 mg   50 mg  Per G Tube  QHS     ?  LORazepam (ATIVAN) tablet 0.5 mg   0.5 mg  Per G Tube  DAILY PRN     ?  albuterol (PROVENTIL VENTOLIN) nebulizer solution 2.5 mg   2.5 mg  Nebulization  Q6H PRN           ?  famotidine (PEPCID) tablet 20 mg   20 mg  Per G Tube  DAILY             Allergies        Allergen  Reactions         ?  Cat Dander  Swelling          Immunization History        Administered  Date(s) Administered         ?  Influenza Vaccine  12/23/2018         ?  TB Skin Test (PPD) Intradermal  04/14/2021           Recent Vital Data:   Patient Vitals for the past 24 hrs:            Temp  Pulse  Resp  BP  SpO2            04/16/21 0748  98.3 ??F (36.8 ??C)  81  18  131/61  98 %            04/16/21 0724  --  --  --  --  93 %     04/16/21 0258  --  --  --  --  92 %     04/16/21 0245  97.7 ??F (36.5 ??C)  82  20  113/65  92 %     04/15/21 2319  97.6 ??F (36.4 ??C)  80  20  110/63  92 %     04/15/21 2020  --  --  --  --  92 %     04/15/21 1908  98 ??F (36.7 ??C)  75  20  (!) 121/59  92 %     04/15/21 1549  --  --  --  --  92 %      04/15/21 1506  97.9 ??F (36.6 ??C)  71  20  (!) 120/55  92 %            04/15/21 1109  97.9 ??F (36.6 ??C)  88  20  (!) 110/50  92 %        Oxygen Therapy   O2 Sat (%): 98 % (04/16/21 0748)   Pulse via Oximetry: 60 beats per minute (04/16/21 0724)   O2 Device: Nasal cannula (04/16/21 0724)   O2 Flow Rate (L/min): 1 l/min (04/16/21 0724)   FIO2 (%): 32 % (04/13/21 1842)      Estimated body mass index is 14.5 kg/m?? as calculated from the following:     Height as of this encounter: 5\' 4"  (1.626 m).     Weight as of this encounter: 38.3 kg (84 lb 8 oz).      Intake/Output Summary (Last 24 hours) at 04/16/2021 0820   Last data filed at 04/16/2021 0626     Gross per 24 hour        Intake  2360 ml        Output  50 ml        Net  2310 ml             Physical Exam:   General:    Well nourished.  No overt distress   Head:  Normocephalic, atraumatic   Eyes:  Sclerae appear normal.  Pupils equally round.     HENT:  Nares appear normal, no drainage.  Moist mucous membranes   Neck:  No restricted ROM.  Trachea midline   CV:   RRR.  No JVD   Lungs:   CTAB diminished  Even, unlabored   Abdomen:   Nondistended.     Extremities: No cyanosis or clubbing.  No edema.     Skin:     No rashes.  Normal coloration   Neuro:  CN II-XII grossly intact.   Psych:  Normal mood and affect.         Signed:   Elmer Ramp, MD      Part of this note may have been written by using a voice dictation software.  The note has been proof read but may still contain some grammatical/other typographical errors.

## 2021-04-16 NOTE — Progress Notes (Signed)
Chart screened by CM for d/c planning.  Pt resides with a roommate in an apartment.  No home DME.  Independent with ADLs at baseline but does not drive.  His roommate provides transportation.  Pt's ex-wife Sinjin Liu [phone] is his emergency contact.  Pt presented to the ED with c/o SOB.  Pt admitted with Dx of Acute respiratory failure with hypoxia, Hx of tongue CA w/ recently completed XRT, Severe COPD, Current tobacco use (smokes 0.75 packs of cigarettes/day), Bacterial PNA, Acute COPD exacerbation, HLD, Severe protein-calorie malnutrition, Aspiration PNA, Anxiety, Emphysema lung, and Depression.  PT and OT have evaluated pt and do not recommend any further skilled therapy or DME at d/c.  Pt has a PEG tube that he manages at home.  He has a PICC line and receives 1L IVF every other day for hydration.  Pt is requiring 1L O2 NC.    Pt to d/c home today.  If unable to wean pt off of 1L O2 NC a will be need prior to d/c to determine if pt requires home O2.    Care Management Interventions  PCP Verified by CM: Yes Murrell Converse, MD)  Mode of Transport at Discharge: Other (see comment) (Friend)  Transition of Care Consult (CM Consult): Discharge Planning  Discharge Durable Medical Equipment: No  Physical Therapy Consult: Yes  Occupational Therapy Consult: Yes  Speech Therapy Consult: No  Support Systems: Spouse/Significant Other,Friend/Neighbor  Confirm Follow Up Transport: Friends  The Plan for Transition of Care is Related to the Following Treatment Goals : Pt to obtain care to become medically stable and to return with a safe transition.  The Patient and/or Patient Representative was Provided with a Choice of Provider and Agrees with the Discharge Plan?: Yes  Name of the Patient Representative Who was Provided with a Choice of Provider and Agrees with the Discharge Plan: Deidre Ala  Freedom of Choice List was Provided with Basic Dialogue that Supports the Patient's Individualized Plan of  Care/Goals, Treatment Preferences and Shares the Quality Data Associated with the Providers?: Yes  Veteran Resource Information Provided?: No  Discharge Location  Patient Expects to be Discharged to:: Home

## 2021-04-16 NOTE — Progress Notes (Signed)
 END OF SHIFT NOTE:    Intake/Output  05/08 1901 - 05/09 0700  In: 720 [I.V.:300]  Out: 50 [Urine:50]   Voiding: YES  Catheter: NO  Drain:              Stool:  0 occurrences.         Emesis:  0 occurrences.          VITAL SIGNS  Patient Vitals for the past 12 hrs:   Temp Pulse Resp BP SpO2   04/16/21 0258 -- -- -- -- 92 %   04/16/21 0245 97.7 F (36.5 C) 82 20 113/65 92 %   04/15/21 2319 97.6 F (36.4 C) 80 20 110/63 92 %   04/15/21 2020 -- -- -- -- 92 %   04/15/21 1908 98 F (36.7 C) 75 20 (!) 121/59 92 %       Pain Assessment  Pain 1  Pain Scale 1: Visual (04/16/21 0245)  Pain Intensity 1: 0 (04/16/21 0245)  Patient Stated Pain Goal: 0 (04/16/21 0245)  Pain Reassessment 1: Yes (04/15/21 2319)  Pain Onset 1: chronic (04/14/21 1330)  Pain Location 1: Mouth (04/15/21 2319)  Pain Description 1: Constant;Aching (04/15/21 2319)  Pain Intervention(s) 1: Medication (see MAR) (04/15/21 2319)    Ambulating  Yes    Additional Information:   robintussin x1  Morphine x1   Feeding x1 at 11pm       Shift report given to oncoming nurse at the bedside.    Thien Nguyen, RN

## 2021-04-16 NOTE — Progress Notes (Signed)
Received pt from RN [facility]) in stable condition. Pt in bed resting quietly. Resp even & unlabored on 1L NC; no acute signs of distress noted. Bed low & locked; call light in reach; no needs voiced.

## 2021-04-17 ENCOUNTER — Encounter

## 2021-04-17 NOTE — Progress Notes (Signed)
 Transition of Care Hospital Discharge Follow-Up      Date/Time:  04/17/2021 11:14 AM  ACM contacted the patient by telephone to perform post hospital discharge follow up. Verified name and DOB with patient as identifiers.     ACM reinforced discharge instructions and red flags with patient who verbalized understanding. Patient given an opportunity to ask questions and does not have any further questions or concerns at this time. The patient agrees to contact the PCP office for questions related to their healthcare.ACM provided contact information for future reference.    Disease Specific:   N/A     PEG tube that he manages at home.  He has a PICC line and receives 1L IVF every other day for hydration.  Pt is requiring 1L O2 NC.    Patients top risk factors for readmission:  medical condition    Home Health Active: SN  Home Health company: [facility]    Medication(s):   Medication changes since discharge: amoxicillin , prednisone prescriptions pt to pick up today.    Current Outpatient Medications   Medication Sig   . prednisoLONE (PRELONE) 15 mg/5 mL syrup 13.5 mL by Per G Tube route See Admin Instructions. Take 10ml daily for 3d, then 5ml daily for 3d, then stop   . amoxicillin -clavulanate (AUGMENTIN ) 400-57 mg/5 mL suspension 5 mL by Per G Tube route two (2) times a day for 5 days.   . oxyCODONE  IR (ROXICODONE ) 10 mg tab immediate release tablet Take 1 Tablet by mouth every six (6) hours as needed for Pain for up to 30 days. Max Daily Amount: 40 mg.   . rosuvastatin  (CRESTOR ) 10 mg tablet    . sertraline  (Zoloft ) 20 mg/mL concentrated solution Take 2.5 mL by mouth daily. Indications: anxiousness associated with depression   . chlorhexidine  (PERIDEX ) 0.12 % solution 10 mL 4 TIMES DAILY (route: mucous membrane)   . fluticasone -umeclidinium-vilanterol (Trelegy Ellipta ) 100-62.5-25 mcg inhaler 2 inhalation DAILY (route: inhalation)   . nutritional supplements (Nutren 1.5) 0.07 gram-1.5 kcal/mL liqd 250 mL EVERY 4 HOURS  (route: feeding tube)   . fluticasone  propionate (FLONASE ) 50 mcg/actuation nasal spray 2 Sprays by Both Nostrils route daily.   . albuterol -ipratropium (DUO-NEB) 2.5 mg-0.5 mg/3 ml nebu 3 mL by Nebulization route every six (6) hours as needed for Wheezing.   . budesonide  (Pulmicort ) 0.5 mg/2 mL nbsp 2 mL by Nebulization route two (2) times a day.   . albuterol  (PROVENTIL  HFA, VENTOLIN  HFA, PROAIR  HFA) 90 mcg/actuation inhaler Take 2 Puffs by inhalation every six (6) hours as needed for Wheezing.   . traZODone  (DESYREL ) 50 mg tablet Take 1 Tablet by mouth nightly.   . gabapentin  (NEURONTIN ) 100 mg capsule Take 2 Capsules by mouth three (3) times daily as needed for Pain (Restless leg).   . ibuprofen  (MOTRIN ) 800 mg tablet Take 1 tablet by mouth twice daily as needed for pain   . LORazepam  (ATIVAN ) 0.5 mg tablet Take 1 Tablet by mouth daily. Max Daily Amount: 0.5 mg.   . artificial saliva (MOUTH KOTE) spra Take 1 Spray by mouth as needed (dry mouth).   . diphenoxylate -atropine  (LomotiL ) 2.5-0.025 mg per tablet Take 1 Tablet by mouth four (4) times daily as needed for Diarrhea. Max Daily Amount: 4 Tablets.   . multivitamin (ONE A DAY) tablet Take 1 Tablet by mouth daily.   . cholecalciferol  (Vitamin D3) 25 mcg (1,000 unit) cap Take  by mouth daily.     No current facility-administered medications for this visit.  [facility] follow up appointment(s):   Future Appointments   Date Time Provider Department Center   04/26/2021  1:00 PM [facility] NM PET/CT INJ RM GCCRMPETG GVL GCC   05/03/2021  1:00 PM Jobe Cancer, MD [facility] G[facility]   07/09/2021  4:45 PM SFD CT 64 SLICE UNIT 1 SFDRCT SFD      Non-[facility] follow up appointment(s): na  Patient attended follow up appointments since last contact:   What was the outcome of the appointment:     Other Issues/ Miscellaneous? (Transportation, access to meals, ability to perform ADLs, adequate caregiver support, etc.) na  Referrals needed? (SW, Diabetes education, HH, etc.) na      Goals     .  Patient/Family verbalizes understanding of self-management of chronic disease.      1. Pt/family will verbalize prevention of dehydration, s/s to report to physicians w/in 4wks.   2. Pt/family will verbalize resources for care coordination w/ in 4wks.            RNCM called to schedule f/u PCP appt for May 18th 930a. RNCM notified pt who is agreeable.   Plan for follow-up call in 7-10 days.

## 2021-04-17 NOTE — Telephone Encounter (Signed)
[facility] Discharge 05/09- Pneumonia please call 24/48 Patient scheduled with Shanon Brow

## 2021-04-18 ENCOUNTER — Encounter

## 2021-04-18 LAB — CULTURE, BLOOD 1
Culture: NO GROWTH
Culture: NO GROWTH

## 2021-04-18 LAB — CULTURE, BLOOD
Culture result:: NO GROWTH
Culture result:: NO GROWTH

## 2021-04-18 MED ORDER — IBUPROFEN 800 MG TAB
800 mg | ORAL_TABLET | ORAL | 0 refills | Status: AC
Start: 2021-04-18 — End: ?

## 2021-04-18 NOTE — Telephone Encounter (Signed)
Noted  

## 2021-04-18 NOTE — Telephone Encounter (Signed)
Care Transitions Initial Follow Up Call    Outreach made within 2 business days of discharge: Yes    Patient: Caleb Robinson Patient DOB: 1951-02-05   MRN: [id number]  Reason for Admission: Pneumonia   Discharge Date: 04/16/21       Spoke with: 04/18/2021- Called patient 9:45, unable to leave message mail box full.,     Discharge department/facility: [facility] Patient Contact:  Was patient able to fill all prescriptions: Yes  Was patient instructed to bring all medications to the follow-up visit: Yes  Is patient taking all medications as directed in the discharge summary? Yes  Does patient understand their discharge instructions: Yes  Does patient have questions or concerns that need addressed prior to 7-14 day follow up office visit: No    Scheduled appointment with PCP within 7-14 days    Follow Up  Future Appointments   Date Time Provider [place]   04/25/2021  9:30 AM Sueanne Margarita, PA-C [facility] FPA FPA   04/26/2021  1:00 PM [place] NM PET/CT INJ RM GCCRMPETG GVL GCC   05/03/2021  1:00 PM Osa Craver, MD [facility] GVL [facility]   07/09/2021  4:09 PM SFD CT 37 SLICE UNIT 1 SFDRCT SFD       Lennox Laity

## 2021-04-20 ENCOUNTER — Encounter

## 2021-04-22 MED ORDER — MIRTAZAPINE 15 MG TAB
15 mg | ORAL_TABLET | ORAL | 0 refills | Status: AC
Start: 2021-04-22 — End: ?

## 2021-04-25 ENCOUNTER — Encounter

## 2021-04-25 ENCOUNTER — Encounter: Attending: Medical | Primary: Family Medicine

## 2021-04-26 ENCOUNTER — Inpatient Hospital Stay: Admit: 2021-04-26 | Payer: MEDICARE | Primary: Family Medicine

## 2021-04-26 DIAGNOSIS — C01 Malignant neoplasm of base of tongue: Secondary | ICD-10-CM

## 2021-04-26 LAB — POCT GLUCOSE: POC Glucose: 139 mg/dL — ABNORMAL HIGH (ref 65–100)

## 2021-04-26 LAB — GLUCOSE, POC: Glucose (POC): 139 mg/dL — ABNORMAL HIGH (ref 65–100)

## 2021-04-26 MED ORDER — DIATRIZOATE MEGLUMINE & SODIUM 66 %-10 % ORAL SOLN
66-10 % | Freq: Once | ORAL | Status: DC
Start: 2021-04-26 — End: 2021-04-26

## 2021-04-26 MED ORDER — SALINE PERIPHERAL FLUSH PRN
Freq: Once | INTRAMUSCULAR | Status: AC
Start: 2021-04-26 — End: 2021-04-26
  Administered 2021-04-26: 17:00:00

## 2021-04-26 MED ORDER — FLUDEOXYGLUCOSE F-18 20 MCI TO 200 MCI/ML INTRAVENOUS SOLUTION
20 mCi to 0 mCi/mL | Freq: Once | INTRAVENOUS | Status: AC
Start: 2021-04-26 — End: 2021-04-26
  Administered 2021-04-26: 17:00:00 via INTRAVENOUS

## 2021-04-27 ENCOUNTER — Encounter

## 2021-05-03 ENCOUNTER — Encounter

## 2021-05-03 ENCOUNTER — Inpatient Hospital Stay: Admit: 2021-05-03 | Payer: MEDICARE | Attending: Radiation Oncology | Primary: Family Medicine

## 2021-05-03 ENCOUNTER — Ambulatory Visit: Admit: 2021-05-03 | Discharge: 2021-05-03 | Payer: MEDICARE | Primary: Family Medicine

## 2021-05-03 ENCOUNTER — Encounter: Payer: MEDICARE | Attending: Radiation Oncology | Primary: Family Medicine

## 2021-05-03 ENCOUNTER — Encounter: Primary: Family Medicine

## 2021-05-03 DIAGNOSIS — C049 Malignant neoplasm of floor of mouth, unspecified: Secondary | ICD-10-CM

## 2021-05-03 DIAGNOSIS — Z008 Encounter for other general examination: Secondary | ICD-10-CM

## 2021-05-03 NOTE — Progress Notes (Signed)
Patient: Caleb Robinson MRN: [id number]  SSN: [id number]    Date of Birth: 1951/05/15  Age: 70 y.o.  Sex: male      Other Providers:  Dr. Benard Rink    DIAGNOSIS: [id number] floor of mouth SCCa    PREVIOUS TREATMENT:  1. 09/28/20: tumor resection and floor of mouth reconstruction  2. 02/08/2021: adjuvant RT, 6000 cGy in 30 fractions    INTERVAL HISTORY:  Caleb Robinson is a 70 y.o. male is here for f/u. He had a PET/CT which shows no evidence of recurrence. He notes some improvement in his weight gain. Improvement in pain. He continues to smoke.    UPDATED PAST MEDICAL HISTORY:  Since our prior encounter, Caleb Robinson has not been hospitalized.  There have been no significant changes to the medical history.     MEDICATIONS:     Current Outpatient Medications:   ???  albuterol-ipratropium (DUO-NEB) 2.5 mg-0.5 mg/3 ml nebu, 3 mL by Nebulization route every six (6) hours as needed for Wheezing., Disp: 120 Nebule, Rfl: 5  ???  budesonide (Pulmicort) 0.5 mg/2 mL nbsp, 2 mL by Nebulization route two (2) times a day., Disp: 60 Each, Rfl: 11  ???  albuterol (PROVENTIL HFA, VENTOLIN HFA, PROAIR HFA) 90 mcg/actuation inhaler, Take 2 Puffs by inhalation every six (6) hours as needed for Wheezing., Disp: 1 Each, Rfl: 11  ???  traZODone (DESYREL) 50 mg tablet, Take 1 Tablet by mouth nightly., Disp: 90 Tablet, Rfl: 3  ???  sertraline (Zoloft) 20 mg/mL concentrated solution, Take 2.5 mL by mouth daily. Indications: anxiousness associated with depression, Disp: 75 mL, Rfl: 0  ???  gabapentin (NEURONTIN) 100 mg capsule, Take 2 Capsules by mouth three (3) times daily as needed for Pain (Restless leg)., Disp: 360 Capsule, Rfl: 3  ???  oxyCODONE IR (ROXICODONE) 10 mg tab immediate release tablet, Take 1 Tablet by mouth every six (6) hours as needed for Pain for up to 30 days. Max Daily Amount: 40 mg., Disp: 120 Tablet, Rfl: 0  ???  ibuprofen (MOTRIN) 800 mg tablet, Take 1 tablet by mouth twice daily as needed for pain,  Disp: 60 Tablet, Rfl: 0  ???  LORazepam (ATIVAN) 0.5 mg tablet, Take 1 Tablet by mouth daily. Max Daily Amount: 0.5 mg., Disp: 30 Tablet, Rfl: 0  ???  artificial saliva (MOUTH KOTE) spra, Take 1 Spray by mouth as needed (dry mouth)., Disp: 59 mL, Rfl: 1  ???  diphenoxylate-atropine (LomotiL) 2.5-0.025 mg per tablet, Take 1 Tablet by mouth four (4) times daily as needed for Diarrhea. Max Daily Amount: 4 Tablets., Disp: 60 Tablet, Rfl: 5  ???  multivitamin (ONE A DAY) tablet, Take 1 Tablet by mouth daily., Disp: , Rfl:   ???  cholecalciferol (Vitamin D3) 25 mcg (1,000 unit) cap, Take  by mouth daily., Disp: , Rfl:     ALLERGIES:   Allergies   Allergen Reactions   ??? Cat Dander Swelling       PHYSICAL EXAMINATION:   ECOG Performance status 1  VITAL SIGNS:   Visit Vitals  BP (!) 109/50 (BP 1 Location: Left upper arm, BP Patient Position: Sitting)   Pulse (!) 58   Temp 97.5 ??F (36.4 ??C)   Resp 16   Wt 42.3 kg (93 lb 3.2 oz)   SpO2 97%   BMI 16.00 kg/m??      General: well developed/nourished adult Male in no acute distress; appears stated age  8: normocephalic, atraumatic; EOMI; exposed hardware in  FOM with associated crusting  Neck: supple with full ROM; no cervical lymphadenopathy  Respiratory: normal inspiratory effort, no audible wheezes  Extremities: no cyanosis, clubbing, or edema  Musculoskeletal: mobility intact x4; normal ROM in all joints  Skin: no skin lesions identified  Neuro: AOx3; sensation intact x 4; CNII-XII grossly intact  Psych: appropriate affect, insight, and judgement  GI: abdomen soft, non-distended    LABORATORY:   Lab Results   Component Value Date/Time    Sodium 141 02/14/2021 10:08 AM    Potassium 4.8 02/14/2021 10:08 AM    Chloride 109 (H) 02/14/2021 10:08 AM    CO2 30 02/14/2021 10:08 AM    Anion gap 2 (L) 02/14/2021 10:08 AM    Glucose 88 02/14/2021 10:08 AM    BUN 35 (H) 02/14/2021 10:08 AM    Creatinine 0.80 02/14/2021 10:08 AM    GFR est AA >60 02/14/2021 10:08 AM    GFR est non-AA >60  02/14/2021 10:08 AM    Calcium 9.6 02/14/2021 10:08 AM    Magnesium 2.4 02/14/2021 10:08 AM    Albumin 3.4 02/14/2021 10:08 AM    Protein, total 7.2 02/14/2021 10:08 AM    Globulin 3.8 (H) 02/14/2021 10:08 AM    A-G Ratio 0.9 (L) 02/14/2021 10:08 AM    ALT (SGPT) 38 02/14/2021 10:08 AM     Lab Results   Component Value Date/Time    WBC 4.6 02/14/2021 10:08 AM    HGB 12.1 (L) 02/14/2021 10:08 AM    HCT 39.3 (L) 02/14/2021 10:08 AM    PLATELET 194 02/14/2021 10:08 AM       RADIOLOGY:  PET/CT reviewed by me - see HPI    IMPRESSION:  Caleb Robinson is a 70 y.o. 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    PLAN:    F/U in 3 mos with repeat CT.  He continues f/u with Dr. Benard Rink.    Portions of this note were copied from prior encounters and reviewed for accuracy, currency, and represent documentation and tasks completed during this encounter.  I verify and attest these portions to be unchanged from prior visits.      Osa Craver, MD  05/03/21

## 2021-05-03 NOTE — Progress Notes (Signed)
Pt is here today for FUP with Dr. 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  Pt is s/p a radical neck dissection and tongue excision with mandibular reconstruction by Dr. Elana Alm at [facility] 09/27/20.  Pt is c/o pain level 7/10 in his mouth.  The 04/26/21 PET scan indicated no mets.  Pt will return in 3 months for FUP with a CT/Neck scan prior.

## 2021-05-04 NOTE — Progress Notes (Signed)
Nutrition F/U:  Assessment:  Pt seen after office visit w/ Dr. Nance Pew, PET scan showing post surgical changes but NED, has follow up with [facility] in June to discuss further surgery for damaged tissues.  Pt remains TF dependent for 100% estimated nutrition needs, bolus feeding 5-6 cartons/day of Nutren 1.5, receiving 1 L NS at home every other day via PICC line - home health doing dressing changes; pt wishes to continue home IVF.  Pt w/ hospital admission earlier this month for aspiration PNA. PCP no longer refilling pain meds, [facility] sent most recent prescription and referral has been made to pain management - instructed caregiver to call if she has not heard from them by end of next week.  Current BW: 89#, down 4# over the past 6 weeks (fluctuates between 89-93#).     Intervention:  1. NPO, bolus liquids via syringe.  Further diet advancement per SLP - will have consult at next visit at [facility].  Unfortunately pt cannot participate in OP SLP sessions w/ continued home health for IVF.  2. IVF - 1 L NS every other day, managed by [facility].  Home health changing PICC line dressing.   3. Referral made to [facility] on (5/20)  4. Repeat CT scan at the end of August - follow up with Dr. Sonny Masters afterwards     Monitoring/Evaluation:  1. RD to follow up during next office visit - follow up wt status, tolerance/intake of TF, symptom management.      [place], [place], [place]

## 2021-05-11 NOTE — Care Coordination-Inpatient (Signed)
Ambulatory Care Coordination Note  05/11/2021  CM Risk Score: 1  Charlson 10 Year Mortality Risk Score: 79%     ACC: Tilford Pillar, RN    Summary Note:   Pt reports pain, taking oxycodone.   Pt continues w/ PEG and IVFs. Current w/ [facility].     Pt reports ONC MD requests that PCP refill meds.  Pt requests refill pain medication last filled on 04/13/21 for 30d supply:    oxyCODONE IR (ROXICODONE) 10 mg tab immediate release tablet, Take 1 Tablet by mouth every six (6) hours as needed for Pain for up to 30 days. Max Daily Amount: 40 mg., Disp: 120 Tablet, Rfl: 0    Lab Results     None          Care Coordination Interventions    Referral from Primary Care Provider: No  Suggested Interventions and Community Resources       Goals Addressed    None         Prior to Admission medications    Medication Sig Start Date End Date Taking? Authorizing Provider   albuterol sulfate HFA 108 (90 Base) MCG/ACT inhaler Inhale 2 puffs into the lungs every 6 hours as needed 02/14/21   Ar Automatic Reconciliation   budesonide (PULMICORT) 0.5 MG/2ML nebulizer suspension Inhale 500 mcg into the lungs 2 times daily 02/14/21   Ar Automatic Reconciliation   chlorhexidine (PERIDEX) 0.12 % solution 10 mL 4 TIMES DAILY (route: mucous membrane) 10/14/20   Ar Automatic Reconciliation   vitamin D 25 MCG (1000 UT) CAPS Take by mouth daily    Ar Automatic Reconciliation   diphenoxylate-atropine (LOMOTIL) 2.5-0.025 MG per tablet Take 1 tablet by mouth 4 times daily as needed. 01/23/21   Ar Automatic Reconciliation   fluticasone (FLONASE) 50 MCG/ACT nasal spray 2 sprays by Nasal route daily 03/26/21   Ar Automatic Reconciliation   fluticasone-umeclidin-vilant (TRELEGY ELLIPTA) 100-62.5-25 MCG/INH AEPB 2 inhalation DAILY (route: inhalation) 08/21/20   Ar Automatic Reconciliation   gabapentin (NEURONTIN) 100 MG capsule Take 200 mg by mouth 3 times daily as needed. 02/06/21   Ar Automatic Reconciliation   ibuprofen (ADVIL;MOTRIN) 800 MG tablet Take 1 tablet by mouth  twice daily as needed for pain 02/04/21   Ar Automatic Reconciliation   ipratropium-albuterol (DUONEB) 0.5-2.5 (3) MG/3ML SOLN nebulizer solution Inhale 3 mLs into the lungs every 6 hours as needed 02/14/21   Ar Automatic Reconciliation   LORazepam (ATIVAN) 0.5 MG tablet Take 0.5 mg by mouth daily. 01/30/21   Ar Automatic Reconciliation   sertraline (ZOLOFT) 20 MG/ML concentrated solution 2.5 mL DAILY (route: oral) 02/06/21   Ar Automatic Reconciliation   traZODone (DESYREL) 50 MG tablet Take 50 mg by mouth 02/06/21   Ar Automatic Reconciliation       Future Appointments   Date Time Provider [place]   05/21/2021  8:40 AM Thomasene Ripple, APRN - CNP PPS GVL AMB   05/23/2021  9:45 AM [name], MD FPA GVL AMB   07/09/2021  2:58 PM SFD CT 60 SLICE UNIT 1 SFDRCT SFD   08/03/2021  5:27 PM SFD CT 5 SLICE UNIT 1 SFDRCT SFD   08/06/2021 10:10 AM [place] [facility]   08/06/2021 10:45 AM Rayann Heman, MD [facility] AMB   08/06/2021 10:45 AM Carron Brazen Garey Ham, RD [facility] AMB   08/09/2021  1:00 PM Osa Craver, MD [facility]      and   General Assessment  Do you have any symptoms that are causing concern?: No

## 2021-05-11 NOTE — Care Coordination-Inpatient (Signed)
RNCM attempted to reach pt, no answer left HIPAA compliant vm.  Will continue attempts to reach pt.

## 2021-05-17 ENCOUNTER — Encounter

## 2021-05-17 MED ORDER — OXYCODONE HCL 10 MG PO TABS
10 | ORAL_TABLET | Freq: Four times a day (QID) | ORAL | 0 refills | Status: AC | PRN
Start: 2021-05-17 — End: 2021-06-16

## 2021-05-17 NOTE — Telephone Encounter (Signed)
H&N Navigation:  Received call from pt's ex-wife Eustaquio Maize - he had a visit at [facility] yesterday and plan is to remove bone/plate and start over, but pt needs to gain weight and stop smoking first; they have follow up in 3 months.  Referral was made to [facility] on (5/20), but [name] reports they have not heard from them.  Called [facility], and they have the referral but have been calling Mr. Skalla and vm box is full; provided [name]'s contact information to call and schedule appointment.  Requested Palliative Care refill pt's pain meds 1 more time until he is established w/ pain management.      [place], [place], [place]

## 2021-05-17 NOTE — Progress Notes (Signed)
I have reviewed the patient's controlled substance prescription history, as maintained in the [place] prescription monitoring program, so that the prescriptions(s) for a controlled substance can be given.  Last Date Reviewed: 05/17/21

## 2021-05-21 ENCOUNTER — Encounter: Payer: MEDICARE | Attending: Family | Primary: Family Medicine

## 2021-05-21 ENCOUNTER — Encounter: Attending: Family | Primary: Family Medicine

## 2021-05-23 ENCOUNTER — Encounter: Attending: Family Medicine | Primary: Family Medicine

## 2021-05-23 NOTE — Progress Notes (Deleted)
Caleb Robinson (DOB:  Aug 25, 1951) is a 70 y.o. male,{New vs Established:[id number]::"Established patient"}, here for evaluation of the following chief complaint(s):  No chief complaint on file.         ASSESSMENT/PLAN:  {There are no diagnoses linked to this encounter. (Refresh or delete this SmartLink)}    No follow-ups on file.         Subjective   SUBJECTIVE/OBJECTIVE:  HPI  pt's ex-wife Caleb Robinson - he had a visit at [facility] yesterday and plan is to remove bone/plate and start over, but pt needs to gain weight and stop smoking first; they have follow up in 3 months.  Referral was made to [facility] on (5/20), but Caleb Robinson reports they have not heard   Review of Systems       Objective   Physical Exam       {Time Documentation Optional:[id number]}      An electronic signature was used to authenticate this note.    --Caleb Robinsons, MD, MD

## 2021-05-25 ENCOUNTER — Encounter

## 2021-05-25 MED ORDER — IBUPROFEN 800 MG PO TABS
800 MG | ORAL_TABLET | ORAL | 0 refills | Status: DC
Start: 2021-05-25 — End: 2021-07-09

## 2021-05-25 NOTE — Telephone Encounter (Signed)
Last OV 03/26/21. Next OV 06/26/21

## 2021-05-25 NOTE — Care Coordination-Inpatient (Signed)
Ambulatory Care Coordination Note  05/25/2021  CM Risk Score: 1  Charlson 10 Year Mortality Risk Score: 79%     ACC: Tilford Pillar, RN    Summary Note: Pt reports feeling tired. Pt reports maintaining hydration, does own TFs, gets IVFs, Proud to have gained approx 10lbs back. Pt reprots [facility] said he needs to gain weight to have his next surgery. [facility] RN visits once/wk.    Discussed pacing activities, avoid heat.      Pt requests refill for sertraline, will forward request to PCP.     Care Coordination Interventions    Referral from Primary Care Provider: No  Suggested Interventions and Community Resources       Goals Addressed                 This Visit's Progress    ??? Conditions and Symptoms        I will schedule office visits, as directed by my provider.  I will keep my appointment or reschedule if I have to cancel.  I will notify my provider of any barriers to my plan of care.  I will follow my Zone Management tool to seek urgent or emergent care.  I will notify my provider of any symptoms that indicate a worsening of my condition.    Barriers: none  Plan for overcoming my barriers: N/A  Confidence: 10/10  Anticipated Goal Completion Date: 07/25/21                Prior to Admission medications    Medication Sig Start Date End Date Taking? Authorizing Provider   oxyCODONE HCl (OXY-IR) 10 MG immediate release tablet Take 1 tablet by mouth every 6 hours as needed for Pain for up to 30 days. 05/17/21 06/16/21 Yes [name] Crigler, APRN - CNP   albuterol sulfate HFA 108 (90 Base) MCG/ACT inhaler Inhale 2 puffs into the lungs every 6 hours as needed 02/14/21  Yes Ar Automatic Reconciliation   budesonide (PULMICORT) 0.5 MG/2ML nebulizer suspension Inhale 500 mcg into the lungs 2 times daily 02/14/21  Yes Ar Automatic Reconciliation   fluticasone-umeclidin-vilant (TRELEGY ELLIPTA) 100-62.5-25 MCG/INH AEPB 2 inhalation DAILY (route: inhalation) 08/21/20  Yes Ar Automatic Reconciliation   ipratropium-albuterol (DUONEB) 0.5-2.5 (3)  MG/3ML SOLN nebulizer solution Inhale 3 mLs into the lungs every 6 hours as needed 02/14/21  Yes Ar Automatic Reconciliation   ibuprofen (ADVIL;MOTRIN) 800 MG tablet Take 1 tablet by mouth twice daily as needed for pain 05/25/21   Thereasa Distance, MD   chlorhexidine (PERIDEX) 0.12 % solution 10 mL 4 TIMES DAILY (route: mucous membrane) 10/14/20   Ar Automatic Reconciliation   vitamin D 25 MCG (1000 UT) CAPS Take by mouth daily    Ar Automatic Reconciliation   diphenoxylate-atropine (LOMOTIL) 2.5-0.025 MG per tablet Take 1 tablet by mouth 4 times daily as needed. 01/23/21   Ar Automatic Reconciliation   fluticasone (FLONASE) 50 MCG/ACT nasal spray 2 sprays by Nasal route daily 03/26/21   Ar Automatic Reconciliation   gabapentin (NEURONTIN) 100 MG capsule Take 200 mg by mouth 3 times daily as needed. 02/06/21   Ar Automatic Reconciliation   LORazepam (ATIVAN) 0.5 MG tablet Take 0.5 mg by mouth daily. 01/30/21   Ar Automatic Reconciliation   sertraline (ZOLOFT) 20 MG/ML concentrated solution 2.5 mL DAILY (route: oral) 02/06/21   Ar Automatic Reconciliation   traZODone (DESYREL) 50 MG tablet Take 50 mg by mouth 02/06/21   Ar Automatic Reconciliation  Future Appointments   Date Time Provider [place]   06/26/2021  2:15 PM [name], MD FPA [facility] AMB   07/09/2021  8:67 PM SFD CT 64 SLICE UNIT 1 SFDRCT [facility]   07/19/2021 11:20 AM Shella Spearing, MD PPS [facility] AMB   08/03/2021  6:19 PM SFD CT 64 SLICE UNIT 1 SFDRCT SFD   08/06/2021 10:10 AM [facility] [place]   08/06/2021 10:45 AM Rayann Heman, MD [facility] AMB   08/06/2021 10:45 AM Carron Brazen Garey Ham, RD [facility] AMB   08/09/2021  1:00 PM Osa Craver, MD [facility]     ,   COPD Assessment              Symptoms:  COPD associated increased fatigue: Pos      Symptom course: stable  Breathlessness: exertion  Increase use of rapid acting/rescue inhaled medications?: No  Change in chronic cough?: No/At Baseline  Change in sputum?: No/At Baseline  Sputum characteristics:  Unknown  Self Monitoring - SaO2: Yes  Baseline SaO2 Reading: 94      and   General Assessment    Do you have any symptoms that are causing concern?: No

## 2021-05-28 MED ORDER — SERTRALINE HCL 20 MG/ML PO CONC
20 MG/ML | Freq: Every day | ORAL | 5 refills | Status: AC
Start: 2021-05-28 — End: ?

## 2021-05-28 NOTE — Addendum Note (Signed)
Addended by: [name] on: 05/28/2021 08:06 AM     Modules accepted: Orders

## 2021-05-29 ENCOUNTER — Inpatient Hospital Stay: Admit: 2021-05-29 | Payer: MEDICARE | Primary: Family Medicine

## 2021-05-29 ENCOUNTER — Inpatient Hospital Stay
Admit: 2021-05-29 | Discharge: 2021-06-04 | Disposition: A | Discharge status: Home / Self Care | Payer: MEDICARE | Admitting: Internal Medicine

## 2021-05-29 DIAGNOSIS — J690 Pneumonitis due to inhalation of food and vomit: Principal | ICD-10-CM

## 2021-05-29 LAB — COVID-19, RAPID: SARS-CoV-2, Rapid: NOT DETECTED

## 2021-05-29 LAB — LACTIC ACID: Lactic Acid, Plasma: 2.1 MMOL/L — ABNORMAL HIGH (ref 0.4–2.0)

## 2021-05-29 NOTE — ED Triage Notes (Signed)
Pt arrives via [place] to triage. States started to feel bad last night. States diarrhea. Fever today at doctors office. Has PICC line for IV fluids. Hx of tongue cancer. Not currently undergoing any treatment. NAD.  Masked.

## 2021-05-29 NOTE — H&P (Signed)
Hospitalist History and Physical   Admit Date:  05/29/2021  6:36 PM   Name:  Caleb Robinson   Age:  70 y.o.  Sex:  male  DOB:  02/23/1951   MRN:  [id number]   Room:  ER32/32    Presenting Complaint: Fatigue     Reason(s) for Admission: Acute respiratory failure with hypoxemia ([place]) [J96.01]     History of Present Illness:   Caleb Robinson is a 70 y.o. 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 The pt has undergone tongue and post pharyngeal resection and is currently in remission per his account. He follows at [facility]. The pt has had severe Protein cal malnutrition and follows with PMD. While there today, he was noted to have fever > 101. The pt admits to increased SOB with chills but states that he did not take his temperature at home. The pt also c/o productive sputum but states that he has been "swallowing" it as he is unable to expectorate without a tongue. The pt admits to L sided CP with deep inspiration. He denies any N, V, diarrhea, dysuria or HA. The pt was brought to the ED where he was started on IV abx and referred to Hospitalist's svc for further eval.     Review of Systems:  10 systems reviewed and negative except as noted in HPI.  Assessment & Plan:     Principal Problem:    Acute respiratory failure with hypoxemia (HCC)      IMP:    1.) Acute Hypoxic Resp Failure - due to asp pneumonia and inability to manipulate oropharyngeal secretions. ? Complications from severe debility and muscle wasting. CK CT Chest, ? PTE.    2.) Aspiration Pneumonia - due to above, ck sputum, Pulm for ? 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 *       Past History:     Past Medical History:   Diagnosis Date   ??? Chronic obstructive pulmonary disease ([place])     follows with Pulmonology.  daily and rescure inhaler. no home O2.   ??? Emphysema lung ([place])    ??? HTN (hypertension)    ??? Tongue cancer [facility])      Past Surgical History:   Procedure Laterality Date   ??? HERNIA REPAIR  05/2019      No Known Allergies  Social History     Tobacco Use   ??? Smoking status: Current Every Day Smoker     Packs/day: 0.75   ??? Smokeless tobacco: Never Used   ??? Tobacco comment: Quit smoking: stopped but restarted Dec 2019   Substance Use Topics   ??? Alcohol use: Yes     Alcohol/week: 1.0 standard drink      Family History   Problem Relation Age of Onset   ??? Dementia Mother       Family history reviewed and negative except as noted above.      Immunization History   Administered Date(s) Administered   ??? Influenza Virus Vaccine 12/23/2018   ??? PPD Test 04/14/2021     Prior to Admit Medications:  Current Outpatient Medications   Medication Instructions   ??? albuterol sulfate HFA 108 (90 Base) MCG/ACT inhaler 2 puffs, Inhalation, EVERY 6 HOURS PRN   ??? budesonide (PULMICORT) 500 mcg, Inhalation, 2 TIMES DAILY   ??? chlorhexidine (PERIDEX) 0.12 % solution 10 mL 4 TIMES DAILY (route: mucous membrane)   ??? diphenoxylate-atropine (LOMOTIL) 2.5-0.025 MG per tablet 1 tablet, Oral, 4 TIMES DAILY PRN   ??? fluticasone (FLONASE) 50 MCG/ACT nasal spray 2 sprays, Nasal, DAILY   ??? fluticasone-umeclidin-vilant (TRELEGY ELLIPTA) 100-62.5-25 MCG/INH AEPB 2 inhalation DAILY (route: inhalation)   ??? gabapentin  (NEURONTIN) 200 mg, Oral, 3 TIMES DAILY PRN   ??? ibuprofen (ADVIL;MOTRIN) 800 MG tablet Take 1 tablet by mouth twice daily as needed for pain   ??? ipratropium-albuterol (DUONEB) 0.5-2.5 (3) MG/3ML SOLN nebulizer solution 3 mLs, Inhalation, EVERY 6 HOURS PRN   ??? LORazepam (ATIVAN) 0.5 mg, Oral, DAILY   ??? oxyCODONE HCl (OXY-IR) 10 mg, Oral, EVERY 6 HOURS PRN   ??? sertraline (ZOLOFT) 50 mg, Oral, DAILY, 2.5 mL DAILY (route: oral)   ??? traZODone (DESYREL) 50 mg, Oral   ??? vitamin D 25 MCG (1000 UT) CAPS Oral, DAILY         Objective:     Patient Vitals for the past 24 hrs:   Temp Pulse Resp BP SpO2   05/29/21 2238 -- 67 18 102/62 97 %   05/29/21 1630 98.3 ??F (36.8 ??C) 80 18 101/67 98 %       Oxygen Therapy  SpO2: 97 %  O2 Device: None (Room air)    Estimated body mass index is 15.45 kg/m?? as calculated from the following:    Height as of this encounter: 5' 4" (1.626 m).    Weight as of this encounter: 90 lb (40.8 kg).  No intake or output data in the 24 hours ending 05/29/21 2245      Physical Exam:  Pt seen and evaluated.   Blood pressure 102/62, pulse 67, temperature 98.3 ??F (36.8 ??C), temperature source Oral, resp. rate 18, height 5' 4" (1.626 m), weight 90 lb (40.8 kg), SpO2 97 %.  General:    Cachectic appearing, ill.  Head:  Normocephalic, atraumatic  Eyes:  Sclerae appear normal.  Pupils equally round.  ENT:  Resected tongue with dark green secretions and drooling   Neck:  No restricted ROM.  Trachea midline   CV:   RRR.  No m/r/g.  No jugular venous distension.  Lungs:   Diminished with scattered rhonchi.  Respirations labored  Abdomen:   Bowel sounds present.  Soft, nontender, nondistended, + PEG.  Extremities: No cyanosis or clubbing.  No edema  Skin:     No rashes and normal coloration.   Warm and dry.  Neuro:  CN II-XII grossly intact.  Sensation intact.  A&Ox3  Psych:  Anxious     I have reviewed ordered lab tests and independently visualized imaging below:    Last 24hr Labs:  Recent Results (from the past 24  hour(s))   CBC with Auto Differential    Collection Time: 05/29/21  4:43 PM   Result Value Ref Range    WBC 16.7 (H) 4.3 - 11.1 K/uL    RBC 3.16 (L) 4.23 - 5.6 M/uL    Hemoglobin 9.8 (L) 13.6 - 17.2 g/dL    Hematocrit 31.1 (L) 41.1 - 50.3 %    MCV 98.4 (H) 79.6 - 97.8 FL    MCH 31.0 26.1 - 32.9 PG    MCHC 31.5 31.4 - 35.0 g/dL    RDW 16.5 (H) 11.9 - 14.6 %    Platelets 231 150 - 450 K/uL    MPV 10.8 9.4 - 12.3 FL    nRBC 0.00 0.0 - 0.2 K/uL    Seg Neutrophils 90 (H) 43 - 78 %    Lymphocytes 3 (L) 13 - 44 %    Monocytes 5 4.0 - 12.0 %    Eosinophils % 0 (L) 0.5 - 7.8 %    Basophils 1 0.0 - 2.0 %    Immature Granulocytes 1 0.0 - 5.0 %    Segs Absolute 15.0 (H) 1.7 - 8.2 K/UL    Absolute Lymph # 0.5 0.5 - 4.6 K/UL    Absolute Mono # 0.8 0.1 - 1.3 K/UL    Absolute Eos # 0.0 0.0 - 0.8 K/UL    Basophils Absolute 0.2 0.0 - 0.2 K/UL    Absolute Immature Granulocyte 0.2 0.0 - 0.5 K/UL    RBC Comment SLIGHT  ANISOCYTOSIS + POIKILOCYTOSIS        WBC Comment Result Confirmed By Smear      Platelet Comment ADEQUATE      Differential Type AUTOMATED     Lactic Acid    Collection Time: 05/29/21  4:43 PM   Result Value Ref Range    Lactic Acid, Plasma 2.1 (H) 0.4 - 2.0 MMOL/L   COVID-19, Rapid    Collection Time: 05/29/21  4:43 PM    Specimen: Nasopharyngeal   Result Value Ref Range    Source NASAL      SARS-CoV-2, Rapid Not detected NOTD     Comprehensive Metabolic Panel    Collection Time: 05/29/21  7:45 PM   Result Value Ref Range    Sodium 141 138 - 145 mmol/L    Potassium 4.4 3.5 - 5.1 mmol/L    Chloride 104 98 - 107 mmol/L    CO2 31 21 - 32 mmol/L    Anion Gap 6 (L) 7 - 16 mmol/L    Glucose 89 65 - 100 mg/dL    BUN 33 (H) 8 - 23 MG/DL    CREATININE 0.80 0.8 - 1.5 MG/DL    GFR African American >60 >60 ml/min/1.59m    GFR Non-African American >60 >60 ml/min/1.743m   Calcium 9.2 8.3 - 10.4 MG/DL    Total Bilirubin 0.4 0.2 - 1.1 MG/DL    ALT 25 12 - 65 U/L    AST 41 (H) 15 - 37 U/L    Alk Phosphatase 115 50 - 136 U/L    Total  Protein 6.4 6.3 - 8.2 g/dL    Albumin 2.6 (L) 3.2 - 4.6 g/dL    Globulin 3.8 (H) 2.3 - 3.5  g/dL    Albumin/Globulin Ratio 0.7 (L) 1.2 - 3.5     Procalcitonin    Collection Time: 05/29/21  7:45 PM   Result Value Ref Range    Procalcitonin 31.02 (H) 0.00 - 0.49 ng/mL       Other Studies:  XR CHEST (2 VW)    Result Date: 05/29/2021  Chest X-ray INDICATION: Fever PA and lateral views of the chest were obtained. FINDINGS: There is increased infiltrate in both lung bases..  The heart size is normal.  PICC line is present.      Increased bibasilar infiltrate       Echocardiogram:  No results found for this or any previous visit.      Meds previously ordered:  Orders Placed This Encounter   Medications   ??? 0.9 % sodium chloride bolus   ??? DISCONTD: vancomycin (VANCOCIN) 1250 mg in sodium chloride 0.9% 250 mL IVPB     Order Specific Question:   Antimicrobial Indications     Answer:   Pneumonia (HAP)   ??? piperacillin-tazobactam (ZOSYN) 3,375 mg in sodium chloride 0.9 % 50 mL IVPB (mini-bag)     Order Specific Question:   Antimicrobial Indications     Answer:   Pneumonia ([facility]AP)   ??? azithromycin (ZITHROMAX) 500 mg in sodium chloride 0.9 % 250 mL IVPB (Vial2Bag)     Order Specific Question:   Antimicrobial Indications     Answer:   Pneumonia ([facility]AP)   ??? vancomycin (VANCOCIN) 1,000 mg in sodium chloride 0.9 % 250 mL IVPB (Vial2Bag)     Order Specific Question:   Antimicrobial Indications     Answer:   Pneumonia (HAP)       Blood Product Consent:  I have discussed with the patient the rationale for blood component transfusion; its benefits in treating or preventing fatigue, organ damage, or death; and its risk which includes mild transfusion reactions, rare risk of blood borne infection, or more serious but rare reactions. I have discussed the alternatives to transfusion, including the risk and consequences of not receiving transfusion. The patient had an opportunity to ask questions and had agreed to proceed with transfusion of blood  components.    Signed:      Gildardo Griffes. Arnette Norris, MD, [place]  Internal Medicine - Hospitalist    Part of this note may have been written by using a voice dictation software.  The note has been proof read but may still contain some grammatical/other typographical errors.

## 2021-05-29 NOTE — ED Provider Notes (Signed)
[facility] Emergency Department Provider Note                   PCP:                [name], MD, MD               Age: 70 y.o.      Sex: male       ICD-10-CM    1. Pneumonia of both lower lobes due to infectious organism  J18.9    2. Hypoxia  R09.02        DISPOSITION Decision To Admit 05/29/2021 09:54:05 PM       New Prescriptions    No medications on file       Orders Placed This Encounter   Procedures   ??? COVID-19, Rapid   ??? Culture, Blood 1   ??? Culture, Blood 1   ??? XR CHEST (2 VW)   ??? CBC with Auto Differential   ??? Lactic Acid   ??? Procalcitonin   ??? POCT Urine Dipstick   ??? Insert peripheral IV        Renata Caprice, DO 9:54 PM      MDM  Number of Diagnoses or Management Options  Hypoxia  Pneumonia of both lower lobes due to infectious organism  Diagnosis management comments: 70 year old male presents to the emergency department with family bedside.  Sent from primary care physician's office with concern for pneumonia.  Patient was febrile at his primary care physician's office, today labs show basilar infiltrates with white count of 16,000 on lab work.  Patient also is hypoxic down to the high 80s, 88% at rest on my evaluation.  Placed on 2 L supplemental oxygen with improvement of his hypoxia.  Given his recent hospitalization will cover for healthcare associated pneumonia with Zosyn, vancomycin and azithromycin.  No other emergent findings on patient's lab work.  Patient also given 1 L bolus IV fluid.  Admitted to hospitalist for continued evaluation and treatment.  Patient and family voiced understanding and agreement with this plan.       Amount and/or Complexity of Data Reviewed  Clinical lab tests: ordered and reviewed  Tests in the radiology section of CPT??: ordered and reviewed  Discussion of test results with the performing providers: yes  Decide to obtain previous medical records or to obtain history from someone other than the patient: yes  Review and summarize past medical records: yes  Discuss  the patient with other providers: yes         Caleb Robinson is a 70 y.o. male who presents to the Emergency Department with chief complaint of    Chief Complaint   Patient presents with   ??? Fatigue      70 year old male history of COPD as well as prior throat and tongue cancer.  Patient presents to the emergency department with increased cough for the last 4 days, wet cough but he states he does not cough up anything states he swallows his sputum.  Fever today at his primary care physician's office with a temperature 101.  Reports generalized fatigue as well along with his shortness of breath.  Denies being on steroids recently.  Reports he has been increasingly short of breath as well.  Denies nausea.  Has            Review of Systems   Constitutional: Positive for chills and fatigue. Negative for fever.   HENT: Negative for congestion and  sinus pain.    Eyes: Negative for photophobia.   Respiratory: Positive for cough and shortness of breath.    Cardiovascular: Negative for chest pain.   Gastrointestinal: Negative for abdominal pain, diarrhea, nausea and vomiting.   Genitourinary: Negative for difficulty urinating.   Musculoskeletal: Negative for back pain.   Skin: Negative for rash.   Neurological: Negative for syncope and headaches.   Psychiatric/Behavioral: Negative for confusion.   All other systems reviewed and are negative.      Past Medical History:   Diagnosis Date   ??? Chronic obstructive pulmonary disease ([place])     follows with Pulmonology.  daily and rescure inhaler. no home O2.   ??? Emphysema lung ([place])    ??? HTN (hypertension)    ??? Tongue cancer [facility])         Past Surgical History:   Procedure Laterality Date   ??? HERNIA REPAIR  05/2019        Family History   Problem Relation Age of Onset   ??? Dementia Mother            Social Connections:    ??? Frequency of Communication with Friends and Family: Not on file   ??? Frequency of Social Gatherings with Friends and Family: Not on file   ??? Attends Religious  Services: Not on file   ??? Active Member of Clubs or Organizations: Not on file   ??? Attends Archivist Meetings: Not on file   ??? Marital Status: Not on file        No Known Allergies     Vitals signs and nursing note reviewed.   No data found.       Physical Exam  Vitals and nursing note reviewed.   Constitutional:       Appearance: Normal appearance. He is ill-appearing.   HENT:      Head: Normocephalic.      Nose: Nose normal.      Mouth/Throat:      Mouth: Mucous membranes are moist.      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   Cardiovascular:      Rate and Rhythm: Normal rate and regular rhythm.      Pulses: Normal pulses.      Heart sounds: Normal heart sounds.   Pulmonary:      Effort: Pulmonary effort is normal. No respiratory distress.      Breath sounds: Rhonchi present.   Abdominal:      General: Abdomen is flat.      Palpations: Abdomen is soft.      Tenderness: There is no abdominal tenderness.      Comments: G-tube in place, nontender.   Musculoskeletal:         General: No swelling. Normal range of motion.      Cervical back: Normal range of motion. No rigidity.   Skin:     General: Skin is warm.      Capillary Refill: Capillary refill takes less than 2 seconds.      Findings: No rash.   Neurological:      General: No focal deficit present.      Mental Status: He is alert and oriented to person, place, and time.   Psychiatric:         Mood and Affect: Mood normal.          Critical Care  Performed by: Alroy Dust  Tonia Ghent, DO  Authorized by: Renata Caprice, DO     Critical care provider statement:     Critical care time (minutes):  36    Critical care time was exclusive of:  Separately billable procedures and treating other patients    Critical care was necessary to treat or prevent imminent or life-threatening deterioration of the following conditions:  Sepsis (Septic from pneumonia requiring fluid  as well as antibiotic administration)    Critical care was time spent personally by me on the following activities:  Development of treatment plan with patient or surrogate, discussions with consultants, evaluation of patient's response to treatment, examination of patient, obtaining history from patient or surrogate, ordering and performing treatments and interventions, ordering and review of laboratory studies, ordering and review of radiographic studies, pulse oximetry, re-evaluation of patient's condition and review of old charts          Labs Reviewed   CBC WITH AUTO DIFFERENTIAL - Abnormal; Notable for the following components:       Result Value    WBC 16.7 (*)     RBC 3.16 (*)     Hemoglobin 9.8 (*)     Hematocrit 31.1 (*)     MCV 98.4 (*)     RDW 16.5 (*)     Seg Neutrophils 90 (*)     Lymphocytes 3 (*)     Eosinophils % 0 (*)     Segs Absolute 15.0 (*)     All other components within normal limits   LACTIC ACID - Abnormal; Notable for the following components:    Lactic Acid, Plasma 2.1 (*)     All other components within normal limits   COVID-19, RAPID   CULTURE, BLOOD 1   CULTURE, BLOOD 1   PROCALCITONIN        XR CHEST (2 VW)   Final Result   Increased bibasilar infiltrate                                Voice dictation software was used during the making of this note.  This software is not perfect and grammatical and other typographical errors may be present.  This note has not been completely proofread for errors.       Renata Caprice, DO  05/29/21 2158

## 2021-05-30 ENCOUNTER — Inpatient Hospital Stay: Admit: 2021-05-30 | Payer: MEDICARE | Primary: Family Medicine

## 2021-05-30 DIAGNOSIS — J690 Pneumonitis due to inhalation of food and vomit: Secondary | ICD-10-CM

## 2021-05-30 LAB — TROPONIN
Troponin, High Sensitivity: 14.2 pg/mL — ABNORMAL HIGH (ref 0–14)
Troponin, High Sensitivity: 11 pg/mL (ref 0–14)

## 2021-05-30 LAB — CBC WITH AUTO DIFFERENTIAL
Absolute Eos #: 0 10*3/uL (ref 0.0–0.8)
Absolute Eos #: 0 10*3/uL (ref 0.0–0.8)
Absolute Immature Granulocyte: 0.2 10*3/uL (ref 0.0–0.5)
Absolute Immature Granulocyte: 0.2 10*3/uL (ref 0.0–0.5)
Absolute Lymph #: 0.5 10*3/uL (ref 0.5–4.6)
Absolute Lymph #: 0.7 10*3/uL (ref 0.5–4.6)
Absolute Mono #: 0.8 10*3/uL (ref 0.1–1.3)
Absolute Mono #: 0.9 10*3/uL (ref 0.1–1.3)
Basophils Absolute: 0.2 10*3/uL (ref 0.0–0.2)
Basophils Absolute: 0.1 10*3/uL (ref 0.0–0.2)
Basophils: 1 % (ref 0.0–2.0)
Basophils: 0 % (ref 0.0–2.0)
Eosinophils %: 0 % — ABNORMAL LOW (ref 0.5–7.8)
Eosinophils %: 0 % — ABNORMAL LOW (ref 0.5–7.8)
Hematocrit: 31.1 % — ABNORMAL LOW (ref 41.1–50.3)
Hematocrit: 28.8 % — ABNORMAL LOW (ref 41.1–50.3)
Hemoglobin: 9.8 g/dL — ABNORMAL LOW (ref 13.6–17.2)
Hemoglobin: 9.1 g/dL — ABNORMAL LOW (ref 13.6–17.2)
Immature Granulocytes: 1 % (ref 0.0–5.0)
Immature Granulocytes: 1 % (ref 0.0–5.0)
Lymphocytes: 3 % — ABNORMAL LOW (ref 13–44)
Lymphocytes: 5 % — ABNORMAL LOW (ref 13–44)
MCH: 31 PG (ref 26.1–32.9)
MCH: 30.6 PG (ref 26.1–32.9)
MCHC: 31.5 g/dL (ref 31.4–35.0)
MCHC: 31.6 g/dL (ref 31.4–35.0)
MCV: 98.4 FL — ABNORMAL HIGH (ref 79.6–97.8)
MCV: 97 FL (ref 79.6–97.8)
MPV: 10.8 FL (ref 9.4–12.3)
MPV: 11 FL (ref 9.4–12.3)
Monocytes: 5 % (ref 4.0–12.0)
Monocytes: 6 % (ref 4.0–12.0)
Platelet Comment: ADEQUATE
Platelets: 231 10*3/uL (ref 150–450)
Platelets: 249 10*3/uL (ref 150–450)
RBC: 3.16 M/uL — ABNORMAL LOW (ref 4.23–5.6)
RBC: 2.97 M/uL — ABNORMAL LOW (ref 4.23–5.6)
RDW: 16.5 % — ABNORMAL HIGH (ref 11.9–14.6)
RDW: 16.7 % — ABNORMAL HIGH (ref 11.9–14.6)
Seg Neutrophils: 90 % — ABNORMAL HIGH (ref 43–78)
Seg Neutrophils: 88 % — ABNORMAL HIGH (ref 43–78)
Segs Absolute: 15 10*3/uL — ABNORMAL HIGH (ref 1.7–8.2)
Segs Absolute: 13.3 10*3/uL — ABNORMAL HIGH (ref 1.7–8.2)
WBC: 16.7 10*3/uL — ABNORMAL HIGH (ref 4.3–11.1)
WBC: 15.2 10*3/uL — ABNORMAL HIGH (ref 4.3–11.1)
nRBC: 0 10*3/uL (ref 0.0–0.2)
nRBC: 0 10*3/uL (ref 0.0–0.2)

## 2021-05-30 LAB — URINALYSIS
BACTERIA, URINE: NEGATIVE /hpf — AB
Bilirubin Urine: NEGATIVE
Blood, Urine: NEGATIVE
Glucose, UA: NEGATIVE mg/dL
Ketones, Urine: 15 mg/dL — AB
Leukocyte Esterase, Urine: NEGATIVE
Nitrite, Urine: NEGATIVE
Specific Gravity, UA: 1.028 — ABNORMAL HIGH (ref 1.001–1.023)
Urobilinogen, Urine: 1 EU/dL (ref 0.2–1.0)
pH, Urine: 6.5 (ref 5.0–9.0)

## 2021-05-30 LAB — COMPREHENSIVE METABOLIC PANEL W/ REFLEX TO MG FOR LOW K
ALT: 22 U/L (ref 12–65)
AST: 34 U/L (ref 15–37)
Albumin/Globulin Ratio: 0.7 — ABNORMAL LOW (ref 1.2–3.5)
Albumin: 2.2 g/dL — ABNORMAL LOW (ref 3.2–4.6)
Alk Phosphatase: 83 U/L (ref 50–136)
Anion Gap: 5 mmol/L — ABNORMAL LOW (ref 7–16)
BUN: 31 MG/DL — ABNORMAL HIGH (ref 8–23)
CO2: 31 mmol/L (ref 21–32)
Calcium: 8.5 MG/DL (ref 8.3–10.4)
Chloride: 108 mmol/L — ABNORMAL HIGH (ref 98–107)
Creatinine: 0.7 MG/DL — ABNORMAL LOW (ref 0.8–1.5)
GFR African American: >60 mL/min/{1.73_m2} (ref >60)
GFR Non-African American: >60 mL/min/{1.73_m2} (ref >60)
Globulin: 3.3 g/dL (ref 2.3–3.5)
Glucose: 73 mg/dL (ref 65–100)
Potassium: 4.1 mmol/L (ref 3.5–5.1)
Sodium: 144 mmol/L (ref 138–145)
Total Bilirubin: 0.3 MG/DL (ref 0.2–1.1)
Total Protein: 5.5 g/dL — ABNORMAL LOW (ref 6.3–8.2)

## 2021-05-30 LAB — HEPATIC FUNCTION PANEL
ALT: 22 U/L (ref 12–65)
AST: 34 U/L (ref 15–37)
Albumin/Globulin Ratio: 0.7 — ABNORMAL LOW (ref 1.2–3.5)
Albumin: 2.2 g/dL — ABNORMAL LOW (ref 3.2–4.6)
Alk Phosphatase: 83 U/L (ref 50–136)
Bilirubin, Direct: 0.1 MG/DL (ref <0.4)
Globulin: 3.2 g/dL (ref 2.3–3.5)
Total Bilirubin: 0.3 MG/DL (ref 0.2–1.1)
Total Protein: 5.4 g/dL — ABNORMAL LOW (ref 6.3–8.2)

## 2021-05-30 LAB — EKG 12-LEAD
Atrial Rate: 56 {beats}/min
P Axis: 71 degrees
P-R Interval: 128 ms
Q-T Interval: 464 ms
QRS Duration: 92 ms
QTc Calculation (Bazett): 447 ms
R Axis: 81 degrees
T Axis: 63 degrees
Ventricular Rate: 56 {beats}/min

## 2021-05-30 LAB — TRANSFERRIN SATURATION
Iron: 12 ug/dL — ABNORMAL LOW (ref 35–150)
TIBC: 227 ug/dL — ABNORMAL LOW (ref 250–450)
TRANSFERRIN SATURATION: 5 % — ABNORMAL LOW (ref >20)

## 2021-05-30 LAB — POCT URINALYSIS DIPSTICK
Bilirubin, Urine, POC: NEGATIVE
Blood, UA POC: NEGATIVE
Glucose, UA POC: NEGATIVE mg/dL
Ketones, Urine, POC: 15 mg/dL — AB
Leukocyte Est, UA POC: NEGATIVE
Nitrite, Urine, POC: NEGATIVE
Protein, Urine, POC: NEGATIVE mg/dL
Specific Gravity, Urine, POC: 1.02 (ref 1.001–1.023)
URINE UROBILINOGEN POC: 0.2 EU/dL (ref 0.2–1.0)
pH, Urine, POC: 6.5 (ref 5.0–9.0)

## 2021-05-30 LAB — COMPREHENSIVE METABOLIC PANEL
ALT: 25 U/L (ref 12–65)
AST: 41 U/L — ABNORMAL HIGH (ref 15–37)
Albumin/Globulin Ratio: 0.7 — ABNORMAL LOW (ref 1.2–3.5)
Albumin: 2.6 g/dL — ABNORMAL LOW (ref 3.2–4.6)
Alk Phosphatase: 115 U/L (ref 50–136)
Anion Gap: 6 mmol/L — ABNORMAL LOW (ref 7–16)
BUN: 33 MG/DL — ABNORMAL HIGH (ref 8–23)
CO2: 31 mmol/L (ref 21–32)
Calcium: 9.2 MG/DL (ref 8.3–10.4)
Chloride: 104 mmol/L (ref 98–107)
Creatinine: 0.8 MG/DL (ref 0.8–1.5)
GFR African American: >60 mL/min/{1.73_m2} (ref >60)
GFR Non-African American: >60 mL/min/{1.73_m2} (ref >60)
Globulin: 3.8 g/dL — ABNORMAL HIGH (ref 2.3–3.5)
Glucose: 89 mg/dL (ref 65–100)
Potassium: 4.4 mmol/L (ref 3.5–5.1)
Sodium: 141 mmol/L (ref 138–145)
Total Bilirubin: 0.4 MG/DL (ref 0.2–1.1)
Total Protein: 6.4 g/dL (ref 6.3–8.2)

## 2021-05-30 LAB — RESPIRATORY PANEL, MOLECULAR, WITH COVID-19

## 2021-05-30 LAB — LACTIC ACID
Lactic Acid, Plasma: 0.6 MMOL/L (ref 0.4–2.0)
Lactic Acid, Plasma: 0.7 MMOL/L (ref 0.4–2.0)

## 2021-05-30 LAB — VITAMIN B12: Vitamin B-12: 1381 pg/mL — ABNORMAL HIGH (ref 193–986)

## 2021-05-30 LAB — FOLATE: Folate: 32 ng/mL — ABNORMAL HIGH (ref 3.1–17.5)

## 2021-05-30 LAB — FERRITIN: Ferritin: 101 NG/ML (ref 8–388)

## 2021-05-30 LAB — PROCALCITONIN: Procalcitonin: 31.02 ng/mL — ABNORMAL HIGH (ref 0.00–0.49)

## 2021-05-30 MED ORDER — SODIUM CHLORIDE 0.9 % IV BOLUS
0.9 % | Freq: Once | INTRAVENOUS | Status: AC
Start: 2021-05-30 — End: 2021-05-30
  Administered 2021-05-30: 09:00:00 500 mL via INTRAVENOUS

## 2021-05-30 MED ORDER — ONDANSETRON 4 MG PO TBDP
4 MG | Freq: Three times a day (TID) | ORAL | Status: DC | PRN
Start: 2021-05-30 — End: 2021-06-04

## 2021-05-30 MED ORDER — PIPERACILLIN SOD-TAZOBACTAM SO 3.375 (3-0.375) G IV SOLR
3.3753-0.375 (3-0.375) g | INTRAVENOUS | Status: AC
Start: 2021-05-30 — End: 2021-05-29
  Administered 2021-05-30: 02:00:00 3375 mg via INTRAVENOUS

## 2021-05-30 MED ORDER — FLUTICASONE-UMECLIDIN-VILANT 100-62.5-25 MCG/INH IN AEPB
100-62.5-25 MCG/INH | Freq: Every day | RESPIRATORY_TRACT | Status: DC
Start: 2021-05-30 — End: 2021-06-04
  Administered 2021-05-30: 13:00:00 1 via RESPIRATORY_TRACT

## 2021-05-30 MED ORDER — SERTRALINE HCL 20 MG/ML PO CONC
20 MG/ML | Freq: Every day | ORAL | Status: DC
Start: 2021-05-30 — End: 2021-06-04
  Administered 2021-06-02 – 2021-06-03 (×2): 50 mg via ORAL

## 2021-05-30 MED ORDER — FLUMAZENIL 1 MG/10ML IV SOLN
110 MG/0ML | INTRAVENOUS | Status: DC | PRN
Start: 2021-05-30 — End: 2021-05-30

## 2021-05-30 MED ORDER — SODIUM CHLORIDE 0.9 % IV SOLN
0.9 % | Freq: Once | INTRAVENOUS | Status: AC
Start: 2021-05-30 — End: 2021-05-30
  Administered 2021-05-30: 03:00:00 500 mg via INTRAVENOUS

## 2021-05-30 MED ORDER — DIPHENOXYLATE-ATROPINE 2.5-0.025 MG PO TABS
Freq: Four times a day (QID) | ORAL | Status: DC | PRN
Start: 2021-05-30 — End: 2021-06-04

## 2021-05-30 MED ORDER — NICOTINE 21 MG/24HR TD PT24
2124 MG/24HR | Freq: Every day | TRANSDERMAL | Status: DC
Start: 2021-05-30 — End: 2021-06-04
  Administered 2021-05-30 – 2021-06-04 (×6): 1 via TRANSDERMAL

## 2021-05-30 MED ORDER — FLUTICASONE PROPIONATE 50 MCG/ACT NA SUSP
50 MCG/ACT | Freq: Every day | NASAL | Status: DC
Start: 2021-05-30 — End: 2021-06-04
  Administered 2021-05-30 – 2021-06-04 (×6): 2 via NASAL

## 2021-05-30 MED ORDER — VANCOMYCIN 1250 MG IN NS 250 ML (PREMIX) IVPB
Freq: Once | Status: DC
Start: 2021-05-30 — End: 2021-05-29

## 2021-05-30 MED ORDER — GABAPENTIN 100 MG PO CAPS
100 MG | Freq: Three times a day (TID) | ORAL | Status: DC
Start: 2021-05-30 — End: 2021-06-04
  Administered 2021-05-31 – 2021-06-04 (×11): 200 mg via ORAL

## 2021-05-30 MED ORDER — SODIUM CHLORIDE 0.9 % IV SOLN
0.9 % | Freq: Once | INTRAVENOUS | Status: AC
Start: 2021-05-30 — End: 2021-05-30
  Administered 2021-05-30: 08:00:00 1000 mg via INTRAVENOUS

## 2021-05-30 MED ORDER — ACETAMINOPHEN 650 MG RE SUPP
650 MG | Freq: Four times a day (QID) | RECTAL | Status: DC | PRN
Start: 2021-05-30 — End: 2021-06-04

## 2021-05-30 MED ORDER — TRAZODONE HCL 50 MG PO TABS
50 MG | Freq: Every evening | ORAL | Status: DC | PRN
Start: 2021-05-30 — End: 2021-06-04
  Administered 2021-06-01: 03:00:00 50 mg via ORAL

## 2021-05-30 MED ORDER — POLYETHYLENE GLYCOL 3350 17 G PO PACK
17 g | Freq: Every day | ORAL | Status: DC | PRN
Start: 2021-05-30 — End: 2021-06-04

## 2021-05-30 MED ORDER — LORAZEPAM 0.5 MG PO TABS
0.5 MG | Freq: Every day | ORAL | Status: DC
Start: 2021-05-30 — End: 2021-06-04
  Administered 2021-05-31 – 2021-06-04 (×5): 0.5 mg via ORAL

## 2021-05-30 MED ORDER — FENTANYL 0.05 MG/ML SOLN (MIXTURES ONLY)
Status: DC | PRN
Start: 2021-05-30 — End: 2021-05-30
  Administered 2021-05-30: 18:00:00 50 ug via INTRAVENOUS
  Administered 2021-05-30: 18:00:00 25 ug via INTRAVENOUS

## 2021-05-30 MED ORDER — VITAMIN D 25 MCG (1000 UT) PO TABS
25 MCG (1000 UT) | Freq: Every day | ORAL | Status: DC
Start: 2021-05-30 — End: 2021-06-04
  Administered 2021-05-31 – 2021-06-04 (×5): 1000 [IU] via ORAL

## 2021-05-30 MED ORDER — SODIUM CHLORIDE 0.9 % IV SOLN
0.9 | INTRAVENOUS | Status: DC | PRN
Start: 2021-05-30 — End: 2021-06-04

## 2021-05-30 MED ORDER — MIDAZOLAM HCL (PF) 2 MG/2ML IJ SOLN
2 MG/ML | INTRAMUSCULAR | Status: DC | PRN
Start: 2021-05-30 — End: 2021-05-30
  Administered 2021-05-30: 18:00:00 1 mg via INTRAVENOUS

## 2021-05-30 MED ORDER — ACETAMINOPHEN 325 MG PO TABS
325 MG | Freq: Four times a day (QID) | ORAL | Status: DC | PRN
Start: 2021-05-30 — End: 2021-06-04

## 2021-05-30 MED ORDER — LIDOCAINE HCL 4 % EX SOLN
4 % | Freq: Once | CUTANEOUS | Status: AC
Start: 2021-05-30 — End: 2021-05-30
  Administered 2021-05-30: 18:00:00 via TOPICAL

## 2021-05-30 MED ORDER — ENOXAPARIN SODIUM 30 MG/0.3ML IJ SOSY
30 MG/0.3ML | INTRAMUSCULAR | Status: DC
Start: 2021-05-30 — End: 2021-06-04
  Administered 2021-05-31 – 2021-06-04 (×5): 30 mg via SUBCUTANEOUS

## 2021-05-30 MED ORDER — BUDESONIDE 0.5 MG/2ML IN SUSP
0.5 MG/2ML | Freq: Two times a day (BID) | RESPIRATORY_TRACT | Status: DC
Start: 2021-05-30 — End: 2021-05-30
  Administered 2021-05-30: 13:00:00 500 ug via RESPIRATORY_TRACT

## 2021-05-30 MED ORDER — MAGNESIUM SULFATE 2000 MG/50 ML IVPB PREMIX
2 GM/50ML | INTRAVENOUS | Status: DC | PRN
Start: 2021-05-30 — End: 2021-06-04

## 2021-05-30 MED ORDER — LIDOCAINE VISCOUS HCL 2 % MT SOLN
2 % | OROMUCOSAL | Status: DC | PRN
Start: 2021-05-30 — End: 2021-06-04
  Administered 2021-05-31 – 2021-06-03 (×5): 15 mL via OROMUCOSAL

## 2021-05-30 MED ORDER — ONDANSETRON HCL 4 MG/2ML IJ SOLN
4 MG/2ML | Freq: Four times a day (QID) | INTRAMUSCULAR | Status: DC | PRN
Start: 2021-05-30 — End: 2021-06-04

## 2021-05-30 MED ORDER — NORMAL SALINE FLUSH 0.9 % IV SOLN
0.9 % | INTRAVENOUS | Status: DC | PRN
Start: 2021-05-30 — End: 2021-06-04

## 2021-05-30 MED ORDER — ALBUTEROL SULFATE HFA 108 (90 BASE) MCG/ACT IN AERS
10890 (90 Base) MCG/ACT | Freq: Four times a day (QID) | RESPIRATORY_TRACT | Status: DC | PRN
Start: 2021-05-30 — End: 2021-06-04

## 2021-05-30 MED ORDER — VIAL2BAG ADAPTOR (20 MM)
1 g | INTRAVENOUS | Status: DC
Start: 2021-05-30 — End: 2021-05-31
  Administered 2021-05-31: 02:00:00 1000 mg via INTRAVENOUS

## 2021-05-30 MED ORDER — CHLORHEXIDINE GLUCONATE 0.12 % MT SOLN
0.12 % | Freq: Four times a day (QID) | OROMUCOSAL | Status: DC
Start: 2021-05-30 — End: 2021-06-04
  Administered 2021-05-30 – 2021-06-04 (×20): 15 mL via OROMUCOSAL

## 2021-05-30 MED ORDER — OXYCODONE HCL 5 MG PO TABS
5 | Freq: Four times a day (QID) | ORAL | Status: DC | PRN
Start: 2021-05-30 — End: 2021-06-04
  Administered 2021-06-01 – 2021-06-04 (×7): 10 mg via ORAL

## 2021-05-30 MED ORDER — PIPERACILLIN SOD-TAZOBACTAM SO 3.375 (3-0.375) G IV SOLR
3.3753-0.375 (3-0.375) g | Freq: Three times a day (TID) | INTRAVENOUS | Status: DC
Start: 2021-05-30 — End: 2021-06-01
  Administered 2021-05-30 – 2021-06-01 (×8): 3375 mg via INTRAVENOUS

## 2021-05-30 MED ORDER — IPRATROPIUM-ALBUTEROL 0.5-2.5 (3) MG/3ML IN SOLN
0.5-2.533 (3) MG/3ML | RESPIRATORY_TRACT | Status: DC
Start: 2021-05-30 — End: 2021-06-02
  Administered 2021-05-30 – 2021-06-02 (×17): 3 mL via RESPIRATORY_TRACT

## 2021-05-30 MED ORDER — SODIUM CHLORIDE 0.9 % IV BOLUS
0.9 % | INTRAVENOUS | Status: AC
Start: 2021-05-30 — End: 2021-05-30
  Administered 2021-05-30: 02:00:00 1000 mL via INTRAVENOUS

## 2021-05-30 MED ORDER — TUBERCULIN PPD 5 UNIT/0.1ML ID SOLN
5 UNIT/0.1ML | Freq: Once | INTRADERMAL | Status: AC
Start: 2021-05-30 — End: 2021-05-31
  Administered 2021-05-30: 14:00:00 5 [IU] via INTRADERMAL

## 2021-05-30 MED ORDER — NORMAL SALINE FLUSH 0.9 % IV SOLN
0.9 % | Freq: Two times a day (BID) | INTRAVENOUS | Status: DC
Start: 2021-05-30 — End: 2021-06-04
  Administered 2021-05-30 – 2021-06-04 (×11): 10 mL via INTRAVENOUS

## 2021-05-30 MED ORDER — NALOXONE HCL 0.4 MG/ML IJ SOLN
0.4 MG/ML | INTRAMUSCULAR | Status: DC | PRN
Start: 2021-05-30 — End: 2021-05-30

## 2021-05-30 MED ORDER — LIDOCAINE HCL URETHRAL/MUCOSAL 2 % EX GEL
2 % | CUTANEOUS | Status: DC | PRN
Start: 2021-05-30 — End: 2021-05-30
  Administered 2021-05-30: 18:00:00 via TOPICAL

## 2021-05-30 MED FILL — PIPERACILLIN SOD-TAZOBACTAM SO 3.375 (3-0.375) G IV SOLR: 3.375 (3-0.375) g | INTRAVENOUS | Qty: 3375

## 2021-05-30 MED FILL — CHLORHEXIDINE GLUCONATE 0.12 % MT SOLN: 0.12 % | OROMUCOSAL | Qty: 15

## 2021-05-30 MED FILL — IPRATROPIUM-ALBUTEROL 0.5-2.5 (3) MG/3ML IN SOLN: 0.5-2.5 (3) MG/3ML | RESPIRATORY_TRACT | Qty: 3

## 2021-05-30 MED FILL — VANCOMYCIN HCL 1 G IV SOLR: 1 g | INTRAVENOUS | Qty: 1000

## 2021-05-30 MED FILL — FLUTICASONE PROPIONATE 50 MCG/ACT NA SUSP: 50 MCG/ACT | NASAL | Qty: 16

## 2021-05-30 MED FILL — BUDESONIDE 0.5 MG/2ML IN SUSP: 0.5 MG/2ML | RESPIRATORY_TRACT | Qty: 2

## 2021-05-30 MED FILL — AZITHROMYCIN 500 MG IV SOLR: 500 mg | INTRAVENOUS | Qty: 500

## 2021-05-30 MED FILL — ENOXAPARIN SODIUM 30 MG/0.3ML IJ SOSY: 30 MG/0.3ML | INTRAMUSCULAR | Qty: 0.3

## 2021-05-30 MED FILL — NICOTINE STEP 1 21 MG/24HR TD PT24: 21 mg/(24.h) | TRANSDERMAL | Qty: 1

## 2021-05-30 NOTE — Pre Sedation (Signed)
Sedation Pre-Procedure Note    Patient Name: Caleb Robinson   Date of Birth:06-26-1951  Room/Bed: ENDO/PL  Medical Record Number: [id number]  Date: 05/30/2021   Time: 1:41 PM       Indication:  cough    Consent: I have discussed with the patient and/or the patient representative the indication, alternatives, and the possible risks and/or complications of the planned procedure and the anesthesia methods. The patient and/or patient representative appear to understand and agree to proceed.    Vital Signs:   Vitals:    05/30/21 1302   BP: (!) 104/52   Pulse: 50   Resp: 16   Temp: 98.5 ??F (36.9 ??C)   SpO2: 97%       Past Medical History:   has a past medical history of Chronic obstructive pulmonary disease ([place]), Emphysema lung ([place]), HTN (hypertension), and Tongue cancer ([place]).    Past Surgical History:   has a past surgical history that includes hernia repair (05/2019).    Medications:   Scheduled Meds:   ??? chlorhexidine  15 mL Mouth/Throat 4x Daily   ??? fluticasone  2 spray Nasal Daily   ??? fluticasone-umeclidin-vilant  1 puff Inhalation Daily   ??? gabapentin  200 mg Oral TID   ??? ipratropium-albuterol  3 mL Inhalation 6 times per day   ??? LORazepam  0.5 mg Oral Daily   ??? sertraline  50 mg Oral Daily   ??? Vitamin D  1,000 Int'l Units Oral Daily   ??? sodium chloride flush  5-40 mL IntraVENous 2 times per day   ??? enoxaparin  30 mg SubCUTAneous Q24H   ??? piperacillin-tazobactam  3,375 mg IntraVENous q8h   ??? nicotine  1 patch TransDERmal Daily   ??? vancomycin  1,000 mg IntraVENous Q18H   ??? tuberculin  5 Units IntraDERmal Once     Continuous Infusions:   ??? sodium chloride       PRN Meds: albuterol sulfate HFA, diphenoxylate-atropine, oxyCODONE HCl, traZODone, sodium chloride flush, sodium chloride, ondansetron **OR** ondansetron, polyethylene glycol, acetaminophen **OR** acetaminophen, fentaNYL, midazolam PF, naloxone, flumazenil, lidocaine  Home Meds:   Prior to Admission medications    Medication Sig Start Date End Date  Taking? Authorizing Provider   sertraline (ZOLOFT) 20 MG/ML concentrated solution Take 2.5 mLs by mouth daily 2.5 mL DAILY (route: oral) 05/28/21   [name], MD   ibuprofen (ADVIL;MOTRIN) 800 MG tablet Take 1 tablet by mouth twice daily as needed for pain 05/25/21   Thereasa Distance, MD   oxyCODONE HCl (OXY-IR) 10 MG immediate release tablet Take 1 tablet by mouth every 6 hours as needed for Pain for up to 30 days. 05/17/21 06/16/21  [name] Crigler, APRN - CNP   albuterol sulfate HFA 108 (90 Base) MCG/ACT inhaler Inhale 2 puffs into the lungs every 6 hours as needed 02/14/21   Ar Automatic Reconciliation   budesonide (PULMICORT) 0.5 MG/2ML nebulizer suspension Inhale 500 mcg into the lungs 2 times daily 02/14/21   Ar Automatic Reconciliation   chlorhexidine (PERIDEX) 0.12 % solution 10 mL 4 TIMES DAILY (route: mucous membrane) 10/14/20   Ar Automatic Reconciliation   vitamin D 25 MCG (1000 UT) CAPS Take by mouth daily    Ar Automatic Reconciliation   diphenoxylate-atropine (LOMOTIL) 2.5-0.025 MG per tablet Take 1 tablet by mouth 4 times daily as needed. 01/23/21   Ar Automatic Reconciliation   fluticasone (FLONASE) 50 MCG/ACT nasal spray 2 sprays by Nasal route daily 03/26/21   Ar  Automatic Reconciliation   fluticasone-umeclidin-vilant (TRELEGY ELLIPTA) 100-62.5-25 MCG/INH AEPB 2 inhalation DAILY (route: inhalation) 08/21/20   Ar Automatic Reconciliation   gabapentin (NEURONTIN) 100 MG capsule Take 200 mg by mouth 3 times daily as needed. 02/06/21   Ar Automatic Reconciliation   ipratropium-albuterol (DUONEB) 0.5-2.5 (3) MG/3ML SOLN nebulizer solution Inhale 3 mLs into the lungs every 6 hours as needed 02/14/21   Ar Automatic Reconciliation   LORazepam (ATIVAN) 0.5 MG tablet Take 0.5 mg by mouth daily. 01/30/21   Ar Automatic Reconciliation   traZODone (DESYREL) 50 MG tablet Take 50 mg by mouth  Patient not taking: Reported on 05/30/2021 02/06/21   Ar Automatic Reconciliation     Coumadin Use Last 7 Days:  yes  Antiplatelet drug  therapy use last 7 days: no  Other anticoagulant use last 7 days: yes - iBUPROPHEN  Additional Medication Information:  none      Pre-Sedation Documentation and Exam:   I have personally completed a history, physical exam & review of systems for this patient (see notes).    Mallampati Airway Assessment:  Mallampati Class III - (soft palate & base of uvula are visible)    Prior History of Anesthesia Complications:   difficult intubation    ASA Classification:  Class 2 - A normal healthy patient with mild systemic disease    Sedation/ Anesthesia Plan:   intravenous sedation    Medications Planned:   midazolam (Versed) intravenously and fentanyl intravenously    Patient is an appropriate candidate for plan of sedation: yes    Electronically signed by Rogue Bussing, MD on 05/30/2021 at 1:41 PM

## 2021-05-30 NOTE — Progress Notes (Signed)
Hospitalist Progress Note   Admit Date:  05/29/2021  6:36 PM   Name:  Caleb Robinson   Age:  70 y.o.  Sex:  male  DOB:  1951/11/06   MRN:  [id number]   Room:  ENDO/PL    Presenting Complaint: Fatigue     Reason(s) for Admission: Hypoxia [R09.02]  Acute respiratory failure with hypoxemia ([place]) [J96.01]  Pneumonia of both lower lobes due to infectious organism [J18.9]     Hospital Course & Interval History:   Please refer to the admission H&P for details of presentation.      In summary, Caleb Robinson is a 70 y.o. 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  Meets criteria for sepsis with leukocytosis and tachypnea.  Noted to be hypoxic at 88% on room air requiring 2 L of O2 nasal cannula.  Chest x-ray shows increased bibasilar infiltrate concerning for aspiration pneumonia.    Subjective/24 hr Events (05/30/21) :  Patient is seen and examined at bedside.  No acute events reported overnight by nursing staff.  Currently on 2 L O2 nasal cannula.  Noted to have respiratory distress with movement.  Patient denies fever, chills, chest pains, shortness of breath, n/v, abdominal pain.    Review of Systems: 10 point review of systems is otherwise negative with the exception of the elements mentioned above.        Assessment & Plan:     Acute respiratory failure with hypoxemia concerning for Aspiration pneumonia  Emphysema  Patient was noted to be hypoxic with saturation of 88% on room air in the emergency room.  05/30/21: Patient is on 2 L nasal cannula during my encounter and visibly short of breath with increased work of breathing with minimal movement.  - pulmonary consulted. Bronch later today  - zosyn for aspiration pneumonia  -Continue with  vancomycin for now.  Will de-escalate once blood cultures negative x 2days  - O2 supplement and wean as tolerated  -Speech consulted.  Plan for modified barium swallow tomorrow      Anemia  Likely of chronic disease given underlying malignancy and poor nutrition.  Hb of 9.8 on admission.  Dropped to 9.1 today.  05/30/21: Iron of 12 with ferritin of 101.  We will give 1 dose of IV iron.  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   I spent 31 minutes of time caring for this patient at bedside or nearby, more than 50 percent of which was spent on coordination of care and/or counseling regarding the disease process, treatment options, and treatment plan.        Hospital Problems:  Principal Problem:    Acute respiratory failure with hypoxemia ([place])  Active Problems:    Anemia    Emphysema lung (HCC)    Tongue cancer (HCC)    Hyperlipidemia    Anxiety and depression    Aspiration pneumonia ([facility])    Severe protein-calorie malnutrition ([place])  Resolved Problems:    * No resolved hospital problems. *      Objective:     Patient Vitals for the past 24 hrs:   Temp Pulse Resp BP SpO2   05/30/21 1119 -- 72 18 -- 98 %   05/30/21 1045 98.5 ??F (36.9 ??C) 63 16 (!) 99/42 98 %   05/30/21 0938 -- -- -- -- (!) 88 %   05/30/21 0831 -- 93 18 -- 94 %   05/30/21 0715 98.3 ??F (36.8 ??C) 96 18 (!) 92/53 95 %   05/30/21 0657 97.9 ??F (36.6 ??C) 59 18 (!) 101/55 97 %   05/30/21 0615 -- 57 -- (!) 98/50 --   05/30/21 0441 -- 58 -- (!) 94/42 99 %   05/30/21 0415 -- 59 -- (!) 102/50 --   05/30/21 0406 -- 62 --  (!) 103/50 --   05/30/21 0225 98.1 ??F (36.7 ??C) 58 20 (!) 101/42 92 %   05/29/21 2238 -- 67 18 102/62 97 %   05/29/21 1630 98.3 ??F (36.8 ??C) 80 18 101/67 98 %       Oxygen Therapy  SpO2: 98 %  Pulse Oximeter Device Mode: Intermittent  O2 Device: Nasal cannula  O2 Flow Rate (L/min): 2 L/min    Estimated body mass index is 15.38 kg/m?? as calculated from the following:    Height as of this encounter: _0  (1.626 m).    Weight as of this encounter: 89 lb 9.6 oz (40.6 kg).    Intake/Output Summary (Last 24 hours) at 05/30/2021 1300  Last data filed at 05/30/2021 0959  Gross per 24 hour   Intake 0 ml   Output --   Net 0 ml         Physical Exam:     Blood pressure (!) 99/42, pulse 72, temperature 98.5 ??F (36.9 ??C), temperature source Oral, resp. rate 18, height _1  (1.626 m), weight 89 lb 9.6 oz (40.6 kg), SpO2 98 %.     General:    Cachetic, chronically ill appearing     Head:  Normocephalic, atraumatic  Eyes:  Sclerae appear normal.  Pupils equally round.  ENT:  Nares appear normal, no drainage.  Moist oral mucosa  Neck:  No restricted ROM.  Trachea midline   CV:   RRR.  No m/r/g.  No jugular venous distension.  Lungs:   Coarse breath sounds, no wheezing  Abdomen:   Bowel sounds present.  Soft, nontender, nondistended. +PEG  Extremities: No cyanosis or clubbing.  No edema  Skin:     No rashes and normal coloration.   Warm and dry.    Neuro:  CN II-XII grossly intact.  A&Ox3  Psych:  Normal mood and affect.      I have reviewed ordered lab tests and independently visualized imaging below:    Recent Labs:  Recent Results (from the past 48 hour(s))   CBC with Auto Differential    Collection Time: 05/29/21  4:43 PM   Result Value Ref Range    WBC 16.7 (H) 4.3 - 11.1 K/uL    RBC 3.16 (L) 4.23 - 5.6 M/uL    Hemoglobin 9.8 (L) 13.6 - 17.2 g/dL    Hematocrit 31.1 (L) 41.1 -  50.3 %    MCV 98.4 (H) 79.6 - 97.8 FL    MCH 31.0 26.1 - 32.9 PG    MCHC 31.5 31.4 - 35.0 g/dL    RDW 16.5 (H) 11.9 - 14.6 %    Platelets 231 150 - 450  K/uL    MPV 10.8 9.4 - 12.3 FL    nRBC 0.00 0.0 - 0.2 K/uL    Seg Neutrophils 90 (H) 43 - 78 %    Lymphocytes 3 (L) 13 - 44 %    Monocytes 5 4.0 - 12.0 %    Eosinophils % 0 (L) 0.5 - 7.8 %    Basophils 1 0.0 - 2.0 %    Immature Granulocytes 1 0.0 - 5.0 %    Segs Absolute 15.0 (H) 1.7 - 8.2 K/UL    Absolute Lymph # 0.5 0.5 - 4.6 K/UL    Absolute Mono # 0.8 0.1 - 1.3 K/UL    Absolute Eos # 0.0 0.0 - 0.8 K/UL    Basophils Absolute 0.2 0.0 - 0.2 K/UL    Absolute Immature Granulocyte 0.2 0.0 - 0.5 K/UL    RBC Comment SLIGHT  ANISOCYTOSIS + POIKILOCYTOSIS        WBC Comment Result Confirmed By Smear      Platelet Comment ADEQUATE      Differential Type AUTOMATED     Lactic Acid    Collection Time: 05/29/21  4:43 PM   Result Value Ref Range    Lactic Acid, Plasma 2.1 (H) 0.4 - 2.0 MMOL/L   COVID-19, Rapid    Collection Time: 05/29/21  4:43 PM    Specimen: Nasopharyngeal   Result Value Ref Range    Source NASAL      SARS-CoV-2, Rapid Not detected NOTD     Comprehensive Metabolic Panel    Collection Time: 05/29/21  7:45 PM   Result Value Ref Range    Sodium 141 138 - 145 mmol/L    Potassium 4.4 3.5 - 5.1 mmol/L    Chloride 104 98 - 107 mmol/L    CO2 31 21 - 32 mmol/L    Anion Gap 6 (L) 7 - 16 mmol/L    Glucose 89 65 - 100 mg/dL    BUN 33 (H) 8 - 23 MG/DL    CREATININE 0.80 0.8 - 1.5 MG/DL    GFR African American >60 >60 ml/min/1.74m    GFR Non-African American >60 >60 ml/min/1.782m   Calcium 9.2 8.3 - 10.4 MG/DL    Total Bilirubin 0.4 0.2 - 1.1 MG/DL    ALT 25 12 - 65 U/L    AST 41 (H) 15 - 37 U/L    Alk Phosphatase 115 50 - 136 U/L    Total Protein 6.4 6.3 - 8.2 g/dL    Albumin 2.6 (L) 3.2 - 4.6 g/dL    Globulin 3.8 (H) 2.3 - 3.5 g/dL    Albumin/Globulin Ratio 0.7 (L) 1.2 - 3.5     Culture, Blood 1    Collection Time: 05/29/21  7:45 PM    Specimen: Blood   Result Value Ref Range    Special Requests RIGHT  BLUE PICC        Culture NO GROWTH AFTER 11 HOURS     Culture, Blood 1    Collection Time: 05/29/21  7:45 PM     Specimen: Blood   Result Value Ref Range    Special Requests LEFT  FOREARM        Culture  NO GROWTH AFTER 11 HOURS     Procalcitonin    Collection Time: 05/29/21  7:45 PM   Result Value Ref Range    Procalcitonin 31.02 (H) 0.00 - 0.49 ng/mL   Respiratory Panel, Molecular, with COVID-19 (Restricted: peds pts or suitable admitted adults)    Collection Time: 05/29/21 10:47 PM    Specimen: Nasopharyngeal   Result Value Ref Range    Adenovirus by PCR NOT DETECTED NOTDET      Coronavirus 229E by PCR NOT DETECTED NOTDET      Coronavirus HKU1 by PCR NOT DETECTED NOTDET      Coronavirus NL63 by PCR NOT DETECTED NOTDET      Coronavirus OC43 by PCR NOT DETECTED NOTDET      SARS-CoV-2, PCR NOT DETECTED NOTDET      Human Metapneumovirus by PCR NOT DETECTED NOTDET      Rhinovirus Enterovirus PCR NOT DETECTED NOTDET      Influenza A by PCR NOT DETECTED NOTDET      INFLUENZA B PCR NOT DETECTED NOTDET      Parainfluenza 1 PCR NOT DETECTED NOTDET      Parainfluenza 2 PCR NOT DETECTED NOTDET      Parainfluenza 3 PCR NOT DETECTED NOTDET      Parainfluenza 4 PCR NOT DETECTED NOTDET      Respiratory Syncytial Virus by PCR NOT DETECTED NOTDET      Bordetella parapertussis by PCR NOT DETECTED NOTDET      Bordetella pertussis by PCR NOT DETECTED NOTDET      Chlamydophila Pneumonia PCR NOT DETECTED NOTDET      Mycoplasma pneumo by PCR NOT DETECTED NOTDET     Lactic Acid    Collection Time: 05/30/21  1:57 AM   Result Value Ref Range    Lactic Acid, Plasma 0.6 0.4 - 2.0 MMOL/L   POCT Urinalysis no Micro    Collection Time: 05/30/21  2:01 AM   Result Value Ref Range    Specific Gravity, Urine, POC 1.020 1.001 - 1.023      pH, Urine, POC 6.5 5.0 - 9.0      Protein, Urine, POC Negative NEG mg/dL    Glucose, UA POC Negative NEG mg/dL    KETONES, Urine, POC 15 (A) NEG mg/dL    Bilirubin, Urine, POC Negative NEG      Blood, UA POC Negative NEG      URINE UROBILINOGEN POC 0.2 0.2 - 1.0 EU/dL    Nitrate, Urine, POC Negative NEG      Leukocyte Est, UA  POC Negative NEG      Performed by: Marc Morgans    EKG 12 lead    Collection Time: 05/30/21  3:12 AM   Result Value Ref Range    Ventricular Rate 56 BPM    Atrial Rate 56 BPM    P-R Interval 128 ms    QRS Duration 92 ms    Q-T Interval 464 ms    QTc Calculation (Bazett) 447 ms    P Axis 71 degrees    R Axis 81 degrees    T Axis 63 degrees    Diagnosis Sinus bradycardia    CBC with Auto Differential    Collection Time: 05/30/21  3:33 AM   Result Value Ref Range    WBC 15.2 (H) 4.3 - 11.1 K/uL    RBC 2.97 (L) 4.23 - 5.6 M/uL    Hemoglobin 9.1 (L) 13.6 - 17.2 g/dL    Hematocrit 28.8 (L)  41.1 - 50.3 %    MCV 97.0 79.6 - 97.8 FL    MCH 30.6 26.1 - 32.9 PG    MCHC 31.6 31.4 - 35.0 g/dL    RDW 16.7 (H) 11.9 - 14.6 %    Platelets 249 150 - 450 K/uL    MPV 11.0 9.4 - 12.3 FL    nRBC 0.00 0.0 - 0.2 K/uL    Differential Type AUTOMATED      Seg Neutrophils 88 (H) 43 - 78 %    Lymphocytes 5 (L) 13 - 44 %    Monocytes 6 4.0 - 12.0 %    Eosinophils % 0 (L) 0.5 - 7.8 %    Basophils 0 0.0 - 2.0 %    Immature Granulocytes 1 0.0 - 5.0 %    Segs Absolute 13.3 (H) 1.7 - 8.2 K/UL    Absolute Lymph # 0.7 0.5 - 4.6 K/UL    Absolute Mono # 0.9 0.1 - 1.3 K/UL    Absolute Eos # 0.0 0.0 - 0.8 K/UL    Basophils Absolute 0.1 0.0 - 0.2 K/UL    Absolute Immature Granulocyte 0.2 0.0 - 0.5 K/UL   Hepatic function panel    Collection Time: 05/30/21  3:35 AM   Result Value Ref Range    Total Protein 5.4 (L) 6.3 - 8.2 g/dL    Albumin 2.2 (L) 3.2 - 4.6 g/dL    Globulin 3.2 2.3 - 3.5 g/dL    Albumin/Globulin Ratio 0.7 (L) 1.2 - 3.5      Total Bilirubin 0.3 0.2 - 1.1 MG/DL    Bilirubin, Direct 0.1 <0.4 MG/DL    Alk Phosphatase 83 50 - 136 U/L    AST 34 15 - 37 U/L    ALT 22 12 - 65 U/L   Lactic Acid    Collection Time: 05/30/21  3:35 AM   Result Value Ref Range    Lactic Acid, Plasma 0.7 0.4 - 2.0 MMOL/L   Troponin    Collection Time: 05/30/21  3:35 AM   Result Value Ref Range    Troponin, High Sensitivity 14.2 (H) 0 - 14 pg/mL   Comprehensive  Metabolic Panel w/ Reflex to MG    Collection Time: 05/30/21  3:35 AM   Result Value Ref Range    Sodium 144 138 - 145 mmol/L    Potassium 4.1 3.5 - 5.1 mmol/L    Chloride 108 (H) 98 - 107 mmol/L    CO2 31 21 - 32 mmol/L    Anion Gap 5 (L) 7 - 16 mmol/L    Glucose 73 65 - 100 mg/dL    BUN 31 (H) 8 - 23 MG/DL    CREATININE 0.70 (L) 0.8 - 1.5 MG/DL    GFR African American >60 >60 ml/min/1.5m    GFR Non-African American >60 >60 ml/min/1.730m   Calcium 8.5 8.3 - 10.4 MG/DL    Total Bilirubin 0.3 0.2 - 1.1 MG/DL    ALT 22 12 - 65 U/L    AST 34 15 - 37 U/L    Alk Phosphatase 83 50 - 136 U/L    Total Protein 5.5 (L) 6.3 - 8.2 g/dL    Albumin 2.2 (L) 3.2 - 4.6 g/dL    Globulin 3.3 2.3 - 3.5 g/dL    Albumin/Globulin Ratio 0.7 (L) 1.2 - 3.5     Transferrin Saturation    Collection Time: 05/30/21  3:36 AM   Result Value Ref Range  Iron 12 (L) 35 - 150 ug/dL    TIBC 227 (L) 250 - 450 ug/dL    TRANSFERRIN SATURATION 5 (L) >20 %   Vitamin B12    Collection Time: 05/30/21  3:36 AM   Result Value Ref Range    Vitamin B-12 1381 (H) 193 - 986 pg/mL   Ferritin    Collection Time: 05/30/21  3:36 AM   Result Value Ref Range    Ferritin 101 8 - 388 NG/ML   Folate    Collection Time: 05/30/21  3:36 AM   Result Value Ref Range    Folate 32.0 (H) 3.1 - 17.5 ng/mL   Troponin    Collection Time: 05/30/21  8:21 AM   Result Value Ref Range    Troponin, High Sensitivity 11.0 0 - 14 pg/mL   Urinalysis w rflx microscopic    Collection Time: 05/30/21  8:28 AM   Result Value Ref Range    Color, UA YELLOW/STRAW      Appearance CLEAR      Specific Gravity, UA 1.028 (H) 1.001 - 1.023      pH, Urine 6.5 5.0 - 9.0      Protein, UA TRACE (A) NEG mg/dL    Glucose, UA Negative mg/dL    Ketones, Urine 15 (A) NEG mg/dL    Bilirubin Urine Negative NEG      Blood, Urine Negative NEG      Urobilinogen, Urine 1.0 0.2 - 1.0 EU/dL    Nitrite, Urine Negative NEG      Leukocyte Esterase, Urine Negative NEG      WBC, UA 0-4 (A) NORM /hpf    RBC, UA 0-5 (A) NORM  /hpf    Epithelial Cells UA 0-5 (A) NORM /hpf    BACTERIA, URINE Negative (A) NORM /hpf    Casts 0-2 (A) NORM /lpf       Other Studies:  XR CHEST PORTABLE   Final Result      1. Findings as described above.       621      XR CHEST (2 VW)   Final Result   Increased bibasilar infiltrate         FL MODIFIED BARIUM SWALLOW W VIDEO    (Results Pending)       Current Meds:  Current Facility-Administered Medications   Medication Dose Route Frequency   ??? albuterol sulfate HFA (PROVENTIL;VENTOLIN;PROAIR) 108 (90 Base) MCG/ACT inhaler 2 puff  2 puff Inhalation Q6H PRN   ??? chlorhexidine (PERIDEX) 0.12 % solution 15 mL  15 mL Mouth/Throat 4x Daily   ??? diphenoxylate-atropine (LOMOTIL) 2.5-0.025 MG per tablet 1 tablet  1 tablet Oral 4x Daily PRN   ??? fluticasone (FLONASE) 50 MCG/ACT nasal spray 2 spray  2 spray Nasal Daily   ??? fluticasone-umeclidin-vilant (TRELEGY ELLIPTA) 100-62.5-25 MCG/INH inhaler 1 puff (Patient Supplied)  1 puff Inhalation Daily   ??? gabapentin (NEURONTIN) capsule 200 mg  200 mg Oral TID   ??? ipratropium-albuterol (DUONEB) nebulizer solution 3 mL  3 mL Inhalation 6 times per day   ??? LORazepam (ATIVAN) tablet 0.5 mg  0.5 mg Oral Daily   ??? oxyCODONE (ROXICODONE) immediate release tablet 10 mg  10 mg Oral Q6H PRN   ??? sertraline (ZOLOFT) 20 MG/ML concentrated solution 50 mg (Patient Supplied)  50 mg Oral Daily   ??? traZODone (DESYREL) tablet 50 mg  50 mg Oral Nightly PRN   ??? Vitamin D (CHOLECALCIFEROL) tablet 1,000 Units  1,000 Int'l Units Oral Daily   ???  sodium chloride flush 0.9 % injection 5-40 mL  5-40 mL IntraVENous 2 times per day   ??? sodium chloride flush 0.9 % injection 5-40 mL  5-40 mL IntraVENous PRN   ??? 0.9 % sodium chloride infusion   IntraVENous PRN   ??? enoxaparin Sodium (LOVENOX) injection 30 mg  30 mg SubCUTAneous Q24H   ??? ondansetron (ZOFRAN-ODT) disintegrating tablet 4 mg  4 mg Oral Q8H PRN    Or   ??? ondansetron (ZOFRAN) injection 4 mg  4 mg IntraVENous Q6H PRN   ??? polyethylene glycol (GLYCOLAX)  packet 17 g  17 g Oral Daily PRN   ??? acetaminophen (TYLENOL) tablet 650 mg  650 mg Oral Q6H PRN    Or   ??? acetaminophen (TYLENOL) suppository 650 mg  650 mg Rectal Q6H PRN   ??? piperacillin-tazobactam (ZOSYN) 3,375 mg in sodium chloride 0.9 % 50 mL IVPB (mini-bag)  3,375 mg IntraVENous q8h   ??? nicotine (NICODERM CQ) 21 MG/24HR 1 patch  1 patch TransDERmal Daily   ??? vancomycin (VANCOCIN) 1,000 mg in sodium chloride 0.9 % 250 mL IVPB (Vial2Bag)  1,000 mg IntraVENous Q18H   ??? tuberculin injection 5 Units  5 Units IntraDERmal Once       Signed:  Helmut Muster, MD    Part of this note may have been written by using a voice dictation software.  The note has been proof read but may still contain some grammatical/other typographical errors.

## 2021-05-30 NOTE — Progress Notes (Signed)
Do not carry the syringe that connect to feeding tube system. According to pt, feeding tube was to be replaced prior to him being admitted here per his primary care physician. Will make MD aware.

## 2021-05-30 NOTE — Progress Notes (Addendum)
During admission assessment Pt AO x4 with flat affect. Pt wife at bedside to assist in admission database. Pt speech garbled. PEG tube dressing was saturated in brown drainage. Dressing removed and site cleaned with CHG wipe. Site around tube is red and inflamed. New dressing placed. 0300 provider Dr. Mariane Duval notified via perfect serve about pt having poor oral intake and unable to do tube feedings for the last two day. Pt states "I was suppose to have my tube changed yesterday." Pt tube feeding formula not supplied on floor. Provider instructed to hold tube feedings. No IV fluids ordered.     0420 - Provider notified about pt vitals. No new orders  0445 - Provider notified due to writer concern about B/P and heart rate. ICU Rover called to assess pt. New orders placed.

## 2021-05-30 NOTE — Consults (Signed)
History and Physical Initial Visit NOTE           05/30/2021    Caleb Robinson                        Date of Admission:  05/29/2021    The patient's chart is reviewed and the patient is discussed with the staff.    Subjective:     Patient is a 70 y.o. Caucasian male seen and evaluated at the request of Dr. Doren Custard. 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 Pt was recently hospitalized 5/6-04/16/21 for acute on chronic respiratory failure and aspiration pneumonia. He was seen by his PCP on 6/21 and found to have a fever and diarrhea. Pt was admitted by the hospitalist service and we were consulted for therapeutic bronch.   Pt reports that he continues to smoke 2ppd. He has been coughing but is unable to cough anything up secondary to tongue resection. He reports that he is not on O2 at home. He does use Trelegy and albuterol at home. He has been using his PEG without issue although H&P states that his PEG hasn't been working well for 2 days. We discussed need for Bronch and pt is agreeable. He is NPO currently.     Review of Systems  Constitutional: positive for fatigue  Eyes: positive for contacts/glasses  Ears, nose, mouth, throat, and face: positive for sore mouth, sore throat, voice change and s/p tongue resection  Respiratory: positive for cough and dyspnea on exertion  Cardiovascular: negative  Gastrointestinal: positive for PEG in place  Genitourinary:positive for frequency  Hematologic/lymphatic: negative  Musculoskeletal:positive for back pain, bone pain and stiff joints  Neurological: negative  Behavioral/Psych: positive for tobacco use  Endocrine: negative  Allergic/Immunologic: negative    Current Outpatient Medications   Medication Instructions   ??? albuterol sulfate HFA 108 (90 Base) MCG/ACT inhaler 2 puffs, Inhalation, EVERY 6 HOURS PRN   ???  budesonide (PULMICORT) 500 mcg, Inhalation, 2 TIMES DAILY   ??? chlorhexidine (PERIDEX) 0.12 % solution 10 mL 4 TIMES DAILY (route: mucous membrane)   ??? diphenoxylate-atropine (LOMOTIL) 2.5-0.025 MG per tablet 1 tablet, Oral, 4 TIMES DAILY PRN   ??? fluticasone (FLONASE) 50 MCG/ACT nasal spray 2 sprays, Nasal, DAILY   ??? fluticasone-umeclidin-vilant (TRELEGY ELLIPTA) 100-62.5-25 MCG/INH AEPB 2 inhalation DAILY (route: inhalation)   ??? gabapentin (NEURONTIN) 200 mg, Oral, 3 TIMES DAILY PRN   ??? ibuprofen (ADVIL;MOTRIN) 800 MG tablet Take 1 tablet by mouth twice daily as needed for pain   ??? ipratropium-albuterol (DUONEB) 0.5-2.5 (3) MG/3ML SOLN nebulizer solution 3 mLs, Inhalation, EVERY 6 HOURS PRN   ??? LORazepam (ATIVAN) 0.5 mg, Oral, DAILY   ??? oxyCODONE HCl (OXY-IR) 10 mg, Oral, EVERY 6 HOURS PRN   ??? sertraline (ZOLOFT) 50 mg, Oral, DAILY, 2.5 mL DAILY (route: oral)   ??? traZODone (DESYREL) 50 mg   ??? vitamin D 25 MCG (1000 UT) CAPS Oral, DAILY      Past Medical History:   Diagnosis Date   ??? Chronic obstructive pulmonary disease ([place])     follows with Pulmonology.  daily and rescure inhaler. no home O2.   ??? Emphysema lung ([place])    ??? HTN (hypertension)    ??? [facility]ue cancer [facility])      Past Surgical History:   Procedure Laterality Date   ???  HERNIA REPAIR  05/2019     Social History     Socioeconomic History   ??? Marital status: Single     Spouse name: Not on file   ??? Number of children: Not on file   ??? Years of education: Not on file   ??? Highest education level: Not on file   Occupational History   ??? Not on file   Tobacco Use   ??? Smoking status: Current Every Day Smoker     Packs/day: 0.75   ??? Smokeless tobacco: Never Used   ??? Tobacco comment: Quit smoking: stopped but restarted Dec 2019   Substance and Sexual Activity   ??? Alcohol use: Yes     Alcohol/week: 1.0 standard drink   ??? Drug use: Never   ??? Sexual activity: Not on file     Comment: widow   Other Topics Concern   ??? Not on file   Social History Narrative   ??? Not on file      Social Determinants of Health     Financial Resource Strain:    ??? Difficulty of Paying Living Expenses: Not on file   Food Insecurity:    ??? Worried About Running Out of Food in the Last Year: Not on file   ??? Ran Out of Food in the Last Year: Not on file   Transportation Needs:    ??? Lack of Transportation (Medical): Not on file   ??? Lack of Transportation (Non-Medical): Not on file   Physical Activity:    ??? Days of Exercise per Week: Not on file   ??? Minutes of Exercise per Session: Not on file   Stress:    ??? Feeling of Stress : Not on file   Social Connections:    ??? Frequency of Communication with Friends and Family: Not on file   ??? Frequency of Social Gatherings with Friends and Family: Not on file   ??? Attends Religious Services: Not on file   ??? Active Member of Clubs or Organizations: Not on file   ??? Attends Archivist Meetings: Not on file   ??? Marital Status: Not on file   Intimate Partner Violence:    ??? Fear of Current or Ex-Partner: Not on file   ??? Emotionally Abused: Not on file   ??? Physically Abused: Not on file   ??? Sexually Abused: Not on file   Housing Stability:    ??? Unable to Pay for Housing in the Last Year: Not on file   ??? Number of Places Lived in the Last Year: Not on file   ??? Unstable Housing in the Last Year: Not on file     Family History   Problem Relation Age of Onset   ??? Dementia Mother      No Known Allergies  Objective:   Blood pressure (!) 92/53, pulse 93, temperature 98.3 ??F (36.8 ??C), temperature source Oral, resp. rate 18, height 5\' 4"  (1.626 m), weight 89 lb 9.6 oz (40.6 kg), SpO2 94 %.     Intake/Output Summary (Last 24 hours) at 05/30/2021 1025  Last data filed at 05/30/2021 0959  Gross per 24 hour   Intake 0 ml   Output --   Net 0 ml     PHYSICAL EXAM   Constitutional:  the patient is well developed and in no acute distress, on RA  EENMT:  Sclera clear, pupils equal, oral mucosa moist  Respiratory: coarse breath sounds at bases  Cardiovascular:  RRR without  M,G,R  Gastrointestinal: soft and non-tender; with positive bowel sounds.  Musculoskeletal: warm without cyanosis. There is no lower extremity edema.  Skin:  no jaundice or rashes   Neurologic: no gross neuro deficits     Psychiatric:  alert and oriented x 3    CXR:      CT Chest: 04/14/21:              Recent Labs     05/29/21  1643 05/29/21  1945 05/30/21  0333 05/30/21  0335   WBC 16.7*  --  15.2*  --    HGB 9.8*  --  9.1*  --    HCT 31.1*  --  28.8*  --    PLT 231  --  249  --    NA  --  141  --  144   K  --  4.4  --  4.1   CL  --  104  --  108*   CO2  --  31  --  31   BUN  --  33*  --  31*   BILITOT  --  0.4  --  0.3   0.3   AST  --  41*  --  34   34   ALT  --  25  --  22   22   ALKPHOS  --  115  --  83   83   TROPHS  --   --   --  14.2*     ECHO: No results found for this or any previous visit.    MICRO:   Recent Labs     05/29/21  1945   CULTURE NO GROWTH AFTER 11 HOURS   NO GROWTH AFTER 11 HOURS     Assessment and Plan:  (Medical Decision Making)   Principal Problem:    Acute respiratory failure with hypoxemia ([place])  Plan: wean O2 as tolerated, may need O2 at discharge   Active Problems:    Anemia  Plan: monitor     Emphysema lung ([place])  Plan: continue nebs/trelegy    Tongue cancer ([place])  Plan: s/p resection and treatment    Hyperlipidemia  Plan: chronic     Anxiety and depression  Plan: chronic     Aspiration pneumonia ([place])  Plan: plans for therapeutic bronch with BAL today, pt NPO.  Pt currently on Zosyn    Severe protein-calorie malnutrition ([place])  Plan: continue TFs once bronch completed       Full Code    Thank you very much for this referral.  We appreciate the opportunity to participate in this patient's care.  Will follow along with above stated plan.    In this split/shared evaluation I performed counseled and educated the patient and/or family member, ordered medications, tests or procedures, documented information in EMR and coordinated care. which accounted for 18 clinical time.     Charna Busman NP    The patient has been seen and examined by me personally, the chart,labs, and radiographic studies have been reviewed.    Chest: CTA poor air entry  Extremities: no edema    I agree with the above assessment and plan.  For therapeutic bronchoscopy at 1:30 PM today    SLP assessment today:  Patient with severe oral dysphagia and high risk for pharyngeal dysphagia due to history of oral cancer. He is s/p glossectomy and mandibular graft which significantly impacts oral stage of swallow. Primarily uses PEG for nutritional intake,  but will consume thin liquid for pleasure with use of syringe.   ??  He demonstrated independently use of syringe for thin liquid consumption. Posterior placement of syringe bypasses oral phase of swallow and directs liquids directly into pharynx. Slightly delayed swallow initiation upon palpation, but will require instrumental assessment to fully assess. No overt s/sx of airway compromise observed with trials.    ??  Patient is certainly at high risk for aspiration given his history of dysphagia and oral cancer. Recommend use of PEG for nutrition/hydration/medication. OK for WATER ONLY via syringe until instrumental assessment is completed on 05/31/21. He verbalizes understanding and agreement with plan. Of note, patient's current oral syringe is discolored. Hygiene concerns discussed with RN who is going to attempt to find appropriate replacement for his current equipment.      He will always be at risk of aspiration PNA    Rogue Bussing MD.

## 2021-05-30 NOTE — H&P (View-Only) (Signed)
History and Physical Initial Visit NOTE           05/30/2021    Caleb Robinson                        Date of Admission:  05/29/2021    The patient's chart is reviewed and the patient is discussed with the staff.    Subjective:     Patient is a 70 y.o. Caucasian male seen and evaluated at the request of Dr. Doren Custard. 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 Pt was recently hospitalized 5/6-04/16/21 for acute on chronic respiratory failure and aspiration pneumonia. He was seen by his PCP on 6/21 and found to have a fever and diarrhea. Pt was admitted by the hospitalist service and we were consulted for therapeutic bronch.   Pt reports that he continues to smoke 2ppd. He has been coughing but is unable to cough anything up secondary to tongue resection. He reports that he is not on O2 at home. He does use Trelegy and albuterol at home. He has been using his PEG without issue although H&P states that his PEG hasn't been working well for 2 days. We discussed need for Bronch and pt is agreeable. He is NPO currently.     Review of Systems  Constitutional: positive for fatigue  Eyes: positive for contacts/glasses  Ears, nose, mouth, throat, and face: positive for sore mouth, sore throat, voice change and s/p tongue resection  Respiratory: positive for cough and dyspnea on exertion  Cardiovascular: negative  Gastrointestinal: positive for PEG in place  Genitourinary:positive for frequency  Hematologic/lymphatic: negative  Musculoskeletal:positive for back pain, bone pain and stiff joints  Neurological: negative  Behavioral/Psych: positive for tobacco use  Endocrine: negative  Allergic/Immunologic: negative    Current Outpatient Medications   Medication Instructions   ??? albuterol sulfate HFA 108 (90 Base) MCG/ACT inhaler 2 puffs, Inhalation, EVERY 6 HOURS PRN   ???  budesonide (PULMICORT) 500 mcg, Inhalation, 2 TIMES DAILY   ??? chlorhexidine (PERIDEX) 0.12 % solution 10 mL 4 TIMES DAILY (route: mucous membrane)   ??? diphenoxylate-atropine (LOMOTIL) 2.5-0.025 MG per tablet 1 tablet, Oral, 4 TIMES DAILY PRN   ??? fluticasone (FLONASE) 50 MCG/ACT nasal spray 2 sprays, Nasal, DAILY   ??? fluticasone-umeclidin-vilant (TRELEGY ELLIPTA) 100-62.5-25 MCG/INH AEPB 2 inhalation DAILY (route: inhalation)   ??? gabapentin (NEURONTIN) 200 mg, Oral, 3 TIMES DAILY PRN   ??? ibuprofen (ADVIL;MOTRIN) 800 MG tablet Take 1 tablet by mouth twice daily as needed for pain   ??? ipratropium-albuterol (DUONEB) 0.5-2.5 (3) MG/3ML SOLN nebulizer solution 3 mLs, Inhalation, EVERY 6 HOURS PRN   ??? LORazepam (ATIVAN) 0.5 mg, Oral, DAILY   ??? oxyCODONE HCl (OXY-IR) 10 mg, Oral, EVERY 6 HOURS PRN   ??? sertraline (ZOLOFT) 50 mg, Oral, DAILY, 2.5 mL DAILY (route: oral)   ??? traZODone (DESYREL) 50 mg   ??? vitamin D 25 MCG (1000 UT) CAPS Oral, DAILY      Past Medical History:   Diagnosis Date   ??? Chronic obstructive pulmonary disease ([place])     follows with Pulmonology.  daily and rescure inhaler. no home O2.   ??? Emphysema lung ([place])    ??? HTN (hypertension)    ??? Tongue cancer [facility])      Past Surgical History:   Procedure Laterality Date   ???  HERNIA REPAIR  05/2019     Social History     Socioeconomic History   ??? Marital status: Single     Spouse name: Not on file   ??? Number of children: Not on file   ??? Years of education: Not on file   ??? Highest education level: Not on file   Occupational History   ??? Not on file   Tobacco Use   ??? Smoking status: Current Every Day Smoker     Packs/day: 0.75   ??? Smokeless tobacco: Never Used   ??? Tobacco comment: Quit smoking: stopped but restarted Dec 2019   Substance and Sexual Activity   ??? Alcohol use: Yes     Alcohol/week: 1.0 standard drink   ??? Drug use: Never   ??? Sexual activity: Not on file     Comment: widow   Other Topics Concern   ??? Not on file   Social History Narrative   ??? Not on file      Social Determinants of Health     Financial Resource Strain:    ??? Difficulty of Paying Living Expenses: Not on file   Food Insecurity:    ??? Worried About Running Out of Food in the Last Year: Not on file   ??? Ran Out of Food in the Last Year: Not on file   Transportation Needs:    ??? Lack of Transportation (Medical): Not on file   ??? Lack of Transportation (Non-Medical): Not on file   Physical Activity:    ??? Days of Exercise per Week: Not on file   ??? Minutes of Exercise per Session: Not on file   Stress:    ??? Feeling of Stress : Not on file   Social Connections:    ??? Frequency of Communication with Friends and Family: Not on file   ??? Frequency of Social Gatherings with Friends and Family: Not on file   ??? Attends Religious Services: Not on file   ??? Active Member of Clubs or Organizations: Not on file   ??? Attends Archivist Meetings: Not on file   ??? Marital Status: Not on file   Intimate Partner Violence:    ??? Fear of Current or Ex-Partner: Not on file   ??? Emotionally Abused: Not on file   ??? Physically Abused: Not on file   ??? Sexually Abused: Not on file   Housing Stability:    ??? Unable to Pay for Housing in the Last Year: Not on file   ??? Number of Places Lived in the Last Year: Not on file   ??? Unstable Housing in the Last Year: Not on file     Family History   Problem Relation Age of Onset   ??? Dementia Mother      No Known Allergies  Objective:   Blood pressure (!) 92/53, pulse 93, temperature 98.3 ??F (36.8 ??C), temperature source Oral, resp. rate 18, height 5\' 4"  (1.626 m), weight 89 lb 9.6 oz (40.6 kg), SpO2 94 %.     Intake/Output Summary (Last 24 hours) at 05/30/2021 1025  Last data filed at 05/30/2021 0959  Gross per 24 hour   Intake 0 ml   Output ???   Net 0 ml     PHYSICAL EXAM   Constitutional:  the patient is well developed and in no acute distress, on RA  EENMT:  Sclera clear, pupils equal, oral mucosa moist  Respiratory: coarse breath sounds at bases  Cardiovascular:  RRR without  M,G,R  Gastrointestinal: soft and non-tender; with positive bowel sounds.  Musculoskeletal: warm without cyanosis. There is no lower extremity edema.  Skin:  no jaundice or rashes   Neurologic: no gross neuro deficits     Psychiatric:  alert and oriented x 3    CXR:      CT Chest: 04/14/21:              Recent Labs     05/29/21  1643 05/29/21  1945 05/30/21  0333 05/30/21  0335   WBC 16.7*  --  15.2*  --    HGB 9.8*  --  9.1*  --    HCT 31.1*  --  28.8*  --    PLT 231  --  249  --    NA  --  141  --  144   K  --  4.4  --  4.1   CL  --  104  --  108*   CO2  --  31  --  31   BUN  --  33*  --  31*   BILITOT  --  0.4  --  0.3   0.3   AST  --  41*  --  34   34   ALT  --  25  --  22   22   ALKPHOS  --  115  --  83   83   TROPHS  --   --   --  14.2*     ECHO: No results found for this or any previous visit.    MICRO:   Recent Labs     05/29/21  1945   CULTURE NO GROWTH AFTER 11 HOURS   NO GROWTH AFTER 11 HOURS     Assessment and Plan:  (Medical Decision Making)   Principal Problem:    Acute respiratory failure with hypoxemia ([place])  Plan: wean O2 as tolerated, may need O2 at discharge   Active Problems:    Anemia  Plan: monitor     Emphysema lung ([place])  Plan: continue nebs/trelegy    Tongue cancer ([place])  Plan: s/p resection and treatment    Hyperlipidemia  Plan: chronic     Anxiety and depression  Plan: chronic     Aspiration pneumonia ([place])  Plan: plans for therapeutic bronch with BAL today, pt NPO.  Pt currently on Zosyn    Severe protein-calorie malnutrition ([place])  Plan: continue TFs once bronch completed       Full Code    Thank you very much for this referral.  We appreciate the opportunity to participate in this patient's care.  Will follow along with above stated plan.    In this split/shared evaluation I performed counseled and educated the patient and/or family member, ordered medications, tests or procedures, documented information in EMR and coordinated care. which accounted for 18 clinical time.     Charna Busman NP    The patient has been seen and examined by me personally, the chart,labs, and radiographic studies have been reviewed.    Chest: CTA poor air entry  Extremities: no edema    I agree with the above assessment and plan.  For therapeutic bronchoscopy at 1:30 PM today    SLP assessment today:  Patient with severe oral dysphagia and high risk for pharyngeal dysphagia due to history of oral cancer. He is s/p glossectomy and mandibular graft which significantly impacts oral stage of swallow. Primarily uses PEG for nutritional intake,  but will consume thin liquid for pleasure with use of syringe.   ??  He demonstrated independently use of syringe for thin liquid consumption. Posterior placement of syringe bypasses oral phase of swallow and directs liquids directly into pharynx. Slightly delayed swallow initiation upon palpation, but will require instrumental assessment to fully assess. No overt s/sx of airway compromise observed with trials.    ??  Patient is certainly at high risk for aspiration given his history of dysphagia and oral cancer. Recommend use of PEG for nutrition/hydration/medication. OK for WATER ONLY via syringe until instrumental assessment is completed on 05/31/21. He verbalizes understanding and agreement with plan. Of note, patient's current oral syringe is discolored. Hygiene concerns discussed with RN who is going to attempt to find appropriate replacement for his current equipment.      He will always be at risk of aspiration PNA    Rogue Bussing MD.

## 2021-05-30 NOTE — Progress Notes (Signed)
LTG: Patient will tolerate least restrictive oral diet without overt s/sx of airway compromise.   STG: Patient will consume thin liquids for pleasure without overt s/sx of airway compromise.   STG: Patient will participate in modified barium swallow study to objectively assess swallow function as medically indicated.       SPEECH LANGUAGE PATHOLOGY: DYSPHAGIA  Initial Assessment    NAME: Caleb Robinson  DOB: [birth year]  MRN: [id number]    ADMISSION DATE: 05/29/2021  ADMITTING DIAGNOSIS: has Emphysema lung ([place]); Dehydration; Vision loss; Tongue cancer ([place]); History of tobacco abuse; Centrilobular emphysema ([place]); Unstable angina ([place]); Myxomatous mitral valve; Acute respiratory failure with hypoxia ([place]); Hyperlipidemia; Anxiety and depression; COPD with acute lower respiratory infection ([place]); Aspiration pneumonia ([place]); Severe protein-calorie malnutrition ([place]); Acute respiratory failure with hypoxemia ([facility]); and Anemia on their problem list.    ICD-10: Treatment Diagnosis: R13.12 Dysphagia, Oropharyngeal Phase    RECOMMENDATIONS   Diet:  Diet Solids Recommendation: NPO  Liquid Consistency Recommendation: NPO (Sips of thin liquids for pleasure with use of syringe)    Medications: Via alternative means of nutrition     Recommendations: Modified barium swallow study;Consider alternative nutrition     Compensatory Swallowing Strategies: (Syringe for liquid intake)     Therapeutic Intervention:Patient/Family education;Oral care (Modified barium swallow study)     Patient continues to require skilled intervention: Yes    D/C Recommendations: Ongoing speech therapy is recommended during this hospitalization       ASSESSMENT    Dysphagia Diagnosis: Moderate to severe pharyngeal stage dysphagia    Patient with severe oral dysphagia and high risk for pharyngeal dysphagia due to history of oral cancer. He is s/p glossectomy and mandibular graft which significantly impacts oral stage of swallow. Primarily uses PEG  for nutritional intake, but will consume thin liquid for pleasure with use of syringe.     He demonstrated independently use of syringe for thin liquid consumption. Posterior placement of syringe bypasses oral phase of swallow and directs liquids directly into pharynx. Slightly delayed swallow initiation upon palpation, but will require instrumental assessment to fully assess. No overt s/sx of airway compromise observed with trials.      Patient is certainly at high risk for aspiration given his history of dysphagia and oral cancer. Recommend use of PEG for nutrition/hydration/medication. OK for WATER ONLY via syringe until instrumental assessment is completed on 05/31/21. He verbalizes understanding and agreement with plan. Of note, patient's current oral syringe is discolored. Hygiene concerns discussed with RN who is going to attempt to find appropriate replacement for his current equipment.       GENERAL    History of Present Injury/Illness: Caleb Robinson  has a past medical history of Chronic obstructive pulmonary disease ([place]), Emphysema lung ([place]), HTN (hypertension), and Tongue cancer ([place]).. He also  has a past surgical history that includes hernia repair (05/2019).    Chart Reviewed: Yes  Subjective: Patient pleasant and cooperative in session. Asking for water. "You just don't know how good that is"  Behavior/Cognition: Alert  Patient Position: Upright in bed      Prior Dysphagia History: PEG dependent s/p oral cancer treatment. Continues to consume thin liquids for pleasure with use of syringe for posterior placement.     Current Diet : NPO  Current Liquid Diet : NPO (Thin liquids via syringe for pleasure at home.)    Pain:   Patient does not c/o pain  Vision: Within Functional Limits            Hearing: Within functional limits         Communication Observation: Functional  Follows Directions: Complex  Respiratory Status: O2 via nasual cannula    OBJECTIVE    Oral Motor Exam  Patient  Positioning: Upright in bed   Oral Motor   Labial: Decreased seal  Dentition: Edentulous  Oral Hygiene: Moist;Clean  Lingual:  (S/p total glossectomy)  Dentition: Edentulous     Baseline Vocal Quality: Dysphonic    Oropharyngeal Phase:     Assessment Method(s): Observation;Palpation  Patient Position: Upright in bed  Consistency Presented: Thin  How Presented: Self-fed/presented (Syringe- posterior placement)  Bolus Acceptance: No impairment  Bolus Formation/Control: No impairment (Functional with use of syringe. No anterior spillage observed with thin liquid trials)  Propulsion:  (Absent propulsion independently. Functional with posterior bolus placement via syringe)  Oral Residue: None  Initiation of Swallow: Delayed (# of seconds)  Laryngeal Elevation: Decreased  Aspiration Signs/Symptoms: None  Pharyngeal Phase Characteristics: No impairment, issues, or problems        Oral Phase - Comment: Severe oral impairment due to post-operative changes including total glossectomy and manibular bone graft. However, oral phase is functional with use of compensatory strategies (posterior placement of syringe for consumption of thin liquids).  Pharyngeal Phase: High risk for pharyngeal phase impairment    PLAN    Duration/Frequency: Continue to follow patient 2x/week for duration of hospitalization and/or until goals met    Dysphagia Outcome and Severity Scale (DOSS)  Dysphagia Outcome Severity Scale: Level 2: Moderate Severe dysphagia- Maximum assistance or maximum use of strategies with partial PO only  Interpretation of Tool: The Dysphagia Outcome and Severity Scale (DOSS) is a simple, easy-to-use, 7-point scale developed to systematically rate the functional severity of dysphagia based on objective assessment and make recommendations for diet level, independence level, and type of nutrition. Normal(7), Functional(6), Mild(5), Mild-Moderate(4), Moderate(3), Moderate-Severe(2), Severe(1)    Speech Therapy  Prognosis  Prognosis: Good  Prognosis Considerations: Co-Morbidities;Medical Diagnosis;Medical Prognosis;Previous Level of Function    Education: Patient,RN,Physician  Patient Education: Role of SLP, Plan for MBS  Patient Education Response: Verbalizes understanding    Current Medications:   No current facility-administered medications on file prior to encounter.     Current Outpatient Medications on File Prior to Encounter   Medication Sig Dispense Refill   ??? sertraline (ZOLOFT) 20 MG/ML concentrated solution Take 2.5 mLs by mouth daily 2.5 mL DAILY (route: oral) 75 mL 5   ??? ibuprofen (ADVIL;MOTRIN) 800 MG tablet Take 1 tablet by mouth twice daily as needed for pain 60 tablet 0   ??? oxyCODONE HCl (OXY-IR) 10 MG immediate release tablet Take 1 tablet by mouth every 6 hours as needed for Pain for up to 30 days. 120 tablet 0   ??? albuterol sulfate HFA 108 (90 Base) MCG/ACT inhaler Inhale 2 puffs into the lungs every 6 hours as needed     ??? budesonide (PULMICORT) 0.5 MG/2ML nebulizer suspension Inhale 500 mcg into the lungs 2 times daily     ??? chlorhexidine (PERIDEX) 0.12 % solution 10 mL 4 TIMES DAILY (route: mucous membrane)     ??? vitamin D 25 MCG (1000 UT) CAPS Take by mouth daily     ??? diphenoxylate-atropine (LOMOTIL) 2.5-0.025 MG per tablet Take 1 tablet by mouth 4 times daily as needed.     ??? fluticasone (FLONASE) 50 MCG/ACT nasal spray 2 sprays by Nasal route  daily     ??? fluticasone-umeclidin-vilant (TRELEGY ELLIPTA) 100-62.5-25 MCG/INH AEPB 2 inhalation DAILY (route: inhalation)     ??? gabapentin (NEURONTIN) 100 MG capsule Take 200 mg by mouth 3 times daily as needed.     ??? ipratropium-albuterol (DUONEB) 0.5-2.5 (3) MG/3ML SOLN nebulizer solution Inhale 3 mLs into the lungs every 6 hours as needed     ??? LORazepam (ATIVAN) 0.5 MG tablet Take 0.5 mg by mouth daily.     ??? traZODone (DESYREL) 50 MG tablet Take 50 mg by mouth (Patient not taking: Reported on 05/30/2021)         PRECAUTIONS/ALLERGIES: Patient has no  known allergies.   Safety Devices in place: Yes  Type of devices: Left in bed,Nurse notified    Therapy Time  SLP Individual Minutes  Time In: 0827  Time Out: 4967  Minutes: [address], [place], [place]  05/30/2021 9:01 AM

## 2021-05-30 NOTE — Progress Notes (Signed)
Discussed obtaining sputum from pt for routine cultures and pt states he is unable to expectorate into a specimen cup and states we are not to NTS him for sputum collection leaving [place] no option to collect this ordered sample.

## 2021-05-30 NOTE — Progress Notes (Signed)
[facility] Pharmacokinetic Monitoring Service - Vancomycin     Caleb Robinson is a 70 y.o. male starting on vancomycin therapy for HAP. Pharmacy consulted by Dr Arnette Norris for monitoring and adjustment.    Target Concentration: Goal AUC/MIC 400-600 mg*hr/L    Additional Antimicrobials: Zosyn    Pertinent Laboratory Values:   Wt Readings from Last 1 Encounters:   05/30/21 89 lb 9.6 oz (40.6 kg)     Temp Readings from Last 1 Encounters:   05/30/21 98.1 ??F (36.7 ??C) (Oral)     Recent Labs     05/29/21  1643 05/29/21  1945 05/30/21  0157 05/30/21  0333 05/30/21  0335   BUN  --  33*  --   --  31*   CREATININE  --  0.80  --   --  0.70*   WBC 16.7*  --   --  15.2*  --    PROCAL  --  31.02*  --   --   --    LACACIDPL 2.1*  --  0.6  --  0.7     Estimated Creatinine Clearance: 57 mL/min (A) (based on SCr of 0.7 mg/dL (L)).    No results found for: Pandora Leiter    MRSA Nasal Swab: showed MRSA positive result on 04/14/21.    Plan:  1. Dosing recommendations based on Bayesian software  2. Start vancomycin 1000mg  q18h.  a. Anticipated AUC of 530 and trough concentration of 14.9 at steady state  3. Renal labs as indicated   4. Vancomycin concentrations will be ordered as clinically appropriate   5. Pharmacy will continue to monitor patient and adjust therapy as indicated    Thank you for the consult,  Joaquim Nam, [facility]

## 2021-05-30 NOTE — Progress Notes (Signed)
TRANSFER - IN REPORT:    Verbal report received from [place] RN(name) on Caleb Robinson  being received from 212(unit) for ordered procedure      Report consisted of patient???s Situation, Background, Assessment and   Recommendations(SBAR).     Information from the following report(s) Pre Procedure Checklist was reviewed with the receiving nurse.    Opportunity for questions and clarification was provided.      Assessment completed upon patient???s arrival to unit and care assume

## 2021-05-30 NOTE — Progress Notes (Signed)
Comprehensive Nutrition Assessment    Type and Reason for Visit: Initial,Positive Nutrition Screen,Consult  Malnutrition Screening Tool: Malnutrition Screen  Have you recently lost weight without trying?: 14 to 23 pounds (2 points)  Have you been eating poorly because of a decreased appetite?: Yes (1 point)  Malnutrition Screening Tool Score: 3   Tube Feeding Management (Hospitalists)    Nutrition Recommendations/Plan:   Enteral Nutrition:   Enteral Access: PEG  Formula: Standard with Fiber (Jevity 1.5 Cal)  Delivery: Bolus feeding: Initiate Day 1 (when approved by Dr. Juliette Alcide): Bolus 1 carton (240 ml) times 4/day.  Advance to goal Day 2 ( ):Bolus 1 carton times 4/day. 0800, 1200, 1600, 2000  Water flush Initiate 100 ml before and after each bolus  Modulars: None not indicated at this time   Enteral regimen at goal to provide per 24 hours:  1420 calories (100% estimated calorie needs), 60.4 grams protein (100% estimated protein needs) and 1553ml free fluid (1.07 ml/kcal).  IV Fluids:  Continue per MD order   Nutritional Supplement Therapy:   Implement electrolyte replacement per nutrition support protocols  Replacement indicated:  None  Labs:   EN labs: BMP MWF, Mg MWF and Phos MWF.     POC Glucoses/SSI Not indicated  Meals and Snacks:  Diet per provider.      Malnutrition Assessment:  Malnutrition Status: At risk for malnutrition (Comment) (Insufficient data- need NFPE. At risk r/t TF off)  Context: Chronic Illness  Findings of clinical characteristics of malnutrition:   Energy Intake:  Mild decrease in energy intake (Comment) (no TF given since 2 days PTA)  Weight Loss:  No significant weight loss     Body Fat Loss:  Unable to assess     Muscle Mass Loss:  Unable to assess    Fluid Accumulation:  Unable to assess    Grip Strength:  Not Performed  Nutrition Assessment:  Nutrition History: Pt TF dependent via PEG. RD note from cancer center notes patient had been tolerating Nutren 1.5, 4-5 cartons per day at home. SLP  unable to consult as outpatient d/t home health services to maintain his IVF.     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 Dx throat and tongue cancer.   Nutrition Interval:  Patient family/not in room x2. Perfect serve message sent to Dr. Juliette Alcide r/t use of PEG tube- provider to communicate with RN r/t use vs replace tube.      Abdominal Status (last documented):   Last BM (including prior to admit):  (pt unable to recall),   prn meds in place.   Pertinent Medications: polyethelene glycol prn, vit D, vanc, zosyn, zofran prn, lomotil prn,   Continuous: n/a  IVF: prn NS  Pertinent Labs:   Lab Results   Component Value Date    NA 144 05/30/2021    K 4.1 05/30/2021    CL 108 05/30/2021    CO2 31 05/30/2021    BUN 31 05/30/2021    CREATININE 0.70 05/30/2021    GLUCOSE 73 05/30/2021    CALCIUM 8.5 05/30/2021    PHOS 1.9 04/16/2021    MG 2.0 04/16/2021      Nutrition Related Findings:   Unavailable for NFPE as patient was leaving room for bronch. Noted general thinness.       Current Nutrition Therapies:  Diet NPO  ADULT TUBE  FEEDING; PEG; Other Tube Feeding (specify); Nutren 1.5; Continuous; 25; No; 30; Q 3 hours    Current Intake:   Average Meal Intake: NPO        Anthropometric Measures:  Height: 5\' 4"  (162.6 cm)  Current Body Wt: 89 lb 8.1 oz (40.6 kg) (6/22), Weight source: Bed Scale  BMI: 15.4  Admission Body Weight: 198 lb 6.6 oz (90 kg) (6/21 stated)  Ideal Body Weight (Kg) (Calculated): 59 kg (130 lbs), 68.9 %  Usual Body Wt:  (89-93# per EMR an cancer center RD), Percent weight change:         Edema:Peripheral Vascular  Peripheral Vascular (WDL): Within Defined Limits   BMI Category Underweight (BMI less than 22) age over 85  Estimated Daily Nutrient Needs:  Energy (kcal/day): 1200-1400 (Kcal/kg (30-35) Weight used: 40 kg Current  Protein (g/day): 40-48 g (1-1.2 g/kg) Weight  Used: (Current) 40 kg  Fluid (ml/day):   (1 ml/kcal)    Nutrition Diagnosis:   ?? Inadequate protein-energy intake related to altered GI structure (glossectomy, tube feeding dependence) as evidenced by  (NPO, tf on hold. )    Nutrition Interventions:   Food and/or Nutrient Delivery:  (Start TF as medically appropriate)     Coordination of Nutrition Care: Continue to monitor while inpatient  Plan of Care discussed with: Maudie Mercury, RN. Dr. Juliette Alcide via perfect serve    Goals:      Active Goal: Meet at least 75% of estimated needs,by next RD assessment       Nutrition Monitoring and Evaluation:      Food/Nutrient Intake Outcomes: Enteral Nutrition Intake/Tolerance  Physical Signs/Symptoms Outcomes: Biochemical Data,GI Status,Nutrition Focused Physical Findings,Weight    Discharge Planning:    Enteral Nutrition    Georgann Housekeeper, [place], LD  Contact: [phone]

## 2021-05-30 NOTE — Progress Notes (Signed)
....  TRANSFER - OUT REPORT:    Verbal report given on  Caleb Robinson  being transferred to [facility] lab     Report consisted of patient's Situation, Background, Assessment and   Recommendations    Information from the following report  was reviewed with the receiving nurse.    Lines:   PICC Single Lumen 05/30/21 Right Brachial (Active)   Central Line Being Utilized Yes 05/30/21 0800   Criteria for Appropriate Use Long term IV/antibiotic administration 05/30/21 0800   Site Assessment Clean, dry & intact 05/30/21 0800   Phlebitis Assessment No symptoms 05/30/21 0800   Infiltration Assessment 0 05/30/21 0800   Lumen Color/Status Flushed;Brisk blood return;Infusing 05/30/21 0800   Line Care Connections checked and tightened;Ports disinfected 05/30/21 0800   Alcohol Cap Used Yes 05/30/21 0800   Date of Last Dressing Change 05/25/21 05/30/21 0800   Dressing Type Transparent;Antimicrobial 05/30/21 0800   Dressing Status Clean, dry & intact 05/30/21 0800       Single Lumen Implantable Port 05/23/21 Right Subclavian (Active)       Peripheral IV 05/29/21 Left;Lower Forearm (Active)   Site Assessment Clean, dry & intact 05/30/21 1305   Line Status Infusing 05/30/21 1305   Phlebitis Assessment No symptoms 05/30/21 1305   Infiltration Assessment 0 05/30/21 1305   Alcohol Cap Used No 05/30/21 1305   Dressing Status Clean, dry & intact 05/30/21 1305   Dressing Type Transparent 05/30/21 1305        Opportunity for questions and clarification was provided.

## 2021-05-30 NOTE — Other (Cosign Needed)
05/30/21 0225   Dual Clinician Skin Assessment   Dual Skin Assessment (4 Eyes) X   Second Clinical Assessor (First and Last Name) Rowena, RN   Pressure Injury Documentation Pressure Injury Noted-See Wound LDA to Document   Skin Integumentary    Skin Integumentary (WDL) X   Skin Color Dusky   Skin Condition/Temp Dry;Warm   Skin Integrity Wound (see LDA);Scars (comment);Other (comment)  (peg tube, tattoos)   Location left leg, sacrum   Wound Prevention/Protection Method Yes   Nails X   Multiple Skin Integrity Sites Yes   Location of Wound Prevention Sacrum;Coccyx   Orientation of Wound Prevention Posterior   Wound Offloading (Prevention Methods) Bed, pressure reduction mattress;Foam silicone   Dressing Present  Yes   Nail Condition Thick

## 2021-05-30 NOTE — Consults (Signed)
History and Physical Initial Visit NOTE           05/30/2021    Caleb Robinson                        Date of Admission:  05/29/2021    The patient's chart is reviewed and the patient is discussed with the staff.    Subjective:     Patient is a 70 y.o. Caucasian male seen and evaluated at the request of Dr. Doren Custard. 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 Pt was recently hospitalized 5/6-04/16/21 for acute on chronic respiratory failure and aspiration pneumonia. He was seen by his PCP on 6/21 and found to have a fever and diarrhea. Pt was admitted by the hospitalist service and we were consulted for therapeutic bronch.   Pt reports that he continues to smoke 2ppd. He has been coughing but is unable to cough anything up secondary to tongue resection. He reports that he is not on O2 at home. He does use Trelegy and albuterol at home. He has been using his PEG without issue although H&P states that his PEG hasn't been working well for 2 days. We discussed need for Bronch and pt is agreeable. He is NPO currently.     Review of Systems  Constitutional: positive for fatigue  Eyes: positive for contacts/glasses  Ears, nose, mouth, throat, and face: positive for sore mouth, sore throat, voice change and s/p tongue resection  Respiratory: positive for cough and dyspnea on exertion  Cardiovascular: negative  Gastrointestinal: positive for PEG in place  Genitourinary:positive for frequency  Hematologic/lymphatic: negative  Musculoskeletal:positive for back pain, bone pain and stiff joints  Neurological: negative  Behavioral/Psych: positive for tobacco use  Endocrine: negative  Allergic/Immunologic: negative    Current Outpatient Medications   Medication Instructions   ??? albuterol sulfate HFA 108 (90 Base) MCG/ACT inhaler 2 puffs, Inhalation, EVERY 6 HOURS PRN   ???  budesonide (PULMICORT) 500 mcg, Inhalation, 2 TIMES DAILY   ??? chlorhexidine (PERIDEX) 0.12 % solution 10 mL 4 TIMES DAILY (route: mucous membrane)   ??? diphenoxylate-atropine (LOMOTIL) 2.5-0.025 MG per tablet 1 tablet, Oral, 4 TIMES DAILY PRN   ??? fluticasone (FLONASE) 50 MCG/ACT nasal spray 2 sprays, Nasal, DAILY   ??? fluticasone-umeclidin-vilant (TRELEGY ELLIPTA) 100-62.5-25 MCG/INH AEPB 2 inhalation DAILY (route: inhalation)   ??? gabapentin (NEURONTIN) 200 mg, Oral, 3 TIMES DAILY PRN   ??? ibuprofen (ADVIL;MOTRIN) 800 MG tablet Take 1 tablet by mouth twice daily as needed for pain   ??? ipratropium-albuterol (DUONEB) 0.5-2.5 (3) MG/3ML SOLN nebulizer solution 3 mLs, Inhalation, EVERY 6 HOURS PRN   ??? LORazepam (ATIVAN) 0.5 mg, Oral, DAILY   ??? oxyCODONE HCl (OXY-IR) 10 mg, Oral, EVERY 6 HOURS PRN   ??? sertraline (ZOLOFT) 50 mg, Oral, DAILY, 2.5 mL DAILY (route: oral)   ??? traZODone (DESYREL) 50 mg   ??? vitamin D 25 MCG (1000 UT) CAPS Oral, DAILY      Past Medical History:   Diagnosis Date   ??? Chronic obstructive pulmonary disease ([place])     follows with Pulmonology.  daily and rescure inhaler. no home O2.   ??? Emphysema lung ([place])    ??? HTN (hypertension)    ??? Tongue cancer [facility])      Past Surgical History:   Procedure Laterality Date   ???  HERNIA REPAIR  05/2019     Social History     Socioeconomic History   ??? Marital status: Single     Spouse name: Not on file   ??? Number of children: Not on file   ??? Years of education: Not on file   ??? Highest education level: Not on file   Occupational History   ??? Not on file   Tobacco Use   ??? Smoking status: Current Every Day Smoker     Packs/day: 0.75   ??? Smokeless tobacco: Never Used   ??? Tobacco comment: Quit smoking: stopped but restarted Dec 2019   Substance and Sexual Activity   ??? Alcohol use: Yes     Alcohol/week: 1.0 standard drink   ??? Drug use: Never   ??? Sexual activity: Not on file     Comment: widow   Other Topics Concern   ??? Not on file   Social History Narrative   ??? Not on file      Social Determinants of Health     Financial Resource Strain:    ??? Difficulty of Paying Living Expenses: Not on file   Food Insecurity:    ??? Worried About Running Out of Food in the Last Year: Not on file   ??? Ran Out of Food in the Last Year: Not on file   Transportation Needs:    ??? Lack of Transportation (Medical): Not on file   ??? Lack of Transportation (Non-Medical): Not on file   Physical Activity:    ??? Days of Exercise per Week: Not on file   ??? Minutes of Exercise per Session: Not on file   Stress:    ??? Feeling of Stress : Not on file   Social Connections:    ??? Frequency of Communication with Friends and Family: Not on file   ??? Frequency of Social Gatherings with Friends and Family: Not on file   ??? Attends Religious Services: Not on file   ??? Active Member of Clubs or Organizations: Not on file   ??? Attends Archivist Meetings: Not on file   ??? Marital Status: Not on file   Intimate Partner Violence:    ??? Fear of Current or Ex-Partner: Not on file   ??? Emotionally Abused: Not on file   ??? Physically Abused: Not on file   ??? Sexually Abused: Not on file   Housing Stability:    ??? Unable to Pay for Housing in the Last Year: Not on file   ??? Number of Places Lived in the Last Year: Not on file   ??? Unstable Housing in the Last Year: Not on file     Family History   Problem Relation Age of Onset   ??? Dementia Mother      No Known Allergies  Objective:   Blood pressure (!) 92/53, pulse 93, temperature 98.3 ??F (36.8 ??C), temperature source Oral, resp. rate 18, height 5\' 4"  (1.626 m), weight 89 lb 9.6 oz (40.6 kg), SpO2 94 %. No intake or output data in the 24 hours ending 05/30/21 0854  PHYSICAL EXAM   Constitutional:  the patient is well developed and in no acute distress, on RA  EENMT:  Sclera clear, pupils equal, oral mucosa moist  Respiratory: coarse breath sounds at bases  Cardiovascular:  RRR without M,G,R  Gastrointestinal: soft and non-tender; with positive bowel sounds.  Musculoskeletal: warm without  cyanosis. There is no lower extremity edema.  Skin:  no jaundice or rashes  Neurologic: no gross neuro deficits     Psychiatric:  alert and oriented x 3    CXR:      CT Chest: 04/14/21:              Recent Labs     05/29/21  1643 05/29/21  1945 05/30/21  0333 05/30/21  0335   WBC 16.7*  --  15.2*  --    HGB 9.8*  --  9.1*  --    HCT 31.1*  --  28.8*  --    PLT 231  --  249  --    NA  --  141  --  144   K  --  4.4  --  4.1   CL  --  104  --  108*   CO2  --  31  --  31   BUN  --  33*  --  31*   BILITOT  --  0.4  --  0.3   0.3   AST  --  41*  --  34   34   ALT  --  25  --  22   22   ALKPHOS  --  115  --  83   83   TROPHS  --   --   --  14.2*     ECHO: No results found for this or any previous visit.    MICRO:   Recent Labs     05/29/21  1945   CULTURE NO GROWTH AFTER 11 HOURS   NO GROWTH AFTER 11 HOURS     Assessment and Plan:  (Medical Decision Making)   Principal Problem:    Acute respiratory failure with hypoxemia ([place])  Plan: wean O2 as tolerated, may need O2 at discharge   Active Problems:    Anemia  Plan: monitor     Emphysema lung ([place])  Plan: continue nebs/trelegy    Tongue cancer ([place])  Plan: s/p resection and treatment    Hyperlipidemia  Plan: chronic     Anxiety and depression  Plan: chronic     Aspiration pneumonia ([place])  Plan: plans for therapeutic bronch with BAL today, pt NPO.  Pt currently on Zosyn    Severe protein-calorie malnutrition ([place])  Plan: continue TFs once bronch completed       Full Code    Thank you very much for this referral.  We appreciate the opportunity to participate in this patient's care.  Will follow along with above stated plan.    In this split/shared evaluation I performed counseled and educated the patient and/or family member, ordered medications, tests or procedures, documented information in EMR and coordinated care. which accounted for 18 clinical time.     Rayna Sexton, PA

## 2021-05-30 NOTE — Progress Notes (Signed)
PHYSICAL THERAPY Initial Assessment, Daily Note and AM  (Link to Caseload Tracking: PT Visit Days : 1  Acknowledge Orders   Time In/Out   PT Charge Capture   Rehab Caseload Tracker    Caleb Robinson is a 70 y.o. male   PRIMARY DIAGNOSIS: Acute respiratory failure with hypoxemia (HCC)  Hypoxia [R09.02]  Acute respiratory failure with hypoxemia ([place]) [J96.01]  Pneumonia of both lower lobes due to infectious organism [J18.9]  Procedure(s) (LRB):  BRONCHOSCOPY (N/A)     Reason for Referral: Generalized Muscle Weakness (M62.81)  Other abnormalities of gait and mobility (R26.89)  Inpatient: Payor: [employer] MEDICARE / Plan: [place] / Product Type: *No Product type* /     ASSESSMENT:     REHAB RECOMMENDATIONS:   Recommendation to date pending progress:  Setting:  ??? No further skilled therapy after discharge from hospital    Equipment:   ???  None     ASSESSMENT:  Caleb Robinson  is a 70 year old M who presents with respiratory failure. At baseline, pt is independent with ADL's and mobility. PMHx includes COPD, throat and tongue cancer. This date pt performs mobility including bed mobility with supervision. Pt received on room air, SpO2 at 88% after ambulating to toilet and back to bed. Pt placed on 2L, SpO2 91% throughout 80 ft of ambulation with supervision. Pt transferred to chair with supervision. Pt noted to have B LE muscle atrophy and generalized weakness. Pt overall moving well and close to baseline, will follow for strength and endurance deficits but likely no PT needs at d/c. Pt will benefit from skilled therapy services to address stated deficits to promote return to highest level of function, independence, and safety. Will continue to follow.     Caleb Robinson??? ???6 Clicks??? Basic Mobility Inpatient Short Form  AM-PAC Mobility Inpatient   How much difficulty turning over in bed?: None  How much difficulty sitting down on / standing up from a chair with arms?: None  How much difficulty  moving from lying on back to sitting on side of bed?: None  How much help from another person moving to and from a bed to a chair?: None  How much help from another person needed to walk in hospital room?: None  How much help from another person for climbing 3-5 steps with a railing?: A Little  AM-PAC Inpatient Mobility Raw Score : 23  AM-PAC Inpatient T-Scale Score : 56.93  Mobility Inpatient CMS 0-100% Score: 11.2  Mobility Inpatient CMS G-Code Modifier : CI    SUBJECTIVE:   Caleb Robinson states, "thank you"     Social/Functional Lives With: Other (comment) (ex-wife)  Type of Home: Apartment  Home Layout: One level  Home Access: Level entry  Has the patient had two or more falls in the past year or any fall with injury in the past year?: No  ADL Assistance: Independent  Ambulation Assistance: Independent  Transfer Assistance: Independent  Active Driver: Yes    OBJECTIVE:     PAIN: VITALS / O2: PRECAUTION / LINES / DRAINS:   Pre Treatment:   Pain Assessment: None - Denies Pain  Pain Level: 0      Post Treatment: 0 Vitals        Oxygen  SpO2: (!) 88 % (ambulation on room air. 91% with 2L)   None    RESTRICTIONS/PRECAUTIONS:  GROSS EVALUATION: B LE Intact Impaired (Comments):   AROM [x]      PROM [x]     Strength []   generalized weakness, muscle atrophy   Balance []  Posture: Good  Sitting - Static: Good  Sitting - Dynamic: Good  Standing - Static: Good  Standing - Dynamic: Fair,+   Posture []  Forward Head  Rounded Shoulders   Sensation []      Coordination []       Tone []      Edema []     Activity Tolerance []  Patient Tolerated treatment well    []       COGNITION/  PERCEPTION: Intact Impaired (Comments):   Orientation [x]      Vision [x]      Hearing [x]      Cognition  [x]        MOBILITY: I Mod I S SBA CGA Min Mod Max Total  NT x2 Comments:   Bed Mobility    Rolling []  []  [x]  []  []  []  []  []  []  []  []     Supine to Sit []  []  [x]  []  []  []  []  []  []  []  []     Scooting []  []  [x]  []  []  []  []  []  []  []  []     Sit to  Supine []  []  []  []  []  []  []  []  []  [x]  []     Transfers    Sit to Stand []  []  [x]  []  []  []  []  []  []  []  []     Bed to Chair []  []  [x]  []  []  []  []  []  []  []  []     Stand to Sit []  []  [x]  []  []  []  []  []  []  []  []      []  []  []  []  []  []  []  []  []  []  []     I=Independent, Mod I=Modified Independent, S=Supervision, SBA=Standby Assistance, CGA=Contact Guard Assistance,   Min=Minimal Assistance, Mod=Moderate Assistance, Max=Maximal Assistance, Total=Total Assistance, NT=Not Tested    GAIT: I Mod I S SBA CGA Min Mod Max Total  NT x2 Comments:   Level of Assistance []  []  [x]  []  []  []  []  []  []  []  []     Distance 80 feet    DME None    Gait Quality Decreased cadence     Weightbearing Status      Stairs      I=Independent, Mod I=Modified Independent, S=Supervision, SBA=Standby Assistance, CGA=Contact Guard Assistance,   Min=Minimal Assistance, Mod=Moderate Assistance, Max=Maximal Assistance, Total=Total Assistance, NT=Not Tested    PLAN:   ACUTE PHYSICAL THERAPY GOALS:   (Developed with and agreed upon by patient and/or caregiver.)    (1.) Caleb Robinson  will move from supine to sit and sit to supine  with INDEPENDENCE within 7 treatment day(s).    (2.) Caleb Robinson will transfer from bed to chair and chair to bed with INDEPENDENCE using the least restrictive device within 7 treatment day(s).    (3.) Caleb Robinson will ambulate with INDEPENDENCE for 500 feet with the least restrictive device while maintaining SpO2 >90% within 7 treatment day(s).   (4.) Caleb Robinson will perform standing static and dynamic balance activities x 20 minutes with SUPERVISION to improve safety within 7 treatment day(s).  (5.) Caleb Robinson will perform bilateral lower extremity exercises x 20 min for HEP with INDEPENDENCE to improve strength, endurance, and functional mobility within 7 treatment day(s).       FREQUENCY AND DURATION: 2 times/week for duration of hospital stay or until stated goals are  met, whichever comes first.    THERAPY PROGNOSIS: Good  PROBLEM LIST:   (Skilled intervention is medically necessary to address:)  Decreased Activity Tolerance  Decreased Balance  Decreased Gait Ability  Decreased Strength  Decreased Transfer Abilities INTERVENTIONS PLANNED:   (Benefits and precautions of physical therapy have been discussed with the patient.)  Therapeutic Activity  Therapeutic Exercise/HEP  Neuromuscular Re-education  Gait Training  Education       TREATMENT:   EVALUATION: LOW COMPLEXITY: (Untimed Charge)    TREATMENT:   Therapeutic Activity (13 Minutes): Therapeutic activity included Rolling, Supine to Sit, Scooting, Transfer Training, Ambulation on level ground, Sitting balance  and Standing balance to improve functional Activity tolerance, Balance, Mobility and Strength.    TREATMENT GRID:  N/A    AFTER TREATMENT PRECAUTIONS: Bed/Chair Locked, Call light within reach, Chair, Needs within reach and RN notified    INTERDISCIPLINARY COLLABORATION:  RN/ PCT and OT/ COTA    EDUCATION: Education Given To: Patient  Education Provided: Role of Therapy;Plan of Airline pilot;Fall Prevention Strategies  Education Method: Verbal  Barriers to Learning: None  Education Outcome: Verbalized understanding    TIME IN/OUT:  Time In: 0938  Time Out: 9476  Minutes: 21    Constance Whittle, PT

## 2021-05-30 NOTE — Progress Notes (Signed)
Pt resting in bed. S/P Bronch. Pt very lethargic but easily to awake. Pt returned to the unit with 4L O2 on as it was out on in the procedure room due to sedation. Wound care nurse assessed pt, see notes. Nurse reached out to MD regarding Peg tube may not be working per patient. GI consulted. According to Bronch notes, states that pt is at risk for aspiration. All medications were held on this shift by the nurse for safety.  MD aware.  No other needs at this time, call light within reach.

## 2021-05-30 NOTE — Wound Image (Signed)
Left leg with scar and small dry scab 4x0.6cm recommend small foam bandage every other day and prn. Sacrum with blanchable erythema offloading and silicone border should be continued. Will monitor.

## 2021-05-30 NOTE — Progress Notes (Signed)
ASSESSMENT NOTE    Attending Physician: Helmut Muster, MD  Admit Problem: Hypoxia [R09.02]  Acute respiratory failure with hypoxemia ([place]) [J96.01]  Pneumonia of both lower lobes due to infectious organism [J18.9]  Date/Time of Admission: 05/29/2021  6:36 PM  Problem List:  Patient Active Problem List   Diagnosis   ??? Emphysema lung ([place])   ??? Dehydration   ??? Vision loss   ??? Tongue cancer ([place])   ??? History of tobacco abuse   ??? Centrilobular emphysema ([place])   ??? Unstable angina ([place])   ??? Myxomatous mitral valve   ??? Acute respiratory failure with hypoxia ([place])   ??? Hyperlipidemia   ??? Anxiety and depression   ??? COPD with acute lower respiratory infection ([place])   ??? Aspiration pneumonia ([place])   ??? Severe protein-calorie malnutrition ([place])   ??? Acute respiratory failure with hypoxemia ([place])   ??? Anemia       Service Assessment  Patient Orientation Alert and Oriented   Cognition Alert   History Provided By Patient   Primary Caregiver     Accompanied By/Relationship     Support Systems Friends/Neighbors,Spouse/Significant Other (ex-spouse:  Broadus Costilla   [phone])   [place] is: Legal Next of Kin   PCP Verified by CM No (Dr [name]  [phone])   Last Visit to PCP     Prior Functional Level Independent in ADLs/IADLs   Current Functional Level     Can patient return to prior living arrangement Yes   Ability to make needs known: Good   Family able to assist with home care needs: Yes   Would you like for me to discuss the discharge plan with any other family members/significant others, and if so, who?     Manufacturing engineer     CM/SW Referral       Social/Functional History  Lives With Other (comment) (ex-wife)   Type of Home Apartment   Home Layout One level   Home Access Level entry   Entrance Stairs - Number of Steps     Entrance Stairs - Rails     Bathroom Shower/Tub     Doctor, hospital     Receives  Help From [place])   ADL Assistance Independent   Runner, broadcasting/film/video Assistance     Meal Prep     Laundry     Vacuuming     Physiological scientist Work     Tourist information centre manager     Child Care     Other (Comment)     [place] Paying/Finance [place] Management     Other (Comment)     Ambuation Assistance Independent   Transfer Assisstance Independent   Active Driver Yes   Patient's Driver Info     Mode of Scientist, product/process development   Education     Occupation On disability   Type of Occupation  Discharge Planning   Type of Residence [place] Friends/Neighbors,Spouse/Significant Other (ex-spouse:  Jimy Gates   [phone])   Current Services Prior To Admission Home Infusion   Potential Assistance Needed Outpatient IV   DME     DME     DME Ordered? Enteral feedings   Potential Assistance Purchasing Medications No   Meds-to-Beds: Does the patient want to have any new prescriptions delivered to bedside prior to discharge?     Type of Home Care Services OT,PT,IV Therapy   Patient expects to be discharged to: Apartment   Follow Up Appointment: Best Day/Time Monday AM   One/Two Story Residence: One story   # of Interior Steps     Height of Each Step (in)     Yahoo Available     History of Falls? Yes     Services At/After Discharge  Transition of Care Consult (CM Consult): Discharge Planning   Internal Home Health     Internal Hospice     Reason Outside Agency [place]     Partner SNF     Reason Why Partner SNF Not Chosen     Internal Comfort Care     Reason Outside [place] Discharge None   Veteran Resource Information Provided? No   Mode of  Transport at Discharge [place] Time of Discharge     Confirm Follow Up Transport Self     Condition of Participation: Discharge Planning  The plan for Transition of Care is related to the following treatment goals: return to baseline   The Patient and/or Patient Representative was provided with a Choice of Provider? Patient   Name of the Patient Representative who was provided with the Choice of Provider and agrees with the Discharge Plan?     The Patient and/or Patient Representative Agree with the Discharge Plan? Yes   Freedom of Choice list was provided with basic dialogue that supports the individualized plan of care/goals, treatment preferences, and shares the quality data associated with the providers? Yes     Documentation for Discharge Appeal  Discharge Appealed by     Date notified by [facility] of appeal request:     Time notified by [facility] of appeal request:     Detailed Notice of Discharge given to:     Date Notice of Discharge given:     Time Notice of Discharge given:     Date records sent to [place]     Time records sent to [place]     Date Notified of Outcome     Time Notified of Outcome     Outcome of appeal           Leotis Shames, RN 05/30/21 2:32 PM

## 2021-05-30 NOTE — ED Notes (Signed)
Report to [place], RN     Marc Morgans, RN  05/30/21 [phone]

## 2021-05-30 NOTE — Progress Notes (Signed)
Gastroenterology    Patient seen and examined. He currently has a 24 Fr, 2.3 cm low profile Mic-key feeding tube.  Upon review of the inventory in the GI lab, it was found that we do not currently have this size.  I will arrange an outpatient appointment in 2 weeks at which time his feeding tube will be replaced.  He reports that it is functioning adequately for now.    Will be available as needed.    Shellia Carwin, MD

## 2021-05-30 NOTE — Plan of Care (Signed)
Problem: Respiratory - Adult  Goal: Achieves optimal ventilation and oxygenation  Outcome: Progressing

## 2021-05-30 NOTE — Progress Notes (Signed)
All oral medications held, pt unable to take oral medications.

## 2021-05-30 NOTE — Wound Image (Signed)
Attempt wound care patient off unit to Endo. Will attempt again later today or tomorrow as time allows.

## 2021-05-30 NOTE — Progress Notes (Signed)
OCCUPATIONAL THERAPY Initial Assessment, Daily Note, Discharge and AM       OT Visit Days: 1  Acknowledge Orders   Time   OT Charge Capture   Rehab Caseload Tracker      Caleb Robinson is a 70 y.o. male   PRIMARY DIAGNOSIS: Acute respiratory failure with hypoxemia (HCC)  Hypoxia [R09.02]  Acute respiratory failure with hypoxemia ([place]) [J96.01]  Pneumonia of both lower lobes due to infectious organism [J18.9]  Procedure(s) (LRB):  BRONCHOSCOPY (N/A)  Day of Surgery  Reason for Referral: Generalized Muscle Weakness (M62.81)  Inpatient: Payor: [employer] MEDICARE / Plan: [place] / Product Type: *No Product type* /     ASSESSMENT:     REHAB RECOMMENDATIONS:   Recommendation to date pending progress:  Setting:  ??? No further skilled therapy after discharge from hospital    Equipment:   ???  None     ASSESSMENT:  Caleb Robinson is admitted for the above diagnoses and presents with overall deficits in strength, functional mobility and ADL performance. At baseline, he lives with ex-wife in a 1 level apartment with no steps to enter. He reports independence with ADLs and independence with mobility. Today, he was able to perform bed mobility, MMT, functional household mobility around room, toileting at standard toilet and functional transfers with little to no assistance. He required verbal cues for better posture and deep breathing techniques. Presents with increased need for oxygen from his baseline of no O2. Otherwise he is functioning at his functional baseline. He was left in recliner with all needs met and in reach. No further skilled OT indicated at this time. Anticipate pt will benefit from family support as needed upon discharge.      [employer]??? ???6 Clicks??? Daily Activity Inpatient Short Form:    AM-PAC Daily Activity Inpatient   How much help for putting on and taking off regular lower body clothing?: None  How much help for Bathing?: None  How much help for Toileting?: None  How much  help for putting on and taking off regular upper body clothing?: None  How much help for taking care of personal grooming?: None  How much help for eating meals?: None  AM-PAC Inpatient Daily Activity Raw Score: 24  AM-PAC Inpatient ADL T-Scale Score : 57.54  ADL Inpatient CMS 0-100% Score: 0  ADL Inpatient CMS G-Code Modifier : CH           SUBJECTIVE:     Caleb Robinson states, "I need to use the bathroom"     Social/Functional Lives With: Other (comment) (ex-wife)  Type of Home: Apartment  Home Layout: One level  Home Access: Level entry  Has the patient had two or more falls in the past year or any fall with injury in the past year?: No  ADL Assistance: Independent  Ambulation Assistance: Independent  Transfer Assistance: Independent  Active Driver: Yes    OBJECTIVE:     LINES / DRAINS / AIRWAY: None    RESTRICTIONS/PRECAUTIONS:       PAIN: VITALS / O2:   Pre Treatment:   Pain Assessment: None - Denies Pain  Pain Level: 0      Post Treatment: no change       Vitals          Oxygen   88% on RA during ambulation  91% on 2L NC       GROSS EVALUATION: INTACT IMPAIRED   (See Comments)   UE AROM [x]  []   UE PROM [x]  []    Strength []    generalized weakness     Posture / Balance []   cues for good posture in sitting   Sensation [x]      Coordination [x]        Tone [x]        Edema [x]     Activity Tolerance []    decreased     Hand Dominance R []  L []       COGNITION/  PERCEPTION: INTACT IMPAIRED   (See Comments)   Orientation [x]      Vision [x]      Hearing [x]      Cognition  [x]      Perception [x]        MOBILITY: I Mod I S SBA CGA Min Mod Max Total  NT x2 Comments:   Bed Mobility    Rolling []  []  []  []  []  []  []  []  []  []  []     Supine to Sit [x]  []  []  []  []  []  []  []  []  []  []     Scooting []  []  []  []  []  []  []  []  []  []  []     Sit to Supine [x]  []  []  []  []  []  []  []  []  []  []     Transfers    Sit to Stand [x]  []  []  []  []  []  []  []  []  []  []     Bed to Chair [x]  []  []  []  []  []  []  []  []  []  []     Stand to Sit [x]  []  []  []  []  []  []  []  []  []   []     Tub/Shower []  []  []  []  []  []  []  []  []  []  []      Toilet [x]  []  []  []  []  []  []  []  []  []  []       []  []  []  []  []  []  []  []  []  []  []     I=Independent, Mod I=Modified Independent, S=Supervision/Setup, SBA=Standby Assistance, CGA=Contact Guard Assistance, Min=Minimal Assistance, Mod=Moderate Assistance, Max=Maximal Assistance, Total=Total Assistance, NT=Not Tested    ACTIVITIES OF DAILY LIVING: I Mod I S SBA CGA Min Mod Max Total NT Comments   BASIC ADLs:              Upper Body Bathing  []  []  []  []  []  []  []  []  []  []     Lower Body Bathing []  []  []  []  []  []  []  []  []  []     Toileting [x]  []  []  []  []  []  []  []  []  []     Upper Body Dressing []  []  []  []  []  []  []  []  []  []     Lower Body Dressing [x]  []  []  []  []  []  []  []  []  []     Feeding []  []  []  []  []  []  []  []  []  []     Grooming [x]  []  []  []  []  []  []  []  []  []     Personal Device Care []  []  []  []  []  []  []  []  []  []     Functional Mobility [x]  []  []  []  []  []  []  []  []  []     I=Independent, Mod I=Modified Independent, S=Supervision/Setup, SBA=Standby Assistance, CGA=Contact Guard Assistance, Min=Minimal Assistance, Mod=Moderate Assistance, Max=Maximal Assistance, Total=Total Assistance, NT=Not Tested    PLAN:     FREQUENCY/DURATION   OT Plan of Care:  (eval & dc) for duration of hospital stay or until stated goals are met, whichever comes first.    ACUTE OCCUPATIONAL THERAPY GOALS:   (Developed with and agreed upon by patient and/or caregiver.)  1. Patient will complete toileting with supervision to increase self care independence.   2. Patient will tolerate 20 minutes of OT treatment with  self incorporated rest breaks to increase activity tolerance to enhance participation in hobbies.  3. Patient will complete all functional transfers with stand by assist using adaptive equipment as needed.     Timeframe: 7 visits        PROBLEM LIST:   (Skilled intervention is medically necessary to address:)  Decreased Activity Tolerance   INTERVENTIONS PLANNED:  (Benefits and precautions of  occupational therapy have been discussed with the patient.)  Self Care Training  Therapeutic Activity  Education         TREATMENT:     EVALUATION: LOW COMPLEXITY: (Untimed Charge)    TREATMENT:   Co-Treatment PT/OT necessary due to patient's decreased overall endurance/tolerance levels, as well as need for high level skilled assistance to complete functional transfers/mobility and functional tasks  Self Care (15 minutes): Patient participated in toileting, lower body dressing and grooming ADLs in unsupported sitting and standing with minimal verbal and tactile cueing to increase independence. Patient also participated in functional mobility, functional transfer and energy conservation training to increase independence and increase activity tolerance.     TREATMENT GRID:  N/A    AFTER TREATMENT PRECAUTIONS: Bed/Chair Locked, Call light within reach, Chair, Needs within reach and RN notified    INTERDISCIPLINARY COLLABORATION:  RN/ PCT and PT/ PTA    EDUCATION:  Education Given To: Patient  Education Provided: Role of Therapy;Plan of Care;ADL Market researcher;Fall Prevention Strategies  Education Method: Demonstration;Verbal  Barriers to Learning: None  Education Outcome: Verbalized understanding;Demonstrated understanding    TOTAL TREATMENT DURATION AND TIME:  Time In: 0936  Time Out: 0938  Minutes: [place], [place]

## 2021-05-30 NOTE — Interval H&P Note (Signed)
Update History & Physical    The patient's History and Physical of May 30, 2021 was reviewed with the patient and I examined the patient. There was no change. The surgical site was confirmed by the patient and me.     Plan: The risks, benefits, expected outcome, and alternative to the recommended procedure have been discussed with the patient. Patient understands and wants to proceed with the procedure.     Electronically signed by Rogue Bussing, MD on 05/30/2021 at 1:40 PM

## 2021-05-30 NOTE — Op Note (Signed)
Operative Note      Patient: Caleb Robinson  Date of Birth: March 04, 1951  MRN: [id number]    Date of Procedure: 05/30/2021    Pre-Op Diagnosis: Aspiration into airway, initial encounter [T17.908A]  Cough  Post-Op Diagnosis: Same       Procedure(s):  BRONCHOSCOPY    Surgeon(s):  Paighton Godette El Jenne Campus, MD    Assistant:   * No surgical staff found *    Anesthesia: IV Sedation    Estimated Blood Loss (mL): none    Complications: None    Specimens:   * No specimens in log *    Implants:  * No implants in log *      Drains:   Gastrostomy/Enterostomy/Jejunostomy Tube Percutaneous Endoscopic Gastrostomy (PEG) LLQ (Active)   Dressing Status New dressing applied 05/30/21 0730   Dressing Type Dry dressing 05/30/21 0730       Findings: small amount of mucus with no mucus plugging, rtemoved with sdaline and bronchoscopic suction without complication    Detailed Description of Procedure:   PROCEDURE    Bronchoscopy with airway inspection/cleanout/BAL.TBBX/EBBX.    INDICATION   Cough/ dysphagia/aspiration into airway    EQUIPMENT:  Olympus T 180 Bronchhoscope.      ANESTHESIA    Concious sedation with: Fentanyl 75 mcg IV; Versed1mg  IV; Lidocaine 180 mg to tracheo-bronchial tree and vocal cords;    AIRWAY INSPECTION    After obtaining informed consent, using a bite block/ET tube adapter, an Olympus T 180 video bronchoscope was  introduced into the trachea through the vocal chords/ET tube, without complication.         RIGHT    LOCATION NORM/ABNORM DESCRIPTION   VOCAL CORDS NL    TRACHEA NL Small amount of mucus on L and R side without significant mucus plugging noted, no tumors or foreign bodies noted in all inspected airways to their 3rd-4th generation  Mucus removed with aid of saline and bronchoscopic suction without complication   CARINA NL    RMSB NL    RUL NL    BI NL    RML NL    SUP SEGM RLL NL    MED BASAL NL    ANTERIOR BASAL NL    LATERAL BASAL NL    POSTERIOR BASAL NL                                               LEFT    LOCATION NORMAL/ABNORMAL TYPE   LMSB NL    LUL NL    LINGULA NL    SUPERIOR DIVISION NL    SUPERIOR SEG LLL NL    ANTERO-MEDIAL LLL NL    LATERAL LLL NL    POSTERIOR LLL NL      The following samples were obtained:    Bronchial Wash: R and L lung    Samples were sent for:  Routine cultures.    The procedure was completed  without complication and the patient tolerated the procedure well.    EBL: none    Recommendations:  Aspiration precautions  FV Rx  Fabienne Nolasco Burt Knack, MD          Electronically signed by Rogue Bussing, MD on 05/30/2021 at 1:48 PM

## 2021-05-30 NOTE — Progress Notes (Signed)
SBAR given to primary RN on this pt s/p Bronch/BAL. Opportunities for questions and clarification given. Transport submitted for this pt.

## 2021-05-30 NOTE — Progress Notes (Signed)
VANCO DAILY FOLLOW UP NOTE  [place] Pharmacokinetic Monitoring Service - Vancomycin    Consulting Provider: Dr. Doren Custard   Indication: HAP  Target Concentration: Goal AUC/MIC 400-600 mg*hr/L  Day of Therapy: 1  Additional Antimicrobials: Pip/Tazo    Pertinent Laboratory Values:   Wt Readings from Last 1 Encounters:   05/30/21 89 lb 9.6 oz (40.6 kg)     Temp Readings from Last 1 Encounters:   05/30/21 98.3 ??F (36.8 ??C) (Oral)     Recent Labs     05/29/21  1643 05/29/21  1945 05/30/21  0157 05/30/21  0333 05/30/21  0335   BUN  --  33*  --   --  31*   CREATININE  --  0.80  --   --  0.70*   WBC 16.7*  --   --  15.2*  --    PROCAL  --  31.02*  --   --   --    LACACIDPL 2.1*  --  0.6  --  0.7     Estimated Creatinine Clearance: 57 mL/min (A) (based on SCr of 0.7 mg/dL (L)).    No results found for: Pandora Leiter    MRSA Nasal Swab: not ordered. Order placed by pharmacy..      Assessment:  Date/Time Dose Concentration AUC         Note: Serum concentrations collected for AUC dosing may appear elevated if collected in close proximity to the dose administered, this is not necessarily an indication of toxicity    Plan:  Will continue the current vancomycin dose of 1000 mg every 18 hours for now  Repeat vancomycin concentration ordered for 6/23 @ 0600    Pharmacy will continue to monitor patient and adjust therapy as indicated    Thank you for the consult,  Mart Piggs, [facility]

## 2021-05-31 ENCOUNTER — Inpatient Hospital Stay: Payer: MEDICARE | Primary: Family Medicine

## 2021-05-31 ENCOUNTER — Inpatient Hospital Stay: Admit: 2021-05-31 | Payer: MEDICARE | Primary: Family Medicine

## 2021-05-31 LAB — PLEASE READ & DOCUMENT PPD TEST IN 24 HRS
PPD, (POC): NEGATIVE
mm Induration: 0 mm (ref 0–5)

## 2021-05-31 LAB — VANCOMYCIN LEVEL, RANDOM: Vancomycin Rm: 19.8 UG/ML

## 2021-05-31 MED ORDER — VIAL2BAG ADAPTOR (20 MM)
1 g | INTRAVENOUS | Status: DC
Start: 2021-05-31 — End: 2021-06-04
  Administered 2021-06-01 – 2021-06-04 (×4): 1000 mg via INTRAVENOUS

## 2021-05-31 MED ORDER — LORAZEPAM 2 MG/ML IJ SOLN
2 MG/ML | Freq: Once | INTRAMUSCULAR | Status: AC
Start: 2021-05-31 — End: 2021-05-30
  Administered 2021-05-31: 04:00:00 0.5 mg via INTRAVENOUS

## 2021-05-31 MED ORDER — BARIUM SULFATE 98 % PO SUSR
98 % | Freq: Once | ORAL | Status: AC | PRN
Start: 2021-05-31 — End: 2021-05-31
  Administered 2021-05-31: 14:00:00 15 mL via ORAL

## 2021-05-31 MED ORDER — SODIUM CHLORIDE 0.9 % IV SOLN
0.9 % | Freq: Once | INTRAVENOUS | Status: AC
Start: 2021-05-31 — End: 2021-05-31
  Administered 2021-05-31: 16:00:00 125 mg via INTRAVENOUS

## 2021-05-31 MED ORDER — NOZIN NASAL SANITIZER 62 % NA KIT
62 % | Freq: Two times a day (BID) | NASAL | Status: DC
Start: 2021-05-31 — End: 2021-06-04
  Administered 2021-05-31 – 2021-06-04 (×9): 1 via TOPICAL

## 2021-05-31 MED FILL — CHLORHEXIDINE GLUCONATE 0.12 % MT SOLN: 0.12 % | OROMUCOSAL | Qty: 15

## 2021-05-31 MED FILL — ENOXAPARIN SODIUM 30 MG/0.3ML IJ SOSY: 30 MG/0.3ML | INTRAMUSCULAR | Qty: 0.3

## 2021-05-31 MED FILL — IPRATROPIUM-ALBUTEROL 0.5-2.5 (3) MG/3ML IN SOLN: 0.5-2.5 (3) MG/3ML | RESPIRATORY_TRACT | Qty: 3

## 2021-05-31 MED FILL — NOZIN NASAL SANITIZER 62 % NA KIT: 62 % | NASAL | Qty: 1

## 2021-05-31 MED FILL — LIDOCAINE VISCOUS HCL 2 % MT SOLN: 2 % | OROMUCOSAL | Qty: 15

## 2021-05-31 MED FILL — PIPERACILLIN SOD-TAZOBACTAM SO 3.375 (3-0.375) G IV SOLR: 3.375 (3-0.375) g | INTRAVENOUS | Qty: 3375

## 2021-05-31 MED FILL — GABAPENTIN 100 MG PO CAPS: 100 mg | ORAL | Qty: 2

## 2021-05-31 MED FILL — NA FERRIC GLUC CPLX IN SUCROSE 12.5 MG/ML IV SOLN: 12.5 mg/mL | INTRAVENOUS | Qty: 10

## 2021-05-31 MED FILL — VITAMIN D3 25 MCG (1000 UT) PO TABS: 25 MCG (1000 UT) | ORAL | Qty: 1

## 2021-05-31 MED FILL — ATIVAN 2 MG/ML IJ SOLN: 2 mg/mL | INTRAMUSCULAR | Qty: 1

## 2021-05-31 MED FILL — NICOTINE STEP 1 21 MG/24HR TD PT24: 21 mg/(24.h) | TRANSDERMAL | Qty: 1

## 2021-05-31 MED FILL — VANCOMYCIN HCL 1 G IV SOLR: 1 g | INTRAVENOUS | Qty: 1000

## 2021-05-31 MED FILL — LORAZEPAM 0.5 MG PO TABS: 0.5 mg | ORAL | Qty: 1

## 2021-05-31 NOTE — Progress Notes (Signed)
VANCO DAILY FOLLOW UP NOTE  [place] Pharmacokinetic Monitoring Service - Vancomycin    Consulting Provider: Dr. Doren Custard   Indication: HAP  Target Concentration: Goal AUC/MIC 400-600 mg*hr/L  Day of Therapy: 2  Additional Antimicrobials: Pip/Tazo    Pertinent Laboratory Values:   Wt Readings from Last 1 Encounters:   05/30/21 88 lb (39.9 kg)     Temp Readings from Last 1 Encounters:   05/31/21 98.4 ??F (36.9 ??C) (Oral)     Recent Labs     05/29/21  1643 05/29/21  1945 05/30/21  0157 05/30/21  0333 05/30/21  0335   BUN  --  33*  --   --  31*   CREATININE  --  0.80  --   --  0.70*   WBC 16.7*  --   --  15.2*  --    PROCAL  --  31.02*  --   --   --    LACACIDPL 2.1*  --  0.6  --  0.7     Estimated Creatinine Clearance: 56 mL/min (A) (based on SCr of 0.7 mg/dL (L)).    Lab Results   Component Value Date    VANCORANDOM 19.8 05/31/2021       MRSA Nasal Swab: not ordered. Order placed by pharmacy..      Assessment:  Date/Time Dose Concentration AUC   6/23 1000mg  q 18 hours 19.8 648   Note: Serum concentrations collected for AUC dosing may appear elevated if collected in close proximity to the dose administered, this is not necessarily an indication of toxicity    Plan:  Current dosing regimen is supra-therapeutic  Decrease dose to 1000mg  q 24 hours for predicted AUC/Tr of 491/11.7  Repeat vancomycin concentration ordered for 6/24 @ 0400    Pharmacy will continue to monitor patient and adjust therapy as indicated    Thank you for the consult,  Hedwig Morton. [place]on Gammon, PharmD, BCPS  Clinical Pharmacist

## 2021-05-31 NOTE — Progress Notes (Signed)
GOALS:   LTG: Patient will tolerate least restrictive oral diet without overt s/sx of airway compromise.   STG: Patient will consume thin liquids for pleasure without overt s/sx of airway compromise. Met 05/31/21  STG: Patient will participate in modified barium swallow study to objectively assess swallow function as medically indicated. Met 05/31/21  STG: Patient will return demonstration in use and care of syringe. Added 05/31/21    SPEECH LANGUAGE PATHOLOGY: MODIFIED BARIUM SWALLOW STUDY Initial Assessment    NAME: Caleb Robinson  DOB: 1951-12-09  MRN: [id number]    ADMISSION DATE: 05/29/2021  ADMITTING DIAGNOSIS: has Emphysema lung ([place]); Dehydration; Vision loss; Tongue cancer ([place]); History of tobacco abuse; Centrilobular emphysema ([place]); Unstable angina ([place]); Myxomatous mitral valve; Acute respiratory failure with hypoxia ([place]); Hyperlipidemia; Anxiety and depression; COPD with acute lower respiratory infection ([place]); Aspiration pneumonia ([place]); Severe protein-calorie malnutrition ([place]); Acute respiratory failure with hypoxemia ([place]); and Anemia on their problem list.  Date of Eval: 05/31/2021    ICD-10: Treatment Diagnosis: R13.11 Dysphagia, Oral Phase  Radiologist: Dr. Hollace Kinnier  RECOMMENDATIONS   Diet:     Liquid consistency: Thin  For pleasure              Liquid administration via: Other (comment) (syringe)    Medications: Via NG/PEG     Additional Recommendations:  ??? Compensatory Swallowing Strategies :  (Syringe for liquid intake)   Interventions/Recommendations/Referrals  ??? Therapeutic Interventions: Patient/Family education  ??? Recommendations: Lorenza Chick Protocol   Patient continues to require skilled intervention: Yes  ??? 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 Thin liquids trials were presented by syringe with posterior placement to bypass  oral phase of swallow. Swallows triggered at pyriform sinuses with no laryngeal penetration or aspiration observed. Minimal residue in pyriform sinuses clears with spontaneous double swallow. No further consistencies attempted.    GENERAL    General  Chart Reviewed: Yes        Behavior/Cognition: Alert,Cooperative,Pleasant mood       Pain:   Patient does not c/o pain                                        Dysphagia History                  Diet Solids Recommendation: NPO  and Liquid Consistency Recommendation: NPO (Sips of thin liquids for pleasure with use of syringe)               History of Present Injury/Illness: Caleb Robinson  has a past medical history of Chronic obstructive pulmonary disease ([place]), Emphysema lung ([place]), HTN (hypertension), and Tongue cancer ([place]).. He also  has a past surgical history that includes hernia repair (05/2019) and bronchoscopy (N/A, 05/30/2021).  OBJECTIVE    Oral Hygiene: Moist,Clean     Dentition: Poor           Labial: Decreased seal,Impaired coordination  Lingual: Partial glossectomy        Baseline Vocal Quality: Dysphonic  Swallowing Study Trial  Type of Study: Modified barium swallow  Film Views: C-Arm,Lateral  Radiologist: Dr. Hollace Kinnier  Patient Position: Sligh[place]ty reclined in stretcher - 75 degrees  Consistencies Administered: Thin  How Presented: Other (comment) (syringe)  Bolus Acceptance: Impaired  Bolus  Formation/Control: Impaired  Type of Impairment: Anterior,Spillage  Propulsion: absent  Oral Residue: Anterior  Initiation of Swallow: Triggered at pyriform sinus(es)  Timing: Pooling 6-10 sec,Pyriform sinus  Penetration: None  Aspiration/Timing: No evidence of aspiration  Pharyngeal Clearance: Pyriform residue,Less than 10%  Attempted Modifications: Double swallow  Attempted Modifications: Double swallow             Swallowing Physiology  Decreased Tongue Base Retraction?: Yes  Laryngeal Elevation: WFL (within functional limits)  Penetration-Aspiration Scale (PAS): 1 -  Material does not enter the airway  Pharyngeal-Esophageal Segment: No impairment  Pharyngeal Dysfunction: Decreased tongue base retraction    Findings  Oral Phase Severity: Moderate-severe  Pharyngeal Phase Severity: Minimal       Upper Esophageal Screen: WFL          *Distal esophagus not assessed due to limitations of modified barium swallow study.      PLAN    Duration/Frequency: Continue to follow patient 1x to train in use and care of syringe.    Dysphagia Outcome and Severity Scale (DOSS)  Dysphagia Outcome Severity Scale: Level 2: Moderate Severe dysphagia- Maximum assistance or maximum use of strategies with partial PO only  Interpretation of Tool: The Dysphagia Outcome and Severity Scale (DOSS) is a simple, easy-to-use, 7-point scale developed to systematically rate the functional severity of dysphagia based on objective assessment and make recommendations for diet level, independence level, and type of nutrition.   Normal(7), Functional(6), Mild(5), Mild-Moderate(4), Moderate(3), Moderate-Severe(2), Severe(1)    Speech Therapy Prognosis  Prognosis: Good  Prognosis Considerations: Co-Morbidities,Medical Diagnosis,Medical Prognosis,Previous Level of Function  Education:  Patient Education: Patient educated in results/recommendations  Patient Education Response: Verbalizes understanding    Current Medications:   No current facility-administered medications on file prior to encounter.     Current Outpatient Medications on File Prior to Encounter   Medication Sig Dispense Refill   ??? sertraline (ZOLOFT) 20 MG/ML concentrated solution Take 2.5 mLs by mouth daily 2.5 mL DAILY (route: oral) 75 mL 5   ??? ibuprofen (ADVIL;MOTRIN) 800 MG tablet Take 1 tablet by mouth twice daily as needed for pain 60 tablet 0   ??? oxyCODONE HCl (OXY-IR) 10 MG immediate release tablet Take 1 tablet by mouth every 6 hours as needed for Pain for up to 30 days. 120 tablet 0   ??? albuterol sulfate HFA 108 (90 Base) MCG/ACT inhaler Inhale 2  puffs into the lungs every 6 hours as needed     ??? budesonide (PULMICORT) 0.5 MG/2ML nebulizer suspension Inhale 500 mcg into the lungs 2 times daily     ??? chlorhexidine (PERIDEX) 0.12 % solution 10 mL 4 TIMES DAILY (route: mucous membrane)     ??? vitamin D 25 MCG (1000 UT) CAPS Take by mouth daily     ??? diphenoxylate-atropine (LOMOTIL) 2.5-0.025 MG per tablet Take 1 tablet by mouth 4 times daily as needed.     ??? fluticasone (FLONASE) 50 MCG/ACT nasal spray 2 sprays by Nasal route daily     ??? fluticasone-umeclidin-vilant (TRELEGY ELLIPTA) 100-62.5-25 MCG/INH AEPB 2 inhalation DAILY (route: inhalation)     ??? gabapentin (NEURONTIN) 100 MG capsule Take 200 mg by mouth 3 times daily as needed.     ??? ipratropium-albuterol (DUONEB) 0.5-2.5 (3) MG/3ML SOLN nebulizer solution Inhale 3 mLs into the lungs every 6 hours as needed     ??? LORazepam (ATIVAN) 0.5 MG tablet Take 0.5 mg by mouth daily.     ??? traZODone (DESYREL) 50 MG tablet Take  50 mg by mouth (Patient not taking: Reported on 05/30/2021)         PRECAUTIONS/ALLERGIES: Patient has no known allergies.     SAFETY: Safety Devices in place: Yes   Results/recommendations called to floor and discussed with RN.      Therapy Time  SLP Individual Minutes  Time In: 8937  Time Out: 3428  Minutes: [address] [place]  05/31/2021 10:50 AM

## 2021-05-31 NOTE — Progress Notes (Signed)
END OF SHIFT NOTE:    INTAKE/OUTPUT  No intake/output data recorded.  Voiding: Yes  Catheter: No  Drain:   Gastrostomy/Enterostomy/Jejunostomy Tube Percutaneous Endoscopic Gastrostomy (PEG) LLQ (Active)   Drainage Appearance None 05/31/21 0400   Site Description Clean, dry & intact 05/31/21 0400   J Port Status Clamped 05/31/21 0400   G Port Status Clamped 05/31/21 0400   Surrounding Skin Clean, dry & intact 05/31/21 0400   Dressing Status Clean, dry & intact 05/31/21 0400   Dressing Type Dry dressing 05/31/21 0400   G-Tube Care Completed Yes 05/30/21 1950               Flatus: Patient does not have flatus present.    Stool:  0 occurrences.    Characteristics:       Emesis: 0 occurrences.    Characteristics:        VITAL SIGNS  Patient Vitals for the past 12 hrs:   Temp Pulse Resp BP SpO2   05/31/21 0610 -- -- -- -- 96 %   05/31/21 0412 97.5 ??F (36.4 ??C) 66 18 (!) 114/55 97 %   05/31/21 0034 98.1 ??F (36.7 ??C) 73 18 (!) 115/53 95 %   05/31/21 0009 -- -- -- -- 97 %   05/30/21 1932 97.5 ??F (36.4 ??C) 76 19 120/69 96 %   05/30/21 1912 -- -- -- -- 97 %       Pain Assessment  @BSHSIFLOW (1171)@             Ambulating  Yes    Shift report given to oncoming nurse at the bedside.    Leslee Home, RN

## 2021-05-31 NOTE — Plan of Care (Signed)
Problem: Discharge Planning  Goal: Discharge to home or other facility with appropriate resources  Outcome: Progressing     Problem: Skin/Tissue Integrity  Goal: Absence of new skin breakdown  Description: 1.  Monitor for areas of redness and/or skin breakdown  2.  Assess vascular access sites hourly  3.  Every 4-6 hours minimum:  Change oxygen saturation probe site  4.  Every 4-6 hours:  If on nasal continuous positive airway pressure, respiratory therapy assess nares and determine need for appliance change or resting period.  Outcome: Progressing     Problem: Safety - Adult  Goal: Free from fall injury  Outcome: Progressing  Flowsheets  Taken 05/31/2021 0929 by Stacie Glaze, RN  Free From Fall Injury: Instruct family/caregiver on patient safety  Taken 05/31/2021 0928 by Stacie Glaze, RN  Free From Fall Injury: Instruct family/caregiver on patient safety     Problem: ABCDS Injury Assessment  Goal: Absence of physical injury  Outcome: Progressing  Flowsheets (Taken 05/31/2021 0929 by Stacie Glaze, RN)  Absence of Physical Injury: Implement safety measures based on patient assessment     Problem: Pain  Goal: Verbalizes/displays adequate comfort level or baseline comfort level  Outcome: Progressing  Flowsheets (Taken 05/31/2021 1456 by Stacie Glaze, RN)  Verbalizes/displays adequate comfort level or baseline comfort level: Encourage patient to monitor pain and request assistance     Problem: Respiratory - Adult  Goal: Achieves optimal ventilation and oxygenation  Outcome: Progressing

## 2021-05-31 NOTE — Progress Notes (Addendum)
Caleb Robinson  Admission Date: 05/29/2021         Daily Progress Note: 05/31/2021    The patient's chart is reviewed and the patient is discussed with the staff.    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 Pt was recently hospitalized 5/6-04/16/21 for acute on chronic respiratory failure and aspiration pneumonia. He was seen by his PCP on 6/21 and found to have a fever and diarrhea. Pt was admitted by the hospitalist service and we were consulted for therapeutic bronch. Pt underwent bronch 6/22 without any issues. Sputum cx is pending.     Subjective:     Pt is sitting up in bed on 2L O2. Pt reports that his cough is a little better since bronch yesterday.   He does feel a little short of breath at times.   His ex-wife is at bedside and reports that he was supposed to have chest CT today as ordered by our office but she called and canceled it this AM.     Current Facility-Administered Medications   Medication Dose Route Frequency   ??? vancomycin (VANCOCIN) 1,000 mg in sodium chloride 0.9 % 250 mL IVPB (Vial2Bag)  1,000 mg IntraVENous Q24H   ??? ferric gluconate (FERRLECIT) 125 mg in sodium chloride 0.9 % 100 mL IVPB  125 mg IntraVENous Once   ??? alcohol 62% (NOZIN) nasal sanitizer 1 ampule  1 ampule Topical Q12H   ??? albuterol sulfate HFA (PROVENTIL;VENTOLIN;PROAIR) 108 (90 Base) MCG/ACT inhaler 2 puff  2 puff Inhalation Q6H PRN   ??? chlorhexidine (PERIDEX) 0.12 % solution 15 mL  15 mL Mouth/Throat 4x Daily   ??? diphenoxylate-atropine (LOMOTIL) 2.5-0.025 MG per tablet 1 tablet  1 tablet Oral 4x Daily PRN   ??? fluticasone (FLONASE) 50 MCG/ACT nasal spray 2 spray  2 spray Nasal Daily   ??? fluticasone-umeclidin-vilant (TRELEGY ELLIPTA) 100-62.5-25 MCG/INH inhaler 1 puff (Patient Supplied)  1 puff Inhalation Daily   ???  gabapentin (NEURONTIN) capsule 200 mg  200 mg Oral TID   ??? ipratropium-albuterol (DUONEB) nebulizer solution 3 mL  3 mL Inhalation 6 times per day   ??? LORazepam (ATIVAN) tablet 0.5 mg  0.5 mg Oral Daily   ??? oxyCODONE (ROXICODONE) immediate release tablet 10 mg  10 mg Oral Q6H PRN   ??? sertraline (ZOLOFT) 20 MG/ML concentrated solution 50 mg (Patient Supplied)  50 mg Oral Daily   ??? traZODone (DESYREL) tablet 50 mg  50 mg Oral Nightly PRN   ??? Vitamin D (CHOLECALCIFEROL) tablet 1,000 Units  1,000 Int'l Units Oral Daily   ??? sodium chloride flush 0.9 % injection 5-40 mL  5-40 mL IntraVENous 2 times per day   ??? sodium chloride flush 0.9 % injection 5-40 mL  5-40 mL IntraVENous PRN   ??? 0.9 % sodium chloride infusion   IntraVENous PRN   ??? enoxaparin Sodium (LOVENOX) injection 30 mg  30 mg SubCUTAneous Q24H   ??? ondansetron (ZOFRAN-ODT) disintegrating tablet 4 mg  4 mg Oral Q8H PRN    Or   ??? ondansetron (ZOFRAN) injection 4 mg  4 mg IntraVENous Q6H PRN   ??? polyethylene glycol (GLYCOLAX) packet 17 g  17 g Oral Daily PRN   ??? acetaminophen (TYLENOL) tablet 650 mg  650 mg Oral Q6H PRN    Or   ??? acetaminophen (  TYLENOL) suppository 650 mg  650 mg Rectal Q6H PRN   ??? piperacillin-tazobactam (ZOSYN) 3,375 mg in sodium chloride 0.9 % 50 mL IVPB (mini-bag)  3,375 mg IntraVENous q8h   ??? nicotine (NICODERM CQ) 21 MG/24HR 1 patch  1 patch TransDERmal Daily   ??? tuberculin injection 5 Units  5 Units IntraDERmal Once   ??? magnesium sulfate 2000 mg in 50 mL IVPB premix  2,000 mg IntraVENous PRN   ??? lidocaine viscous hcl (XYLOCAINE) 2 % solution 15 mL  15 mL Mouth/Throat Q3H PRN     Review of Systems  +cough  +shortness of breath  Constitutional: negative for fever, chills, sweats  Cardiovascular: negative for chest pain, palpitations, syncope, edema  Gastrointestinal:  negative for dysphagia, reflux, vomiting, diarrhea, abdominal pain, or melena  Neurologic:  negative for focal weakness, numbness, headache  Objective:   Blood pressure (!)  105/52, pulse 78, temperature 98.4 ??F (36.9 ??C), temperature source Oral, resp. rate 18, height 5\' 4"  (1.626 m), weight 88 lb (39.9 kg), SpO2 97 %.     Intake/Output Summary (Last 24 hours) at 05/31/2021 5329  Last data filed at 05/30/2021 1822  Gross per 24 hour   Intake 0 ml   Output --   Net 0 ml     Physical Exam:   Constitution:  the patient is cachectic and in no acute distress, on 2L O2   HEENT:  Sclera clear, pupils equal, oral mucosa moist dry  Respiratory: few rhonchi  Cardiovascular:  RRR without M,G,R  Gastrointestinal: soft and non-tender; with positive bowel sounds.  Musculoskeletal: warm without cyanosis. There is no lower extremity edema.  Skin:  no jaundice or rashes   Neurologic: no gross neuro deficits     Psychiatric:  alert and oriented x 3    CXR: 05/30/21      04/14/21      LAB:  Recent Labs     05/29/21  1643 05/29/21  1945 05/30/21  0333   WBC 16.7*  --  15.2*   HGB 9.8*  --  9.1*   HCT 31.1*  --  28.8*   PLT 231  --  249   PROCAL  --  31.02*  --      Recent Labs     05/29/21  1945 05/30/21  0335   NA 141 144   K 4.4 4.1   CL 104 108*   CO2 31 31   BUN 33* 31*   CREATININE 0.80 0.70*   BILITOT 0.4 0.3   0.3   AST 41* 34   34   ALT 25 22   22    ALKPHOS 115 83   83     Recent Labs     05/30/21  0335 05/30/21  0821   TROPHS 14.2* 11.0     Recent Labs     05/29/21  1945 05/30/21  0335   GLUCOSE 89 73      Microbiology:   Recent Labs     05/29/21  1945 05/30/21  0332 05/30/21  0424   CULTURE NO GROWTH 2 DAYS   NO GROWTH 2 DAYS NO GROWTH 1 DAY NO GROWTH 1 DAY     ECHO: No results found for this or any previous visit.    Assessment and Plan:  (Medical Decision Making)   Principal Problem:    Acute respiratory failure with hypoxemia ([place])  Plan: wean O2 as tolerated   Active Problems:    Anemia  Plan: monitor  Emphysema lung ([place])  Plan: continue nebs    Tongue cancer ([place])  Plan: s/p tongue resection    Hyperlipidemia  Plan: continue home meds    Anxiety and depression  Plan: continue home meds      Aspiration pneumonia ([place])  Plan: on vanc/zosyn day 2    Severe protein-calorie malnutrition ([place])  Plan: PEG tube in place, GI to replace in a few weeks, continue TFs      More than 50% of the time documented was spent in face-to-face contact with the patient and in the care of the patient on the floor/unit where the patient is located.    In this split/shared evaluation I performed counseled and educated the patient and/or family member, ordered medications, tests or procedures, documented information in EMR and coordinated care. which accounted for 11 minutes of clinical time.     Rayna Sexton, PA   I have spoken with and examined the patient. I agree with the above assessment and plan as documented. I contributed more than 50% of the clinical time to this encounter today.    MHD:[id number]  Lungs:  Clear,decreased breath sounds  Heart:  RRR with no Murmur/Rubs/Gallops  NLG:XQJJ  Ext: no edema-muscle atrophy    S/p bronch yesterday- cultures  Heavy staph aureus on vanc and zosyn-check mrsa screen    Shella Spearing, MD

## 2021-05-31 NOTE — Progress Notes (Addendum)
Hospitalist Progress Note   Admit Date:  05/29/2021  6:36 PM   Name:  Caleb Robinson   Age:  70 y.o.  Sex:  male  DOB:  04-28-1951   MRN:  [id number]   Room:  212/01    Presenting Complaint: Fatigue     Reason(s) for Admission: Hypoxia [R09.02]  Acute respiratory failure with hypoxemia ([place]) [J96.01]  Pneumonia of both lower lobes due to infectious organism [J18.9]     Hospital Course & Interval History:   Please refer to the admission H&P for details of presentation.      In summary, Caleb Robinson is a 70 y.o. 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  Meets criteria for sepsis with leukocytosis and tachypnea.  Noted to be hypoxic at 88% on room air requiring 2 L of O2 nasal cannula.  Chest x-ray shows increased bibasilar infiltrate concerning for aspiration pneumonia.    Subjective/24 hr Events (05/31/21) :  Patient is seen and examined at bedside.  No acute events reported overnight by nursing staff.  Sitting up in bed. Recently had MBBS test done by SLP.  Reports cough which has improved. SOB at times.   Currently on 2 L O2 nasal cannula.  Patient denies fever, chills, chest pains, shortness of breath, n/v, abdominal pain.    Review of Systems: 10 point review of systems is otherwise negative with the exception of the elements mentioned above.        Assessment & Plan:     Acute respiratory failure with hypoxemia concerning for Aspiration pneumonia  Emphysema  Patient was noted to be hypoxic with saturation of 88% on room air in the emergency room with respiratory distress.  05/31/21: Continues to require O2 supplement which is not his baseline. S/p Bronch yesterday.  BAL with staph aureus, BCX (6/21 and 6/22) neg to date.  Respiratory viral panel neg.  - pulmonary consulted. Bronch later today  - zosyn for aspiration pneumonia  - Continue with vancomycin in light of his BAL +ve for staph aureus  - O2 supplement and wean as tolerated  -Speech consulted.  Plan for modified barium swallow tomorrow    Anemia  Likely of chronic disease given underlying malignancy and poor nutrition.  Hb of 9.8 on admission.  Dropped to 9.1 today.  05/31/21: Iron of 12 with ferritin of 101.  We will give 1 dose of IV iron.  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 Planned for outpatient exchange in 2 weeks.    Anxiety and depression: Resume home Zoloft    Diet:  Diet NPO  ADULT TUBE FEEDING; PEG; Standard with Fiber; Bolus; 4 Times Daily; 240; Syringe Push; 100; Before and after each bolus  DVT PPx: lovenox  Code status: Full Code        I have reviewed and ordered clinical lab tests and independently visualized images, tracing.   I spent 32 minutes of time caring for this patient at bedside or nearby, more than 50 percent of which was spent  on coordination of care and/or counseling regarding the disease process, treatment options, and treatment plan.        Hospital Problems:  Principal Problem:    Acute respiratory failure with hypoxemia (HCC)  Active Problems:    Anemia    Pneumonia of both lower lobes due to infectious organism    Emphysema lung (HCC)    Tongue cancer (HCC)    Hyperlipidemia    Anxiety and depression    Aspiration pneumonia (HCC)    Severe protein-calorie malnutrition ([place])  Resolved Problems:    * No resolved hospital problems. *      Objective:     Patient Vitals for the past 24 hrs:   Temp Pulse Resp BP SpO2   05/31/21 1205 -- -- -- -- 97 %   05/31/21 1058 98 ??F (36.7 ??C) 78 17 (!) 112/57 98 %   05/31/21 0658 98.4 ??F (36.9 ??C) 78 18 (!) 105/52 97 %   05/31/21  0610 -- -- -- -- 96 %   05/31/21 0412 97.5 ??F (36.4 ??C) 66 18 (!) 114/55 97 %   05/31/21 0034 98.1 ??F (36.7 ??C) 73 18 (!) 115/53 95 %   05/31/21 0009 -- -- -- -- 97 %   05/30/21 1932 97.5 ??F (36.4 ??C) 76 19 120/69 96 %   05/30/21 1912 -- -- -- -- 97 %   05/30/21 1715 -- -- -- (!) 101/52 --   05/30/21 1544 97.3 ??F (36.3 ??C) 77 18 (!) 88/57 97 %   05/30/21 1530 -- 60 18 (!) 100/56 100 %   05/30/21 1507 -- 59 18 (!) 102/54 98 %   05/30/21 1459 -- 63 18 (!) 95/52 98 %   05/30/21 1450 -- 62 18 (!) 100/52 100 %   05/30/21 1440 -- 64 18 (!) 88/50 99 %   05/30/21 1430 -- 68 18 (!) 85/46 99 %   05/30/21 1420 -- 68 18 (!) 86/43 97 %   05/30/21 1410 -- 77 18 (!) 105/51 99 %   05/30/21 1359 -- -- -- (!) 116/58 97 %   05/30/21 1350 -- 84 29 128/61 95 %   05/30/21 1349 -- 82 13 (!) 101/57 92 %   05/30/21 1348 -- 85 28 (!) 102/58 99 %   05/30/21 1344 -- 62 25 (!) 103/55 98 %   05/30/21 1339 -- 62 (!) 35 (!) 98/51 98 %       Oxygen Therapy  SpO2: 97 %  Pulse via Oximetry: 62 beats per minute  Pulse Oximeter Device Mode: Continuous  O2 Device: Nasal cannula  O2 Flow Rate (L/min): 2 L/min    Estimated body mass index is 15.11 kg/m?? as calculated from the following:    Height as of this encounter: _0  (1.626 m).    Weight as of this encounter: 88 lb (39.9 kg).    Intake/Output Summary (Last 24 hours) at 05/31/2021 1333  Last data filed at 05/30/2021 1822  Gross per 24 hour   Intake 0 ml   Output --   Net 0 ml         Physical Exam:     Blood pressure (!) 112/57, pulse 78, temperature 98 ??F (36.7 ??C), temperature source Oral, resp. rate 17, height _1  (1.626 m), weight 88 lb (39.9 kg), SpO2 97 %.     General:    Cachetic, chronically ill appearing     Head:  Normocephalic, atraumatic  Eyes:  Sclerae appear normal.  Pupils  equally round.  ENT:  Nares appear normal, no drainage.  Moist oral mucosa  Neck:  No restricted ROM.  Trachea midline   CV:   RRR.  No m/r/g.  No jugular venous distension.  Lungs:   Coarse breath sounds, no  wheezing  Abdomen:   Bowel sounds present.  Soft, nontender, nondistended. +PEG  Extremities: No cyanosis or clubbing.  No edema  Skin:     No rashes and normal coloration.   Warm and dry.    Neuro:  CN II-XII grossly intact.  A&Ox3  Psych:  Normal mood and affect.      I have reviewed ordered lab tests and independently visualized imaging below:    Recent Labs:  Recent Results (from the past 48 hour(s))   CBC with Auto Differential    Collection Time: 05/29/21  4:43 PM   Result Value Ref Range    WBC 16.7 (H) 4.3 - 11.1 K/uL    RBC 3.16 (L) 4.23 - 5.6 M/uL    Hemoglobin 9.8 (L) 13.6 - 17.2 g/dL    Hematocrit 31.1 (L) 41.1 - 50.3 %    MCV 98.4 (H) 79.6 - 97.8 FL    MCH 31.0 26.1 - 32.9 PG    MCHC 31.5 31.4 - 35.0 g/dL    RDW 16.5 (H) 11.9 - 14.6 %    Platelets 231 150 - 450 K/uL    MPV 10.8 9.4 - 12.3 FL    nRBC 0.00 0.0 - 0.2 K/uL    Seg Neutrophils 90 (H) 43 - 78 %    Lymphocytes 3 (L) 13 - 44 %    Monocytes 5 4.0 - 12.0 %    Eosinophils % 0 (L) 0.5 - 7.8 %    Basophils 1 0.0 - 2.0 %    Immature Granulocytes 1 0.0 - 5.0 %    Segs Absolute 15.0 (H) 1.7 - 8.2 K/UL    Absolute Lymph # 0.5 0.5 - 4.6 K/UL    Absolute Mono # 0.8 0.1 - 1.3 K/UL    Absolute Eos # 0.0 0.0 - 0.8 K/UL    Basophils Absolute 0.2 0.0 - 0.2 K/UL    Absolute Immature Granulocyte 0.2 0.0 - 0.5 K/UL    RBC Comment SLIGHT  ANISOCYTOSIS + POIKILOCYTOSIS        WBC Comment Result Confirmed By Smear      Platelet Comment ADEQUATE      Differential Type AUTOMATED     Lactic Acid    Collection Time: 05/29/21  4:43 PM   Result Value Ref Range    Lactic Acid, Plasma 2.1 (H) 0.4 - 2.0 MMOL/L   COVID-19, Rapid    Collection Time: 05/29/21  4:43 PM    Specimen: Nasopharyngeal   Result Value Ref Range    Source NASAL      SARS-CoV-2, Rapid Not detected NOTD     Comprehensive Metabolic Panel    Collection Time: 05/29/21  7:45 PM   Result Value Ref Range    Sodium 141 138 - 145 mmol/L    Potassium 4.4 3.5 - 5.1 mmol/L    Chloride 104 98 - 107 mmol/L    CO2 31 21  - 32 mmol/L    Anion Gap 6 (L) 7 - 16 mmol/L    Glucose 89 65 - 100 mg/dL    BUN 33 (H) 8 - 23 MG/DL    CREATININE 0.80 0.8 - 1.5 MG/DL    GFR African American >60 >60 ml/min/1.50m  GFR Non-African American >60 >60 ml/min/1.15m    Calcium 9.2 8.3 - 10.4 MG/DL    Total Bilirubin 0.4 0.2 - 1.1 MG/DL    ALT 25 12 - 65 U/L    AST 41 (H) 15 - 37 U/L    Alk Phosphatase 115 50 - 136 U/L    Total Protein 6.4 6.3 - 8.2 g/dL    Albumin 2.6 (L) 3.2 - 4.6 g/dL    Globulin 3.8 (H) 2.3 - 3.5 g/dL    Albumin/Globulin Ratio 0.7 (L) 1.2 - 3.5     Culture, Blood 1    Collection Time: 05/29/21  7:45 PM    Specimen: Blood   Result Value Ref Range    Special Requests RIGHT  BLUE PICC        Culture NO GROWTH 2 DAYS     Culture, Blood 1    Collection Time: 05/29/21  7:45 PM    Specimen: Blood   Result Value Ref Range    Special Requests LEFT  FOREARM        Culture NO GROWTH 2 DAYS     Procalcitonin    Collection Time: 05/29/21  7:45 PM   Result Value Ref Range    Procalcitonin 31.02 (H) 0.00 - 0.49 ng/mL   Respiratory Panel, Molecular, with COVID-19 (Restricted: peds pts or suitable admitted adults)    Collection Time: 05/29/21 10:47 PM    Specimen: Nasopharyngeal   Result Value Ref Range    Adenovirus by PCR NOT DETECTED NOTDET      Coronavirus 229E by PCR NOT DETECTED NOTDET      Coronavirus HKU1 by PCR NOT DETECTED NOTDET      Coronavirus NL63 by PCR NOT DETECTED NOTDET      Coronavirus OC43 by PCR NOT DETECTED NOTDET      SARS-CoV-2, PCR NOT DETECTED NOTDET      Human Metapneumovirus by PCR NOT DETECTED NOTDET      Rhinovirus Enterovirus PCR NOT DETECTED NOTDET      Influenza A by PCR NOT DETECTED NOTDET      INFLUENZA B PCR NOT DETECTED NOTDET      Parainfluenza 1 PCR NOT DETECTED NOTDET      Parainfluenza 2 PCR NOT DETECTED NOTDET      Parainfluenza 3 PCR NOT DETECTED NOTDET      Parainfluenza 4 PCR NOT DETECTED NOTDET      Respiratory Syncytial Virus by PCR NOT DETECTED NOTDET      Bordetella parapertussis by PCR NOT DETECTED  NOTDET      Bordetella pertussis by PCR NOT DETECTED NOTDET      Chlamydophila Pneumonia PCR NOT DETECTED NOTDET      Mycoplasma pneumo by PCR NOT DETECTED NOTDET     Lactic Acid    Collection Time: 05/30/21  1:57 AM   Result Value Ref Range    Lactic Acid, Plasma 0.6 0.4 - 2.0 MMOL/L   POCT Urinalysis no Micro    Collection Time: 05/30/21  2:01 AM   Result Value Ref Range    Specific Gravity, Urine, POC 1.020 1.001 - 1.023      pH, Urine, POC 6.5 5.0 - 9.0      Protein, Urine, POC Negative NEG mg/dL    Glucose, UA POC Negative NEG mg/dL    KETONES, Urine, POC 15 (A) NEG mg/dL    Bilirubin, Urine, POC Negative NEG      Blood, UA POC Negative NEG      URINE  UROBILINOGEN POC 0.2 0.2 - 1.0 EU/dL    Nitrate, Urine, POC Negative NEG      Leukocyte Est, UA POC Negative NEG      Performed by: Marc Morgans    EKG 12 lead    Collection Time: 05/30/21  3:12 AM   Result Value Ref Range    Ventricular Rate 56 BPM    Atrial Rate 56 BPM    P-R Interval 128 ms    QRS Duration 92 ms    Q-T Interval 464 ms    QTc Calculation (Bazett) 447 ms    P Axis 71 degrees    R Axis 81 degrees    T Axis 63 degrees    Diagnosis Sinus bradycardia    Culture, Blood 1    Collection Time: 05/30/21  3:32 AM    Specimen: Blood   Result Value Ref Range    Special Requests LEFT  Antecubital        Culture NO GROWTH 1 DAY     CBC with Auto Differential    Collection Time: 05/30/21  3:33 AM   Result Value Ref Range    WBC 15.2 (H) 4.3 - 11.1 K/uL    RBC 2.97 (L) 4.23 - 5.6 M/uL    Hemoglobin 9.1 (L) 13.6 - 17.2 g/dL    Hematocrit 28.8 (L) 41.1 - 50.3 %    MCV 97.0 79.6 - 97.8 FL    MCH 30.6 26.1 - 32.9 PG    MCHC 31.6 31.4 - 35.0 g/dL    RDW 16.7 (H) 11.9 - 14.6 %    Platelets 249 150 - 450 K/uL    MPV 11.0 9.4 - 12.3 FL    nRBC 0.00 0.0 - 0.2 K/uL    Differential Type AUTOMATED      Seg Neutrophils 88 (H) 43 - 78 %    Lymphocytes 5 (L) 13 - 44 %    Monocytes 6 4.0 - 12.0 %    Eosinophils % 0 (L) 0.5 - 7.8 %    Basophils 0 0.0 - 2.0 %    Immature  Granulocytes 1 0.0 - 5.0 %    Segs Absolute 13.3 (H) 1.7 - 8.2 K/UL    Absolute Lymph # 0.7 0.5 - 4.6 K/UL    Absolute Mono # 0.9 0.1 - 1.3 K/UL    Absolute Eos # 0.0 0.0 - 0.8 K/UL    Basophils Absolute 0.1 0.0 - 0.2 K/UL    Absolute Immature Granulocyte 0.2 0.0 - 0.5 K/UL   Hepatic function panel    Collection Time: 05/30/21  3:35 AM   Result Value Ref Range    Total Protein 5.4 (L) 6.3 - 8.2 g/dL    Albumin 2.2 (L) 3.2 - 4.6 g/dL    Globulin 3.2 2.3 - 3.5 g/dL    Albumin/Globulin Ratio 0.7 (L) 1.2 - 3.5      Total Bilirubin 0.3 0.2 - 1.1 MG/DL    Bilirubin, Direct 0.1 <0.4 MG/DL    Alk Phosphatase 83 50 - 136 U/L    AST 34 15 - 37 U/L    ALT 22 12 - 65 U/L   Lactic Acid    Collection Time: 05/30/21  3:35 AM   Result Value Ref Range    Lactic Acid, Plasma 0.7 0.4 - 2.0 MMOL/L   Troponin    Collection Time: 05/30/21  3:35 AM   Result Value Ref Range    Troponin, High Sensitivity 14.2 (H) 0 - 14  pg/mL   Comprehensive Metabolic Panel w/ Reflex to MG    Collection Time: 05/30/21  3:35 AM   Result Value Ref Range    Sodium 144 138 - 145 mmol/L    Potassium 4.1 3.5 - 5.1 mmol/L    Chloride 108 (H) 98 - 107 mmol/L    CO2 31 21 - 32 mmol/L    Anion Gap 5 (L) 7 - 16 mmol/L    Glucose 73 65 - 100 mg/dL    BUN 31 (H) 8 - 23 MG/DL    CREATININE 0.70 (L) 0.8 - 1.5 MG/DL    GFR African American >60 >60 ml/min/1.11m    GFR Non-African American >60 >60 ml/min/1.739m   Calcium 8.5 8.3 - 10.4 MG/DL    Total Bilirubin 0.3 0.2 - 1.1 MG/DL    ALT 22 12 - 65 U/L    AST 34 15 - 37 U/L    Alk Phosphatase 83 50 - 136 U/L    Total Protein 5.5 (L) 6.3 - 8.2 g/dL    Albumin 2.2 (L) 3.2 - 4.6 g/dL    Globulin 3.3 2.3 - 3.5 g/dL    Albumin/Globulin Ratio 0.7 (L) 1.2 - 3.5     Transferrin Saturation    Collection Time: 05/30/21  3:36 AM   Result Value Ref Range    Iron 12 (L) 35 - 150 ug/dL    TIBC 227 (L) 250 - 450 ug/dL    TRANSFERRIN SATURATION 5 (L) >20 %   Vitamin B12    Collection Time: 05/30/21  3:36 AM   Result Value Ref Range     Vitamin B-12 1381 (H) 193 - 986 pg/mL   Ferritin    Collection Time: 05/30/21  3:36 AM   Result Value Ref Range    Ferritin 101 8 - 388 NG/ML   Folate    Collection Time: 05/30/21  3:36 AM   Result Value Ref Range    Folate 32.0 (H) 3.1 - 17.5 ng/mL   Culture, Blood 1    Collection Time: 05/30/21  4:24 AM    Specimen: Blood   Result Value Ref Range    Special Requests LEFT  Antecubital        Culture NO GROWTH 1 DAY     Troponin    Collection Time: 05/30/21  8:21 AM   Result Value Ref Range    Troponin, High Sensitivity 11.0 0 - 14 pg/mL   Urinalysis w rflx microscopic    Collection Time: 05/30/21  8:28 AM   Result Value Ref Range    Color, UA YELLOW/STRAW      Appearance CLEAR      Specific Gravity, UA 1.028 (H) 1.001 - 1.023      pH, Urine 6.5 5.0 - 9.0      Protein, UA TRACE (A) NEG mg/dL    Glucose, UA Negative mg/dL    Ketones, Urine 15 (A) NEG mg/dL    Bilirubin Urine Negative NEG      Blood, Urine Negative NEG      Urobilinogen, Urine 1.0 0.2 - 1.0 EU/dL    Nitrite, Urine Negative NEG      Leukocyte Esterase, Urine Negative NEG      WBC, UA 0-4 (A) NORM /hpf    RBC, UA 0-5 (A) NORM /hpf    Epithelial Cells UA 0-5 (A) NORM /hpf    BACTERIA, URINE Negative (A) NORM /hpf    Casts 0-2 (A) NORM /lpf   Culture, Respiratory  Collection Time: 05/30/21  1:50 PM    Specimen: Bronchial Washing   Result Value Ref Range    Special Requests NO SPECIAL REQUESTS      Gram stain 20 TO 40 WBC'S SEEN PER OIF     Gram stain 0 TO 1 EPITHELIAL CELLS SEEN     Gram stain MANY GRAM POSITIVE COCCI      Gram stain FEW YEAST      Gram stain 4+ MUCUS PRESENT      Culture HEAVY STAPHYLOCOCCUS AUREUS SUBCULTURE IN PROGRESS (A)      Culture MODERATE NORMAL RESPIRATORY FLORA     Vancomycin Level, Random    Collection Time: 05/31/21  4:16 AM   Result Value Ref Range    Vancomycin Rm 19.8 UG/ML       Other Studies:  FL MODIFIED BARIUM SWALLOW W VIDEO   Final Result   Normal swallowing evaluation with thin barium liquids administered   via  syringe. For more detailed evaluation of the swallowing study please refer   to the separately dictated speech pathologist report         XR CHEST PORTABLE   Final Result      1. Findings as described above.       621      XR CHEST (2 VW)   Final Result   Increased bibasilar infiltrate             Current Meds:  Current Facility-Administered Medications   Medication Dose Route Frequency   ??? vancomycin (VANCOCIN) 1,000 mg in sodium chloride 0.9 % 250 mL IVPB (Vial2Bag)  1,000 mg IntraVENous Q24H   ??? alcohol 62% (NOZIN) nasal sanitizer 1 ampule  1 ampule Topical Q12H   ??? albuterol sulfate HFA (PROVENTIL;VENTOLIN;PROAIR) 108 (90 Base) MCG/ACT inhaler 2 puff  2 puff Inhalation Q6H PRN   ??? chlorhexidine (PERIDEX) 0.12 % solution 15 mL  15 mL Mouth/Throat 4x Daily   ??? diphenoxylate-atropine (LOMOTIL) 2.5-0.025 MG per tablet 1 tablet  1 tablet Oral 4x Daily PRN   ??? fluticasone (FLONASE) 50 MCG/ACT nasal spray 2 spray  2 spray Nasal Daily   ??? fluticasone-umeclidin-vilant (TRELEGY ELLIPTA) 100-62.5-25 MCG/INH inhaler 1 puff (Patient Supplied)  1 puff Inhalation Daily   ??? gabapentin (NEURONTIN) capsule 200 mg  200 mg Oral TID   ??? ipratropium-albuterol (DUONEB) nebulizer solution 3 mL  3 mL Inhalation 6 times per day   ??? LORazepam (ATIVAN) tablet 0.5 mg  0.5 mg Oral Daily   ??? oxyCODONE (ROXICODONE) immediate release tablet 10 mg  10 mg Oral Q6H PRN   ??? sertraline (ZOLOFT) 20 MG/ML concentrated solution 50 mg (Patient Supplied)  50 mg Oral Daily   ??? traZODone (DESYREL) tablet 50 mg  50 mg Oral Nightly PRN   ??? Vitamin D (CHOLECALCIFEROL) tablet 1,000 Units  1,000 Int'l Units Oral Daily   ??? sodium chloride flush 0.9 % injection 5-40 mL  5-40 mL IntraVENous 2 times per day   ??? sodium chloride flush 0.9 % injection 5-40 mL  5-40 mL IntraVENous PRN   ??? 0.9 % sodium chloride infusion   IntraVENous PRN   ??? enoxaparin Sodium (LOVENOX) injection 30 mg  30 mg SubCUTAneous Q24H   ??? ondansetron (ZOFRAN-ODT) disintegrating tablet 4 mg  4 mg  Oral Q8H PRN    Or   ??? ondansetron (ZOFRAN) injection 4 mg  4 mg IntraVENous Q6H PRN   ??? polyethylene glycol (GLYCOLAX) packet 17 g  17 g Oral Daily PRN   ???  acetaminophen (TYLENOL) tablet 650 mg  650 mg Oral Q6H PRN    Or   ??? acetaminophen (TYLENOL) suppository 650 mg  650 mg Rectal Q6H PRN   ??? piperacillin-tazobactam (ZOSYN) 3,375 mg in sodium chloride 0.9 % 50 mL IVPB (mini-bag)  3,375 mg IntraVENous q8h   ??? nicotine (NICODERM CQ) 21 MG/24HR 1 patch  1 patch TransDERmal Daily   ??? magnesium sulfate 2000 mg in 50 mL IVPB premix  2,000 mg IntraVENous PRN   ??? lidocaine viscous hcl (XYLOCAINE) 2 % solution 15 mL  15 mL Mouth/Throat Q3H PRN       Signed:  Helmut Muster, MD    Part of this note may have been written by using a voice dictation software.  The note has been proof read but may still contain some grammatical/other typographical errors.

## 2021-06-01 ENCOUNTER — Inpatient Hospital Stay: Admit: 2021-06-01 | Payer: MEDICARE | Primary: Family Medicine

## 2021-06-01 LAB — CBC WITH AUTO DIFFERENTIAL
Absolute Eos #: 0.1 10*3/uL (ref 0.0–0.8)
Absolute Immature Granulocyte: 0.1 10*3/uL (ref 0.0–0.5)
Absolute Lymph #: 0.6 10*3/uL (ref 0.5–4.6)
Absolute Mono #: 1 10*3/uL (ref 0.1–1.3)
Basophils Absolute: 0 10*3/uL (ref 0.0–0.2)
Basophils: 1 % (ref 0.0–2.0)
Eosinophils %: 2 % (ref 0.5–7.8)
Hematocrit: 27.1 % — ABNORMAL LOW (ref 41.1–50.3)
Hemoglobin: 8.5 g/dL — ABNORMAL LOW (ref 13.6–17.2)
Immature Granulocytes: 1 % (ref 0.0–5.0)
Lymphocytes: 8 % — ABNORMAL LOW (ref 13–44)
MCH: 30.2 PG (ref 26.1–32.9)
MCHC: 31.4 g/dL (ref 31.4–35.0)
MCV: 96.4 FL (ref 79.6–97.8)
MPV: 10.7 FL (ref 9.4–12.3)
Monocytes: 13 % — ABNORMAL HIGH (ref 4.0–12.0)
Platelets: 244 10*3/uL (ref 150–450)
RBC: 2.81 M/uL — ABNORMAL LOW (ref 4.23–5.6)
RDW: 16.4 % — ABNORMAL HIGH (ref 11.9–14.6)
Seg Neutrophils: 75 % (ref 43–78)
Segs Absolute: 5.8 10*3/uL (ref 1.7–8.2)
WBC: 7.6 10*3/uL (ref 4.3–11.1)
nRBC: 0 10*3/uL (ref 0.0–0.2)

## 2021-06-01 LAB — BASIC METABOLIC PANEL
Anion Gap: 4 mmol/L — ABNORMAL LOW (ref 7–16)
BUN: 18 MG/DL (ref 8–23)
CO2: 32 mmol/L (ref 21–32)
Calcium: 8.6 MG/DL (ref 8.3–10.4)
Chloride: 108 mmol/L — ABNORMAL HIGH (ref 98–107)
Creatinine: 0.6 MG/DL — ABNORMAL LOW (ref 0.8–1.5)
GFR African American: >60 mL/min/{1.73_m2} (ref >60)
GFR Non-African American: >60 mL/min/{1.73_m2} (ref >60)
Glucose: 122 mg/dL — ABNORMAL HIGH (ref 65–100)
Potassium: 3.4 mmol/L — ABNORMAL LOW (ref 3.5–5.1)
Sodium: 144 mmol/L (ref 138–145)

## 2021-06-01 LAB — MAGNESIUM: Magnesium: 2 mg/dL (ref 1.8–2.4)

## 2021-06-01 LAB — VANCOMYCIN LEVEL, RANDOM: Vancomycin Rm: 12.6 UG/ML

## 2021-06-01 LAB — PHOSPHORUS: Phosphorus: 2 MG/DL — ABNORMAL LOW (ref 2.3–3.7)

## 2021-06-01 MED FILL — NICOTINE STEP 1 21 MG/24HR TD PT24: 21 mg/(24.h) | TRANSDERMAL | Qty: 1

## 2021-06-01 MED FILL — LIDOCAINE VISCOUS HCL 2 % MT SOLN: 2 % | OROMUCOSAL | Qty: 15

## 2021-06-01 MED FILL — OXYCODONE HCL 5 MG PO TABS: 5 mg | ORAL | Qty: 2

## 2021-06-01 MED FILL — PIPERACILLIN SOD-TAZOBACTAM SO 3.375 (3-0.375) G IV SOLR: 3.375 (3-0.375) g | INTRAVENOUS | Qty: 3375

## 2021-06-01 MED FILL — VITAMIN D3 25 MCG (1000 UT) PO TABS: 25 MCG (1000 UT) | ORAL | Qty: 1

## 2021-06-01 MED FILL — CHLORHEXIDINE GLUCONATE 0.12 % MT SOLN: 0.12 % | OROMUCOSAL | Qty: 15

## 2021-06-01 MED FILL — GABAPENTIN 100 MG PO CAPS: 100 mg | ORAL | Qty: 2

## 2021-06-01 MED FILL — IPRATROPIUM-ALBUTEROL 0.5-2.5 (3) MG/3ML IN SOLN: 0.5-2.5 (3) MG/3ML | RESPIRATORY_TRACT | Qty: 3

## 2021-06-01 MED FILL — ENOXAPARIN SODIUM 30 MG/0.3ML IJ SOSY: 30 MG/0.3ML | INTRAMUSCULAR | Qty: 0.3

## 2021-06-01 MED FILL — NOZIN NASAL SANITIZER 62 % NA KIT: 62 % | NASAL | Qty: 1

## 2021-06-01 MED FILL — TRAZODONE HCL 50 MG PO TABS: 50 mg | ORAL | Qty: 1

## 2021-06-01 MED FILL — LORAZEPAM 0.5 MG PO TABS: 0.5 mg | ORAL | Qty: 1

## 2021-06-01 MED FILL — VANCOMYCIN HCL 1 G IV SOLR: 1 g | INTRAVENOUS | Qty: 1000

## 2021-06-01 NOTE — Progress Notes (Signed)
Caleb Robinson  Admission Date: 05/29/2021         Daily Progress Note: 06/01/2021    The patient's chart is reviewed and the patient is discussed with the staff.    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 Pt was recently hospitalized 5/6-04/16/21 for acute on chronic respiratory failure and aspiration pneumonia. He was seen by his PCP on 6/21 and found to have a fever and diarrhea. Pt was admitted by the hospitalist service and we were consulted for therapeutic bronch. Pt underwent bronch 6/22 without any issues. Sputum cx is pending.     Subjective:     Pt up in room -     Current Facility-Administered Medications   Medication Dose Route Frequency   ??? vancomycin (VANCOCIN) 1,000 mg in sodium chloride 0.9 % 250 mL IVPB (Vial2Bag)  1,000 mg IntraVENous Q24H   ??? alcohol 62% (NOZIN) nasal sanitizer 1 ampule  1 ampule Topical Q12H   ??? albuterol sulfate HFA (PROVENTIL;VENTOLIN;PROAIR) 108 (90 Base) MCG/ACT inhaler 2 puff  2 puff Inhalation Q6H PRN   ??? chlorhexidine (PERIDEX) 0.12 % solution 15 mL  15 mL Mouth/Throat 4x Daily   ??? diphenoxylate-atropine (LOMOTIL) 2.5-0.025 MG per tablet 1 tablet  1 tablet Oral 4x Daily PRN   ??? fluticasone (FLONASE) 50 MCG/ACT nasal spray 2 spray  2 spray Nasal Daily   ??? fluticasone-umeclidin-vilant (TRELEGY ELLIPTA) 100-62.5-25 MCG/INH inhaler 1 puff (Patient Supplied)  1 puff Inhalation Daily   ??? gabapentin (NEURONTIN) capsule 200 mg  200 mg Oral TID   ??? ipratropium-albuterol (DUONEB) nebulizer solution 3 mL  3 mL Inhalation 6 times per day   ??? LORazepam (ATIVAN) tablet 0.5 mg  0.5 mg Oral Daily   ??? oxyCODONE (ROXICODONE) immediate release tablet 10 mg  10 mg Oral Q6H PRN   ??? sertraline (ZOLOFT) 20 MG/ML concentrated solution 50 mg (Patient Supplied)  50 mg Oral Daily   ???  traZODone (DESYREL) tablet 50 mg  50 mg Oral Nightly PRN   ??? Vitamin D (CHOLECALCIFEROL) tablet 1,000 Units  1,000 Int'l Units Oral Daily   ??? sodium chloride flush 0.9 % injection 5-40 mL  5-40 mL IntraVENous 2 times per day   ??? sodium chloride flush 0.9 % injection 5-40 mL  5-40 mL IntraVENous PRN   ??? 0.9 % sodium chloride infusion   IntraVENous PRN   ??? enoxaparin Sodium (LOVENOX) injection 30 mg  30 mg SubCUTAneous Q24H   ??? ondansetron (ZOFRAN-ODT) disintegrating tablet 4 mg  4 mg Oral Q8H PRN    Or   ??? ondansetron (ZOFRAN) injection 4 mg  4 mg IntraVENous Q6H PRN   ??? polyethylene glycol (GLYCOLAX) packet 17 g  17 g Oral Daily PRN   ??? acetaminophen (TYLENOL) tablet 650 mg  650 mg Oral Q6H PRN    Or   ??? acetaminophen (TYLENOL) suppository 650 mg  650 mg Rectal Q6H PRN   ??? piperacillin-tazobactam (ZOSYN) 3,375 mg in sodium chloride 0.9 % 50 mL IVPB (mini-bag)  3,375 mg IntraVENous q8h   ??? nicotine (NICODERM CQ) 21 MG/24HR 1 patch  1 patch TransDERmal Daily   ??? magnesium sulfate 2000 mg in 50 mL IVPB premix  2,000 mg IntraVENous PRN   ??? lidocaine viscous hcl (XYLOCAINE) 2 % solution 15 mL  15 mL  Mouth/Throat Q3H PRN     Review of Systems    Constitutional: negative for fever, chills, sweats  Cardiovascular: negative for chest pain, palpitations, syncope, edema  Gastrointestinal:  negative for dysphagia, reflux, vomiting, diarrhea, abdominal pain, or melena  Neurologic:  negative for focal weakness, numbness, headache  Objective:     Vitals:    06/01/21 0225 06/01/21 0543 06/01/21 0704 06/01/21 0724   BP: (!) 100/58  107/89    Pulse: 83  76 80   Resp: 18  18 18    Temp: 97.9 ??F (36.6 ??C)  98.8 ??F (37.1 ??C)    TempSrc:   Oral    SpO2: 91%  96% 93%   Weight:  92 lb 6.4 oz (41.9 kg)     Height:         @INTAKEOUTPUTBRIEF @  Physical Exam:   Constitution:  the patient is well developed and in no acute distress  HEENT:  Sclera clear, pupils equal, oral mucosa moist  Respiratory: decreased breath sounds   Cardiovascular:  RRR  without M,G,R  Gastrointestinal: soft and non-tender; with positive bowel sounds.  Musculoskeletal: warm without cyanosis. There is no lower extremity edema.  Skin:  no jaundice or rashes, no wounds   Neurologic: no gross neuro deficits     Psychiatric:  alert and oriented x 3    CXR:       LAB:  Recent Labs     05/29/21  1643 05/30/21  0333   WBC 16.7* 15.2*   HGB 9.8* 9.1*   HCT 31.1* 28.8*   PLT 231 249     Recent Labs     05/29/21  1945 05/30/21  0335   NA 141 144   K 4.4 4.1   CL 104 108*   CO2 31 31   BUN 33* 31*   BILITOT 0.4 0.3   0.3   AST 41* 34   34   ALT 25 22   22      Recent Labs     05/30/21  0335 05/30/21  0821   TROPHS 14.2* 11.0     No results for input(s): PH, PCO2, PO2, HCO3 in the last 72 hours.    Invalid input(s): PHI, PCO2I, PO2I, HCO3I  No results for input(s): SDES in the last 72 hours.    Invalid input(s): CULT  Assessment and Plan:  (Medical Decision Making)   Principal Problem:    Acute respiratory failure with hypoxemia ([place])  Plan: wean O2 as tolerated   Active Problems:    Anemia  Plan: monitor     Emphysema lung (H[facility])  Plan: continue nebs    Tongue cancer ([place])  Plan: s/p tongue resection    Hyperlipidemia  Plan: continue home meds    Anxiety and depression  Plan: continue home meds     Aspiration pneumonia ([place]) bilateral lower lobes- growing staph aureus? mrsa   Will check cxr tomorrow  : on vanc/zosyn day 4- can probably stop zosyn    Severe protein-calorie malnutrition ([place])  Plan: PEG tube in place, GI to replace in a few weeks, continue TFs    Tanya Marvin V Garry Heater, MD

## 2021-06-01 NOTE — Progress Notes (Signed)
Perfect serve message sent to Hospitalist on call pertaining to patient's PICC area swollen and forearm distal to PICC side swollen as well.  No redness, perfusion is present and patient has no paraesthesia present.  US duplex for RUE placed by doctor.

## 2021-06-01 NOTE — Progress Notes (Signed)
VANCO DAILY FOLLOW UP NOTE  [facility] Pharmacokinetic Monitoring Service - Vancomycin    Consulting Provider: Dr. Doren Custard   Indication: HAP  Target Concentration: Goal AUC/MIC 400-600 mg*hr/L  Day of Therapy: 3  Additional Antimicrobials: Pip/Tazo    Pertinent Laboratory Values:   Wt Readings from Last 1 Encounters:   05/30/21 88 lb (39.9 kg)     Temp Readings from Last 1 Encounters:   05/31/21 98.4 ??F (36.9 ??C) (Oral)     Recent Labs     05/29/21  1643 05/29/21  1945 05/30/21  0157 05/30/21  0333 05/30/21  0335   BUN  --  33*  --   --  31*   CREATININE  --  0.80  --   --  0.70*   WBC 16.7*  --   --  15.2*  --    PROCAL  --  31.02*  --   --   --    LACACIDPL 2.1*  --  0.6  --  0.7     Estimated Creatinine Clearance: 56 mL/min (A) (based on SCr of 0.7 mg/dL (L)).    Lab Results   Component Value Date    VANCORANDOM 19.8 05/31/2021       MRSA Nasal Swab: not ordered. Order placed by pharmacy..      Assessment:  Date/Time Dose Concentration AUC   6/23 0416 1000mg  q 18 hours 19.8 648   6/24 0928 1000 mg q 24 hours 12.6 427   Note: Serum concentrations collected for AUC dosing may appear elevated if collected in close proximity to the dose administered, this is not necessarily an indication of toxicity    Plan:  Current dosing regimen is therapeutic  Continue current dose  Repeat vancomycin concentrations will be ordered as clinically appropriate   Pharmacy will continue to monitor patient and adjust therapy as indicated    Thank you for the consult,  Zelphia Cairo Dalphine Cowie, [place]

## 2021-06-01 NOTE — Progress Notes (Signed)
SPEECH PATHOLOGY NOTE     Speech therapy follow up attempted this PM. Patient currently off the floor for procedure. Will follow up at later time.     Delight Stare, MSP, CCC-SLP  Speech Language Pathologist  Acute Rehabilitation Services  Contact: PerfectServe

## 2021-06-01 NOTE — Plan of Care (Signed)
Problem: Respiratory - Adult  Goal: Achieves optimal ventilation and oxygenation  06/01/2021 0943 by Illene Labrador, RCP  Outcome: Progressing  05/31/2021 2318 by Evonnie Dawes, RN  Outcome: Progressing

## 2021-06-01 NOTE — Progress Notes (Addendum)
Chart reviewed by [facility] and discussed in IDR. Patient s/p bronch for asp pna, growing staph aureus. Pulm folloiwng CXR ordered for 06/25. Patient with PEG, IntraMed made aware of admission and to resume TF at discharge. Speech approved thin liquids for pleasure. Patient currently on [facility] Ms, does not wear home O2. Will need home O2 eval when stable.   CM following.

## 2021-06-01 NOTE — Progress Notes (Signed)
Hospitalist Progress Note   Admit Date:  05/29/2021  6:36 PM   Name:  Caleb Robinson   Age:  70 y.o.  Sex:  male  DOB:  [birth year]   MRN:  [id number]   Room:  212/01    Presenting Complaint: Fatigue     Reason(s) for Admission: Hypoxia [R09.02]  Acute respiratory failure with hypoxemia ([place]) [J96.01]  Pneumonia of both lower lobes due to infectious organism [J18.9]     Hospital Course & Interval History:   Please refer to the admission H&P for details of presentation.      In summary, Caleb Robinson is a 70 y.o. 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  Meets criteria for sepsis with leukocytosis and tachypnea.  Noted to be hypoxic at 88% on room air requiring 2 L of O2 nasal cannula.  Chest x-ray shows increased bibasilar infiltrate concerning for aspiration pneumonia.    Subjective/24 hr Events (06/01/21) :  Patient is seen and examined at bedside.  No acute events reported overnight by nursing staff.  Sitting up in bed. Recently had MBBS test done by SLP.  Reports cough which has improved. SOB at times.   Currently on 2 L O2 nasal cannula.  Patient denies fever, chills, chest pains, shortness of breath, n/v, abdominal pain.    Review of Systems: 10 point review of systems is otherwise negative with the exception of the elements mentioned above.        Assessment & Plan:     Acute respiratory failure with hypoxemia concerning for Aspiration pneumonia  Emphysema  MRSA Infection   Patient was noted to be hypoxic with saturation of 88% on room air in the emergency room with respiratory distress.  06/01/21: Continues to require O2 supplement which is not his baseline. S/p Bronch yesterday with  BAL +staph aureus, BCX (6/21 and 6/22) neg  to date. Respiratory viral panel neg.  - pulmonary recs appreciated  - Dc zosyn given BAL results. BCx neg to date  - continue with Vancomycin for now   - O2 supplement and wean as tolerated  - 6MWT    Dysphagia   MBBS with severe oral dysphagia  - Appreciate SLP.  Risk for aspiration    Anemia  Likely of chronic disease given underlying malignancy and poor nutrition.  Hb of 9.8 on admission.  Dropped to 9.1 today.  06/01/21: Iron of 12 with ferritin of 101.  We will give 1 dose of IV iron.  - monitor   - transfuse Hb to keep > 7    Right Arm swelling  Swelling around PICC line. NO pain  - check [place] to rule out DVT  - PICC consulted to eval and replace PICC line.    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 Planned for outpatient exchange in 2 weeks.    Anxiety and depression: Resume home Zoloft    Diet:  Diet NPO  ADULT TUBE FEEDING; PEG; Standard with Fiber; Bolus; 4 Times Daily; 240; Syringe Push; 100; Before and after each bolus  DVT PPx: lovenox  Code status: Full Code  I have reviewed and ordered clinical lab tests and independently visualized images, tracing.   I spent 30 minutes of time caring for this patient at bedside or nearby, more than 50 percent of which was spent on coordination of care and/or counseling regarding the disease process, treatment options, and treatment plan.        Hospital Problems:  Principal Problem:    Acute respiratory failure with hypoxemia (HCC)  Active Problems:    Anemia    Pneumonia of both lower lobes due to infectious organism    MRSA (methicillin resistant Staphylococcus aureus) infection    Oral phase dysphagia    S/P glossectomy    Localized swelling of right upper extremity    Emphysema lung (HCC)    Tongue cancer (HCC)    Hyperlipidemia    Anxiety and depression    Aspiration pneumonia (HCC)    Severe protein-calorie malnutrition  ([place])  Resolved Problems:    * No resolved hospital problems. *      Objective:     Patient Vitals for the past 24 hrs:   Temp Pulse Resp BP SpO2   06/01/21 1151 98.1 ??F (36.7 ??C) 74 22 102/65 93 %   06/01/21 0724 -- 80 18 -- 93 %   06/01/21 0704 98.8 ??F (37.1 ??C) 76 18 107/89 96 %   06/01/21 0225 97.9 ??F (36.6 ??C) 83 18 (!) 100/58 91 %   06/01/21 0018 -- 75 20 -- 96 %   05/31/21 2354 98.2 ??F (36.8 ??C) 78 19 125/62 95 %   05/31/21 2108 -- 81 20 -- 94 %   05/31/21 1942 98.2 ??F (36.8 ??C) 99 18 100/62 94 %   05/31/21 1555 -- -- -- -- 93 %       Oxygen Therapy  SpO2: 93 %  Pulse via Oximetry: 62 beats per minute  Pulse Oximeter Device Mode: Intermittent  O2 Device: Nasal cannula  O2 Flow Rate (L/min): 2 L/min    Estimated body mass index is 15.86 kg/m?? as calculated from the following:    Height as of this encounter: 5\' 4"  (1.626 m).    Weight as of this encounter: 92 lb 6.4 oz (41.9 kg).    Intake/Output Summary (Last 24 hours) at 06/01/2021 1500  Last data filed at 06/01/2021 0850  Gross per 24 hour   Intake 1840 ml   Output 0 ml   Net 1840 ml         Physical Exam:     Blood pressure 102/65, pulse 74, temperature 98.1 ??F (36.7 ??C), temperature source Oral, resp. rate 22, height 5\' 4"  (1.626 m), weight 92 lb 6.4 oz (41.9 kg), SpO2 93 %.     General:    Cachetic, chronically ill appearing     Head:  Normocephalic, atraumatic  Eyes:  Sclerae appear normal.  Pupils equally round.  ENT:  Nares appear normal, no drainage.  Moist oral mucosa  Neck:  No restricted ROM.  Trachea midline   CV:   RRR.  No m/r/g.  No jugular venous distension.  Lungs:   Coarse breath sounds, no wheezing  Abdomen:   Bowel sounds present.  Soft, nontender, nondistended. +PEG  Extremities: No cyanosis or clubbing.  No edema  Skin:     No rashes and normal coloration.   Warm and dry.    Neuro:  CN II-XII grossly intact.  A&Ox3  Psych:  Normal mood and affect.      I have reviewed ordered lab  tests and independently visualized imaging below:    Recent  Labs:  Recent Results (from the past 48 hour(s))   PLEASE READ & DOCUMENT PPD TEST IN 24 HRS    Collection Time: 05/31/21 12:00 AM   Result Value Ref Range    PPD, (POC) Negative Negative    mm Induration 0 0 - 5 mm   Vancomycin Level, Random    Collection Time: 05/31/21  4:16 AM   Result Value Ref Range    Vancomycin Rm 19.8 UG/ML   Vancomycin Level, Random    Collection Time: 06/01/21  9:28 AM   Result Value Ref Range    Vancomycin Rm 12.6 UG/ML       Other Studies:  FL MODIFIED BARIUM SWALLOW W VIDEO   Final Result   Normal swallowing evaluation with thin barium liquids administered   via syringe. For more detailed evaluation of the swallowing study please refer   to the separately dictated speech pathologist report         XR CHEST PORTABLE   Final Result      1. Findings as described above.       621      XR CHEST (2 VW)   Final Result   Increased bibasilar infiltrate         Vascular duplex upper extremity venous right    (Results Pending)   XR CHEST 1 VIEW    (Results Pending)       Current Meds:  Current Facility-Administered Medications   Medication Dose Route Frequency   ??? vancomycin (VANCOCIN) 1,000 mg in sodium chloride 0.9 % 250 mL IVPB (Vial2Bag)  1,000 mg IntraVENous Q24H   ??? alcohol 62% (NOZIN) nasal sanitizer 1 ampule  1 ampule Topical Q12H   ??? albuterol sulfate HFA (PROVENTIL;VENTOLIN;PROAIR) 108 (90 Base) MCG/ACT inhaler 2 puff  2 puff Inhalation Q6H PRN   ??? chlorhexidine (PERIDEX) 0.12 % solution 15 mL  15 mL Mouth/Throat 4x Daily   ??? diphenoxylate-atropine (LOMOTIL) 2.5-0.025 MG per tablet 1 tablet  1 tablet Oral 4x Daily PRN   ??? fluticasone (FLONASE) 50 MCG/ACT nasal spray 2 spray  2 spray Nasal Daily   ??? fluticasone-umeclidin-vilant (TRELEGY ELLIPTA) 100-62.5-25 MCG/INH inhaler 1 puff (Patient Supplied)  1 puff Inhalation Daily   ??? gabapentin (NEURONTIN) capsule 200 mg  200 mg Oral TID   ??? ipratropium-albuterol (DUONEB) nebulizer solution 3 mL  3 mL Inhalation 6 times per day   ??? LORazepam  (ATIVAN) tablet 0.5 mg  0.5 mg Oral Daily   ??? oxyCODONE (ROXICODONE) immediate release tablet 10 mg  10 mg Oral Q6H PRN   ??? sertraline (ZOLOFT) 20 MG/ML concentrated solution 50 mg (Patient Supplied)  50 mg Oral Daily   ??? traZODone (DESYREL) tablet 50 mg  50 mg Oral Nightly PRN   ??? Vitamin D (CHOLECALCIFEROL) tablet 1,000 Units  1,000 Int'l Units Oral Daily   ??? sodium chloride flush 0.9 % injection 5-40 mL  5-40 mL IntraVENous 2 times per day   ??? sodium chloride flush 0.9 % injection 5-40 mL  5-40 mL IntraVENous PRN   ??? 0.9 % sodium chloride infusion   IntraVENous PRN   ??? enoxaparin Sodium (LOVENOX) injection 30 mg  30 mg SubCUTAneous Q24H   ??? ondansetron (ZOFRAN-ODT) disintegrating tablet 4 mg  4 mg Oral Q8H PRN    Or   ??? ondansetron (ZOFRAN) injection 4 mg  4 mg IntraVENous Q6H PRN   ??? polyethylene glycol (GLYCOLAX) packet 17 g  17 g Oral Daily PRN   ??? acetaminophen (TYLENOL) tablet 650 mg  650 mg Oral Q6H PRN    Or   ??? acetaminophen (TYLENOL) suppository 650 mg  650 mg Rectal Q6H PRN   ??? nicotine (NICODERM CQ) 21 MG/24HR 1 patch  1 patch TransDERmal Daily   ??? magnesium sulfate 2000 mg in 50 mL IVPB premix  2,000 mg IntraVENous PRN   ??? lidocaine viscous hcl (XYLOCAINE) 2 % solution 15 mL  15 mL Mouth/Throat Q3H PRN       Signed:  Helmut Muster, MD    Part of this note may have been written by using a voice dictation software.  The note has been proof read but may still contain some grammatical/other typographical errors.

## 2021-06-01 NOTE — Progress Notes (Addendum)
Comprehensive Nutrition Assessment    Type and Reason for Visit: Reassess  Tube Feeding Management (Hospitalists)    Nutrition Recommendations/Plan:   Enteral Nutrition:   Enteral Access: PEG  Formula: Standard with Fiber (Jevity 1.5 Cal)  Delivery: Bolus 1 carton times 4/day. Suggested feeding times 0800, 1200, 1600, 2000  Water flush Continue 100 ml before and after each bolus  Modulars: None not indicated at this time   Enteral regimen at goal to provide per 24 hours:  1420 calories (100% estimated calorie needs), 60.4 grams protein (100% estimated protein needs) and 1511m free fluid (1.07 ml/kcal).  IV Fluids:  Continue per MD order   Nutritional Supplement Therapy:   Implement electrolyte replacement per nutrition support protocols  Replacement indicated:  Active- potassium and phos replacement placed by Dr. GForrestine Him Labs:   EN labs: BMP MWF, Mg MWF and Phos MWF.     POC Glucoses/SSI Not indicated  Meals and Snacks:  Diet per provider. SLP recommended thin liquids for pleasure.     Malnutrition Assessment:  Malnutrition Status: Moderate malnutrition  Context: Chronic Illness  Findings of clinical characteristics of malnutrition:   Energy Intake:  Mild decrease in energy intake (Comment) (TF held for 3 days)  Weight Loss:  No significant weight loss     Body Fat Loss:  Mild body fat loss (moderate) Orbital,Buccal region   Muscle Mass Loss:  Mild muscle mass loss (mild to moderate) Clavicles (pectoralis & deltoids),Calf (gastrocnemius),Scapula (trapezius)  Fluid Accumulation:  No significant fluid accumulation    Grip Strength:  Not Performed  Nutrition Assessment:  Nutrition History: Pt TF dependent via PEG. RD note from cancer center notes patient had been tolerating Nutren 1.5, 4-5 cartons per day at home. SLP unable to consult as outpatient d/t home health services to maintain his IVF. Patient is proficient at giving his own boluses and medications via PEG. Supplements with Vit A, E, C, D, potassium and  melatonin at home. States his doctor is aware of all supplements. Voices frustration with limited products for weight gain (most are targeted for weight loss). [employer] supplies his home tube feeding supplies, but gets formula from [employer].      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 Dx throat and tongue cancer. Plan for outpatient exchange of PEG tube in 2 weeks. Per SLP okay for thin liquids for pleasure.   Nutrition Interval:  Visited with patient, reclined in bed. Discussed home regimen and supplements- instructed patient that 4-5 cartons of tube feeding will meet his vitamin and mineral needs for the day and that it is possible to accumulate a vitamin toxicity-especially the fat soluable vitamins. He states his doctor is aware of all his supplements. Encouraged patient to select OTC tube feeding substitutes that are high in kcals (350 kcal or more) and protein (15-20 g or more) per 8 oz. He agreed and was able to talk about amounts in various products he has tried.   Tolerating bolus well. Discussed with DAndee Poles RN.   There was a delay with labs today, but they were redrawn and resulted.     Dr GForrestine Himnotified via [employer] of low phos (2.0) and agreed to order electrolyte replacement protocol for phos and potassium.    Abdominal Status (last documented):   Last BM (including prior to admit): 05/31/21,   prn meds  in place.   Pertinent Medications: polyethelene glycol prn (none given), vit D, vanc, zofran prn (none given), lomotil prn, ferric gluconate given 6/23  Continuous: n/a  IVF: prn NS  Pertinent Labs:   Lab Results   Component Value Date    NA 144 06/01/2021    K 3.4 06/01/2021    CL 108 06/01/2021    CO2 32 06/01/2021    BUN 18 06/01/2021    CREATININE 0.60 06/01/2021    GLUCOSE 122 06/01/2021    CALCIUM 8.6 06/01/2021    PHOS 2.0 06/01/2021     MG 2.0 06/01/2021    K 3.4 and Phos 2.0- MD to order replacement protocol.     Nutrition Related Findings:   NFPE revealed significant wasting.       Current Nutrition Therapies:  Diet NPO  ADULT TUBE FEEDING; PEG; Standard with Fiber; Bolus; 4 Times Daily; 240; Syringe Push; 100; Before and after each bolus   Current Tube Feeding (TF) Orders:  ?? Feeding Route: PEG  ?? Formula: Standard with Fiber  ?? Schedule: Bolus  ?? Feeding Regimen: Bolus 1 carton times 4/day. Suggested feeding times 0800, 1200, 1600, 2000  ?? Additives/Modulars: None  ?? Water Flushes: 100 ml before and after each bolus  ?? Current TF & Flush Orders Provides:    ?? Goal TF & Flush Orders Provides: 1420 calories (100% estimated calorie needs), 60.4 grams protein (100% estimated protein needs) and 1510m free fluid (1.07 ml/kcal).    Current Intake:   Average Meal Intake: NPO        Anthropometric Measures:  Height: 5' 4"  (162.6 cm)  Current Body Wt: 92 lb 6 oz (41.9 kg) (6/24), Weight source: Bed Scale  BMI: 15.8  Admission Body Weight: 198 lb 6.6 oz (90 kg) (6/21 stated)  Ideal Body Weight (Kg) (Calculated): 59 kg (130 lbs), 68.9 %  Usual Body Wt:  (89-93# per EMR an cancer center RD), Percent weight change:         Edema:    BMI Category Underweight (BMI less than 22) age over 654 Estimated Daily Nutrient Needs:  Energy (kcal/day): [address](Kcal/kg (35-40) Weight used: 41.9 kg Current  Protein (g/day): 40-48 g (1-1.2 g/kg) Weight Used: (Current) 40 kg  Fluid (ml/day):   (1 ml/kcal)    Nutrition Diagnosis:   ?? Inadequate protein-energy intake related to altered GI structure (glossectomy, tube feeding dependence) as evidenced by  (NPO, tf dependent)    Nutrition Interventions:   Food and/or Nutrient Delivery: Continue Current Tube Feeding  Nutrition Education/Counseling: Survival skills/brief education completed (r/t 4-5 cartons of TF will meet micronutrient needs)  Coordination of Nutrition Care: Continue to monitor while inpatient  Plan of Care  discussed with: DAndee Poles RN    Goals:   Previous Goal Met: Goal(s) Achieved  Active Goal: Tolerate nutrition support at goal rate,by next RD assessment       Nutrition Monitoring and Evaluation:      Food/Nutrient Intake Outcomes: Enteral Nutrition Intake/Tolerance  Physical Signs/Symptoms Outcomes: Biochemical Data,GI Status,Nutrition Focused Physical Findings,Weight    Discharge Planning:    Enteral Nutrition    AGeorgann Housekeeper RIvey LD  Contact: [phone]

## 2021-06-01 NOTE — Progress Notes (Signed)
Patient denies any needs at this time. Bed in low position, locked and call light/personal items within reach. Patient not passing flatus, denies pain and is tolerating bolus feedings.  Patient had new onset swelling at PICC site, [place] to be completed this morning.  Will continue to monitor and give report to day shift nurse.

## 2021-06-01 NOTE — Progress Notes (Signed)
ask to evaluate picc due to swelling.  r upper arm [place] was neg for dvt.  Per pt chart  upper arm measured 20cm at insertion site before picc was placed  and our dressings are 2 cm thick  and now measuring over dressing pts arm is 21cm  i don't see any swelling in upper arm. Pt states it is not painful.  picc has blood return and flushes well.  will re-evaluate tomorrow to see if there are any changes.  picc line is ok to use.

## 2021-06-01 NOTE — Progress Notes (Signed)
PHYSICAL THERAPY Daily Note and AM  (Link to Caseload Tracking: PT Visit Days : 1  Time In/Out  PT Charge Capture   Rehab Caseload Tracker   Orders      Caleb Robinson is a 70 y.o. male   PRIMARY DIAGNOSIS: Acute respiratory failure with hypoxemia (HCC)  Hypoxia [R09.02]  Acute respiratory failure with hypoxemia ([place]) [J96.01]  Pneumonia of both lower lobes due to infectious organism [J18.9]  Procedure(s) (LRB):  BRONCHOSCOPY (N/A)  2 Days Post-Op  Inpatient: Payor: [employer] MEDICARE / Plan: [place] / Product Type: *No Product type* /     ASSESSMENT:     REHAB RECOMMENDATIONS:   Recommendation to date pending progress:  Setting:  ??? No further skilled therapy after discharge from hospital    Equipment:   ???  None     ASSESSMENT:  Caleb Robinson is sitting edge of bed and agreeable to therapy.  Patient is such a pleasant and enjoyable man.  Mobility is mostly independent.  Gait training with slight hand held assist x 250 feet with slow but steady cadence.  Patient is returned to the room and sat edge of bed saturation checked and found to be low O2 replaced and with a few deep breaths it did increase to 90%.  Patient encouraged to leave O2 on and RN made aware.  Good session and progress demonstrated.  Continue PT efforts.  No further therapy needed at d/c.     SUBJECTIVE:   Caleb Robinson states, "Come on in"     Social/Functional Lives With: Other (comment) (ex-wife)  Type of Home: Apartment  Home Layout: One level  Home Access: Level entry  Has the patient had two or more falls in the past year or any fall with injury in the past year?: No  ADL Assistance: Independent  Ambulation Assistance: Independent  Transfer Assistance: Independent  Active Driver: Yes  OBJECTIVE:     PAIN: VITALS / O2: PRECAUTION / LINES / DRAINS:   Pre Treatment:  0/10         Post Treatment: 0/10 Vitals    90%    Oxygen    O2    RESTRICTIONS/PRECAUTIONS:        MOBILITY: I Mod I S SBA CGA Min Mod Max Total  NT x2 Comments:    Bed Mobility    Rolling []  []  []  []  []  []  []  []  []  [x]  []     Supine to Sit []  []  []  []  []  []  []  []  []  [x]  []     Scooting []  []  []  []  []  []  []  []  []  [x]  []     Sit to Supine []  []  []  []  []  []  []  []  []  [x]  []     Transfers    Sit to Stand [x]  []  []  []  []  []  []  []  []  []  []     Bed to Chair []  []  []  []  []  []  []  []  []  [x]  []     Stand to Sit [x]  []  []  []  []  []  []  []  []  []  []      []  []  []  []  []  []  []  []  []  []  []     I=Independent, Mod I=Modified Independent, S=Supervision, SBA=Standby Assistance, CGA=Contact Guard Assistance,   Min=Minimal Assistance, Mod=Moderate Assistance, Max=Maximal Assistance, Total=Total Assistance, NT=Not Tested    BALANCE: Good Fair+ Fair Fair- Poor NT Comments   Sitting Static [x]  []  []  []  []  []     Sitting Dynamic [x]  []  []  []  []  []   Standing Static [x]  []  []  []  []  []     Standing Dynamic [x]  []  []  []  []  []       GAIT: I Mod I S SBA CGA Min Mod Max Total  NT x2 Comments:   Level of Assistance []  []  []  []  [x]  []  []  []  []  []  []     Distance  250 feet    DME slight hand held assist     Gait Quality Antalgic    Weightbearing Status      Stairs      I=Independent, Mod I=Modified Independent, S=Supervision, SBA=Standby Assistance, CGA=Contact Guard Assistance,   Min=Minimal Assistance, Mod=Moderate Assistance, Max=Maximal Assistance, Total=Total Assistance, NT=Not Tested    PLAN:   ACUTE PHYSICAL THERAPY GOALS:   (Developed with and agreed upon by patient and/or caregiver.)    (1.) Caleb Robinson  will move from supine to sit and sit to supine  with INDEPENDENCE within 7 treatment day(s).    (2.) Caleb Robinson will transfer from bed to chair and chair to bed with INDEPENDENCE using the least restrictive device within 7 treatment day(s).    (3.) Caleb Robinson will ambulate with INDEPENDENCE for 500 feet with the least restrictive device while maintaining SpO2 >90% within 7 treatment day(s).   (4.) Caleb Robinson will perform standing static and  dynamic balance activities x 20 minutes with SUPERVISION to improve safety within 7 treatment day(s).  (5.) Caleb Robinson will perform bilateral lower extremity exercises x 20 min for HEP with INDEPENDENCE to improve strength, endurance, and functional mobility within 7 treatment day(s).   ??    FREQUENCY AND DURATION: 2 times/week for duration of hospital stay or until stated goals are met, whichever comes first.    TREATMENT:   TREATMENT:   Therapeutic Activity (11 Minutes): Therapeutic activity included Transfer Training, Ambulation on level ground, Sitting balance  and Standing balance to improve functional Activity tolerance, Balance, Coordination, Mobility and Strength.    TREATMENT GRID:  N/A    AFTER TREATMENT PRECAUTIONS: Bed/Chair Locked, Call light within reach, Needs within reach, RN notified and sitting edge of bed    INTERDISCIPLINARY COLLABORATION:  RN/ PCT and PT/ PTA    EDUCATION:      TIME IN/OUT:  Time In: 1040  Time Out: [place]  Minutes: 11    Caleb Robinson, PTA

## 2021-06-01 NOTE — Plan of Care (Signed)
Problem: Discharge Planning  Goal: Discharge to home or other facility with appropriate resources  Outcome: Progressing  Flowsheets (Taken 06/01/2021 1908)  Discharge to home or other facility with appropriate resources:   Identify barriers to discharge with patient and caregiver   Arrange for needed discharge resources and transportation as appropriate   Identify discharge learning needs (meds, wound care, etc)   Refer to discharge planning if patient needs post-hospital services based on physician order or complex needs related to functional status, cognitive ability or social support system     Problem: Skin/Tissue Integrity  Goal: Absence of new skin breakdown  Description: 1.  Monitor for areas of redness and/or skin breakdown  2.  Assess vascular access sites hourly  3.  Every 4-6 hours minimum:  Change oxygen saturation probe site  4.  Every 4-6 hours:  If on nasal continuous positive airway pressure, respiratory therapy assess nares and determine need for appliance change or resting period.  Outcome: Progressing     Problem: Safety - Adult  Goal: Free from fall injury  Outcome: Progressing  Flowsheets (Taken 06/01/2021 1915)  Free From Fall Injury: Instruct family/caregiver on patient safety     Problem: ABCDS Injury Assessment  Goal: Absence of physical injury  Outcome: Progressing  Flowsheets (Taken 06/01/2021 1915)  Absence of Physical Injury: Implement safety measures based on patient assessment     Problem: Pain  Goal: Verbalizes/displays adequate comfort level or baseline comfort level  Outcome: Progressing     Problem: Respiratory - Adult  Goal: Achieves optimal ventilation and oxygenation  06/01/2021 2134 by Megan Mans, RN  Outcome: Progressing  Flowsheets (Taken 06/01/2021 1908)  Achieves optimal ventilation and oxygenation:   Assess for changes in respiratory status   Assess for changes in mentation and behavior   Position to facilitate oxygenation and minimize respiratory effort   Oxygen  supplementation based on oxygen saturation or arterial blood gases   Respiratory therapy support as indicated  06/01/2021 0943 by Illene Labrador, RCP  Outcome: Progressing

## 2021-06-02 ENCOUNTER — Inpatient Hospital Stay: Admit: 2021-06-02 | Payer: MEDICARE | Primary: Family Medicine

## 2021-06-02 LAB — BASIC METABOLIC PANEL W/ REFLEX TO MG FOR LOW K
Anion Gap: 6 mmol/L — ABNORMAL LOW (ref 7–16)
BUN: 15 MG/DL (ref 8–23)
CO2: 31 mmol/L (ref 21–32)
Calcium: 9 MG/DL (ref 8.3–10.4)
Chloride: 106 mmol/L (ref 98–107)
Creatinine: 0.6 MG/DL — ABNORMAL LOW (ref 0.8–1.5)
GFR African American: >60 mL/min/{1.73_m2} (ref >60)
GFR Non-African American: >60 mL/min/{1.73_m2} (ref >60)
Glucose: 102 mg/dL — ABNORMAL HIGH (ref 65–100)
Potassium: 3.5 mmol/L (ref 3.5–5.1)
Sodium: 143 mmol/L (ref 138–145)

## 2021-06-02 LAB — CULTURE, RESPIRATORY
Culture: NORMAL
Gram stain: 0
Gram stain: 20

## 2021-06-02 LAB — CBC WITH AUTO DIFFERENTIAL
Absolute Eos #: 0.2 10*3/uL (ref 0.0–0.8)
Absolute Immature Granulocyte: 0.1 10*3/uL (ref 0.0–0.5)
Absolute Lymph #: 0.7 10*3/uL (ref 0.5–4.6)
Absolute Mono #: 0.8 10*3/uL (ref 0.1–1.3)
Basophils Absolute: 0.1 10*3/uL (ref 0.0–0.2)
Basophils: 1 % (ref 0.0–2.0)
Eosinophils %: 2 % (ref 0.5–7.8)
Hematocrit: 28.4 % — ABNORMAL LOW (ref 41.1–50.3)
Hemoglobin: 8.8 g/dL — ABNORMAL LOW (ref 13.6–17.2)
Immature Granulocytes: 1 % (ref 0.0–5.0)
Lymphocytes: 9 % — ABNORMAL LOW (ref 13–44)
MCH: 30.2 PG (ref 26.1–32.9)
MCHC: 31 g/dL — ABNORMAL LOW (ref 31.4–35.0)
MCV: 97.6 FL (ref 79.6–97.8)
MPV: 10.8 FL (ref 9.4–12.3)
Monocytes: 10 % (ref 4.0–12.0)
Platelets: 260 10*3/uL (ref 150–450)
RBC: 2.91 M/uL — ABNORMAL LOW (ref 4.23–5.6)
RDW: 16.6 % — ABNORMAL HIGH (ref 11.9–14.6)
Seg Neutrophils: 77 % (ref 43–78)
Segs Absolute: 6.2 10*3/uL (ref 1.7–8.2)
WBC: 8 10*3/uL (ref 4.3–11.1)
nRBC: 0 10*3/uL (ref 0.0–0.2)

## 2021-06-02 LAB — PLEASE READ & DOCUMENT PPD TEST IN 48 HRS
PPD, (POC): NEGATIVE
mm Induration: 0 mm (ref 0–5)

## 2021-06-02 LAB — MAGNESIUM: Magnesium: 1.8 mg/dL (ref 1.8–2.4)

## 2021-06-02 LAB — PLEASE READ & DOCUMENT PPD TEST IN 72 HRS
PPD, (POC): NEGATIVE
mm Induration: 0 mm (ref 0–5)

## 2021-06-02 LAB — PHOSPHORUS: Phosphorus: 2.2 MG/DL — ABNORMAL LOW (ref 2.3–3.7)

## 2021-06-02 MED ORDER — IPRATROPIUM-ALBUTEROL 0.5-2.5 (3) MG/3ML IN SOLN
RESPIRATORY_TRACT | Status: DC | PRN
Start: 2021-06-02 — End: 2021-06-04

## 2021-06-02 MED FILL — NOZIN NASAL SANITIZER 62 % NA KIT: 62 % | NASAL | Qty: 1

## 2021-06-02 MED FILL — IPRATROPIUM-ALBUTEROL 0.5-2.5 (3) MG/3ML IN SOLN: 0.5-2.5 (3) MG/3ML | RESPIRATORY_TRACT | Qty: 3

## 2021-06-02 MED FILL — GABAPENTIN 100 MG PO CAPS: 100 mg | ORAL | Qty: 2

## 2021-06-02 MED FILL — VITAMIN D3 25 MCG (1000 UT) PO TABS: 25 MCG (1000 UT) | ORAL | Qty: 1

## 2021-06-02 MED FILL — ENOXAPARIN SODIUM 30 MG/0.3ML IJ SOSY: 30 MG/0.3ML | INTRAMUSCULAR | Qty: 0.3

## 2021-06-02 MED FILL — CHLORHEXIDINE GLUCONATE 0.12 % MT SOLN: 0.12 % | OROMUCOSAL | Qty: 15

## 2021-06-02 MED FILL — NICOTINE STEP 1 21 MG/24HR TD PT24: 21 mg/(24.h) | TRANSDERMAL | Qty: 1

## 2021-06-02 MED FILL — OXYCODONE HCL 5 MG PO TABS: 5 mg | ORAL | Qty: 2

## 2021-06-02 MED FILL — VANCOMYCIN HCL 1 G IV SOLR: 1 g | INTRAVENOUS | Qty: 1000

## 2021-06-02 MED FILL — LORAZEPAM 0.5 MG PO TABS: 0.5 mg | ORAL | Qty: 1

## 2021-06-02 NOTE — Progress Notes (Signed)
Caleb Robinson  Admission Date: 05/29/2021         Daily Progress Note: 06/02/2021    The patient's chart is reviewed and the patient is discussed with the staff.    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 Pt was recently hospitalized 5/6-04/16/21 for acute on chronic respiratory failure and aspiration pneumonia. He was seen by his PCP on 6/21 and found to have a fever and diarrhea. Pt was admitted by the hospitalist service and we were consulted for therapeutic bronch.??Pt underwent bronch 6/22 with minimal secretions. Sputum cx with heavy MRSA.??    Subjective:     Patient currently on 2L o2. States he feels a little rattle in his chest but is not coughing much of anything up. He is asking about home O2. Has never had to use O2 at home before.     Current Facility-Administered Medications   Medication Dose Route Frequency   ??? vancomycin (VANCOCIN) 1,000 mg in sodium chloride 0.9 % 250 mL IVPB (Vial2Bag)  1,000 mg IntraVENous Q24H   ??? alcohol 62% (NOZIN) nasal sanitizer 1 ampule  1 ampule Topical Q12H   ??? albuterol sulfate HFA (PROVENTIL;VENTOLIN;PROAIR) 108 (90 Base) MCG/ACT inhaler 2 puff  2 puff Inhalation Q6H PRN   ??? chlorhexidine (PERIDEX) 0.12 % solution 15 mL  15 mL Mouth/Throat 4x Daily   ??? diphenoxylate-atropine (LOMOTIL) 2.5-0.025 MG per tablet 1 tablet  1 tablet Oral 4x Daily PRN   ??? fluticasone (FLONASE) 50 MCG/ACT nasal spray 2 spray  2 spray Nasal Daily   ??? fluticasone-umeclidin-vilant (TRELEGY ELLIPTA) 100-62.5-25 MCG/INH inhaler 1 puff (Patient Supplied)  1 puff Inhalation Daily   ??? gabapentin (NEURONTIN) capsule 200 mg  200 mg Oral TID   ??? ipratropium-albuterol (DUONEB) nebulizer solution 3 mL  3 mL Inhalation 6 times per day   ??? LORazepam (ATIVAN) tablet 0.5 mg  0.5 mg Oral Daily   ??? oxyCODONE  (ROXICODONE) immediate release tablet 10 mg  10 mg Oral Q6H PRN   ??? sertraline (ZOLOFT) 20 MG/ML concentrated solution 50 mg (Patient Supplied)  50 mg Oral Daily   ??? traZODone (DESYREL) tablet 50 mg  50 mg Oral Nightly PRN   ??? Vitamin D (CHOLECALCIFEROL) tablet 1,000 Units  1,000 Int'l Units Oral Daily   ??? sodium chloride flush 0.9 % injection 5-40 mL  5-40 mL IntraVENous 2 times per day   ??? sodium chloride flush 0.9 % injection 5-40 mL  5-40 mL IntraVENous PRN   ??? 0.9 % sodium chloride infusion   IntraVENous PRN   ??? enoxaparin Sodium (LOVENOX) injection 30 mg  30 mg SubCUTAneous Q24H   ??? ondansetron (ZOFRAN-ODT) disintegrating tablet 4 mg  4 mg Oral Q8H PRN    Or   ??? ondansetron (ZOFRAN) injection 4 mg  4 mg IntraVENous Q6H PRN   ??? polyethylene glycol (GLYCOLAX) packet 17 g  17 g Oral Daily PRN   ??? acetaminophen (TYLENOL) tablet 650 mg  650 mg Oral Q6H PRN    Or   ??? acetaminophen (TYLENOL) suppository 650 mg  650 mg Rectal Q6H PRN   ??? nicotine (NICODERM CQ) 21 MG/24HR 1 patch  1 patch TransDERmal Daily   ??? magnesium sulfate 2000 mg in 50 mL IVPB premix  2,000 mg IntraVENous PRN   ??? lidocaine viscous hcl (XYLOCAINE) 2 %  solution 15 mL  15 mL Mouth/Throat Q3H PRN     Review of Systems  Constitutional: negative for fever, chills, sweats  Cardiovascular: negative for chest pain, palpitations, syncope, edema  Gastrointestinal:  negative for dysphagia, reflux, vomiting, diarrhea, abdominal pain, or melena  Neurologic:  negative for focal weakness, numbness, headache  Objective:   Blood pressure (!) 92/52, pulse 71, temperature 97.6 ??F (36.4 ??C), temperature source Oral, resp. rate 18, height _0  (1.626 m), weight 89 lb 14.4 oz (40.8 kg), SpO2 92 %.     Intake/Output Summary (Last 24 hours) at 06/02/2021 1012  Last data filed at 06/02/2021 7867  Gross per 24 hour   Intake 440 ml   Output 0 ml   Net 440 ml     Physical Exam:   Constitution:  the patient is well developed and in no acute distress  HEENT:  Sclera clear,  pupils equal, oral mucosa moist. Oral anatomy s/p surgical resesction.   Respiratory: Mild R>L rhonchi.   Cardiovascular:  RRR without M,G,R  Gastrointestinal: soft and non-tender; with positive bowel sounds.  Musculoskeletal: warm without cyanosis. There is no lower extremity edema.  Skin:  no jaundice or rashes, no wounds   Neurologic: no gross neuro deficits     Psychiatric:  alert and oriented x ppt    CXR:   06/02/21      LAB:  Recent Labs     06/01/21  1655 06/02/21  0728   WBC 7.6 8.0   HGB 8.5* 8.8*   HCT 27.1* 28.4*   PLT 244 260     Recent Labs     06/01/21  1655   NA 144   K 3.4*   CL 108*   CO2 32   BUN 18   CREATININE 0.60*   MG 2.0   PHOS 2.0*     No results for input(s): TROPHS, NTPROBNP, CRP, ESR in the last 72 hours.  Recent Labs     06/01/21  1655   GLUCOSE 122*      Microbiology:   Recent Labs     05/30/21  1350   CULTURE HEAVY * Methicillin Resistant Staphylococcus aureus **   MODERATE NORMAL RESPIRATORY FLORA   MRSA CALLED TO AND CORRECTLY REPEATED BY: KATIE,RN _1  6.25.22 SC     ECHO: No results found for this or any previous visit.    Assessment and Plan:  (Medical Decision Making)   Principal Problem:    Acute respiratory failure with hypoxemia ([place])  Plan: on 2L O2. Cont to try and wean as sats allow. May need home o2 which he is open to. Needs ambulating and resting RA sat check within 24 hours of discharge.     Pneumonia of both lower lobes due to infectious organism  Plan: s/p bronch 6/22. Culture with MRSA. On vanc and zosyn day 5. Complete 7 day course.    Emphysema lung ([place])  Plan: Currently on Trelegy 100, duoneb which is somewhat redundant. Will change duoneb to prn.     Tongue cancer ([place])  Plan: contributes to increased aspiration risk.     Aspiration pneumonia [facility])  Plan: s/p bronch 6/22. Culture with MRSA. Was on zosyn, now d/c'ed. On vanc day 5.         More than 50% of the time documented was spent in face-to-face contact with the patient and in the care of the patient on  the floor/unit where the patient is located.    Leverne Humbles,  MD

## 2021-06-02 NOTE — Progress Notes (Signed)
VANCO DAILY FOLLOW UP NOTE  [place] Pharmacokinetic Monitoring Service - Vancomycin    Consulting Provider: Dr. Doren Custard   Indication: HAP  Target Concentration: Goal AUC/MIC 400-600 mg*hr/L  Day of Therapy: 4  Additional Antimicrobials: Pip/Tazo    Pertinent Laboratory Values:   Wt Readings from Last 1 Encounters:   06/02/21 89 lb 14.4 oz (40.8 kg)     Temp Readings from Last 1 Encounters:   06/02/21 97.7 ??F (36.5 ??C) (Oral)     Recent Labs     06/01/21  1655   BUN 18   CREATININE 0.60*   WBC 7.6     Estimated Creatinine Clearance: 67 mL/min (A) (based on SCr of 0.6 mg/dL (L)).    Lab Results   Component Value Date    VANCORANDOM 12.6 06/01/2021       MRSA Nasal Swab: not ordered. Order placed by pharmacy..      Assessment:  Date/Time Dose Concentration AUC   6/23 0416 1000mg  q 18 hours 19.8 648   6/24 0928 1000 mg q 24 hours 12.6 427   Note: Serum concentrations collected for AUC dosing may appear elevated if collected in close proximity to the dose administered, this is not necessarily an indication of toxicity    Plan:  Current dosing regimen is therapeutic  Continue current dose  Repeat vancomycin concentrations will be ordered as clinically appropriate   Pharmacy will continue to monitor patient and adjust therapy as indicated    Thank you for the consult,  [place] Shelvy Heckert, [place]

## 2021-06-02 NOTE — Progress Notes (Signed)
Hospitalist Progress Note   Admit Date:  05/29/2021  6:36 PM   Name:  Caleb Robinson   Age:  70 y.o.  Sex:  male  DOB:  25-Jan-1951   MRN:  [id number]   Room:  212/01    Presenting Complaint: Fatigue     Reason(s) for Admission: Hypoxia [R09.02]  Acute respiratory failure with hypoxemia ([place]) [J96.01]  Pneumonia of both lower lobes due to infectious organism [J18.9]     Hospital Course & Interval History:   Please refer to the admission H&P for details of presentation.      In summary, Caleb Robinson is a 70 y.o. 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  Meets criteria for sepsis with leukocytosis and tachypnea.  Noted to be hypoxic at 88% on room air requiring 2 L of O2 nasal cannula.  Chest x-ray shows increased bibasilar infiltrate concerning for aspiration pneumonia.  Pulmonary was consulted and patient underwent bronchoscopy.  BAL showing MRSA.  Blood cultures are negative.     Subjective/24 hr Events (06/02/21) :  Patient is seen and examined at bedside.  No acute events reported overnight by nursing staff.  Sitting up in bed. Reports feeling well with 2L O2 NC. Wanting O2 for home when discharged as he gets short of breath on ambulation.  Has some cough. Patient denies fever, chills, chest pains, shortness of breath, n/v, abdominal pain.    Review of Systems: 10 point review of systems is otherwise negative with the exception of the elements mentioned above.        Assessment & Plan:     Acute respiratory failure with hypoxemia concerning for Aspiration pneumonia  Emphysema  MRSA Infection   Patient was noted to be hypoxic with saturation of 88% on room air in the emergency room with respiratory distress.  Respiratory viral  panel neg. S/p Bronch with BAL +MRSA  06/02/21: Continues to require O2 supplementation.   BAL +staph aureus, BCX (6/21 and 6/22) neg to date.  - pulmonary recs appreciated  - continue with Vancomycin for no. Day 5  - O2 supplement and wean as tolerated  - 6MWT ordered    Dysphagia   MBBS with severe oral dysphagia  - Appreciate SLP.  Risk for aspiration    Anemia  Likely of chronic disease given underlying malignancy and poor nutrition.  Hb of 9.8 on admission.  S/p 1 IV iron   06/02/21: Hb of 8.8  - monitor   - transfuse Hb to keep > 7    Right Arm swelling  Swelling around PICC line. No pain or redness. Doppler neg for DVT  - PICC consulted to eval and replace PICC line.    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 Planned for outpatient exchange in 2 weeks.    Anxiety and depression: Resume home Zoloft    Diet:  Diet NPO  ADULT TUBE FEEDING; PEG; Standard with Fiber; Bolus; 4 Times Daily; 240; Syringe Push; 100; Before and after each bolus  DVT PPx: lovenox  Code status: Full Code        I have reviewed and ordered  clinical lab tests and independently visualized images, tracing.   I spent 30 minutes of time caring for this patient at bedside or nearby, more than 50 percent of which was spent on coordination of care and/or counseling regarding the disease process, treatment options, and treatment plan.        Hospital Problems:  Principal Problem:    Acute respiratory failure with hypoxemia (HCC)  Active Problems:    Anemia    Pneumonia of both lower lobes due to infectious organism    MRSA (methicillin resistant Staphylococcus aureus) infection    Oral phase dysphagia    S/P glossectomy    Localized swelling of right upper extremity    Emphysema lung (HCC)    Tongue cancer (HCC)    Hyperlipidemia    Anxiety and depression    Aspiration pneumonia (HCC)    Severe protein-calorie  malnutrition ([place])  Resolved Problems:    * No resolved hospital problems. *      Objective:     Patient Vitals for the past 24 hrs:   Temp Pulse Resp BP SpO2   06/02/21 1127 98.7 ??F (37.1 ??C) 68 18 98/60 --   06/02/21 0758 97.6 ??F (36.4 ??C) 71 18 (!) 92/52 --   06/02/21 0739 -- 75 18 -- 92 %   06/02/21 0610 -- -- 17 -- --   06/02/21 0322 97.7 ??F (36.5 ??C) 76 18 114/60 90 %   06/01/21 2315 98.6 ??F (37 ??C) 69 18 (!) 99/54 92 %   06/01/21 2146 -- 75 18 -- 91 %   06/01/21 2037 -- -- 18 -- --   06/01/21 1908 97.9 ??F (36.6 ??C) 75 18 110/63 91 %   06/01/21 1624 97.7 ??F (36.5 ??C) 75 18 (!) 87/49 94 %   06/01/21 1531 -- 75 19 -- 94 %       Oxygen Therapy  SpO2: 92 %  Pulse via Oximetry: 62 beats per minute  Pulse Oximeter Device Mode: Intermittent  O2 Device: Nasal cannula  FiO2 : 28 %  O2 Flow Rate (L/min): 2 L/min    Estimated body mass index is 15.43 kg/m?? as calculated from the following:    Height as of this encounter: 5\' 4"  (1.626 m).    Weight as of this encounter: 89 lb 14.4 oz (40.8 kg).    Intake/Output Summary (Last 24 hours) at 06/02/2021 1457  Last data filed at 06/02/2021 1127  Gross per 24 hour   Intake 440 ml   Output 0 ml   Net 440 ml         Physical Exam:     Blood pressure 98/60, pulse 68, temperature 98.7 ??F (37.1 ??C), temperature source Oral, resp. rate 18, height 5\' 4"  (1.626 m), weight 89 lb 14.4 oz (40.8 kg), SpO2 92 %.     General:    Cachetic, chronically ill appearing     Head:  Normocephalic, atraumatic  Eyes:  Sclerae appear normal.  Pupils equally round.  ENT:  Nares appear normal, no drainage.  Moist oral mucosa  Neck:  No restricted ROM.  Trachea midline   CV:   RRR.  No m/r/g.  No jugular venous distension.  Lungs:   Coarse breath sounds, no wheezing  Abdomen:   Bowel sounds present.  Soft, nontender, nondistended. +PEG  Extremities: No cyanosis or clubbing.  No edema  Skin:     No rashes and normal coloration.   Warm and dry.    Neuro:  CN II-XII  grossly intact.  A&Ox3  Psych:  Normal mood  and affect.      I have reviewed ordered lab tests and independently visualized imaging below:    Recent Labs:  Recent Results (from the past 48 hour(s))   PLEASE READ & DOCUMENT PPD TEST IN 48 HRS    Collection Time: 06/01/21 12:00 AM   Result Value Ref Range    PPD, (POC) Negative Negative    mm Induration 0 0 - 5 mm   Vancomycin Level, Random    Collection Time: 06/01/21  9:28 AM   Result Value Ref Range    Vancomycin Rm 12.6 UG/ML   Basic Metabolic Panel    Collection Time: 06/01/21  4:55 PM   Result Value Ref Range    Sodium 144 138 - 145 mmol/L    Potassium 3.4 (L) 3.5 - 5.1 mmol/L    Chloride 108 (H) 98 - 107 mmol/L    CO2 32 21 - 32 mmol/L    Anion Gap 4 (L) 7 - 16 mmol/L    Glucose 122 (H) 65 - 100 mg/dL    BUN 18 8 - 23 MG/DL    CREATININE 0.60 (L) 0.8 - 1.5 MG/DL    GFR African American >60 >60 ml/min/1.72m2    GFR Non-African American >60 >60 ml/min/1.47m2    Calcium 8.6 8.3 - 10.4 MG/DL   Magnesium    Collection Time: 06/01/21  4:55 PM   Result Value Ref Range    Magnesium 2.0 1.8 - 2.4 mg/dL   Phosphorus    Collection Time: 06/01/21  4:55 PM   Result Value Ref Range    Phosphorus 2.0 (L) 2.3 - 3.7 MG/DL   CBC with Auto Differential    Collection Time: 06/01/21  4:55 PM   Result Value Ref Range    WBC 7.6 4.3 - 11.1 K/uL    RBC 2.81 (L) 4.23 - 5.6 M/uL    Hemoglobin 8.5 (L) 13.6 - 17.2 g/dL    Hematocrit 27.1 (L) 41.1 - 50.3 %    MCV 96.4 79.6 - 97.8 FL    MCH 30.2 26.1 - 32.9 PG    MCHC 31.4 31.4 - 35.0 g/dL    RDW 16.4 (H) 11.9 - 14.6 %    Platelets 244 150 - 450 K/uL    MPV 10.7 9.4 - 12.3 FL    nRBC 0.00 0.0 - 0.2 K/uL    Differential Type AUTOMATED      Seg Neutrophils 75 43 - 78 %    Lymphocytes 8 (L) 13 - 44 %    Monocytes 13 (H) 4.0 - 12.0 %    Eosinophils % 2 0.5 - 7.8 %    Basophils 1 0.0 - 2.0 %    Immature Granulocytes 1 0.0 - 5.0 %    Segs Absolute 5.8 1.7 - 8.2 K/UL    Absolute Lymph # 0.6 0.5 - 4.6 K/UL    Absolute Mono # 1.0 0.1 - 1.3 K/UL    Absolute Eos # 0.1 0.0 - 0.8 K/UL     Basophils Absolute 0.0 0.0 - 0.2 K/UL    Absolute Immature Granulocyte 0.1 0.0 - 0.5 K/UL   CBC with Auto Differential    Collection Time: 06/02/21  7:28 AM   Result Value Ref Range    WBC 8.0 4.3 - 11.1 K/uL    RBC 2.91 (L) 4.23 - 5.6 M/uL    Hemoglobin 8.8 (L) 13.6 - 17.2 g/dL    Hematocrit  28.4 (L) 41.1 - 50.3 %    MCV 97.6 79.6 - 97.8 FL    MCH 30.2 26.1 - 32.9 PG    MCHC 31.0 (L) 31.4 - 35.0 g/dL    RDW 16.6 (H) 11.9 - 14.6 %    Platelets 260 150 - 450 K/uL    MPV 10.8 9.4 - 12.3 FL    nRBC 0.00 0.0 - 0.2 K/uL    Differential Type AUTOMATED      Seg Neutrophils 77 43 - 78 %    Lymphocytes 9 (L) 13 - 44 %    Monocytes 10 4.0 - 12.0 %    Eosinophils % 2 0.5 - 7.8 %    Basophils 1 0.0 - 2.0 %    Immature Granulocytes 1 0.0 - 5.0 %    Segs Absolute 6.2 1.7 - 8.2 K/UL    Absolute Lymph # 0.7 0.5 - 4.6 K/UL    Absolute Mono # 0.8 0.1 - 1.3 K/UL    Absolute Eos # 0.2 0.0 - 0.8 K/UL    Basophils Absolute 0.1 0.0 - 0.2 K/UL    Absolute Immature Granulocyte 0.1 0.0 - 0.5 K/UL   Basic Metabolic Panel w/ Reflex to MG    Collection Time: 06/02/21 11:36 AM   Result Value Ref Range    Sodium 143 138 - 145 mmol/L    Potassium 3.5 3.5 - 5.1 mmol/L    Chloride 106 98 - 107 mmol/L    CO2 31 21 - 32 mmol/L    Anion Gap 6 (L) 7 - 16 mmol/L    Glucose 102 (H) 65 - 100 mg/dL    BUN 15 8 - 23 MG/DL    CREATININE 0.60 (L) 0.8 - 1.5 MG/DL    GFR African American >60 >60 ml/min/1.5m2    GFR Non-African American >60 >60 ml/min/1.84m2    Calcium 9.0 8.3 - 10.4 MG/DL   Phosphorus    Collection Time: 06/02/21 11:36 AM   Result Value Ref Range    Phosphorus 2.2 (L) 2.3 - 3.7 MG/DL   Magnesium    Collection Time: 06/02/21 11:36 AM   Result Value Ref Range    Magnesium 1.8 1.8 - 2.4 mg/dL       Other Studies:  XR CHEST 1 VIEW   Final Result   1. Unchanged bibasilar lung infiltrates.   2. New small bilateral pleural effusions.      Vascular duplex upper extremity venous right   Final Result   No sonographic evidence of DVT in right upper  extremity.             FL MODIFIED BARIUM SWALLOW W VIDEO   Final Result   Normal swallowing evaluation with thin barium liquids administered   via syringe. For more detailed evaluation of the swallowing study please refer   to the separately dictated speech pathologist report         XR CHEST PORTABLE   Final Result      1. Findings as described above.       621      XR CHEST (2 VW)   Final Result   Increased bibasilar infiltrate             Current Meds:  Current Facility-Administered Medications   Medication Dose Route Frequency   ??? ipratropium-albuterol (DUONEB) nebulizer solution 3 mL  3 mL Inhalation Q4H PRN   ??? vancomycin (VANCOCIN) 1,000 mg in sodium chloride 0.9 % 250 mL IVPB (Vial2Bag)  1,000 mg IntraVENous Q24H   ??? alcohol 62% (NOZIN) nasal sanitizer 1 ampule  1 ampule Topical Q12H   ??? albuterol sulfate HFA (PROVENTIL;VENTOLIN;PROAIR) 108 (90 Base) MCG/ACT inhaler 2 puff  2 puff Inhalation Q6H PRN   ??? chlorhexidine (PERIDEX) 0.12 % solution 15 mL  15 mL Mouth/Throat 4x Daily   ??? diphenoxylate-atropine (LOMOTIL) 2.5-0.025 MG per tablet 1 tablet  1 tablet Oral 4x Daily PRN   ??? fluticasone (FLONASE) 50 MCG/ACT nasal spray 2 spray  2 spray Nasal Daily   ??? fluticasone-umeclidin-vilant (TRELEGY ELLIPTA) 100-62.5-25 MCG/INH inhaler 1 puff (Patient Supplied)  1 puff Inhalation Daily   ??? gabapentin (NEURONTIN) capsule 200 mg  200 mg Oral TID   ??? LORazepam (ATIVAN) tablet 0.5 mg  0.5 mg Oral Daily   ??? oxyCODONE (ROXICODONE) immediate release tablet 10 mg  10 mg Oral Q6H PRN   ??? sertraline (ZOLOFT) 20 MG/ML concentrated solution 50 mg (Patient Supplied)  50 mg Oral Daily   ??? traZODone (DESYREL) tablet 50 mg  50 mg Oral Nightly PRN   ??? Vitamin D (CHOLECALCIFEROL) tablet 1,000 Units  1,000 Int'l Units Oral Daily   ??? sodium chloride flush 0.9 % injection 5-40 mL  5-40 mL IntraVENous 2 times per day   ??? sodium chloride flush 0.9 % injection 5-40 mL  5-40 mL IntraVENous PRN   ??? 0.9 % sodium chloride infusion    IntraVENous PRN   ??? enoxaparin Sodium (LOVENOX) injection 30 mg  30 mg SubCUTAneous Q24H   ??? ondansetron (ZOFRAN-ODT) disintegrating tablet 4 mg  4 mg Oral Q8H PRN    Or   ??? ondansetron (ZOFRAN) injection 4 mg  4 mg IntraVENous Q6H PRN   ??? polyethylene glycol (GLYCOLAX) packet 17 g  17 g Oral Daily PRN   ??? acetaminophen (TYLENOL) tablet 650 mg  650 mg Oral Q6H PRN    Or   ??? acetaminophen (TYLENOL) suppository 650 mg  650 mg Rectal Q6H PRN   ??? nicotine (NICODERM CQ) 21 MG/24HR 1 patch  1 patch TransDERmal Daily   ??? magnesium sulfate 2000 mg in 50 mL IVPB premix  2,000 mg IntraVENous PRN   ??? lidocaine viscous hcl (XYLOCAINE) 2 % solution 15 mL  15 mL Mouth/Throat Q3H PRN       Signed:  Helmut Muster, MD    Part of this note may have been written by using a voice dictation software.  The note has been proof read but may still contain some grammatical/other typographical errors.

## 2021-06-03 LAB — BASIC METABOLIC PANEL W/ REFLEX TO MG FOR LOW K
Anion Gap: 4 mmol/L — ABNORMAL LOW (ref 7–16)
BUN: 14 MG/DL (ref 8–23)
CO2: 30 mmol/L (ref 21–32)
Calcium: 8.7 MG/DL (ref 8.3–10.4)
Chloride: 107 mmol/L (ref 98–107)
Creatinine: 0.7 MG/DL — ABNORMAL LOW (ref 0.8–1.5)
GFR African American: >60 mL/min/{1.73_m2} (ref >60)
GFR Non-African American: >60 mL/min/{1.73_m2} (ref >60)
Glucose: 72 mg/dL (ref 65–100)
Potassium: 3.9 mmol/L (ref 3.5–5.1)
Sodium: 141 mmol/L (ref 136–145)

## 2021-06-03 LAB — CULTURE, BLOOD 1
Culture: NO GROWTH
Culture: NO GROWTH

## 2021-06-03 LAB — PHOSPHORUS: Phosphorus: 2.6 MG/DL (ref 2.3–3.7)

## 2021-06-03 MED ORDER — PIPERACILLIN SOD-TAZOBACTAM SO 4.5 (4-0.5) G IV SOLR
4.54-0.5 (4-0.5) g | Freq: Once | INTRAVENOUS | Status: AC
Start: 2021-06-03 — End: 2021-06-03
  Administered 2021-06-03: 17:00:00 4500 mg via INTRAVENOUS

## 2021-06-03 MED ORDER — PIPERACILLIN SOD-TAZOBACTAM SO 3.375 (3-0.375) G IV SOLR
3.3753-0.375 (3-0.375) g | Freq: Three times a day (TID) | INTRAVENOUS | Status: DC
Start: 2021-06-03 — End: 2021-06-04
  Administered 2021-06-04 (×2): 3375 mg via INTRAVENOUS

## 2021-06-03 MED ORDER — PIPERACILLIN SOD-TAZOBACTAM SO 3.375 (3-0.375) G IV SOLR
3.375 (3-0.375) g | Freq: Four times a day (QID) | INTRAVENOUS | Status: DC
Start: 2021-06-03 — End: 2021-06-03

## 2021-06-03 MED FILL — CHLORHEXIDINE GLUCONATE 0.12 % MT SOLN: 0.12 % | OROMUCOSAL | Qty: 15

## 2021-06-03 MED FILL — PIPERACILLIN SOD-TAZOBACTAM SO 4.5 (4-0.5) G IV SOLR: 4.5 (4-0.5) g | INTRAVENOUS | Qty: 4500

## 2021-06-03 MED FILL — NICOTINE STEP 1 21 MG/24HR TD PT24: 21 mg/(24.h) | TRANSDERMAL | Qty: 1

## 2021-06-03 MED FILL — ENOXAPARIN SODIUM 30 MG/0.3ML IJ SOSY: 30 MG/0.3ML | INTRAMUSCULAR | Qty: 0.3

## 2021-06-03 MED FILL — LORAZEPAM 0.5 MG PO TABS: 0.5 mg | ORAL | Qty: 1

## 2021-06-03 MED FILL — GABAPENTIN 100 MG PO CAPS: 100 mg | ORAL | Qty: 2

## 2021-06-03 MED FILL — NOZIN NASAL SANITIZER 62 % NA KIT: 62 % | NASAL | Qty: 1

## 2021-06-03 MED FILL — LIDOCAINE VISCOUS HCL 2 % MT SOLN: 2 % | OROMUCOSAL | Qty: 15

## 2021-06-03 MED FILL — VITAMIN D3 25 MCG (1000 UT) PO TABS: 25 MCG (1000 UT) | ORAL | Qty: 1

## 2021-06-03 MED FILL — OXYCODONE HCL 5 MG PO TABS: 5 mg | ORAL | Qty: 2

## 2021-06-03 MED FILL — VANCOMYCIN HCL 1 G IV SOLR: 1 g | INTRAVENOUS | Qty: 1000

## 2021-06-03 NOTE — Progress Notes (Signed)
VANCO DAILY FOLLOW UP NOTE  [facility] Pharmacokinetic Monitoring Service - Vancomycin    Consulting Provider: Dr. Doren Custard   Indication: HAP  Target Concentration: Goal AUC/MIC 400-600 mg*hr/L  Day of Therapy: 5/14  Additional Antimicrobials: Pip/Tazo    Pertinent Laboratory Values:   Wt Readings from Last 1 Encounters:   06/03/21 84 lb 12.8 oz (38.5 kg)     Temp Readings from Last 1 Encounters:   06/03/21 97.4 ??F (36.3 ??C) (Oral)     Recent Labs     06/01/21  1655 06/02/21  0728 06/02/21  1136 06/03/21  0636   BUN 18  --  15 14   CREATININE 0.60*  --  0.60* 0.70*   WBC 7.6 8.0  --   --      Estimated Creatinine Clearance: 54 mL/min (A) (based on SCr of 0.7 mg/dL (L)).    Lab Results   Component Value Date    VANCORANDOM 12.6 06/01/2021       MRSA Nasal Swab: not ordered. Order placed by pharmacy..      Assessment:  Date/Time Dose Concentration AUC   6/23 0416 1000mg  q 18 hours 19.8 648   6/24 0928 1000 mg q 24 hours 12.6 427   Note: Serum concentrations collected for AUC dosing may appear elevated if collected in close proximity to the dose administered, this is not necessarily an indication of toxicity    Plan:  Current dosing regimen is therapeutic  Continue current dose  Repeat vancomycin concentrations will be ordered as clinically appropriate   Pharmacy will continue to monitor patient and adjust therapy as indicated    Thank you for the consult,  [place]el[place] Cairo Sohum Delillo, [place]

## 2021-06-03 NOTE — Progress Notes (Signed)
06/03/21 0930   Resting (Room Air)   SpO2 86   HR 72   Resting (On O2)   SpO2 94   HR 72   O2 Device Nasal cannula   O2 Flow Rate (l/min) 1 l/min   FIO2 (%) 24   During Walk (On O2)   SpO2 96   HR 77   O2 Device Nasal cannula   O2 Flow Rate (l/min) 1 l/min   After Walk   SpO2 96   HR 77   O2 Device Nasal cannula   O2 Flow Rate (l/min) 1 l/min   FIO2 (%) 24   Rate of Dyspnea 1   Symptoms Shortness of breath   Does the Patient Qualify for Home O2 Yes   Liter Flow at Rest 1   Liter Flow on Exertion 1   Does the Patient Need Portable Oxygen Tanks Yes

## 2021-06-03 NOTE — Progress Notes (Signed)
Reviewed notes for new spiritual concerns        Care progressing towards goals        Family support noted        Faith listed upon admission        Pt demonstrates confidence in care team          Code status is listed        Will follow as needed

## 2021-06-03 NOTE — Progress Notes (Signed)
Caleb Robinson  Admission Date: 05/29/2021         Daily Progress Note: 06/03/2021    The patient's chart is reviewed and the patient is discussed with the staff.    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 Pt was recently hospitalized 5/6-04/16/21 for acute on chronic respiratory failure and aspiration pneumonia. He was seen by his PCP on 6/21 and found to have a fever and diarrhea. Pt was admitted by the hospitalist service and we were consulted for therapeutic bronch.??Pt underwent bronch 6/22 with minimal secretions. Sputum cx with heavy MRSA.??    Subjective:     Patient currently on 0.5L o2. No issues overnight. No new complaints today.       Current Facility-Administered Medications   Medication Dose Route Frequency   ??? ipratropium-albuterol (DUONEB) nebulizer solution 3 mL  3 mL Inhalation Q4H PRN   ??? vancomycin (VANCOCIN) 1,000 mg in sodium chloride 0.9 % 250 mL IVPB (Vial2Bag)  1,000 mg IntraVENous Q24H   ??? alcohol 62% (NOZIN) nasal sanitizer 1 ampule  1 ampule Topical Q12H   ??? albuterol sulfate HFA (PROVENTIL;VENTOLIN;PROAIR) 108 (90 Base) MCG/ACT inhaler 2 puff  2 puff Inhalation Q6H PRN   ??? chlorhexidine (PERIDEX) 0.12 % solution 15 mL  15 mL Mouth/Throat 4x Daily   ??? diphenoxylate-atropine (LOMOTIL) 2.5-0.025 MG per tablet 1 tablet  1 tablet Oral 4x Daily PRN   ??? fluticasone (FLONASE) 50 MCG/ACT nasal spray 2 spray  2 spray Nasal Daily   ??? fluticasone-umeclidin-vilant (TRELEGY ELLIPTA) 100-62.5-25 MCG/INH inhaler 1 puff (Patient Supplied)  1 puff Inhalation Daily   ??? gabapentin (NEURONTIN) capsule 200 mg  200 mg Oral TID   ??? LORazepam (ATIVAN) tablet 0.5 mg  0.5 mg Oral Daily   ??? oxyCODONE (ROXICODONE) immediate release tablet 10 mg  10 mg Oral Q6H PRN   ??? sertraline (ZOLOFT) 20 MG/ML concentrated  solution 50 mg (Patient Supplied)  50 mg Oral Daily   ??? traZODone (DESYREL) tablet 50 mg  50 mg Oral Nightly PRN   ??? Vitamin D (CHOLECALCIFEROL) tablet 1,000 Units  1,000 Int'l Units Oral Daily   ??? sodium chloride flush 0.9 % injection 5-40 mL  5-40 mL IntraVENous 2 times per day   ??? sodium chloride flush 0.9 % injection 5-40 mL  5-40 mL IntraVENous PRN   ??? 0.9 % sodium chloride infusion   IntraVENous PRN   ??? enoxaparin Sodium (LOVENOX) injection 30 mg  30 mg SubCUTAneous Q24H   ??? ondansetron (ZOFRAN-ODT) disintegrating tablet 4 mg  4 mg Oral Q8H PRN    Or   ??? ondansetron (ZOFRAN) injection 4 mg  4 mg IntraVENous Q6H PRN   ??? polyethylene glycol (GLYCOLAX) packet 17 g  17 g Oral Daily PRN   ??? acetaminophen (TYLENOL) tablet 650 mg  650 mg Oral Q6H PRN    Or   ??? acetaminophen (TYLENOL) suppository 650 mg  650 mg Rectal Q6H PRN   ??? nicotine (NICODERM CQ) 21 MG/24HR 1 patch  1 patch TransDERmal Daily   ??? magnesium sulfate 2000 mg in 50 mL IVPB premix  2,000 mg IntraVENous PRN   ??? lidocaine viscous hcl (XYLOCAINE) 2 % solution 15 mL  15 mL Mouth/Throat Q3H PRN     Review of Systems  Constitutional: negative for fever, chills, sweats  Cardiovascular: negative for chest pain, palpitations, syncope, edema  Gastrointestinal:  negative for dysphagia, reflux, vomiting, diarrhea, abdominal pain, or melena  Neurologic:  negative for focal weakness, numbness, headache  Objective:   Blood pressure 107/67, pulse 70, temperature 97.7 ??F (36.5 ??C), temperature source Oral, resp. rate 18, height 5' 4"  (1.626 m), weight 84 lb 12.8 oz (38.5 kg), SpO2 94 %.     Intake/Output Summary (Last 24 hours) at 06/03/2021 1131  Last data filed at 06/03/2021 0825  Gross per 24 hour   Intake 540 ml   Output 150 ml   Net 390 ml     Physical Exam:   Constitution:  the patient is well developed and in no acute distress  HEENT:  Sclera clear, pupils equal, oral mucosa moist. Oral anatomy s/p surgical resesction.   Respiratory: Mild R>L rhonchi.    Cardiovascular:  RRR without M,G,R  Gastrointestinal: soft and non-tender; with positive bowel sounds.  Musculoskeletal: warm without cyanosis. There is no lower extremity edema.  Skin:  no jaundice or rashes, no wounds   Neurologic: no gross neuro deficits     Psychiatric:  alert and oriented x ppt    CXR:   06/02/21      LAB:  Recent Labs     06/01/21  1655 06/02/21  0728   WBC 7.6 8.0   HGB 8.5* 8.8*   HCT 27.1* 28.4*   PLT 244 260     Recent Labs     06/01/21  1655 06/02/21  1136 06/03/21  0636   NA 144 143 141   K 3.4* 3.5 3.9   CL 108* 106 107   CO2 32 31 30   BUN 18 15 14    CREATININE 0.60* 0.60* 0.70*   MG 2.0 1.8  --    PHOS 2.0* 2.2* 2.6     No results for input(s): TROPHS, NTPROBNP, CRP, ESR in the last 72 hours.  Recent Labs     06/01/21  1655 06/02/21  1136 06/03/21  0636   GLUCOSE 122* 102* 72      Microbiology:   No results for input(s): CULTURE in the last 72 hours.  ECHO: No results found for this or any previous visit.    Assessment and Plan:  (Medical Decision Making)   Principal Problem:    Acute respiratory failure with hypoxemia ([place])  Plan: on 0.5L O2. May be able to wean off prior to discharge but if not will need walking O2 sat check to qualify for home O2. within 24 hours of discharge.     Pneumonia of both lower lobes due to infectious organism  Plan: s/p bronch 6/22. Culture with MRSA. On vanc and zosyn day 6. Complete 7 day course.    Emphysema lung (HCC)  Plan: Currently on Trelegy 100, duoneb prn    Tongue cancer ([place])  Plan: contributes to increased aspiration risk.     Aspiration pneumonia [facility])  Plan: s/p bronch 6/22. Culture with MRSA. Was on zosyn, now d/c'ed. On vanc day 5.     No additional pulmonary input at this time. We will sign off but please let us know if new issues arise.           More than 50% of the time documented was spent in face-to-face contact with the patient and in the care of the patient on the floor/unit where the patient is located.    Leverne Humbles,  MD

## 2021-06-03 NOTE — Plan of Care (Signed)
Problem: Respiratory - Adult  Goal: Achieves optimal ventilation and oxygenation  06/03/2021 1151 by Illene Labrador, RCP  Outcome: Progressing  Flowsheets (Taken 06/03/2021 1151)  Achieves optimal ventilation and oxygenation:   Assess for changes in respiratory status   Respiratory therapy support as indicated   Assess for changes in mentation and behavior   Oxygen supplementation based on oxygen saturation or arterial blood gases   Encourage broncho-pulmonary hygiene including cough, deep breathe, incentive spirometry   Assess and instruct to report shortness of breath or any respiratory difficulty  06/03/2021 1150 by Illene Labrador, RCP  Flowsheets (Taken 06/03/2021 1150)  Achieves optimal ventilation and oxygenation:   Assess for changes in respiratory status   Respiratory therapy support as indicated   Assess for changes in mentation and behavior   Oxygen supplementation based on oxygen saturation or arterial blood gases   Encourage broncho-pulmonary hygiene including cough, deep breathe, incentive spirometry   Assess and instruct to report shortness of breath or any respiratory difficulty

## 2021-06-03 NOTE — Progress Notes (Signed)
Patient walking hall with staff    Calm    Family present

## 2021-06-03 NOTE — Progress Notes (Signed)
Hospitalist Progress Note   Admit Date:  05/29/2021  6:36 PM   Name:  Caleb Robinson   Age:  70 y.o.  Sex:  male  DOB:  1951/10/28   MRN:  [id number]   Room:  212/01    Presenting Complaint: Fatigue     Reason(s) for Admission: Hypoxia [R09.02]  Acute respiratory failure with hypoxemia ([place]) [J96.01]  Pneumonia of both lower lobes due to infectious organism [J18.9]     Hospital Course & Interval History:   Please refer to the admission H&P for details of presentation.      In summary, Caleb Robinson is a 70 y.o. 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  Meets criteria for sepsis with leukocytosis and tachypnea.  Noted to be hypoxic at 88% on room air requiring 2 L of O2 nasal cannula.  Chest x-ray shows increased bibasilar infiltrate concerning for aspiration pneumonia.  Pulmonary was consulted and patient underwent bronchoscopy.  BAL showing MRSA.  Blood cultures are negative.     Subjective/24 hr Events (06/03/21) :  Patient is seen and examined at bedside.  No acute events reported overnight by nursing staff.  Continues to be on 1-2L O2 at rest. Pending 36mwt.  PICC like evaluated by RN and PICC team and appears to be working well.  No acute complaints.  Patient denies fever, chills, chest pains, shortness of breath, n/v, abdominal pain.    Review of Systems: 10 point review of systems is otherwise negative with the exception of the elements mentioned above.        Assessment & Plan:     Acute respiratory failure with hypoxemia concerning for Aspiration pneumonia  Emphysema  MRSA Infection   Patient was noted to be hypoxic with saturation of 88% on room air in the emergency room with respiratory distress.  Respiratory viral panel  neg. S/p Bronch with BAL +MRSA  06/03/21: Continues to require O2 supplementation.   BAL +staph aureus, BCX (6/21 and 6/22) neg to date.  - pulmonary recs appreciated  - continue with Vancomycin + zosyn for no. Day 6  - O2 supplement and wean as tolerated  - 6MWT ordered    Dysphagia   MBBS with severe oral dysphagia  - Appreciate SLP.  Risk for aspiration    Anemia  Likely of chronic disease given underlying malignancy and poor nutrition.  Hb of 9.8 on admission.  S/p 1 IV iron   06/03/21: Hb of 8.8  - monitor   - transfuse Hb to keep > 7    Right Arm swelling  Swelling around PICC line. No pain or redness. 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 Planned for outpatient exchange in 2 weeks.    Anxiety and depression: Resume home Zoloft    Diet:  Diet NPO  ADULT TUBE FEEDING; PEG; Standard with Fiber; Bolus; 4 Times Daily; 240; Syringe Push; 100; Before and after each bolus  DVT PPx: lovenox  Code status: Full Code      DISPO: tomorrow after completion of Vancomycin  Hospital Problems:  Principal Problem:    Acute respiratory failure with hypoxemia (HCC)  Active Problems:    Anemia    Pneumonia of both lower lobes due to infectious organism    MRSA (methicillin resistant Staphylococcus aureus) infection    Oral phase dysphagia    S/P glossectomy    Localized swelling of right upper extremity    Emphysema lung (HCC)    Tongue cancer (HCC)    Hyperlipidemia    Anxiety and depression    Aspiration pneumonia (HCC)    Severe protein-calorie malnutrition ([place])  Resolved Problems:    * No resolved hospital problems. *      Objective:     Patient Vitals for the past 24 hrs:   Temp Pulse Resp BP SpO2   06/03/21 1113 97.7 ??F (36.5 ??C) 70 18 107/67 94 %   06/03/21 0711 97.4 ??F (36.3 ??C) 70 18 (!) 92/54 99 %   06/03/21 0436 98.1 ??F (36.7 ??C) 76  18 (!) 93/49 96 %   06/03/21 0015 98.1 ??F (36.7 ??C) 70 18 (!) 113/52 96 %   06/02/21 2359 -- -- 18 -- --   06/02/21 1940 97.9 ??F (36.6 ??C) 71 18 (!) 106/53 97 %   06/02/21 1524 97.4 ??F (36.3 ??C) 94 18 (!) 98/55 --       Oxygen Therapy  SpO2: 94 %  Pulse via Oximetry: 62 beats per minute  Pulse Oximeter Device Mode: Intermittent  O2 Device: Nasal cannula  FiO2 : 28 %  O2 Flow Rate (L/min): 2 L/min    Estimated body mass index is 14.56 kg/m?? as calculated from the following:    Height as of this encounter: 5\' 4"  (1.626 m).    Weight as of this encounter: 84 lb 12.8 oz (38.5 kg).    Intake/Output Summary (Last 24 hours) at 06/03/2021 1217  Last data filed at 06/03/2021 0825  Gross per 24 hour   Intake 540 ml   Output 150 ml   Net 390 ml         Physical Exam:     Blood pressure 107/67, pulse 70, temperature 97.7 ??F (36.5 ??C), temperature source Oral, resp. rate 18, height 5\' 4"  (1.626 m), weight 84 lb 12.8 oz (38.5 kg), SpO2 94 %.     General:    Cachetic, chronically ill appearing     Head:  Normocephalic, atraumatic  Eyes:  Sclerae appear normal.  Pupils equally round.  ENT:  Nares appear normal, no drainage.  Moist oral mucosa  Neck:  No restricted ROM.  Trachea midline   CV:   RRR.  No m/r/g.  No jugular venous distension.  Lungs:   Coarse breath sounds, no wheezing  Abdomen:   Bowel sounds present.  Soft, nontender, nondistended. +PEG  Extremities: No cyanosis or clubbing.  No edema  Skin:     No rashes and normal coloration.   Warm and dry.    Neuro:  CN II-XII grossly intact.  A&Ox3  Psych:  Normal mood and affect.      I have reviewed ordered lab tests and independently visualized imaging below:    Recent Labs:  Recent Results (from the past 48 hour(s))   Basic Metabolic Panel    Collection Time: 06/01/21  4:55 PM   Result Value Ref Range    Sodium 144 138 - 145 mmol/L    Potassium 3.4 (L) 3.5 - 5.1 mmol/L    Chloride 108 (H) 98 - 107 mmol/L  CO2 32 21 - 32 mmol/L    Anion Gap 4 (L) 7 - 16 mmol/L    Glucose 122  (H) 65 - 100 mg/dL    BUN 18 8 - 23 MG/DL    CREATININE 0.60 (L) 0.8 - 1.5 MG/DL    GFR African American >60 >60 ml/min/1.26m2    GFR Non-African American >60 >60 ml/min/1.53m2    Calcium 8.6 8.3 - 10.4 MG/DL   Magnesium    Collection Time: 06/01/21  4:55 PM   Result Value Ref Range    Magnesium 2.0 1.8 - 2.4 mg/dL   Phosphorus    Collection Time: 06/01/21  4:55 PM   Result Value Ref Range    Phosphorus 2.0 (L) 2.3 - 3.7 MG/DL   CBC with Auto Differential    Collection Time: 06/01/21  4:55 PM   Result Value Ref Range    WBC 7.6 4.3 - 11.1 K/uL    RBC 2.81 (L) 4.23 - 5.6 M/uL    Hemoglobin 8.5 (L) 13.6 - 17.2 g/dL    Hematocrit 27.1 (L) 41.1 - 50.3 %    MCV 96.4 79.6 - 97.8 FL    MCH 30.2 26.1 - 32.9 PG    MCHC 31.4 31.4 - 35.0 g/dL    RDW 16.4 (H) 11.9 - 14.6 %    Platelets 244 150 - 450 K/uL    MPV 10.7 9.4 - 12.3 FL    nRBC 0.00 0.0 - 0.2 K/uL    Differential Type AUTOMATED      Seg Neutrophils 75 43 - 78 %    Lymphocytes 8 (L) 13 - 44 %    Monocytes 13 (H) 4.0 - 12.0 %    Eosinophils % 2 0.5 - 7.8 %    Basophils 1 0.0 - 2.0 %    Immature Granulocytes 1 0.0 - 5.0 %    Segs Absolute 5.8 1.7 - 8.2 K/UL    Absolute Lymph # 0.6 0.5 - 4.6 K/UL    Absolute Mono # 1.0 0.1 - 1.3 K/UL    Absolute Eos # 0.1 0.0 - 0.8 K/UL    Basophils Absolute 0.0 0.0 - 0.2 K/UL    Absolute Immature Granulocyte 0.1 0.0 - 0.5 K/UL   CBC with Auto Differential    Collection Time: 06/02/21  7:28 AM   Result Value Ref Range    WBC 8.0 4.3 - 11.1 K/uL    RBC 2.91 (L) 4.23 - 5.6 M/uL    Hemoglobin 8.8 (L) 13.6 - 17.2 g/dL    Hematocrit 28.4 (L) 41.1 - 50.3 %    MCV 97.6 79.6 - 97.8 FL    MCH 30.2 26.1 - 32.9 PG    MCHC 31.0 (L) 31.4 - 35.0 g/dL    RDW 16.6 (H) 11.9 - 14.6 %    Platelets 260 150 - 450 K/uL    MPV 10.8 9.4 - 12.3 FL    nRBC 0.00 0.0 - 0.2 K/uL    Differential Type AUTOMATED      Seg Neutrophils 77 43 - 78 %    Lymphocytes 9 (L) 13 - 44 %    Monocytes 10 4.0 - 12.0 %    Eosinophils % 2 0.5 - 7.8 %    Basophils 1 0.0 - 2.0 %     Immature Granulocytes 1 0.0 - 5.0 %    Segs Absolute 6.2 1.7 - 8.2 K/UL    Absolute Lymph # 0.7 0.5 - 4.6 K/UL  Absolute Mono # 0.8 0.1 - 1.3 K/UL    Absolute Eos # 0.2 0.0 - 0.8 K/UL    Basophils Absolute 0.1 0.0 - 0.2 K/UL    Absolute Immature Granulocyte 0.1 0.0 - 0.5 K/UL   PLEASE READ & DOCUMENT PPD TEST IN 72 HRS    Collection Time: 06/02/21 10:08 AM   Result Value Ref Range    PPD, (POC) Negative Negative    mm Induration 0 0 - 5 mm   Basic Metabolic Panel w/ Reflex to MG    Collection Time: 06/02/21 11:36 AM   Result Value Ref Range    Sodium 143 138 - 145 mmol/L    Potassium 3.5 3.5 - 5.1 mmol/L    Chloride 106 98 - 107 mmol/L    CO2 31 21 - 32 mmol/L    Anion Gap 6 (L) 7 - 16 mmol/L    Glucose 102 (H) 65 - 100 mg/dL    BUN 15 8 - 23 MG/DL    CREATININE 0.60 (L) 0.8 - 1.5 MG/DL    GFR African American >60 >60 ml/min/1.69m2    GFR Non-African American >60 >60 ml/min/1.32m2    Calcium 9.0 8.3 - 10.4 MG/DL   Phosphorus    Collection Time: 06/02/21 11:36 AM   Result Value Ref Range    Phosphorus 2.2 (L) 2.3 - 3.7 MG/DL   Magnesium    Collection Time: 06/02/21 11:36 AM   Result Value Ref Range    Magnesium 1.8 1.8 - 2.4 mg/dL   Basic Metabolic Panel w/ Reflex to MG    Collection Time: 06/03/21  6:36 AM   Result Value Ref Range    Sodium 141 136 - 145 mmol/L    Potassium 3.9 3.5 - 5.1 mmol/L    Chloride 107 98 - 107 mmol/L    CO2 30 21 - 32 mmol/L    Anion Gap 4 (L) 7 - 16 mmol/L    Glucose 72 65 - 100 mg/dL    BUN 14 8 - 23 MG/DL    CREATININE 0.70 (L) 0.8 - 1.5 MG/DL    GFR African American >60 >60 ml/min/1.4m2    GFR Non-African American >60 >60 ml/min/1.24m2    Calcium 8.7 8.3 - 10.4 MG/DL   Phosphorus    Collection Time: 06/03/21  6:36 AM   Result Value Ref Range    Phosphorus 2.6 2.3 - 3.7 MG/DL       Other Studies:  XR CHEST 1 VIEW   Final Result   1. Unchanged bibasilar lung infiltrates.   2. New small bilateral pleural effusions.      Vascular duplex upper extremity venous right   Final Result   No  sonographic evidence of DVT in right upper extremity.             FL MODIFIED BARIUM SWALLOW W VIDEO   Final Result   Normal swallowing evaluation with thin barium liquids administered   via syringe. For more detailed evaluation of the swallowing study please refer   to the separately dictated speech pathologist report         XR CHEST PORTABLE   Final Result      1. Findings as described above.       621      XR CHEST (2 VW)   Final Result   Increased bibasilar infiltrate             Current Meds:  Current Facility-Administered Medications   Medication Dose  Route Frequency   ??? piperacillin-tazobactam (ZOSYN) 3,375 mg in sodium chloride 0.9 % 50 mL IVPB (mini-bag)  3,375 mg IntraVENous Q6H   ??? ipratropium-albuterol (DUONEB) nebulizer solution 3 mL  3 mL Inhalation Q4H PRN   ??? vancomycin (VANCOCIN) 1,000 mg in sodium chloride 0.9 % 250 mL IVPB (Vial2Bag)  1,000 mg IntraVENous Q24H   ??? alcohol 62% (NOZIN) nasal sanitizer 1 ampule  1 ampule Topical Q12H   ??? albuterol sulfate HFA (PROVENTIL;VENTOLIN;PROAIR) 108 (90 Base) MCG/ACT inhaler 2 puff  2 puff Inhalation Q6H PRN   ??? chlorhexidine (PERIDEX) 0.12 % solution 15 mL  15 mL Mouth/Throat 4x Daily   ??? diphenoxylate-atropine (LOMOTIL) 2.5-0.025 MG per tablet 1 tablet  1 tablet Oral 4x Daily PRN   ??? fluticasone (FLONASE) 50 MCG/ACT nasal spray 2 spray  2 spray Nasal Daily   ??? fluticasone-umeclidin-vilant (TRELEGY ELLIPTA) 100-62.5-25 MCG/INH inhaler 1 puff (Patient Supplied)  1 puff Inhalation Daily   ??? gabapentin (NEURONTIN) capsule 200 mg  200 mg Oral TID   ??? LORazepam (ATIVAN) tablet 0.5 mg  0.5 mg Oral Daily   ??? oxyCODONE (ROXICODONE) immediate release tablet 10 mg  10 mg Oral Q6H PRN   ??? sertraline (ZOLOFT) 20 MG/ML concentrated solution 50 mg (Patient Supplied)  50 mg Oral Daily   ??? traZODone (DESYREL) tablet 50 mg  50 mg Oral Nightly PRN   ??? Vitamin D (CHOLECALCIFEROL) tablet 1,000 Units  1,000 Int'l Units Oral Daily   ??? sodium chloride flush 0.9 % injection 5-40  mL  5-40 mL IntraVENous 2 times per day   ??? sodium chloride flush 0.9 % injection 5-40 mL  5-40 mL IntraVENous PRN   ??? 0.9 % sodium chloride infusion   IntraVENous PRN   ??? enoxaparin Sodium (LOVENOX) injection 30 mg  30 mg SubCUTAneous Q24H   ??? ondansetron (ZOFRAN-ODT) disintegrating tablet 4 mg  4 mg Oral Q8H PRN    Or   ??? ondansetron (ZOFRAN) injection 4 mg  4 mg IntraVENous Q6H PRN   ??? polyethylene glycol (GLYCOLAX) packet 17 g  17 g Oral Daily PRN   ??? acetaminophen (TYLENOL) tablet 650 mg  650 mg Oral Q6H PRN    Or   ??? acetaminophen (TYLENOL) suppository 650 mg  650 mg Rectal Q6H PRN   ??? nicotine (NICODERM CQ) 21 MG/24HR 1 patch  1 patch TransDERmal Daily   ??? magnesium sulfate 2000 mg in 50 mL IVPB premix  2,000 mg IntraVENous PRN   ??? lidocaine viscous hcl (XYLOCAINE) 2 % solution 15 mL  15 mL Mouth/Throat Q3H PRN       Signed:  Helmut Muster, MD    Part of this note may have been written by using a voice dictation software.  The note has been proof read but may still contain some grammatical/other typographical errors.

## 2021-06-04 LAB — BASIC METABOLIC PANEL W/ REFLEX TO MG FOR LOW K
Anion Gap: 5 mmol/L — ABNORMAL LOW (ref 7–16)
BUN: 18 MG/DL (ref 8–23)
CO2: 31 mmol/L (ref 21–32)
Calcium: 8.3 MG/DL (ref 8.3–10.4)
Chloride: 105 mmol/L (ref 98–107)
Creatinine: 0.7 MG/DL — ABNORMAL LOW (ref 0.8–1.5)
GFR African American: >60 mL/min/{1.73_m2} (ref >60)
GFR Non-African American: >60 mL/min/{1.73_m2} (ref >60)
Glucose: 74 mg/dL (ref 65–100)
Potassium: 3.7 mmol/L (ref 3.5–5.1)
Sodium: 141 mmol/L (ref 138–145)

## 2021-06-04 LAB — MSSA/MRSA SCREEN BY PCR

## 2021-06-04 LAB — CULTURE, BLOOD 1
Culture: NO GROWTH
Culture: NO GROWTH

## 2021-06-04 LAB — MAGNESIUM: Magnesium: 1.7 mg/dL — ABNORMAL LOW (ref 1.8–2.4)

## 2021-06-04 LAB — PHOSPHORUS: Phosphorus: 2.4 MG/DL (ref 2.3–3.7)

## 2021-06-04 MED ORDER — DOXYCYCLINE HYCLATE 100 MG PO TABS
100 MG | ORAL_TABLET | Freq: Two times a day (BID) | ORAL | 0 refills | Status: AC
Start: 2021-06-04 — End: 2021-06-07

## 2021-06-04 MED ORDER — AMOXICILLIN-POT CLAVULANATE 875-125 MG PO TABS
875-125 MG | ORAL_TABLET | Freq: Two times a day (BID) | ORAL | 0 refills | Status: AC
Start: 2021-06-04 — End: 2021-06-07

## 2021-06-04 MED FILL — PIPERACILLIN SOD-TAZOBACTAM SO 3.375 (3-0.375) G IV SOLR: 3.375 (3-0.375) g | INTRAVENOUS | Qty: 3375

## 2021-06-04 MED FILL — GABAPENTIN 100 MG PO CAPS: 100 mg | ORAL | Qty: 2

## 2021-06-04 MED FILL — VITAMIN D3 25 MCG (1000 UT) PO TABS: 25 MCG (1000 UT) | ORAL | Qty: 1

## 2021-06-04 MED FILL — LORAZEPAM 0.5 MG PO TABS: 0.5 mg | ORAL | Qty: 1

## 2021-06-04 MED FILL — NICOTINE STEP 1 21 MG/24HR TD PT24: 21 mg/(24.h) | TRANSDERMAL | Qty: 1

## 2021-06-04 MED FILL — ENOXAPARIN SODIUM 30 MG/0.3ML IJ SOSY: 30 MG/0.3ML | INTRAMUSCULAR | Qty: 0.3

## 2021-06-04 MED FILL — OXYCODONE HCL 5 MG PO TABS: 5 mg | ORAL | Qty: 2

## 2021-06-04 MED FILL — NOZIN NASAL SANITIZER 62 % NA KIT: 62 % | NASAL | Qty: 1

## 2021-06-04 MED FILL — VANCOMYCIN HCL 1 G IV SOLR: 1 g | INTRAVENOUS | Qty: 1000

## 2021-06-04 NOTE — Progress Notes (Signed)
Pt PICC line flushed and positive for blood return. Unable to pull enough blood for lab work. PICC is very positional. Herbalist Manuela Schwartz, RN attempted to draw labs from PICC, unsuccessful. Dr. Virgina Evener notified via perfectserve. No new orders placed. Messaged left for Vascular Access Team to assess PICC in the am.

## 2021-06-04 NOTE — Progress Notes (Signed)
Patient were discussed in [facility] team meeting this AM.  Patient has d/c orders for today.  Patient qualified for home 02.  Patient provider for home 02 will be [facility].  Patient qualified for 1 liter home 02.  Referral completed, information faxed.  Family will transport patient home.  Patient has met all treatment goals / milestones.  CM will continue to monitor and remain available for any needs, concerns or questions that may occur.        ASSESSMENT NOTE    Attending Physician: Helmut Muster, MD  Admit Problem: Hypoxia [R09.02]  Acute respiratory failure with hypoxemia ([place]) [J96.01]  Pneumonia of both lower lobes due to infectious organism [J18.9]  Date/Time of Admission: 05/29/2021  6:36 PM  Problem List:  Patient Active Problem List   Diagnosis   ??? Emphysema lung ([place])   ??? Dehydration   ??? Vision loss   ??? Tongue cancer ([place])   ??? History of tobacco abuse   ??? Centrilobular emphysema ([place])   ??? Unstable angina ([place])   ??? Myxomatous mitral valve   ??? Acute respiratory failure with hypoxia ([place])   ??? Hyperlipidemia   ??? Anxiety and depression   ??? COPD with acute lower respiratory infection ([place])   ??? Aspiration pneumonia ([place])   ??? Severe protein-calorie malnutrition ([place])   ??? Acute respiratory failure with hypoxemia ([place])   ??? Anemia   ??? Pneumonia of both lower lobes due to infectious organism   ??? MRSA (methicillin resistant Staphylococcus aureus) infection   ??? Oral phase dysphagia   ??? S/P glossectomy   ??? Localized swelling of right upper extremity       Service Assessment  Patient Orientation Alert and Oriented   Cognition Alert   History Provided By Patient   Primary Caregiver     Accompanied By/Relationship     Support Systems Friends/Neighbors,Spouse/Significant Other (ex-spouse:  Zyron Deeley   [phone])   [place] is: Legal Next of Kin   PCP Verified by CM No (Dr [name]  [phone])   Last Visit to PCP     Prior Functional Level Independent in ADLs/IADLs   Current  Functional Level     Can patient return to prior living arrangement Yes   Ability to make needs known: Good   Family able to assist with home care needs: Yes   Would you like for me to discuss the discharge plan with any other family members/significant others, and if so, who?     Manufacturing engineer     CM/SW Referral       Social/Functional History  Lives With Other (comment) (ex-wife)   Type of Home Apartment   Home Layout One level   Home Access Level entry   Entrance Stairs - Number of Steps     Entrance Stairs - Rails     Bathroom Shower/Tub     Doctor, hospital     Receives Help From [place])   ADL Assistance Independent   Runner, broadcasting/film/video Assistance     Meal Prep     Laundry     [place]     Yard Work  Driving     Shopping     Child Care     Other (Comment)     [place] Responsibilities     Meal Prep Responsibility     Laundry Responsibility     Cleaning Responsibility     Bill Paying/Finance Responsibility     Shopping Responsibility     Dependent Care Responsibility     Health Care Management     Other (Comment)     Ambuation Assistance Independent   Transfer Assisstance Independent   Active Driver Yes   Patient's Driver Info     Mode of Scientist, product/process development   Education     Occupation On disability   Type of Occupation       Discharge Planning   Type of Residence [place] Friends/Neighbors,Spouse/Significant Other (ex-spouse:  Chapman Matteucci   [phone])   Current Services Prior To Admission Home Infusion   Potential Assistance Needed Outpatient IV   DME     DME     DME Ordered? Enteral feedings   Potential Assistance Purchasing Medications No   Meds-to-Beds: Does the patient want to have any new prescriptions delivered to bedside prior to discharge?      Type of Home Care Services OT,PT,IV Therapy   Patient expects to be discharged to: Apartment   Follow Up Appointment: Best Day/Time Monday AM   One/Two Story Residence: One story   # of Interior Steps     Height of Each Step (in)     Yahoo Available     History of [place]? Yes     Services At/After Discharge  Transition of Care Consult (CM Consult): Discharge Planning   Internal Home Health     Internal Hospice     Reason Outside [facility] [place]     Partner SNF     Reason Why Partner SNF Not Chosen     Internal Comfort Care     Reason Outside [place] Discharge None   Veteran Resource Information Provided? No   Mode of Transport at Discharge [place] Time of Discharge     Confirm Follow Up Transport Self     Condition of Participation: Discharge Planning  The plan for Transition of Care is related to the following treatment goals: return to baseline   The Patient and/or Patient Representative was provided with a Choice of Provider? Patient   Name of the Patient Representative who was provided with the Choice of Provider and agrees with the Discharge Plan?     The Patient and/or Patient Representative Agree with the Discharge Plan? Yes   Freedom of Choice list was provided with basic dialogue that supports the individualized plan of care/goals, treatment preferences, and shares the quality data associated with the providers? Yes     Documentation for Discharge Appeal  Discharge Appealed by     Date notified by QIO of appeal request:     Time notified by QIO of appeal request:     Detailed Notice of Discharge given to:     Date Notice of Discharge given:     Time Notice of Discharge given:     Date records sent to [facility] [place]     Time records sent to [place]     Date Notified of Outcome     Time Notified of Outcome     Outcome of appeal  Haig Prophet, MSW 06/04/21 12:24 PM

## 2021-06-04 NOTE — Discharge Summary (Signed)
Hospitalist Discharge Summary   Admit Date:  05/29/2021  6:36 PM   DC Note date: 06/04/2021  Name:  Caleb Robinson   Age:  70 y.o.  Sex:  male  DOB:  10-Jan-1951   MRN:  [id number]   Room:  212/01  PCP:  [name], MD, MD    Presenting Complaint: Fatigue     Initial Admission Diagnosis: Hypoxia [R09.02]  Acute respiratory failure with hypoxemia ([place]) [J96.01]  Pneumonia of both lower lobes due to infectious organism [J18.9]     Problem List for this Hospitalization (present on admission):    Principal Problem:    Acute respiratory failure with hypoxemia ([place])  Active Problems:    Anemia    Pneumonia of both lower lobes due to infectious organism    MRSA (methicillin resistant Staphylococcus aureus) infection    Oral phase dysphagia    S/P glossectomy    Localized swelling of right upper extremity    Emphysema lung ([place])    Tongue cancer ([place])    Hyperlipidemia    Anxiety and depression    Aspiration pneumonia ([place])    Severe protein-calorie malnutrition ([place])  Resolved Problems:    * No resolved hospital problems. *    Did Patient have Sepsis (YES OR NO): NO    Hospital Course:  Please refer to the admission H&P for details of presentation.     In summary, Caleb Robinson is a 70 y.o. 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  Meets criteria for sepsis with leukocytosis and tachypnea.  Noted to be hypoxic at 88% on room air requiring 2 L of O2 nasal cannula.  Chest x-ray shows increased bibasilar infiltrate concerning for aspiration pneumonia.  Pulmonary was consulted and patient underwent bronchoscopy.  BAL showing MRSA.  Blood cultures are negative. Patient continued to require oxygen during the hospitalization.  Evaluation showed that  patient will need 1 L O2 at rest but has on ambulation.     Patient is medically stable for discharge. Patient is to continue taking medications as prescribed and to follow up with PCP on discharge.  Patient is instructed to to call a physician or return to ED if any concerns/symptoms worsened.   Discharge summary and encounter summary was sent to PCP electronically via "Comm Mgt" link in [facility], if possible.      Disposition: Home  Diet: Diet NPO Exceptions are: Ice Chips  Code Status: Full Code      FOLLOW UP:   Follow-up Information     [name], MD, MD.    Specialty: Family Medicine  Why: As needed  Contact information:  [place]  [place] [address]  [phone]             [facility].    Specialty: Pulmonology  Why: As needed  Contact information:  [address] [place] [address]  [phone]                     Time spent in patient discharge and coordination 36 minutes.      Plan was discussed with patient and ex-wife.  All questions answered.  Patient was stable at time of discharge.  Instructions given  to call a physician or return if any concerns.    Current Discharge Medication List      START taking these medications    Details   amoxicillin-clavulanate (AUGMENTIN) 875-125 MG per tablet 1 tablet by PEG Tube route 2 times daily for 3 days  Qty: 6 tablet, Refills: 0      doxycycline hyclate (VIBRA-TABS) 100 MG tablet 1 tablet by PEG Tube route 2 times daily for 3 days  Qty: 6 tablet, Refills: 0         CONTINUE these medications which have NOT CHANGED    Details   sertraline (ZOLOFT) 20 MG/ML concentrated solution Take 2.5 mLs by mouth daily 2.5 mL DAILY (route: oral)  Qty: 75 mL, Refills: 5    Associated Diagnoses: Current mild episode of major depressive disorder without prior episode (HCC)      ibuprofen (ADVIL;MOTRIN) 800 MG tablet Take 1 tablet by mouth twice daily as needed for pain  Qty: 60 tablet, Refills: 0      oxyCODONE  HCl (OXY-IR) 10 MG immediate release tablet Take 1 tablet by mouth every 6 hours as needed for Pain for up to 30 days.  Qty: 120 tablet, Refills: 0    Comments: Reduce doses taken as pain becomes manageable  Associated Diagnoses: Encounter for palliative care      albuterol sulfate HFA 108 (90 Base) MCG/ACT inhaler Inhale 2 puffs into the lungs every 6 hours as needed      budesonide (PULMICORT) 0.5 MG/2ML nebulizer suspension Inhale 500 mcg into the lungs 2 times daily      chlorhexidine (PERIDEX) 0.12 % solution 10 mL 4 TIMES DAILY (route: mucous membrane)      vitamin D 25 MCG (1000 UT) CAPS Take by mouth daily      diphenoxylate-atropine (LOMOTIL) 2.5-0.025 MG per tablet Take 1 tablet by mouth 4 times daily as needed.      fluticasone (FLONASE) 50 MCG/ACT nasal spray 2 sprays by Nasal route daily      fluticasone-umeclidin-vilant (TRELEGY ELLIPTA) 100-62.5-25 MCG/INH AEPB 2 inhalation DAILY (route: inhalation)      gabapentin (NEURONTIN) 100 MG capsule Take 200 mg by mouth 3 times daily as needed.      ipratropium-albuterol (DUONEB) 0.5-2.5 (3) MG/3ML SOLN nebulizer solution Inhale 3 mLs into the lungs every 6 hours as needed      LORazepam (ATIVAN) 0.5 MG tablet Take 0.5 mg by mouth daily.         STOP taking these medications       traZODone (DESYREL) 50 MG tablet Comments:   Reason for Stopping:               Procedures done this admission:  Procedure(s):  BRONCHOSCOPY    Consults this admission:  IP CONSULT TO SOCIAL WORK  IP CONSULT TO PULMONOLOGY  IP CONSULT TO PHARMACY  IP WOUND CARE NURSE CONSULT TO EVAL  IP CONSULT TO DIETITIAN  IP CONSULT TO RESPIRATORY CARE  IP CONSULT TO RESPIRATORY CARE    Echocardiogram results:  No results found for this or any previous visit.      Diagnostic Imaging/Tests:   XR CHEST (2 VW)    Result Date: 05/29/2021  Chest X-ray INDICATION: Fever PA and lateral views of the chest were obtained. FINDINGS: There is increased infiltrate in both lung bases..  The heart size is  normal.  PICC line is present.      Increased bibasilar infiltrate     XR CHEST PORTABLE  Result Date: 05/30/2021  EXAMINATION: One view chest HISTORY: SOB TECHNIQUE: Frontal chest. COMPARISON: 05/29/2021 FINDINGS:  Stable right PICC line Persistent bibasilar lung infiltrates. Increased bilaterally. There is no significant pneumothorax. There is no pleural effusion. The heart is unchanged. No other significant interval changes.     1. Findings as described above.  621        Labs: Results:       BMP, Mg, Phos Recent Labs     06/01/21  1655 06/01/21  1655 06/02/21  1136 06/03/21  0636 06/04/21  0808   NA 144   < > 143 141 141   K 3.4*   < > 3.5 3.9 3.7   CL 108*   < > 106 107 105   CO2 32   < > 31 30 31    BUN 18   < > 15 14 18    MG 2.0  --  1.8  --  1.7*   PHOS 2.0*   < > 2.2* 2.6 2.4    < > = values in this interval not displayed.      CBC Recent Labs     06/01/21  1655 06/02/21  0728   WBC 7.6 8.0   RBC 2.81* 2.91*   HGB 8.5* 8.8*   HCT 27.1* 28.4*   PLT 244 260      LFT No results for input(s): ALT, TP, ALB, GLOB in the last 72 hours.    Invalid input(s): SGOT, TBIL, AP, AGRAT, GPT   Cardiac Testing No results found for: BNP, CPK, RCK1, CKMB   Coagulation Tests No results found for: INR, APTT   A1c No results found for: HBA1C   Lipid Panel No results found for: CHOL, CHOLPOCT, CHOLX, CHLST, CHOLV, 884269, HDL, HDLC, LDL, LDLC, 804564, VLDLC, VLDL, TGLX, TRIGL   Thyroid Panel No results found for: TSH, T4, FT4     Most Recent UA Lab Results   Component Value Date    MUCUS 1+ 04/14/2021    UCOM RESULTS VERIFIED MANUALLY 04/14/2021          All Labs from Last 24 Hrs:  Recent Results (from the past 24 hour(s))   MSSA/MRSA Screen BY PCR    Collection Time: 06/04/21  4:41 AM    Specimen: Nasal Swab   Result Value Ref Range    Special Requests NO SPECIAL REQUESTS      Culture (A)       MRSA target DNA detected, SA target DNA detected.       A positive test result does not necessarily indicate the presence of viable  organisms. It is however, presumptive for the presence of MRSA or SA.    Culture        RESULTS VERIFIED, PHONED TO AND READ BACK BY [facility] WHITE RN @ [phone] ON 06/04/21 [place].   Magnesium    Collection Time: 06/04/21  8:08 AM   Result Value Ref Range    Magnesium 1.7 (L) 1.8 - 2.4 mg/dL   Phosphorus    Collection Time: 06/04/21  8:08 AM   Result Value Ref Range    Phosphorus 2.4 2.3 - 3.7 MG/DL   Basic Metabolic Panel w/ Reflex to MG    Collection Time: 06/04/21  8:08 AM   Result Value Ref Range    Sodium 141 138 - 145 mmol/L    Potassium 3.7 3.5 - 5.1 mmol/L    Chloride 105 98 - 107 mmol/L    CO2 31 21 -  32 mmol/L    Anion Gap 5 (L) 7 - 16 mmol/L    Glucose 74 65 - 100 mg/dL    BUN 18 8 - 23 MG/DL    CREATININE 0.70 (L) 0.8 - 1.5 MG/DL    GFR African American >60 >60 ml/min/1.70m2    GFR Non-African American >60 >60 ml/min/1.29m2    Calcium 8.3 8.3 - 10.4 MG/DL       No Known Allergies  Immunization History   Administered Date(s) Administered   ??? Influenza Virus Vaccine 12/23/2018   ??? PPD Test 04/14/2021, 05/30/2021       Recent Vital Data:  Patient Vitals for the past 24 hrs:   Temp Pulse Resp BP SpO2   06/04/21 0730 97.6 ??F (36.4 ??C) 62 21 (!) 88/52 --   06/04/21 0437 98.1 ??F (36.7 ??C) 66 20 97/77 92 %   06/03/21 2358 98.4 ??F (36.9 ??C) 80 24 (!) 93/57 91 %   06/03/21 1855 98.1 ??F (36.7 ??C) 75 18 (!) 103/59 91 %       Oxygen Therapy  SpO2: 92 %  Pulse via Oximetry: 62 beats per minute  Pulse Oximeter Device Mode: Intermittent  O2 Device: Nasal cannula  FiO2 : 28 %  O2 Flow Rate (L/min): 2 L/min    Estimated body mass index is 15.47 kg/m?? as calculated from the following:    Height as of this encounter: 5\' 4"  (1.626 m).    Weight as of this encounter: 90 lb 1.6 oz (40.9 kg).    Intake/Output Summary (Last 24 hours) at 06/04/2021 1114  Last data filed at 06/04/2021 0442  Gross per 24 hour   Intake 910 ml   Output 250 ml   Net 660 ml         Physical Exam:    General:          Cachetic, chronically ill appearing     Head:                Normocephalic, atraumatic  Eyes:               Sclerae appear normal.  Pupils equally round.  ENT:                Nares appear normal, no drainage.  Moist oral mucosa  Neck:               No restricted ROM.  Trachea midline   CV:                  RRR.  No m/r/g.  No jugular venous distension.  Lungs:             Coarse breath sounds, no wheezing  Abdomen:        Bowel sounds present.  Soft, nontender, nondistended. +PEG  Extremities:     No cyanosis or clubbing.  No edema  Skin:                No rashes and normal coloration.   Warm and dry.    Neuro:             CN II-XII grossly intact.  A&Ox3  Psych:             Normal mood and affect.          Signed:  Helmut Muster, MD    Part of this note may have been written by using a voice dictation software.  The note has been  proof read but may still contain some grammatical/other typographical errors.

## 2021-06-04 NOTE — Discharge Instructions (Signed)
Learning About Respiratory Failure  What is respiratory failure?     Respiratory failure happens when a person's lungs can't get enough oxygen to the blood. This is a severe problem that may need to be treated in intensivecare.  Many organs such as the eyes, the brain, and the heart depend on a steady supply of oxygen they get from the blood. The doctor will try to get enoughoxygen to those organs to keep them healthy.  Many things can cause lung failure. They include pneumonia and other serious infections. The doctor will look for the cause of the problem and then treat itif possible.  How is it treated?  To help your lungs get enough oxygen, your doctor may use a few devices. Thesevary in how much oxygen they give and how they help you breathe. They are:  Marland Kitchen A nasal cannula (say "KAN-yuh-luh"). This is a thin tube with two prongs that fit just inside your nose. Or you may get a face mask.  . A special face mask that delivers more oxygen. There are different kinds. A face mask with a bag on one end is called a non-rebreather mask.  . A high-flow nasal cannula. It can warm and wet the oxygen it delivers, so getting high amounts of oxygen feels better.  . A face mask that gives you oxygen through a bilevel positive airway pressure (BPAP) machine. You may also hear this called BiPAP. It uses different air pressures when you breathe in and out.  . A ventilator that helps you breathe or that breathes for you. It controls how much air and oxygen flow into your lungs. This machine requires a breathing tube in your windpipe. It can be uncomfortable, so you may get medicine to help you relax or sleep. You also will get fluid through an intravenous (IV) tube.  You will get regular tests to see how much oxygen is in your blood. Tests also can show how well the lungs are working. These tests help your doctor adjustthe machines and the oxygen supply.  The doctor will watch you closely.  Current as of: March 9,  2022Content Version: 13.3   2006-2022 [employer].   Care instructions adapted under license by [facility]. If you have questions about a medical condition or this instruction, always ask your healthcare professional. [place] any warranty or liability for your use of this information.

## 2021-06-04 NOTE — Progress Notes (Signed)
END OF SHIFT NOTE:    INTAKE/OUTPUT  06/26 0701 - 06/27 0700  In: 1450 [I.V.:400]  Out: 250 [Urine:250]  Voiding: Yes  Catheter: No  Drain:   Gastrostomy/Enterostomy/Jejunostomy Tube Percutaneous Endoscopic Gastrostomy (PEG) LLQ (Active)   Drainage Appearance None 06/03/21 1940   Site Description Clean, dry & intact 06/03/21 1940   J Port Status Clamped 06/02/21 0730   G Port Status Clamped 06/04/21 0039   Surrounding Skin Clean, dry & intact 06/04/21 0039   Dressing Status Clean, dry & intact 06/04/21 0039   Dressing Type Dry dressing 06/04/21 0039   G-Tube Care Completed Yes 06/04/21 0039   Tube Feeding Standard with Fiber 06/04/21 0039   Tube Feeding Intake (mL) 250 ml 06/03/21 1940   Free Water/Flush (mL) 70 mL 06/03/21 2152   Output (mL) 0 ml 05/31/21 1942   Action Taken Other (comment) 06/03/21 1940               Flatus: Patient does not have flatus present.    Stool: 0 occurrences.    Characteristics:           Stool Assessment  Last BM (including prior to admit): 05/31/21    Emesis:  occurrences.    Characteristics:        VITAL SIGNS  Patient Vitals for the past 12 hrs:   Temp Pulse Resp BP SpO2   06/04/21 0437 98.1 ??F (36.7 ??C) 66 20 97/77 92 %   06/03/21 2358 98.4 ??F (36.9 ??C) 80 24 (!) 93/57 91 %   06/03/21 1855 98.1 ??F (36.7 ??C) 75 18 (!) 103/59 91 %       Pain Assessment  @BSHSIFLOW (13)@  Pain Location: Jaw,Mouth  Patient's Stated Pain Goal: 0 - No pain    Ambulating  Yes    Shift report given to oncoming nurse at the bedside.    MICHAELA WHITE, RN

## 2021-06-04 NOTE — Progress Notes (Signed)
Pt's D/C instructions completed.  Verbalized understanding of all instructions including diet, activity, s/sx to alert MD, medications, wound care, and f/u appointment.  Family at BS.

## 2021-06-05 NOTE — Progress Notes (Signed)
Received call from [facility] with [facility]. Order needs to be renewed to continue IVF at home. Verbal order given.

## 2021-06-06 NOTE — Care Coordination-Inpatient (Signed)
Lyman Balingit is a 70 y.o. male recently discharged  ( 06/04/21)  from [facility] (facility) with a reported diagnosis of Acute respiratory failure with hypoxemia w/ pneumonia d/t MRSA.  The patient was contacted post discharge and the answers were provided by pt.  Patient/CG had  understanding of reason for admission.  Medications were reviewed and the patient had changes to medications.  Coaching for symptoms related to disease demonstrates  and demonstrates  self-management skills.  The patient reports he fell face first the day he came from the hospital, unsure of cause. Pt was coached on safety to prevent falls.  The patient will receive a follow up call to monitor adherence and understanding as part of Transitions of Care.      Ambulatory Care Coordination Note  06/06/2021  CM Risk Score: 1  [place] 10 Year Mortality Risk Score: 79%     ACC: Tilford Pillar, RN    Summary Note:     [facility] RN visit scheduled 06/08/21.  O2 sat 95-96% on 1lr, if pt takes off w/ ambulation drops to 85% w/ activity.    RNCM called [facility] to regarding moving appt up d/t TOC, on hold extended period of time >53mins, ended call. RNCM called PCP office scheduled appt 06/14/21 915a.   Pt notified and agreeable.     Lab Results     None          Care Coordination Interventions    Referral from Primary Care Provider: No  Suggested Interventions and Community Resources         Goals Addressed                 This Visit's Progress    ??? Conditions and Symptoms   Improving     I will schedule office visits, as directed by my provider.  I will keep my appointment or reschedule if I have to cancel.  I will notify my provider of any barriers to my plan of care.  I will follow my Zone Management tool to seek urgent or emergent care.  I will notify my provider of any symptoms that indicate a worsening of my condition.    Barriers: none  Plan for overcoming my barriers: N/A  Confidence: 10/10  Anticipated Goal Completion Date: 07/25/21                Prior to  Admission medications    Medication Sig Start Date End Date Taking? Authorizing Provider   amoxicillin-clavulanate (AUGMENTIN) 875-125 MG per tablet 1 tablet by PEG Tube route 2 times daily for 3 days 06/04/21 06/07/21  Helmut Muster, MD   doxycycline hyclate (VIBRA-TABS) 100 MG tablet 1 tablet by PEG Tube route 2 times daily for 3 days 06/04/21 06/07/21  Helmut Muster, MD   sertraline (ZOLOFT) 20 MG/ML concentrated solution Take 2.5 mLs by mouth daily 2.5 mL DAILY (route: oral) 05/28/21   [name], MD   ibuprofen (ADVIL;MOTRIN) 800 MG tablet Take 1 tablet by mouth twice daily as needed for pain 05/25/21   Thereasa Distance, MD   oxyCODONE HCl (OXY-IR) 10 MG immediate release tablet Take 1 tablet by mouth every 6 hours as needed for Pain for up to 30 days. 05/17/21 06/16/21  [name] Crigler, APRN - CNP   albuterol sulfate HFA 108 (90 Base) MCG/ACT inhaler Inhale 2 puffs into the lungs every 6 hours as needed 02/14/21   Ar Automatic Reconciliation   budesonide (PULMICORT) 0.5 MG/2ML nebulizer suspension Inhale 500  mcg into the lungs 2 times daily 02/14/21   Ar Automatic Reconciliation   chlorhexidine (PERIDEX) 0.12 % solution 10 mL 4 TIMES DAILY (route: mucous membrane) 10/14/20   Ar Automatic Reconciliation   vitamin D 25 MCG (1000 UT) CAPS Take by mouth daily    Ar Automatic Reconciliation   diphenoxylate-atropine (LOMOTIL) 2.5-0.025 MG per tablet Take 1 tablet by mouth 4 times daily as needed. 01/23/21   Ar Automatic Reconciliation   fluticasone (FLONASE) 50 MCG/ACT nasal spray 2 sprays by Nasal route daily 03/26/21   Ar Automatic Reconciliation   fluticasone-umeclidin-vilant (TRELEGY ELLIPTA) 100-62.5-25 MCG/INH AEPB 2 inhalation DAILY (route: inhalation) 08/21/20   Ar Automatic Reconciliation   gabapentin (NEURONTIN) 100 MG capsule Take 200 mg by mouth 3 times daily as needed. 02/06/21   Ar Automatic Reconciliation   ipratropium-albuterol (DUONEB) 0.5-2.5 (3) MG/3ML SOLN nebulizer solution Inhale 3 mLs into the lungs every 6 hours as  needed 02/14/21   Ar Automatic Reconciliation   LORazepam (ATIVAN) 0.5 MG tablet Take 0.5 mg by mouth daily. 01/30/21   Ar Automatic Reconciliation       Future Appointments   Date Time Provider [place]   06/14/2021  9:15 AM [name], MD FPA [facility] AM[facility]   06/26/2021  2:15 PM [name], MD FPA [facility] AMB   07/19/2021 11:20 AM Shella Spearing, MD PPS [facility] AMB   08/03/2021  6:23 PM SFD CT 31 SLICE UNIT 1 SFDRCT SFD   08/06/2021 10:10 AM [facility]   08/06/2021 10:45 AM Rayann Heman, MD [facility]   08/06/2021 10:45 AM Carron Brazen Garey Ham, RD [facility] AMB   08/09/2021  1:00 PM Osa Craver, MD [facility]      and   General Assessment    Do you have any symptoms that are causing concern?: No           Tilford Pillar, RN  06/06/2021

## 2021-06-14 ENCOUNTER — Encounter: Payer: MEDICARE | Attending: Family Medicine | Primary: Family Medicine

## 2021-06-19 NOTE — Care Coordination-Inpatient (Signed)
Ambulatory Care Coordination Note  06/19/2021  CM Risk Score: 1  [place] 10 Year Mortality Risk Score: 79%     ACC: Tilford Pillar, RN    Summary Note: Pt reports missed PCP appt 7/7 wasn't feeling well, improved now. Denies SOB, or other sxs. PCP appt rescheduled 7/19.  Pt reports hasn't had PICC line changed since dc from hospital and hasn't been seen by [facility].     RNCM called [facility] who stated they need new orders, pt was dc'd 06/12/21, can see pt tomorrow with new orders today.      RNCM called BS ONC, spoke w/ Lynnell Chad who will submit orders to [facility] today.     RNCM called pt back to inform of above.     Lab Results     None          Care Coordination Interventions    Referral from Primary Care Provider: No  Suggested Interventions and [place]: In Process (Comment: [facility])         Goals Addressed    None         Prior to Admission medications    Medication Sig Start Date End Date Taking? Authorizing Provider   sertraline (ZOLOFT) 20 MG/ML concentrated solution Take 2.5 mLs by mouth daily 2.5 mL DAILY (route: oral) 05/28/21   [name], MD   ibuprofen (ADVIL;MOTRIN) 800 MG tablet Take 1 tablet by mouth twice daily as needed for pain 05/25/21   Thereasa Distance, MD   albuterol sulfate HFA 108 (90 Base) MCG/ACT inhaler Inhale 2 puffs into the lungs every 6 hours as needed 02/14/21   Ar Automatic Reconciliation   budesonide (PULMICORT) 0.5 MG/2ML nebulizer suspension Inhale 500 mcg into the lungs 2 times daily 02/14/21   Ar Automatic Reconciliation   chlorhexidine (PERIDEX) 0.12 % solution 10 mL 4 TIMES DAILY (route: mucous membrane) 10/14/20   Ar Automatic Reconciliation   vitamin D 25 MCG (1000 UT) CAPS Take by mouth daily    Ar Automatic Reconciliation   diphenoxylate-atropine (LOMOTIL) 2.5-0.025 MG per tablet Take 1 tablet by mouth 4 times daily as needed. 01/23/21   Ar Automatic Reconciliation   fluticasone (FLONASE) 50 MCG/ACT nasal spray 2 sprays by Nasal route daily  03/26/21   Ar Automatic Reconciliation   fluticasone-umeclidin-vilant (TRELEGY ELLIPTA) 100-62.5-25 MCG/INH AEPB 2 inhalation DAILY (route: inhalation) 08/21/20   Ar Automatic Reconciliation   gabapentin (NEURONTIN) 100 MG capsule Take 200 mg by mouth 3 times daily as needed. 02/06/21   Ar Automatic Reconciliation   ipratropium-albuterol (DUONEB) 0.5-2.5 (3) MG/3ML SOLN nebulizer solution Inhale 3 mLs into the lungs every 6 hours as needed 02/14/21   Ar Automatic Reconciliation   LORazepam (ATIVAN) 0.5 MG tablet Take 0.5 mg by mouth daily. 01/30/21   Ar Automatic Reconciliation       Future Appointments   Date Time Provider [place]   06/26/2021  2:15 PM [name], MD FPA [facility] AM[facility]   07/19/2021 11:20 AM Shella Spearing, MD [facility] AMB   08/03/2021  8:41 PM SFD CT 51 SLICE UNIT 1 SFDRCT SFD   08/06/2021 10:10 AM [facility]   08/06/2021 10:45 AM Rayann Heman, MD [facility] AMB   08/06/2021 10:45 AM Carron Brazen Garey Ham, RD [facility] AMB   08/09/2021  1:00 PM Osa Craver, MD [facility]      and   General Assessment    Do you have any  symptoms that are causing concern?: No

## 2021-06-20 ENCOUNTER — Encounter

## 2021-06-20 NOTE — Care Coordination-Inpatient (Signed)
RNCM received communication from [place] working on getting PICC line orders to [facility] today, pt notified. No c/o voiced at this time.

## 2021-06-21 NOTE — Care Coordination-Inpatient (Signed)
RNCM received call from [facility] who confirmed services restarted w/ RN visit this am, unsure of why visits were previously discontinued.     RNCM scheduled f/u in 2wks.

## 2021-06-22 ENCOUNTER — Encounter

## 2021-06-22 MED ORDER — OXYCODONE HCL 10 MG PO TABS
10 MG | ORAL_TABLET | Freq: Four times a day (QID) | ORAL | 0 refills | Status: AC | PRN
Start: 2021-06-22 — End: 2021-07-04

## 2021-06-22 NOTE — Progress Notes (Signed)
I have reviewed the patient's controlled substance prescription history, as maintained in the [place] prescription monitoring program, so that the prescriptions(s) for a controlled substance can be given.  Last Date Reviewed: 06/22/21    Patient has new patient appointment with pain management on 7/26.  A 12 day oxycodone supply is provided.

## 2021-06-25 ENCOUNTER — Ambulatory Visit: Admit: 2021-06-25 | Discharge: 2021-06-26 | Payer: MEDICARE | Primary: Family Medicine

## 2021-06-25 ENCOUNTER — Encounter

## 2021-06-25 ENCOUNTER — Ambulatory Visit: Admit: 2021-06-25 | Discharge: 2021-06-25 | Payer: MEDICARE | Attending: Family | Primary: Family Medicine

## 2021-06-25 ENCOUNTER — Inpatient Hospital Stay: Admit: 2021-06-25 | Payer: MEDICARE | Primary: Family Medicine

## 2021-06-25 ENCOUNTER — Inpatient Hospital Stay: Payer: MEDICARE | Primary: Family Medicine

## 2021-06-25 DIAGNOSIS — Z008 Encounter for other general examination: Secondary | ICD-10-CM

## 2021-06-25 DIAGNOSIS — C029 Malignant neoplasm of tongue, unspecified: Secondary | ICD-10-CM

## 2021-06-25 LAB — CBC WITH AUTO DIFFERENTIAL
Absolute Eos #: 0.2 10*3/uL (ref 0.0–0.8)
Absolute Immature Granulocyte: 0 10*3/uL (ref 0.0–0.5)
Absolute Lymph #: 1.1 10*3/uL (ref 0.5–4.6)
Absolute Mono #: 0.4 10*3/uL (ref 0.1–1.3)
Basophils Absolute: 0.1 10*3/uL (ref 0.0–0.2)
Basophils: 2 % (ref 0.0–2.0)
Eosinophils %: 5 % (ref 0.5–7.8)
Hematocrit: 32.3 %
Hemoglobin: 9.9 g/dL — ABNORMAL LOW (ref 13.6–17.2)
Immature Granulocytes: 0 % (ref 0.0–5.0)
Lymphocytes: 22 % (ref 13–44)
MCH: 30.7 PG (ref 26.1–32.9)
MCHC: 30.7 g/dL — ABNORMAL LOW (ref 31.4–35.0)
MCV: 100.3 FL — ABNORMAL HIGH (ref 79.6–97.8)
MPV: 11.3 FL (ref 9.4–12.3)
Monocytes: 9 % (ref 4.0–12.0)
Platelets: 181 10*3/uL (ref 150–450)
RBC: 3.22 M/uL — ABNORMAL LOW (ref 4.23–5.6)
RDW: 16.9 % — ABNORMAL HIGH (ref 11.9–14.6)
Seg Neutrophils: 62 % (ref 43–78)
Segs Absolute: 3 10*3/uL (ref 1.7–8.2)
WBC: 4.9 10*3/uL (ref 4.3–11.1)
nRBC: 0 10*3/uL (ref 0.0–0.2)

## 2021-06-25 LAB — COMPREHENSIVE METABOLIC PANEL
ALT: 43 U/L (ref 12–65)
AST: 42 U/L — ABNORMAL HIGH (ref 15–37)
Albumin/Globulin Ratio: 1 — ABNORMAL LOW (ref 1.2–3.5)
Albumin: 3 g/dL — ABNORMAL LOW (ref 3.2–4.6)
Alk Phosphatase: 77 U/L (ref 50–136)
Anion Gap: 5 mmol/L — ABNORMAL LOW (ref 7–16)
BUN: 36 MG/DL — ABNORMAL HIGH (ref 8–23)
CO2: 29 mmol/L (ref 21–32)
Calcium: 9.5 MG/DL (ref 8.3–10.4)
Chloride: 106 mmol/L (ref 98–107)
Creatinine: 0.7 MG/DL — ABNORMAL LOW (ref 0.8–1.5)
GFR African American: >60 mL/min/{1.73_m2} (ref >60)
GFR Non-African American: >60 mL/min/{1.73_m2} (ref >60)
Globulin: 3.1 g/dL (ref 2.3–3.5)
Glucose: 73 mg/dL (ref 65–100)
Potassium: 4.7 mmol/L (ref 3.5–5.1)
Sodium: 140 mmol/L (ref 136–145)
Total Bilirubin: 0.3 MG/DL (ref 0.2–1.1)
Total Protein: 6.1 g/dL — ABNORMAL LOW (ref 6.3–8.2)

## 2021-06-25 MED ORDER — NORMAL SALINE FLUSH 0.9 % IV SOLN
0.9 % | INTRAVENOUS | Status: DC | PRN
Start: 2021-06-25 — End: 2021-06-26
  Administered 2021-06-25: 18:00:00 10 mL via INTRAVENOUS

## 2021-06-25 NOTE — Progress Notes (Signed)
Patient arrived to port lab for port access and lab draw   Picc accessed and labs drawn per protocol   *Picc remains accessed   Patient discharged from port lab ambulatory*

## 2021-06-25 NOTE — Progress Notes (Signed)
[facility] VISIT         Patient Name: Caleb Robinson    Date of Visit: 06/25/2021     DOB: Aug 26, 1951   Age: 70 y.o.                   Presenting Complaint:   Caleb Robinson  is seen in follow-up for a  tongue cancer.      History of Present Illness:   [name] was seen for the first time in our office in July 2021.  He was a gentleman with a rather substantial alcohol and tobacco history.  He has smoked upwards of 2-1/2 packs/day during the  course of his life but now is down to less than a pack.  He indicated that his alcohol use was substantial prior to 9 years ago when his intake decreased significant after the death of his wife.  In December 18, 2019 he first noticed some tongue discomfort  associated with weight loss.  By May 2021 his discomfort had significantly increased and he sought medical attention.  CT scanning of his neck on May 26 showed right greater than left submandibular gland enlargement with an indeterminate lymph node at  level 2 on the right he was seen by Dr. Elby Beck on May 28.  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   The remainder of the laryngoscopy was negative.  A subsequent PET scan was performed notable for FDG uptake within the ventral tongue.  There was an FDG avid right level 2A lymph node concerning for metastasis.  There was faint FDG uptake associated  with nodules noted in the left lung 1 of which was new from a CT on June 23.  A previously seen right lung nodule was not FDG avid. On October 20, he underwent excision of most of the tongue floor of the mouth and jaw with a radical neck dissection.   He had a transient tracheostomy and a G-tube was placed.  The pathology report is not visible in Care Everywhere at this time.  However, Dr. Elana Alm in his notes,  described the tumor as [id number] clinically.      He is here today for hospital follow up.  He was hospitalized 6/21 - 06/04/21 for hypoxia and aspiration pneumonia (MRSA on BAL).  He c/w feedings via PEG tube and HH with IVFs at home.  He is also receiving IVFs every other day or every third day for dehydration.  He feels better since leaving the hospital.  His breathing has improved.  He denies any issues with tube feedings.  He denies any fevers since leaving the hospital.  Pt is agreeable to having a port placed given need for frequent IVFs.            Medications:   Current Outpatient Medications   Medication Sig Dispense Refill    oxyCODONE HCl (OXY-IR) 10 MG immediate release tablet Take 1 tablet by mouth every 6 hours as needed for Pain for up to 12 days. 48 tablet 0    sertraline (ZOLOFT) 20 MG/ML concentrated solution Take 2.5 mLs by mouth daily 2.5 mL DAILY (route: oral) 75 mL 5    ibuprofen (ADVIL;MOTRIN) 800 MG tablet Take 1 tablet by mouth twice daily as needed for pain 60 tablet 0    albuterol sulfate HFA 108 (90 Base) MCG/ACT  inhaler Inhale 2 puffs into the lungs every 6 hours as needed      budesonide (PULMICORT) 0.5 MG/2ML nebulizer suspension Inhale 500 mcg into the lungs 2 times daily      chlorhexidine (PERIDEX) 0.12 % solution 10 mL 4 TIMES DAILY (route: mucous membrane)      vitamin D 25 MCG (1000 UT) CAPS Take by mouth daily      diphenoxylate-atropine (LOMOTIL) 2.5-0.025 MG per tablet Take 1 tablet by mouth 4 times daily as needed.      fluticasone (FLONASE) 50 MCG/ACT nasal spray 2 sprays by Nasal route daily      fluticasone-umeclidin-vilant (TRELEGY ELLIPTA) 100-62.5-25 MCG/INH AEPB 2 inhalation DAILY (route: inhalation)      gabapentin (NEURONTIN) 100 MG capsule Take 200 mg by mouth 3 times daily as needed.      ipratropium-albuterol (DUONEB) 0.5-2.5 (3) MG/3ML SOLN nebulizer solution Inhale 3 mLs into the lungs every 6 hours as needed      LORazepam (ATIVAN) 0.5 MG tablet Take 0.5 mg  by mouth daily.       No current facility-administered medications for this visit.          Allergies:  No Known Allergies          Review of Systems:   The Review of Systems is documented in full in the internal medical record. All systems are negative other than for those noted above.      Past Medical History:  Past Medical History:   Diagnosis Date    Chronic obstructive pulmonary disease ([place])     follows with Pulmonology.  daily and rescure inhaler. no home O2.    Emphysema lung (HCC)     HTN (hypertension)     Tongue cancer [facility])           Past Surgical History:  Past Surgical History:   Procedure Laterality Date    BRONCHOSCOPY N/A 05/30/2021    BRONCHOSCOPY performed by Rogue Bussing, MD at [facility]  05/2019          Social History:  Social History     Socioeconomic History    Marital status: Single     Spouse name: Not on file    Number of children: Not on file    Years of education: Not on file    Highest education level: Not on file   Occupational History    Not on file   Tobacco Use    Smoking status: Every Day     Packs/day: 0.75     Types: Cigarettes    Smokeless tobacco: Never    Tobacco comments:     Quit smoking: stopped but restarted Dec 2019   Substance and Sexual Activity    Alcohol use: Yes     Alcohol/week: 1.0 standard drink    Drug use: Never    Sexual activity: Not on file     Comment: widow   Other Topics Concern    Not on file   Social History Narrative    Not on file     Social Determinants of Health     Financial Resource Strain: Not on file   Food Insecurity: Not on file   Transportation Needs: Not on file   Physical Activity: Not on file   Stress: Not on file   Social Connections: Not on file   Intimate Partner Violence: Not on file   Housing Stability: Not on file  Family History:  Family History   Problem Relation Age of Onset    Dementia Mother          Physical Examination:   General Appearance: Thin chronically ill-appearing patient in no acute  distress.    Vital signs:    Visit Vitals  There were no vitals filed for this visit.        Performance Status: ECOG Level  2   Distress Screening Score:  PHQ-9  03/26/2021 03/21/2021 02/06/2021   Little interest or pleasure in doing things 0 0 2   Trouble falling or staying asleep, or sleeping too much - - 2   Feeling tired or having little energy - - 3   Poor appetite, weight loss, or overeating - - 3   Feeling bad about yourself - or that you are a failure or have let yourself or your family down - - 1   Trouble concentrating on things such as school, work, reading, or watching TV - - 0   Moving or speaking so slowly that other people could have noticed; or the opposite being so fidgety that others notice - - 1   Thoughts of being better off dead, or hurting yourself in some way - - 0   Total Score PHQ 2 0 0 4   PHQ 9 Score - - 14   How difficult have these problems made it for you to do your work, take care of your home and get along with others - - Very difficult          Pain Scale:  /10      HEENT: He has lost virtually all of his tongue and has a muscle flap lining the floor of the mouth. Most of the crusted areas of healed. Most notably however there is evident metal from the mandibular prosthesis that is evident in areas where the mandibular  mucosa has not covered it. In addition, the fibular autograft is more fully visible posteriorly as well.    Neck: Extensive surgery evident   Lymph nodes: Hx: There is extensive surgery in the neck but no nodes are palpable. He complains of firmness in the right submandibular region. It is hard to discern whether there is any tumor in this location.   Lungs: The lungs are clear to auscultation and percussion. There is no egophony. There is no chest wall tenderness and no  use of accessory respiratory musculature.   Heart: There is no jugular venous distention. The rate is normal and rhythm regular. The S1 and S2 are normal and there are no murmurs or rubs.     Abdomen:  Soft, non-tender, bowel sounds present and normal, no appreciated hepatosplenomegaly. No palpable masses.  G-tube in place.  Skin: No rash, petechiae or ecchymoses. No evidence of malignancy.    Extremities: No cyanosis, clubbing or edema.         Labs/Imaging:  No visits with results within 3 Day(s) from this visit.   Latest known visit with results is:   No results displayed because visit has over 200 results.             Above results reviewed with patient.        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 He is certainly a candidate for radiation therapy and perhaps chemo and  radiation therapy as well. Nevertheless, I believe his functional level would only  tolerate one of those modalities and it should be radiation therapy in this situation. He is scheduled to start on January 10. I do have ongoing concerns about the status  of the graft and the reconstruction of his mandible. Prior to initiating any therapy I am thankful that he is being seen by his surgeons before initiating any treatment.     He is here today for hospital follow up.  He was hospitalized 6/21 - 06/04/21 for hypoxia and aspiration pneumonia (MRSA on BAL).  He c/w feedings via PEG tube and HH with IVFs at home.  He is also receiving IVFs every other day or every third day for dehydration.  He feels better since leaving the hospital.  His breathing has improved.  He denies any issues with tube feedings.  He denies any fevers since leaving the hospital.  Pt is agreeable to having a port placed given need for frequent IVFs.        PLAN:  C/w HH for Tube feeding and IVFs  Agreeable to having port placed d/t ongoing need for IVFs    Labs reviewed and relatively stable.      F/u with Dr. Sonny Masters the end of August as scheduled, or sooner as needed.          RESUSCITATION DIRECTIVES/HOSPICE CARE;   Full Support                 Kerby Nora, FNP-C  [facility] [place] [address]  Office : [phone]  Fax : [phone]

## 2021-06-25 NOTE — Patient Instructions (Signed)
Patient Instructions from Today's Visit    Reason for Visit:  Hospital Follow Up     Plan:  We will refer you to Interventional Radiology for PORT placement - we will get rid of your PICC line    Continue with home IV Fluids every 2-3 days     We will reach out to the nurse navigator who works with Dr. Elana Alm who did your surgery previously.  They are at [facility] in [place].    [facility] provides your tube feeding supplies - [phone].  You have to call them every month for your new monthly supplies.     Follow Up:  ~ 6 weeks with Dr. Sonny Masters after CT scan     Recent Lab Results:  Hospital Outpatient Visit on 06/25/2021   Component Date Value Ref Range Status    WBC 06/25/2021 4.9  4.3 - 11.1 K/uL Final    RBC 06/25/2021 3.22 (A) 4.23 - 5.6 M/uL Final    Hemoglobin 06/25/2021 9.9 (A) 13.6 - 17.2 g/dL Final    Hematocrit 06/25/2021 32.3  % Final    MCV 06/25/2021 100.3 (A) 79.6 - 97.8 FL Final    MCH 06/25/2021 30.7  26.1 - 32.9 PG Final    MCHC 06/25/2021 30.7 (A) 31.4 - 35.0 g/dL Final    RDW 06/25/2021 16.9 (A) 11.9 - 14.6 % Final    Platelets 06/25/2021 181  150 - 450 K/uL Final    MPV 06/25/2021 11.3  9.4 - 12.3 FL Final    nRBC 06/25/2021 0.00  0.0 - 0.2 K/uL Final    **Note: Absolute NRBC parameter is now reported with Hemogram**    Differential Type 06/25/2021 AUTOMATED    Final    Seg Neutrophils 06/25/2021 62  43 - 78 % Final    Lymphocytes 06/25/2021 22  13 - 44 % Final    Monocytes 06/25/2021 9  4.0 - 12.0 % Final    Eosinophils % 06/25/2021 5  0.5 - 7.8 % Final    Basophils 06/25/2021 2  0.0 - 2.0 % Final    Immature Granulocytes 06/25/2021 0  0.0 - 5.0 % Final    Segs Absolute 06/25/2021 3.0  1.7 - 8.2 K/UL Final    Absolute Lymph # 06/25/2021 1.1  0.5 - 4.6 K/UL Final    Absolute Mono # 06/25/2021 0.4  0.1 - 1.3 K/UL Final    Absolute Eos # 06/25/2021 0.2  0.0 - 0.8 K/UL Final    Basophils Absolute 06/25/2021 0.1  0.0 - 0.2 K/UL Final    Absolute Immature Granulocyte 06/25/2021 0.0  0.0 -  0.5 K/UL Final    Sodium 06/25/2021 140  136 - 145 mmol/L Final    Potassium 06/25/2021 4.7  3.5 - 5.1 mmol/L Final    Chloride 06/25/2021 106  98 - 107 mmol/L Final    CO2 06/25/2021 29  21 - 32 mmol/L Final    Anion Gap 06/25/2021 5 (A) 7 - 16 mmol/L Final    Glucose 06/25/2021 73  65 - 100 mg/dL Final    BUN 06/25/2021 36 (A) 8 - 23 MG/DL Final    CREATININE 06/25/2021 0.70 (A) 0.8 - 1.5 MG/DL Final    GFR African American 06/25/2021 >60  >60 ml/min/1.3m Final    GFR Non-African American 06/25/2021 >60  >60 ml/min/1.747mFinal    Comment:      Estimated GFR is calculated using the Modification of Diet in Renal Disease (MDRD) Study equation, reported for  both African Americans (GFRAA) and non-African Americans (GFRNA), and normalized to 1.8m body surface area. The physician must decide which value applies to the patient.  The MDRD study equation should only be used in individuals age 3740or older. It has not been validated for the following: pregnant women, patients with serious comorbid conditions,or on certain medications, or persons with extremes of body size, muscle mass, or nutritional status.      Calcium 06/25/2021 9.5  8.3 - 10.4 MG/DL Final    Total Bilirubin 06/25/2021 0.3  0.2 - 1.1 MG/DL Final    ALT 06/25/2021 43  12 - 65 U/L Final    AST 06/25/2021 42 (A) 15 - 37 U/L Final    Alk Phosphatase 06/25/2021 77  50 - 136 U/L Final    Total Protein 06/25/2021 6.1 (A) 6.3 - 8.2 g/dL Final    Albumin 06/25/2021 3.0 (A) 3.2 - 4.6 g/dL Final    Globulin 06/25/2021 3.1  2.3 - 3.5 g/dL Final    Albumin/Globulin Ratio 06/25/2021 1.0 (A) 1.2 - 3.5   Final         Treatment Summary has been discussed and given to patient: NA        -------------------------------------------------------------------------------------------------------------------  Please call our office at [phone] you have any  of the following symptoms:   Fever of 100.5 or greater  Chills  Shortness of breath  Swelling or pain in one  leg    After office hours an answering service is available and will contact a provider for emergencies or if you are experiencing any of the above symptoms.    Patient does express an interest in My Chart.  My Chart log in information explained on the after visit summary printout at the [email or web address].    MMayo CSO, LD  Outpatient Oncology Dietitian  Head and Neck Tumor Navigator  [phone] [email or web address]

## 2021-06-26 ENCOUNTER — Ambulatory Visit: Admit: 2021-06-26 | Discharge: 2021-06-26 | Payer: MEDICARE | Attending: Family Medicine | Primary: Family Medicine

## 2021-06-26 DIAGNOSIS — J432 Centrilobular emphysema: Secondary | ICD-10-CM

## 2021-06-26 MED ORDER — IPRATROPIUM-ALBUTEROL 0.5-2.5 (3) MG/3ML IN SOLN
Freq: Four times a day (QID) | RESPIRATORY_TRACT | 3 refills | Status: AC | PRN
Start: 2021-06-26 — End: ?

## 2021-06-26 MED ORDER — BUDESONIDE 0.5 MG/2ML IN SUSP
0.5 MG/2ML | Freq: Two times a day (BID) | RESPIRATORY_TRACT | 3 refills | Status: DC
Start: 2021-06-26 — End: 2021-07-19

## 2021-06-26 NOTE — Progress Notes (Signed)
Caleb Robinson (DOB:  1951-10-13) is a 70 y.o. male,Established patient, here for evaluation of the following chief complaint(s):  6 Month Follow-Up (Had pneumonia was in hospital twice. Left leg giving out )         ASSESSMENT/PLAN:  1. Centrilobular emphysema (HCC)  -     budesonide (PULMICORT) 0.5 MG/2ML nebulizer suspension; Take 2 mLs by nebulization in the morning and 2 mLs before bedtime., Disp-60 each, R-3Normal  -     ipratropium-albuterol (DUONEB) 0.5-2.5 (3) MG/3ML SOLN nebulizer solution; Inhale 3 mLs into the lungs every 6 hours as needed for Shortness of Breath, Disp-360 mL, R-3Normal  2. Tongue cancer ([place])  3. Anxiety and depression  4. Severe protein-calorie malnutrition ([place])  5. Right ear impacted cerumen    PLAN:  Ear cleared of cerumen  He has PT scheduled at home next week;   Encouraged him to get a cane to prevent falls.  Continue to try to gain weight  Refills as needed.  Return in about 6 months (around 12/27/2021).         Subjective   SUBJECTIVE/OBJECTIVE:  HPI  70 yo male with underying h/o tongue cancer, severe weight loss, malnutrition, COPD, smoking and anxiety/ depression.    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  Following with ENT and oncology.)    He is here for check up.    Recent hospitalization for bilateral PNA likely 2/2 aspiration.  Underwent bronchoscopy.      He feels somewhat better since getting home.  Has PT and OT scheduled for next week at home.    Is having pain and muscle spasm in left leg.    Trying to gain weight.  (Up to 87 lbs)    Right ear wax build up.        Review of Systems   Constitutional:  Positive for fatigue and unexpected weight change. Negative for activity change, appetite change, chills, diaphoresis and fever.   HENT:  Positive for ear discharge, ear pain and trouble swallowing.    Respiratory: Negative.     Cardiovascular: Negative.    Gastrointestinal: Negative.         Objective    Physical Exam  Vitals and nursing note reviewed.   Constitutional:       Appearance: He is cachectic. He is not ill-appearing, toxic-appearing or diaphoretic.      Comments: At baseline   HENT:      Right Ear: There is impacted cerumen.      Left Ear: Tympanic membrane and ear canal normal.   Cardiovascular:      Rate and Rhythm: Normal rate and regular rhythm.   Pulmonary:      Effort: Pulmonary effort is normal.      Breath sounds: Normal breath sounds.   Neurological:      Mental Status: He is alert.        Vitals:    06/26/21 1411   BP: (!) 85/51   Pulse: 68   Resp: 16   Temp: 97.8 ??F (36.6 ??C)   SpO2: 98%       On this date 06/26/2021 I have spent 30 minutes reviewing previous notes, test results and face to face with the patient discussing the diagnosis and importance of compliance with the treatment plan as well as documenting on the day of the visit.      An electronic signature was used to authenticate this note.    --  [name], MD, MD

## 2021-06-26 NOTE — Progress Notes (Signed)
Nutrition F/U:  Assessment:  Pt seen during office visit w/ NP, worked in today after recent hospital discharge, scheduled to follow up with Dr. Sonny Masters at the end of August after next CT scan.  Pt remains NPO/TF dependent for estimated nutrition needs, bolus feeding 5-6 cartons/day of Nutren 1.5 - has recently been using Ensure Plus from the [facility] as he did not know he needed to call [facility] for new TF supply, also receives supplies for his MIC-KEY button from [facility].  Pt continues to receive IVF at home - 1 L NS every 2-3 days via PICC line, pt does not feel that he can go without these fluids, becomes dehydrated despite what is presumed adequate intake via PEG, but agreeable to PORT placement so this can continue at home per [facility] - will stop home health services when able.  Pt is to go back to [facility] in September to discuss Edwina Barth surgery - pt and caregiver asking about Dr. Coralyn Mark Day who did his original surgery, would like 2nd opinion from him - referral made to [facility], [place] navigator emailed, and records faxed.  Pt continues to smoke, consuming water via 60 ml syringe po, wt remains relatively stable but no significant weight gain. Current BW: 87#, down 2# from UBW range (89-93#).      Intervention:  1. Continue w/ water via syringe po   2. Continue w/ home health ([facility]) - PT/OT/RN/SLP.   Home IVF from [facility], Home TF supplies from [facility]  3. Referral to IR for port placement - PICC line to be removed post placement, leave accessed for home IVF.   4. Referral to [facility] in [place] - Dr. Coralyn Mark Day; RN navigator contacted and records faxed  5. Encouraged smoking cessation for anticipated surgery in the future   6. CT scan scheduled in August - follow up with Dr. Sonny Masters post scan.     Monitoring/Evaluation:  1. RD to follow up during next office visit - follow up wt status, tolerance/intake of TF, symptom management.      [place], [place], [place]

## 2021-06-26 NOTE — ACP (Advance Care Planning) (Signed)
Has no aCP

## 2021-06-26 NOTE — Addendum Note (Signed)
Addended by: Marcheta Grammes on: 06/26/2021 05:07 PM     Modules accepted: Orders

## 2021-06-27 ENCOUNTER — Inpatient Hospital Stay: Payer: MEDICARE | Attending: Anesthesiology | Primary: Family Medicine

## 2021-06-27 NOTE — Telephone Encounter (Signed)
Info clarified w/ phone operator [place].

## 2021-06-27 NOTE — Telephone Encounter (Signed)
Jessica @ [facility] calling to request all of pt's medical records.  Directed Janett Billow to reach out to [facility] for medical records   P: [phone]  F: [phone]   Numbers listed above given to staff member.  Informed that they will need to fax ROI.

## 2021-06-27 NOTE — Telephone Encounter (Addendum)
[facility] called requesting clarification on an order for the pts pick line  dressing be changed weekly. Stated she called the pt and was told the port has not been put in yet.

## 2021-06-29 ENCOUNTER — Inpatient Hospital Stay: Payer: MEDICARE | Primary: Family Medicine

## 2021-06-29 ENCOUNTER — Inpatient Hospital Stay: Admit: 2021-06-29 | Payer: MEDICARE | Attending: Anesthesiology | Primary: Family Medicine

## 2021-06-29 ENCOUNTER — Inpatient Hospital Stay: Admit: 2021-06-29 | Payer: MEDICARE | Primary: Family Medicine

## 2021-06-29 DIAGNOSIS — Z452 Encounter for adjustment and management of vascular access device: Secondary | ICD-10-CM

## 2021-06-29 MED ORDER — LACTATED RINGERS IV SOLN
INTRAVENOUS | Status: DC | PRN
Start: 2021-06-29 — End: 2021-06-29
  Administered 2021-06-29: 14:00:00 via INTRAVENOUS

## 2021-06-29 MED ORDER — LIDOCAINE-EPINEPHRINE 1 %-1:100000 IJ SOLN
1 %-:00000 | Freq: Once | INTRAMUSCULAR | Status: AC | PRN
Start: 2021-06-29 — End: 2021-06-29
  Administered 2021-06-29: 14:00:00 1 via INTRADERMAL

## 2021-06-29 MED ORDER — LIDOCAINE-EPINEPHRINE 1 %-1:100000 IJ SOLN
1 | INTRAMUSCULAR | Status: AC
Start: 2021-06-29 — End: 2021-06-29

## 2021-06-29 MED ORDER — CEFAZOLIN SODIUM 1 G IJ SOLR
1 g | INTRAMUSCULAR | Status: DC | PRN
Start: 2021-06-29 — End: 2021-06-29
  Administered 2021-06-29: 14:00:00 2000 via INTRAVENOUS

## 2021-06-29 MED ORDER — LIDOCAINE HCL (PF) 2 % IJ SOLN
2 % | INTRAMUSCULAR | Status: DC | PRN
Start: 2021-06-29 — End: 2021-06-29
  Administered 2021-06-29: 14:00:00 20 via INTRAVENOUS

## 2021-06-29 MED ORDER — LIDOCAINE-EPINEPHRINE 1 %-1:100000 IJ SOLN
1 %-:00000 | Freq: Once | INTRAMUSCULAR | Status: AC | PRN
Start: 2021-06-29 — End: 2021-06-29
  Administered 2021-06-29: 14:00:00 8 via INTRADERMAL

## 2021-06-29 MED ORDER — CEFAZOLIN 2000 MG IN 20 ML SWFI IV SYRINGE (PREMIX)
Status: AC
Start: 2021-06-29 — End: 2021-06-29

## 2021-06-29 MED ORDER — HEPARIN SOD (PORK) LOCK FLUSH 100 UNIT/ML IV SOLN
100 | INTRAVENOUS | Status: AC
Start: 2021-06-29 — End: 2021-06-29

## 2021-06-29 MED ORDER — PROPOFOL 200 MG/20ML IV EMUL
20020 MG/20ML | INTRAVENOUS | Status: DC | PRN
Start: 2021-06-29 — End: 2021-06-29
  Administered 2021-06-29 (×2): 20 via INTRAVENOUS
  Administered 2021-06-29: 14:00:00 100 via INTRAVENOUS

## 2021-06-29 MED FILL — CEFAZOLIN 2000 MG IN 20 ML SWFI IV SYRINGE (PREMIX): Qty: 2000

## 2021-06-29 MED FILL — HEPARIN SOD (PORK) LOCK FLUSH 100 UNIT/ML IV SOLN: 100 [IU]/mL | INTRAVENOUS | Qty: 5

## 2021-06-29 MED FILL — LIDOCAINE-EPINEPHRINE 1 %-1:100000 IJ SOLN: 1 %-:00000 | INTRAMUSCULAR | Qty: 20

## 2021-06-29 NOTE — OR Nursing (Signed)
Patient in IR [address] for procedure.  Anesthesia has assumed care of patient for the duration of procedure.  Please see anesthesia record for documentation.

## 2021-06-29 NOTE — H&P (Signed)
Department of Interventional Radiology  [phone]    History and Physical    Patient:  Caleb Robinson MRN:  [id number]  SSN:  [id number]    Date of Birth:  [birth year]  Age:  70 y.o.  Sex:  male      Primary Care Provider:  [name], MD, MD  Referring Physician:  Kerby Nora, APRN*    Subjective:     Chief Complaint: tongue cancer    History of the Present Illness:  The patient is a 70 y.o. male who presents for venous chest port placement. Emphysema, tobacco abuse.  NPO.        Past Medical History:   Diagnosis Date    Chronic obstructive pulmonary disease ([place])     follows with Pulmonology.  daily and rescure inhaler. no home O2.    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    Prior to Admission medications    Medication Sig Start Date End Date Taking? Authorizing Provider   budesonide (PULMICORT) 0.5 MG/2ML nebulizer suspension Take 2 mLs by nebulization in the morning and 2 mLs before bedtime. 06/26/21   [name], MD   ipratropium-albuterol (DUONEB) 0.5-2.5 (3) MG/3ML SOLN nebulizer solution Inhale 3 mLs into the lungs every 6 hours as needed for Shortness of Breath 06/26/21   [name], MD   oxyCODONE HCl (OXY-IR) 10 MG immediate release tablet Take 1 tablet by mouth every 6 hours as needed for Pain for up to 12 days. 06/22/21 07/04/21  [name] Crigler, APRN - CNP   sertraline (ZOLOFT) 20 MG/ML concentrated solution Take 2.5 mLs by mouth daily 2.5 mL DAILY (route: oral) 05/28/21   [name], MD   ibuprofen (ADVIL;MOTRIN) 800 MG tablet Take 1 tablet by mouth twice daily as needed for pain 05/25/21   Thereasa Distance, MD   albuterol sulfate HFA 108 (90 Base) MCG/ACT inhaler Inhale 2 puffs into the lungs every 6 hours as  needed 02/14/21   Ar Automatic Reconciliation   chlorhexidine (PERIDEX) 0.12 % solution 10 mL 4 TIMES DAILY (route: mucous membrane) 10/14/20   Ar Automatic Reconciliation   vitamin D 25 MCG (1000 UT) CAPS Take by mouth daily    Ar Automatic Reconciliation   diphenoxylate-atropine (LOMOTIL) 2.5-0.025 MG per tablet Take 1 tablet by mouth 4 times daily as needed. As needed 01/23/21   Ar Automatic Reconciliation   fluticasone (FLONASE) 50 MCG/ACT nasal spray 2 sprays by Nasal route daily 03/26/21   Ar Automatic Reconciliation   fluticasone-umeclidin-vilant (TRELEGY ELLIPTA) 100-62.5-25 MCG/INH AEPB 2 inhalation DAILY (route: inhalation) 08/21/20   Ar Automatic Reconciliation   gabapentin (NEURONTIN) 100 MG capsule Take 200 mg by mouth 3 times daily as needed. 02/06/21   Ar Automatic Reconciliation   LORazepam (ATIVAN) 0.5 MG tablet Take 0.5 mg by mouth in the morning. 01/30/21   Ar Automatic Reconciliation        Allergies   Allergen Reactions    Cat Hair Extract Swelling       Family History   Problem Relation Age of Onset    Dementia Mother  Social History     Tobacco Use    Smoking status: Every Day     Packs/day: 0.75     Years: 30.00     Pack years: 22.50     Types: Cigarettes    Smokeless tobacco: Never    Tobacco comments:     Quit smoking: stopped but restarted Dec 2019   Substance Use Topics    Alcohol use: Yes     Alcohol/week: 1.0 standard drink        Not in a hospital admission.    Objective:       Physical Examination:    There were no vitals filed for this visit.    Pain Assessment                                                     HEART: regular rate and rhythm  LUNG: coarse bilaterally  ABDOMEN: oft, scaphoid  EXTREMITIES: warm, wasted    Laboratory:     Lab Results   Component Value Date/Time    NA 140 06/25/2021 02:21 PM    NA 141 06/04/2021 08:08 AM    K 4.7 06/25/2021 02:21 PM    K 3.7 06/04/2021 08:08 AM    CL 106 06/25/2021 02:21 PM    CL 105 06/04/2021 08:08 AM    CO2 29 06/25/2021 02:21 PM    CO2  31 06/04/2021 08:08 AM    BUN 36 06/25/2021 02:21 PM    BUN 18 06/04/2021 08:08 AM    GFRAA >60 06/25/2021 02:21 PM    GFRAA >60 06/04/2021 08:08 AM    MG 1.7 06/04/2021 08:08 AM    MG 1.8 06/02/2021 11:36 AM    PHOS 2.4 06/04/2021 08:08 AM    PHOS 2.6 06/03/2021 06:36 AM    GLOB 3.1 06/25/2021 02:21 PM    GLOB 3.2 05/30/2021 03:35 AM    GLOB 3.3 05/30/2021 03:35 AM    ALT 43 06/25/2021 02:21 PM    ALT 22 05/30/2021 03:35 AM    ALT 22 05/30/2021 03:35 AM     Lab Results   Component Value Date/Time    WBC 4.9 06/25/2021 02:21 PM    WBC 8.0 06/02/2021 07:28 AM    HGB 9.9 06/25/2021 02:21 PM    HGB 8.8 06/02/2021 07:28 AM    HCT 32.3 06/25/2021 02:21 PM    HCT 28.4 06/02/2021 07:28 AM    PLT 181 06/25/2021 02:21 PM    PLT 260 06/02/2021 07:28 AM     No results found for: APTT, INR    Assessment:     Tongue cancer    '@HPROB'$ @    Plan:     Planned Procedure:  port placement    Risks, benefits, and alternatives reviewed with patient and he agrees to proceed with the procedure.      Signed By: Oval Linsey, PA-C     June 29, 2021

## 2021-06-29 NOTE — Other (Signed)
TRANSFER - OUT REPORT:    Verbal report given to Quillian Quince, RN on Caleb Robinson  being transferred to IR room6 for routine post-op       Report consisted of patient???s Situation, Background, Assessment and   Recommendations(SBAR).     Information from the following report(s) Index, Surgery Report, Intake/Output, and MAR was reviewed with the receiving nurse.    Lines:   PICC Single Lumen 05/30/21 Right Brachial (Active)       Single Lumen Implantable Port 05/23/21 Right Subclavian (Active)        Opportunity for questions and clarification was provided.      Patient transported with:   Transport    VTE prophylaxis orders have been written for [name].    Patient and family given floor number and nurses name.  Family updated re: pt status after security code verified.

## 2021-06-29 NOTE — Anesthesia Pre-Procedure Evaluation (Signed)
Department of Anesthesiology  Preprocedure Note       Name:  Caleb Robinson   Age:  70 y.o.  DOB:  [birth year]                                          MRN:  [id number]         Date:  06/29/2021      Surgeon: * No surgeons listed *    Procedure: * No procedures listed *    Medications prior to admission:   Prior to Admission medications    Medication Sig Start Date End Date Taking? Authorizing Provider   budesonide (PULMICORT) 0.5 MG/2ML nebulizer suspension Take 2 mLs by nebulization in the morning and 2 mLs before bedtime. 06/26/21   [name], MD   ipratropium-albuterol (DUONEB) 0.5-2.5 (3) MG/3ML SOLN nebulizer solution Inhale 3 mLs into the lungs every 6 hours as needed for Shortness of Breath 06/26/21   [name], MD   oxyCODONE HCl (OXY-IR) 10 MG immediate release tablet Take 1 tablet by mouth every 6 hours as needed for Pain for up to 12 days. 06/22/21 07/04/21  [name] Crigler, APRN - CNP   sertraline (ZOLOFT) 20 MG/ML concentrated solution Take 2.5 mLs by mouth daily 2.5 mL DAILY (route: oral) 05/28/21   [name], MD   ibuprofen (ADVIL;MOTRIN) 800 MG tablet Take 1 tablet by mouth twice daily as needed for pain 05/25/21   Thereasa Distance, MD   albuterol sulfate HFA 108 (90 Base) MCG/ACT inhaler Inhale 2 puffs into the lungs every 6 hours as needed 02/14/21   Ar Automatic Reconciliation   chlorhexidine (PERIDEX) 0.12 % solution 10 mL 4 TIMES DAILY (route: mucous membrane) 10/14/20   Ar Automatic Reconciliation   vitamin D 25 MCG (1000 UT) CAPS Take by mouth daily    Ar Automatic Reconciliation   diphenoxylate-atropine (LOMOTIL) 2.5-0.025 MG per tablet Take 1 tablet by mouth 4 times daily as needed. As needed 01/23/21   Ar Automatic Reconciliation   fluticasone (FLONASE) 50 MCG/ACT nasal spray 2 sprays by Nasal route daily 03/26/21   Ar Automatic Reconciliation   fluticasone-umeclidin-vilant (TRELEGY ELLIPTA) 100-62.5-25 MCG/INH AEPB 2 inhalation DAILY (route: inhalation) 08/21/20   Ar Automatic  Reconciliation   gabapentin (NEURONTIN) 100 MG capsule Take 200 mg by mouth 3 times daily as needed. 02/06/21   Ar Automatic Reconciliation   LORazepam (ATIVAN) 0.5 MG tablet Take 0.5 mg by mouth in the morning. 01/30/21   Ar Automatic Reconciliation       Current medications:    Current Outpatient Medications   Medication Sig Dispense Refill   ??? budesonide (PULMICORT) 0.5 MG/2ML nebulizer suspension Take 2 mLs by nebulization in the morning and 2 mLs before bedtime. 60 each 3   ??? ipratropium-albuterol (DUONEB) 0.5-2.5 (3) MG/3ML SOLN nebulizer solution Inhale 3 mLs into the lungs every 6 hours as needed for Shortness of Breath 360 mL 3   ??? oxyCODONE HCl (OXY-IR) 10 MG immediate release tablet Take 1 tablet by mouth every 6 hours as needed for Pain for up to 12 days. 48 tablet 0   ??? sertraline (ZOLOFT) 20 MG/ML concentrated solution Take 2.5 mLs by mouth daily 2.5 mL DAILY (route: oral) 75 mL 5   ??? ibuprofen (ADVIL;MOTRIN) 800 MG tablet Take 1 tablet by mouth twice daily as needed for pain 60 tablet 0   ???  albuterol sulfate HFA 108 (90 Base) MCG/ACT inhaler Inhale 2 puffs into the lungs every 6 hours as needed     ??? chlorhexidine (PERIDEX) 0.12 % solution 10 mL 4 TIMES DAILY (route: mucous membrane)     ??? vitamin D 25 MCG (1000 UT) CAPS Take by mouth daily     ??? diphenoxylate-atropine (LOMOTIL) 2.5-0.025 MG per tablet Take 1 tablet by mouth 4 times daily as needed. As needed     ??? fluticasone (FLONASE) 50 MCG/ACT nasal spray 2 sprays by Nasal route daily     ??? fluticasone-umeclidin-vilant (TRELEGY ELLIPTA) 100-62.5-25 MCG/INH AEPB 2 inhalation DAILY (route: inhalation)     ??? gabapentin (NEURONTIN) 100 MG capsule Take 200 mg by mouth 3 times daily as needed.     ??? LORazepam (ATIVAN) 0.5 MG tablet Take 0.5 mg by mouth in the morning.       No current facility-administered medications for this encounter.       Allergies:    Allergies   Allergen Reactions   ??? Cat Hair Extract Swelling       Problem List:    Patient Active  Problem List   Diagnosis Code   ??? Emphysema lung (HCC) J43.9   ??? Dehydration E86.0   ??? Vision loss H54.7   ??? Tongue cancer ([place]) C02.9   ??? History of tobacco abuse Z87.891   ??? Centrilobular emphysema ([place]) J43.2   ??? Unstable angina (HCC) I20.0   ??? 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 Acute respiratory failure with hypoxia (HCC) J96.01   ??? Hyperlipidemia E78.5   ??? Anxiety and depression F41.9, F32.A   ??? COPD with acute lower respiratory infection ([place]) J44.0   ??? Aspiration pneumonia (HCC) J69.0   ??? Severe protein-calorie malnutrition ([place]) E43   ??? Acute respiratory failure with hypoxemia (HCC) J96.01   ??? Anemia D64.9   ??? Pneumonia of both lower lobes due to infectious organism J18.9   ??? MRSA (methicillin resistant Staphylococcus aureus) infection A49.02   ??? Oral phase dysphagia R13.11   ??? 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 Localized swelling of right upper extremity R22.31   ??? Right ear impacted cerumen H61.21   ??? Contracture of muscle, left thigh M62.452       Past Medical History:        Diagnosis Date   ??? Chronic obstructive pulmonary disease ([place])     follows with Pulmonology.  daily and rescure inhaler. no home O2.   ??? Emphysema lung ([place])    ??? HTN (hypertension)    ??? 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 BRONCHOSCOPY N/A 05/30/2021    BRONCHOSCOPY performed by Rogue Bussing, MD at [facility] ENDOSCOPY   ??? HERNIA REPAIR  05/2019       Social History:    Social History     Tobacco Use   ??? Smoking status: Every Day     Packs/day: 0.75     Years: 30.00     Pack years: 22.50     Types: Cigarettes   ??? Smokeless tobacco: Never   ??? Tobacco comments:     Quit smoking: stopped but restarted Dec 2019   Substance Use Topics   ??? Alcohol use: Yes     Alcohol/week: 1.0 standard drink  Ready to quit: Not Answered  Counseling given: Not Answered  Tobacco comments: Quit smoking: stopped but restarted Dec 2019      Vital Signs (Current):   Vitals:    06/29/21 0759    BP: (!) 113/57   Pulse: 58   Resp: 16   Temp: 97.7 ??F (36.5 ??C)   TempSrc: Axillary                                              BP Readings from Last 3 Encounters:   06/29/21 (!) 113/57   06/26/21 (!) 85/51   06/25/21 (!) 108/59       NPO Status: Time of last liquid consumption: 1730                                                 Date of last liquid consumption: 06/28/21                             BMI:   Wt Readings from Last 3 Encounters:   06/26/21 87 lb 12.8 oz (39.8 kg)   06/25/21 87 lb 11.2 oz (39.8 kg)   06/04/21 90 lb 1.6 oz (40.9 kg)     There is no height or weight on file to calculate BMI.    CBC:   Lab Results   Component Value Date/Time    WBC 4.9 06/25/2021 02:21 PM    RBC 3.22 06/25/2021 02:21 PM    HGB 9.9 06/25/2021 02:21 PM    HCT 32.3 06/25/2021 02:21 PM    MCV 100.3 06/25/2021 02:21 PM    RDW 16.9 06/25/2021 02:21 PM    PLT 181 06/25/2021 02:21 PM       CMP:   Lab Results   Component Value Date/Time    NA 140 06/25/2021 02:21 PM    K 4.7 06/25/2021 02:21 PM    CL 106 06/25/2021 02:21 PM    CO2 29 06/25/2021 02:21 PM    BUN 36 06/25/2021 02:21 PM    CREATININE 0.70 06/25/2021 02:21 PM    GFRAA >60 06/25/2021 02:21 PM    AGRATIO 0.9 04/13/2021 05:25 PM    LABGLOM >60 06/25/2021 02:21 PM    GLUCOSE 73 06/25/2021 02:21 PM    PROT 6.1 06/25/2021 02:21 PM    CALCIUM 9.5 06/25/2021 02:21 PM    BILITOT 0.3 06/25/2021 02:21 PM    ALKPHOS 77 06/25/2021 02:21 PM    ALKPHOS 96 04/13/2021 05:25 PM    AST 42 06/25/2021 02:21 PM    ALT 43 06/25/2021 02:21 PM       POC Tests: No results for input(s): POCGLU, POCNA, POCK, POCCL, POCBUN, POCHEMO, POCHCT in the last 72 hours.    Coags: No results found for: PROTIME, INR, APTT    HCG (If Applicable): No results found for: PREGTESTUR, PREGSERUM, HCG, HCGQUANT     ABGs: No results found for: PHART, PO2ART, PCO2ART, HCO3ART, BEART, O2SATART     Type & Screen (If Applicable):  No results found for: LABABO, LABRH    Drug/Infectious Status (If Applicable):  No results  found for: HIV, HEPCAB    COVID-19 Screening (If Applicable):   Lab  Results   Component Value Date/Time    COVID19 NOT DETECTED 05/29/2021 10:47 PM           Anesthesia Evaluation  Patient summary reviewed and Nursing notes reviewed  Airway: Mallampati: IV  TM distance: <3 FB   Neck ROM: full  Mouth opening: > = 3 FB   Dental:    (+) edentulous      Pulmonary: breath sounds clear to auscultation  (+) pneumonia (Aspiration pneumonia 6/22):  COPD:                             Cardiovascular:  Exercise tolerance: poor (<4 METS),           Rhythm: regular  Rate: normal                    Neuro/Psych:   (+) psychiatric history:            GI/Hepatic/Renal: Neg GI/Hepatic/Renal ROS            Endo/Other:    (+) malignancy/cancer (s/p Radical neck dissection, partial glossectomy 10/21).                 Abdominal:             Vascular:          Other Findings:           Anesthesia Plan      TIVA     ASA 3       Induction: intravenous.      Anesthetic plan and risks discussed with patient and spouse.                        Judeth Cornfield, MD   06/29/2021

## 2021-06-29 NOTE — Anesthesia Post-Procedure Evaluation (Signed)
Department of Anesthesiology  Postprocedure Note    Patient: Caleb Robinson  MRN: [id number]  Birthdate: 1951/07/02  Date of evaluation: 06/29/2021      Procedure Summary     Date: 06/29/21 Room / Location: [place] [facility]    Anesthesia Start: [phone] Anesthesia Stop: [id number]    Procedures:       IR PORT PLACEMENT EQUAL OR GREATER THAN 5 YEARS      IR PORT PLACEMENT EQUAL OR GREATER THAN 5 YEARS Diagnosis:       Tongue cancer ([place])      On enteral nutrition at home      Dehydration      (PORT)      (Tongue Cancer)    Scheduled Providers: Judeth Cornfield, MD Responsible Provider: Judeth Cornfield, MD    Anesthesia Type: TIVA ASA Status: 3          Anesthesia Type: No value filed.    Aldrete Phase I: Aldrete Score: 10    Aldrete Phase II:        Anesthesia Post Evaluation    Patient location during evaluation: PACU  Patient participation: complete - patient participated  Level of consciousness: awake and alert  Airway patency: patent  Nausea & Vomiting: no nausea and no vomiting  Complications: no  Cardiovascular status: hemodynamically stable  Respiratory status: acceptable  Hydration status: euvolemic  Comments: Blood pressure 124/60, pulse 60, resp. rate 15, SpO2 97 %.      Pt stable for discharge from PACU  Multimodal analgesia pain management approach

## 2021-06-29 NOTE — OR Nursing (Signed)
Pt transported to PACU via stretcher with RN and CRNA.  Report to [facility], RN in PACU. All questions answered.  Spouse updated in room.

## 2021-06-29 NOTE — Op Note (Signed)
Department of Interventional Radiology  [phone]        Interventional Radiology Brief Procedure Note    Patient: Caleb Robinson MRN: [id number]  SSN: [id number]    Date of Birth: [birth year]  Age: 70 y.o.  Sex: male      Date of Procedure: 06/29/2021    Pre-Procedure Diagnosis: tongue cancer    Post-Procedure Diagnosis: SAME    Procedure(s): Venous Chest Port Placement    Brief Description of Procedure: as above    Performed By: Oval Linsey, PA-C     Assistants: None    Anesthesia:TIVS/MAC    Estimated Blood Loss: None    Specimens:  None    Implants:  Subcutaneous Port    Findings: catheter tip in right atrium     Complications: None    Recommendations: ok to use port     Follow Up: prn    Signed By: Oval Linsey, PA-C     June 29, 2021

## 2021-06-29 NOTE — Discharge Instructions (Signed)
[facility]     Department of Interventional Radiology     Phone Number: [phone]     Fax: [phone]         If you have any questions about your procedure, please call the Interventional Radiology department at [phone].      After business hours (5pm) and weekends, call the answering service at [phone] and ask for the Radiologist on call to be paged.            Si tiene Preguntas acerca del procedimiento, por favor llame al departamento de Radiolog??a Intervencional al [phone].      Despu??s de horas de oficina (5 pm) y los fines de semana, llamar al servicio de llamadas al [phone] y pregunte por el Radiologo de guardia.

## 2021-07-03 NOTE — Care Coordination-Inpatient (Signed)
Ambulatory Care Coordination Note  07/03/2021    ACC: Tilford Pillar, RN    Summary Note:     Discussed appt 07/11/21 [facility] surgeon   Reconstruct bottom jaw for dentures so can eat. HHC once/wk, RN, PT/OT.  Pt using O2 1ltrs after asp pn.     Care Coordination Interventions    Referral from Primary Care Provider: No  Suggested Interventions and [place]: In Process (Comment: [facility])          Goals Addressed                   This Visit's Progress     Conditions and Symptoms   On track     I will schedule office visits, as directed by my provider.  I will keep my appointment or reschedule if I have to cancel.  I will notify my provider of any barriers to my plan of care.  I will follow my Zone Management tool to seek urgent or emergent care.  I will notify my provider of any symptoms that indicate a worsening of my condition.    Barriers: none  Plan for overcoming my barriers: N/A  Confidence: 10/10  Anticipated Goal Completion Date: 07/25/21                  Prior to Admission medications    Medication Sig Start Date End Date Taking? Authorizing Provider   budesonide (PULMICORT) 0.5 MG/2ML nebulizer suspension Take 2 mLs by nebulization in the morning and 2 mLs before bedtime. 06/26/21   [name], MD   ipratropium-albuterol (DUONEB) 0.5-2.5 (3) MG/3ML SOLN nebulizer solution Inhale 3 mLs into the lungs every 6 hours as needed for Shortness of Breath 06/26/21   [name], MD   oxyCODONE HCl (OXY-IR) 10 MG immediate release tablet Take 1 tablet by mouth every 6 hours as needed for Pain for up to 12 days. 06/22/21 07/04/21  [name] Crigler, APRN - CNP   sertraline (ZOLOFT) 20 MG/ML concentrated solution Take 2.5 mLs by mouth daily 2.5 mL DAILY (route: oral) 05/28/21   [name], MD   ibuprofen (ADVIL;MOTRIN) 800 MG tablet Take 1 tablet by mouth twice daily as needed for pain 05/25/21   Thereasa Distance, MD   albuterol sulfate HFA 108 (90 Base) MCG/ACT inhaler Inhale 2 puffs into the lungs  every 6 hours as needed 02/14/21   Ar Automatic Reconciliation   chlorhexidine (PERIDEX) 0.12 % solution 10 mL 4 TIMES DAILY (route: mucous membrane) 10/14/20   Ar Automatic Reconciliation   vitamin D 25 MCG (1000 UT) CAPS Take by mouth daily    Ar Automatic Reconciliation   diphenoxylate-atropine (LOMOTIL) 2.5-0.025 MG per tablet Take 1 tablet by mouth 4 times daily as needed. As needed 01/23/21   Ar Automatic Reconciliation   fluticasone (FLONASE) 50 MCG/ACT nasal spray 2 sprays by Nasal route daily 03/26/21   Ar Automatic Reconciliation   fluticasone-umeclidin-vilant (TRELEGY ELLIPTA) 100-62.5-25 MCG/INH AEPB 2 inhalation DAILY (route: inhalation) 08/21/20   Ar Automatic Reconciliation   gabapentin (NEURONTIN) 100 MG capsule Take 200 mg by mouth 3 times daily as needed. 02/06/21   Ar Automatic Reconciliation   LORazepam (ATIVAN) 0.5 MG tablet Take 0.5 mg by mouth in the morning. 01/30/21   Ar Automatic Reconciliation       Future Appointments   Date Time Provider [place]   07/19/2021 11:20 AM Shella Spearing, MD [facility] AM[facility]   08/03/2021  0000000 PM SFD CT [address] [facility]   08/07/2021 10:10 AM [place] [facility]   08/07/2021 10:45 AM Rayann Heman, MD [facility] AM[facility]   08/07/2021 10:45 AM Carron Brazen Garey Ham, RD [facility] AMB   08/09/2021  1:00 PM Osa Craver, MD [facility]   12/31/2021  2:15 PM [name], MD [facility] AMB    and   General Assessment    Do you have any symptoms that are causing concern?: No

## 2021-07-03 NOTE — Telephone Encounter (Signed)
[facility] # [id number]

## 2021-07-03 NOTE — Care Coordination-Inpatient (Signed)
RNCM attempted to reach pt, no answer left HIPAA compliant vm.  Will continue attempts to reach pt.

## 2021-07-09 ENCOUNTER — Ambulatory Visit: Payer: MEDICARE | Primary: Family Medicine

## 2021-07-09 MED ORDER — IBUPROFEN 800 MG PO TABS
800 MG | ORAL_TABLET | ORAL | 0 refills | Status: AC
Start: 2021-07-09 — End: ?

## 2021-07-09 MED ORDER — ROSUVASTATIN CALCIUM 10 MG PO TABS
10 MG | ORAL_TABLET | ORAL | 0 refills | Status: AC
Start: 2021-07-09 — End: ?

## 2021-07-10 ENCOUNTER — Emergency Department: Admit: 2021-07-10 | Payer: MEDICARE | Primary: Family Medicine

## 2021-07-10 ENCOUNTER — Inpatient Hospital Stay
Admit: 2021-07-10 | Discharge: 2021-07-10 | Disposition: A | Discharge status: Home / Self Care | Payer: MEDICARE | Attending: Emergency Medicine

## 2021-07-10 ENCOUNTER — Inpatient Hospital Stay: Admit: 2021-07-10 | Payer: MEDICARE | Primary: Family Medicine

## 2021-07-10 DIAGNOSIS — L03211 Cellulitis of face: Secondary | ICD-10-CM

## 2021-07-10 LAB — CBC WITH AUTO DIFFERENTIAL
Absolute Eos #: 0 10*3/uL (ref 0.0–0.8)
Absolute Immature Granulocyte: 0 10*3/uL (ref 0.0–0.5)
Absolute Lymph #: 0.6 10*3/uL (ref 0.5–4.6)
Absolute Mono #: 0.5 10*3/uL (ref 0.1–1.3)
Basophils Absolute: 0.1 10*3/uL (ref 0.0–0.2)
Basophils: 1 % (ref 0.0–2.0)
Eosinophils %: 0 % — ABNORMAL LOW (ref 0.5–7.8)
Hematocrit: 30.6 % — ABNORMAL LOW (ref 41.1–50.3)
Hemoglobin: 9.3 g/dL — ABNORMAL LOW (ref 13.6–17.2)
Immature Granulocytes: 0 % (ref 0.0–5.0)
Lymphocytes: 7 % — ABNORMAL LOW (ref 13–44)
MCH: 31 PG (ref 26.1–32.9)
MCHC: 30.4 g/dL — ABNORMAL LOW (ref 31.4–35.0)
MCV: 102 FL — ABNORMAL HIGH (ref 79.6–97.8)
MPV: 11.6 FL (ref 9.4–12.3)
Monocytes: 6 % (ref 4.0–12.0)
Platelets: 198 10*3/uL (ref 150–450)
RBC: 3 M/uL — ABNORMAL LOW (ref 4.23–5.6)
RDW: 17.7 % — ABNORMAL HIGH (ref 11.9–14.6)
Seg Neutrophils: 86 % — ABNORMAL HIGH (ref 43–78)
Segs Absolute: 6.8 10*3/uL (ref 1.7–8.2)
WBC: 8 10*3/uL (ref 4.3–11.1)
nRBC: 0 10*3/uL (ref 0.0–0.2)

## 2021-07-10 LAB — COMPREHENSIVE METABOLIC PANEL
ALT: 22 U/L (ref 12–65)
AST: 23 U/L (ref 15–37)
Albumin/Globulin Ratio: 1 — ABNORMAL LOW (ref 1.2–3.5)
Albumin: 3 g/dL — ABNORMAL LOW (ref 3.2–4.6)
Alk Phosphatase: 80 U/L (ref 50–136)
Anion Gap: 6 mmol/L — ABNORMAL LOW (ref 7–16)
BUN: 28 MG/DL — ABNORMAL HIGH (ref 8–23)
CO2: 27 mmol/L (ref 21–32)
Calcium: 8.9 MG/DL (ref 8.3–10.4)
Chloride: 108 mmol/L — ABNORMAL HIGH (ref 98–107)
Creatinine: 0.6 MG/DL — ABNORMAL LOW (ref 0.8–1.5)
GFR African American: >60 mL/min/{1.73_m2} (ref >60)
GFR Non-African American: >60 mL/min/{1.73_m2} (ref >60)
Globulin: 3 g/dL (ref 2.3–3.5)
Glucose: 102 mg/dL — ABNORMAL HIGH (ref 65–100)
Potassium: 4.8 mmol/L (ref 3.5–5.1)
Sodium: 141 mmol/L (ref 136–145)
Total Bilirubin: 0.3 MG/DL (ref 0.2–1.1)
Total Protein: 6 g/dL — ABNORMAL LOW (ref 6.3–8.2)

## 2021-07-10 LAB — LACTIC ACID: Lactic Acid, Plasma: 0.7 MMOL/L (ref 0.4–2.0)

## 2021-07-10 MED ORDER — SODIUM CHLORIDE 0.9 % IV SOLN ADMIXTURE
0.9 % | INTRAVENOUS | Status: DC
Start: 2021-07-10 — End: 2021-07-10
  Administered 2021-07-10: 22:00:00 1000 mg via INTRAVENOUS

## 2021-07-10 MED ORDER — HYDROCODONE-ACETAMINOPHEN 5-325 MG PO TABS
5-325 MG | ORAL_TABLET | Freq: Three times a day (TID) | ORAL | 0 refills | Status: AC | PRN
Start: 2021-07-10 — End: 2021-07-12

## 2021-07-10 MED ORDER — SODIUM CHLORIDE 0.9 % IV BOLUS
0.9 % | Freq: Once | INTRAVENOUS | Status: AC | PRN
Start: 2021-07-10 — End: 2021-07-10
  Administered 2021-07-10: 21:00:00 100 mL via INTRAVENOUS

## 2021-07-10 MED ORDER — IOPAMIDOL 76 % IV SOLN
76 % | Freq: Once | INTRAVENOUS | Status: AC | PRN
Start: 2021-07-10 — End: 2021-07-10
  Administered 2021-07-10: 21:00:00 100 mL via INTRAVENOUS

## 2021-07-10 MED ORDER — DOXYCYCLINE HYCLATE 100 MG PO TABS
100 MG | ORAL_TABLET | Freq: Two times a day (BID) | ORAL | 0 refills | Status: AC
Start: 2021-07-10 — End: 2021-07-17

## 2021-07-10 MED ORDER — NORMAL SALINE FLUSH 0.9 % IV SOLN
0.9 % | Freq: Once | INTRAVENOUS | Status: AC | PRN
Start: 2021-07-10 — End: 2021-07-10
  Administered 2021-07-10: 21:00:00 10 mL via INTRAVENOUS

## 2021-07-10 MED FILL — CEFTRIAXONE SODIUM 1 G IJ SOLR: 1 g | INTRAMUSCULAR | Qty: 1000

## 2021-07-10 NOTE — ED Triage Notes (Addendum)
Pt arrives via [facility] to triage. States onset of right sided jaw swelling overnight. Subjective fevers. Hx of tongue and oral cancer. Does not have any teeth. Nad. Masked     Wants port accessed

## 2021-07-10 NOTE — ED Provider Notes (Signed)
[facility] Emergency Department Provider Note                   PCP:                [name], MD, MD               Age: 70 y.o.      Sex: male     No diagnosis found.    DISPOSITION          MDM  Number of Diagnoses or Management Options  Diagnosis management comments: Blood work shows stable anemia, normal white blood count and lactic acid.  Chest x-ray shows persistent opacity right mid lung base possibly due to focal infiltrate or loculated effusion.  Trace bilateral effusions.  CT maxillofacial shows stable postoperative changes without new destructive changes or masses or fluid collection.  Patient treated with Rocephin for possible cellulitis.  Will discharge home on doxycycline.       Amount and/or Complexity of Data Reviewed  Clinical lab tests: ordered and reviewed  Tests in the radiology section of CPT??: ordered and reviewed  Tests in the medicine section of CPT??: ordered and reviewed  Independent visualization of images, tracings, or specimens: yes    Risk of Complications, Morbidity, and/or Mortality  Presenting problems: moderate  Diagnostic procedures: moderate  Management options: moderate         Orders Placed This Encounter   Procedures   ??? Culture, Blood 1   ??? Culture, Blood 1   ??? XR CHEST PORTABLE   ??? CT MAXILLOFACIAL W CONTRAST   ??? Lactic Acid   ??? CBC with Auto Differential   ??? CMP   ??? Procalcitonin   ??? Cardiac Monitor - ED Only   ??? Saline lock IV        Caleb Robinson is a 70 y.o. male who presents to the Emergency Department with chief complaint of    Chief Complaint   Patient presents with   ??? 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  Had a bone graft from femur placed.  Presents today with swelling to the right side of his jaw onset yesterday.  Symptoms worse this morning.  Has had fever up to 102.  No nausea or vomiting.  No difficulty breathing.    The history is provided by the patient.        Review of Systems   Constitutional:  Positive for fever.   Respiratory:  Positive for cough. Negative for shortness of breath.    Cardiovascular:  Negative for chest pain.   Gastrointestinal:  Negative for vomiting.   Neurological:  Negative for headaches.   All other systems reviewed and are negative.    Past Medical History:   Diagnosis Date   ??? Chronic obstructive pulmonary disease ([place])     follows with Pulmonology.  daily and rescure inhaler. no home O2.   ??? Emphysema lung ([place])    ??? HTN (hypertension)    ??? 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 BRONCHOSCOPY N/A 05/30/2021    BRONCHOSCOPY performed by Rogue Bussing, MD at [facility] ENDOSCOPY   ??? HERNIA REPAIR  05/2019   ??? IR PORT PLACEMENT EQUAL OR GREATER THAN 5 YEARS  06/29/2021    IR PORT PLACEMENT  EQUAL OR GREATER THAN 5 YEARS 06/29/2021 [facility] RADIOLOGY SPECIALS        Family History   Problem Relation Age of Onset   ??? Dementia Mother         Social History     Socioeconomic History   ??? Marital status: Single   Tobacco Use   ??? Smoking status: Every Day     Packs/day: 0.75     Years: 30.00     Pack years: 22.50     Types: Cigarettes   ??? Smokeless tobacco: Never   ??? Tobacco comments:     Quit smoking: stopped but restarted Dec 2019   Vaping Use   ??? Vaping Use: Never used   Substance and Sexual Activity   ??? Alcohol use: Yes     Alcohol/week: 1.0 standard drink   ??? Drug use: Never         Cat hair extract     Previous Medications    ALBUTEROL SULFATE HFA 108 (90 BASE) MCG/ACT INHALER    Inhale 2 puffs into the lungs every 6 hours as needed    BUDESONIDE (PULMICORT) 0.5 MG/2ML NEBULIZER SUSPENSION    Take 2 mLs by nebulization in the morning and 2 mLs before bedtime.    CHLORHEXIDINE (PERIDEX) 0.12 % SOLUTION    10 mL 4 TIMES DAILY (route: mucous membrane)    DIPHENOXYLATE-ATROPINE (LOMOTIL) 2.5-0.025 MG PER TABLET    Take 1 tablet by mouth 4 times daily as needed. As needed    FLUTICASONE (FLONASE) 50 MCG/ACT NASAL  SPRAY    2 sprays by Nasal route daily    FLUTICASONE-UMECLIDIN-VILANT (TRELEGY ELLIPTA) 100-62.5-25 MCG/INH AEPB    2 inhalation DAILY (route: inhalation)    GABAPENTIN (NEURONTIN) 100 MG CAPSULE    Take 200 mg by mouth 3 times daily as needed.    IBUPROFEN (ADVIL;MOTRIN) 800 MG TABLET    Take 1 tablet by mouth twice daily as needed for pain    IPRATROPIUM-ALBUTEROL (DUONEB) 0.5-2.5 (3) MG/3ML SOLN NEBULIZER SOLUTION    Inhale 3 mLs into the lungs every 6 hours as needed for Shortness of Breath    LORAZEPAM (ATIVAN) 0.5 MG TABLET    Take 0.5 mg by mouth in the morning.    ROSUVASTATIN (CRESTOR) 10 MG TABLET    Take 1 tablet by mouth nightly    SERTRALINE (ZOLOFT) 20 MG/ML CONCENTRATED SOLUTION    Take 2.5 mLs by mouth daily 2.5 mL DAILY (route: oral)    VITAMIN D 25 MCG (1000 UT) CAPS    Take by mouth daily        Vitals signs and nursing note reviewed.   Patient Vitals for the past 4 hrs:   Temp Pulse Resp BP SpO2   07/10/21 1617 -- 62 -- -- --   07/10/21 1615 -- -- -- (!) 115/53 99 %   07/10/21 1519 -- -- -- -- 98 %   07/10/21 1430 97.7 ??F (36.5 ??C) 73 18 (!) 116/50 92 %          Physical Exam  Vitals and nursing note reviewed.   Constitutional:       General: He is not in acute distress.     Appearance: Normal appearance. He is not toxic-appearing.   HENT:      Head: Normocephalic and atraumatic.      Nose: Nose normal.      Mouth/Throat:      Comments: Erythema and swelling to the right jaw area.  He has visible hardware in the area where his lower gingiva should be.  Posterior pharynx is partially visualized but appears normal.  Eyes:      Pupils: Pupils are equal, round, and reactive to light.   Cardiovascular:      Rate and Rhythm: Normal rate and regular rhythm.   Pulmonary:      Effort: Pulmonary effort is normal.      Breath sounds: Normal breath sounds.   Abdominal:      Palpations: Abdomen is soft.      Tenderness: There is no abdominal tenderness.   Musculoskeletal:         General: No swelling.       Cervical back: Normal range of motion and neck supple.   Skin:     General: Skin is warm and dry.   Neurological:      Mental Status: He is alert and oriented to person, place, and time.   Psychiatric:         Mood and Affect: Mood normal.         Behavior: Behavior normal.        Procedures      Labs Reviewed   CBC WITH AUTO DIFFERENTIAL - Abnormal; Notable for the following components:       Result Value    RBC 3.00 (*)     Hemoglobin 9.3 (*)     Hematocrit 30.6 (*)     MCV 102.0 (*)     MCHC 30.4 (*)     RDW 17.7 (*)     Seg Neutrophils 86 (*)     Lymphocytes 7 (*)     Eosinophils % 0 (*)     All other components within normal limits   COMPREHENSIVE METABOLIC PANEL - Abnormal; Notable for the following components:    Chloride 108 (*)     Anion Gap 6 (*)     Glucose 102 (*)     BUN 28 (*)     Creatinine 0.60 (*)     Total Protein 6.0 (*)     Albumin 3.0 (*)     Albumin/Globulin Ratio 1.0 (*)     All other components within normal limits   CULTURE, BLOOD 1   CULTURE, BLOOD 1   LACTIC ACID   PROCALCITONIN        XR CHEST PORTABLE   Final Result   1.  Aeration has improved in both lung bases. Persistence of an opacity in the   medial right lung base, which could reflect a focal infiltrate or loculated   effusion.   2.  Suspect trace bilateral pleural effusions.         CT MAXILLOFACIAL W CONTRAST    (Results Pending)                          Voice dictation software was used during the making of this note.  This software is not perfect and grammatical and other typographical errors may be present.  This note has not been completely proofread for errors.     Billee Cashing, MD  07/10/21 1806

## 2021-07-11 LAB — PROCALCITONIN: Procalcitonin: 0.18 ng/mL (ref 0.00–0.49)

## 2021-07-12 NOTE — Telephone Encounter (Signed)
[place] # [id number]

## 2021-07-12 NOTE — Telephone Encounter (Signed)
Signed, faxed, confirmation received. Placed in tray to scan

## 2021-07-12 NOTE — Telephone Encounter (Signed)
Duplicate, already signed, faxed, confirmation received. Placed in tray to scan.

## 2021-07-15 LAB — CULTURE, BLOOD 1
Culture: NO GROWTH
Culture: NO GROWTH

## 2021-07-17 NOTE — Care Coordination-Inpatient (Signed)
Ambulatory Care Coordination Note  07/17/2021    ACC: Tilford Pillar, RN    Summary Note: HHC continues, RN/PT/OT.    Discussed upcoming appts  GI Dr. Antionette Char - RN to call pt back today w/ appt.   Weight 90lbs per pt.   Normally unable to get phlegm up but was yesterday was beige.    Care Coordination Interventions    Referral from Primary Care Provider: No  Suggested Interventions and [place]: In Process (Comment: [facility])          Goals Addressed                   This Visit's Progress     Conditions and Symptoms   On track     I will schedule office visits, as directed by my provider.  I will keep my appointment or reschedule if I have to cancel.  I will notify my provider of any barriers to my plan of care.  I will follow my Zone Management tool to seek urgent or emergent care.  I will notify my provider of any symptoms that indicate a worsening of my condition.    Barriers: none  Plan for overcoming my barriers: N/A  Confidence: 10/10  Anticipated Goal Completion Date: 07/25/21                  Prior to Admission medications    Medication Sig Start Date End Date Taking? Authorizing Provider   doxycycline hyclate (VIBRA-TABS) 100 MG tablet Take 1 tablet by mouth in the morning and 1 tablet before bedtime. Do all this for 7 days. 07/10/21 07/17/21  Billee Cashing, MD   rosuvastatin (CRESTOR) 10 MG tablet Take 1 tablet by mouth nightly 07/09/21   Verdie Shire, MD   ibuprofen (ADVIL;MOTRIN) 800 MG tablet Take 1 tablet by mouth twice daily as needed for pain 07/09/21   Cleta Alberts Dangler, MD   budesonide (PULMICORT) 0.5 MG/2ML nebulizer suspension Take 2 mLs by nebulization in the morning and 2 mLs before bedtime. 06/26/21   [name], MD   ipratropium-albuterol (DUONEB) 0.5-2.5 (3) MG/3ML SOLN nebulizer solution Inhale 3 mLs into the lungs every 6 hours as needed for Shortness of Breath 06/26/21   [name], MD   sertraline (ZOLOFT) 20 MG/ML concentrated solution Take 2.5 mLs by mouth  daily 2.5 mL DAILY (route: oral) 05/28/21   [name], MD   albuterol sulfate HFA 108 (90 Base) MCG/ACT inhaler Inhale 2 puffs into the lungs every 6 hours as needed 02/14/21   Ar Automatic Reconciliation   chlorhexidine (PERIDEX) 0.12 % solution 10 mL 4 TIMES DAILY (route: mucous membrane) 10/14/20   Ar Automatic Reconciliation   vitamin D 25 MCG (1000 UT) CAPS Take by mouth daily    Ar Automatic Reconciliation   diphenoxylate-atropine (LOMOTIL) 2.5-0.025 MG per tablet Take 1 tablet by mouth 4 times daily as needed. As needed 01/23/21   Ar Automatic Reconciliation   fluticasone (FLONASE) 50 MCG/ACT nasal spray 2 sprays by Nasal route daily 03/26/21   Ar Automatic Reconciliation   fluticasone-umeclidin-vilant (TRELEGY ELLIPTA) 100-62.5-25 MCG/INH AEPB 2 inhalation DAILY (route: inhalation) 08/21/20   Ar Automatic Reconciliation   gabapentin (NEURONTIN) 100 MG capsule Take 200 mg by mouth 3 times daily as needed. 02/06/21   Ar Automatic Reconciliation   LORazepam (ATIVAN) 0.5 MG tablet Take 0.5 mg by mouth in the morning. 01/30/21   Ar Automatic Reconciliation  Future Appointments   Date Time Provider [place]   07/19/2021 11:20 AM Shella Spearing, MD [facility]   08/03/2021  0000000 PM [facility] CT [address] [facility]   08/07/2021 10:10 AM [facility]   08/07/2021 10:45 AM Rayann Heman, MD [facility]   08/07/2021 10:45 AM Carron Brazen Garey Ham, RD [facility]   08/09/2021  1:00 PM Osa Craver, MD [facility]   12/31/2021  2:15 PM [name], MD [facility]    and   General Assessment    Do you have any symptoms that are causing concern?: No

## 2021-07-19 ENCOUNTER — Ambulatory Visit
Admit: 2021-07-19 | Discharge: 2021-07-19 | Payer: MEDICARE | Attending: Critical Care Medicine | Primary: Family Medicine

## 2021-07-19 ENCOUNTER — Inpatient Hospital Stay: Admit: 2021-07-19 | Payer: MEDICARE | Primary: Family Medicine

## 2021-07-19 DIAGNOSIS — J15212 Pneumonia due to Methicillin resistant Staphylococcus aureus: Secondary | ICD-10-CM

## 2021-07-19 MED ORDER — TRELEGY ELLIPTA 100-62.5-25 MCG/INH IN AEPB
Freq: Every day | RESPIRATORY_TRACT | 11 refills | Status: AC
Start: 2021-07-19 — End: ?

## 2021-07-19 MED ORDER — BUDESONIDE 0.5 MG/2ML IN SUSP
0.5 MG/2ML | Freq: Two times a day (BID) | RESPIRATORY_TRACT | 3 refills | Status: AC
Start: 2021-07-19 — End: ?

## 2021-07-19 NOTE — Progress Notes (Signed)
[facility]: FOLLOW-UP Patient Office Visit Note  [address] [place] [address]  [phone]    Patient Name:  Caleb Robinson  Date of Birth:  1951-10-02            Date of Service:  07/19/2021    Chief Complaint   Patient presents with    Follow-up    COPD       History of Present Illness:  This is a 70 year old white male past medical history of COPD, throat and tongue cancer, hypertension, dysphagia status post PEG hospitalized in June at [facility] when he presented with fever chills increased shortness of breath.  Patient had bibasilar infiltrates and he did undergo pulmonary evaluation as well as bronchoscopy.  BAL demonstrated positive cultures for MRSA.  Patient did require oxygen and was discharged on 1 L O2.    Since discharge the patient has done fairly well with improvement in pulmonary congestion.  There is a cough at night.  He is using oxygen but only as needed and comes into the office today without it.  Unfortunately still smokes a pack of cigarettes per day.  CT had demonstrated a 8 mm lung nodule which needed follow-up in November.    Past Medical History:   Diagnosis Date    Chronic obstructive pulmonary disease ([place])     follows with Pulmonology.  daily and rescure inhaler. no home O2.    Emphysema lung (HCC)     HTN (hypertension)     Tongue cancer (HCC)        Current Outpatient Medications:     oxyCODONE HCl (OXY-IR) 10 MG immediate release tablet, Take 10 mg by mouth every 8 hours as needed for Pain., Disp: , Rfl:     fluticasone-umeclidin-vilant (TRELEGY ELLIPTA) 100-62.5-25 MCG/INH AEPB, Inhale 1 puff into the lungs in the morning. 2 inhalation DAILY (route: inhalation)., Disp: 1 each, Rfl: 11    budesonide (PULMICORT) 0.5 MG/2ML nebulizer suspension, Take 2 mLs by nebulization in the morning and 2 mLs before bedtime., Disp: 60 each, Rfl: 3    rosuvastatin (CRESTOR) 10 MG tablet, Take 1 tablet by mouth nightly, Disp: 90 tablet, Rfl:  0    ibuprofen (ADVIL;MOTRIN) 800 MG tablet, Take 1 tablet by mouth twice daily as needed for pain, Disp: 60 tablet, Rfl: 0    ipratropium-albuterol (DUONEB) 0.5-2.5 (3) MG/3ML SOLN nebulizer solution, Inhale 3 mLs into the lungs every 6 hours as needed for Shortness of Breath, Disp: 360 mL, Rfl: 3    sertraline (ZOLOFT) 20 MG/ML concentrated solution, Take 2.5 mLs by mouth daily 2.5 mL DAILY (route: oral), Disp: 75 mL, Rfl: 5    albuterol sulfate HFA 108 (90 Base) MCG/ACT inhaler, Inhale 2 puffs into the lungs every 6 hours as needed, Disp: , Rfl:     chlorhexidine (PERIDEX) 0.12 % solution, 10 mL 4 TIMES DAILY (route: mucous membrane), Disp: , Rfl:     vitamin D 25 MCG (1000 UT) CAPS, Take by mouth daily, Disp: , Rfl:     diphenoxylate-atropine (LOMOTIL) 2.5-0.025 MG per tablet, Take 1 tablet by mouth 4 times daily as needed. As needed, Disp: , Rfl:     fluticasone (FLONASE) 50 MCG/ACT nasal spray, 2 sprays by Nasal route daily, Disp: , Rfl:     gabapentin (NEURONTIN) 100 MG capsule, Take 200 mg by mouth 3 times daily as needed., Disp: , Rfl:     LORazepam (ATIVAN) 0.5 MG tablet,  Take 0.5 mg by mouth in the morning. (Patient not taking: Reported on 07/19/2021), Disp: , Rfl:    Patient Active Problem List   Diagnosis    Emphysema lung ([place])    Dehydration    Vision loss    Tongue cancer ([place])    History of tobacco abuse    Centrilobular emphysema ([place])    Unstable angina ([place])    Myxomatous mitral valve    Acute respiratory failure with hypoxia (HCC)    Hyperlipidemia    Anxiety and depression    COPD with acute lower respiratory infection ([place])    Aspiration pneumonia ([place])    Severe protein-calorie malnutrition ([place])    Acute respiratory failure with hypoxemia ([place])    Anemia    Pneumonia of both lower lobes due to infectious organism    MRSA (methicillin resistant Staphylococcus aureus) infection    Oral phase dysphagia    S/P glossectomy    Localized swelling of right upper extremity    Right ear impacted  cerumen    Contracture of muscle, left thigh         Past Surgical History:   Procedure Laterality Date    BRONCHOSCOPY N/A 05/30/2021    BRONCHOSCOPY performed by Rogue Bussing, MD at [facility]  05/2019    IR PORT PLACEMENT EQUAL OR GREATER THAN 5 YEARS  06/29/2021    IR PORT PLACEMENT EQUAL OR GREATER THAN 5 YEARS 06/29/2021 [facility]       Social History     Socioeconomic History    Marital status: Single     Spouse name: Not on file    Number of children: Not on file    Years of education: Not on file    Highest education level: Not on file   Occupational History    Not on file   Tobacco Use    Smoking status: Every Day     Packs/day: 0.75     Years: 30.00     Pack years: 22.50     Types: Cigarettes    Smokeless tobacco: Never    Tobacco comments:     Quit smoking: stopped but restarted Dec 2019   Vaping Use    Vaping Use: Never used   Substance and Sexual Activity    Alcohol use: Yes     Alcohol/week: 1.0 standard drink    Drug use: Never    Sexual activity: Not on file     Comment: widow   Other Topics Concern    Not on file   Social History Narrative    Not on file     Social Determinants of Health     Financial Resource Strain: Not on file   Food Insecurity: Not on file   Transportation Needs: Not on file   Physical Activity: Not on file   Stress: Not on file   Social Connections: Not on file   Intimate Partner Violence: Not on file   Housing Stability: Not on file       Family History   Problem Relation Age of Onset    Dementia Mother        Allergies   Allergen Reactions    Cat Hair Extract Swelling           Review of Systems    OBJECTIVE:  Physical Exam:  There were no vitals filed for this visit.     GENERAL APPEARANCE:   The patient is  normal weight and in no respiratory distress.     HEENT:   PERRL.  Conjunctivae unremarkable.   Nasal mucosa is without epistaxis, exudate, or polyps.  Gums and dentition are unremarkable.  There is no oropharyngeal narrowing.  TMs are  clear.     NECK/LYMPHATIC:   Symmetrical with np elevation of jugular venous pulsation.  Trachea midline. No thyroid enlargement.  No cervical adenopathy.     LUNGS:   Normal respiratory effort with symmetrical lung expansion.   Breath sounds clear.     HEART:   There is a regular rate and rhythm.  No murmur, rub, or gallop.  There is no edema in the lower extremities.     ABDOMEN:   Soft and non-tender.  No hepatosplenomegaly.  Bowel sounds are normal.       NEURO:   The patient is alert and oriented to person, place, and time.  Memory appears intact and mood is normal.  No gross sensorimotor deficits are present.      DIAGNOSTIC TESTS:    CXR:      XR CHEST (2 VW) 05/29/2021    Narrative  Chest X-ray    INDICATION: Fever    PA and lateral views of the chest were obtained.    FINDINGS: There is increased infiltrate in both lung bases..  The heart size is  normal.  PICC line is present.    Impression  Increased bibasilar infiltrate      CT WITHOUT CONTRAST:    CT CHEST WO CONTRAST 01/17/2021    Narrative  CT of the chest without contrast.    CLINICAL INDICATION: Pulmonary nodules, followup examination.    PROCEDURE: Serial thin section axial images are obtained from the thoracic inlet  through the upper abdomen without the administration of intravenous contrast.  Radiation dose reduction techniques were used for this study. Our CT scanners  use one or all of the following: Automated exposure control, adjusted of the mA  and/or kV according to patient size, iterative reconstruction    COMPARISON: Chest CT dated 07/25/2020 and 05/31/2020    FINDINGS: No mediastinal or axilla adenopathy. There is no pleural or  pericardial effusion. The heart is normal in size. Atherosclerotic  calcifications noted in the aorta. Scarring is noted at the right lung apex.  There is diffuse emphysema. There is a stable 8 mm pulmonary nodule (image 90)  in the anterior right upper lobe. The previous nodular densities along the  posterior  aspect of the right upper lobe has resolved. There is increased  thickening along the minor fissure with atelectasis. No definite new pulmonary  nodules evident.    Limited evaluation of the upper abdomen is unremarkable. A PEG tube is in place.    No aggressive osseous is identified.    Impression  1. Stable 8 mm pulmonary nodule in the anterior aspect of the right upper lobe.  Recommend continued imaging surveillance with follow-up CT in six months.  2. The previous 7 mm pulmonary nodule in the more posterior right upper lobe has  resolved.  3. Severe emphysema  4. No acute cardiopulmonary abnormality.      CT WITH CONTRAST:    No results found for this or any previous visit from the past 365 days.        CT HIGH RES:    No results found for this or any previous visit from the past 365 days.      CT PE PROTOCOL:    CT  CHEST PULMONARY EMBOLISM W CONTRAST 04/14/2021    Narrative  EXAM: CT angiogram chest.    HISTORY: hx of cancer, presented with hypoxia.    TECHNIQUE: CT angiographic images of the chest were obtained following  intravenous administration of contrast. Imaging was performed utilizing PE  protocol. Examination was performed in the axial plane and coronal  reconstructions were performed. 3-D MIPS reconstructions of the chest were  obtained.    Dose reduction technique used: Automated exposure control/Adjustment of the mA  and/or kV according to patient size/Use of iterative reconstruction technique.    100 mL of Isovue-370 were utilized.    COMPARISON: Noncontrast CT chest dated 01/17/2021    FINDINGS:  There is adequate opacification of the pulmonary arterial tree. No significant  filling defects are identified to suggest pulmonary embolism. Main pulmonary  artery is prominent in caliber measuring 3.5 cm.    The heart is not enlarged and there is no pericardial effusion. The great  vessels appear normal.    The visualized thyroid is unremarkable. There are no pathologically enlarged  mediastinal or  axillary lymph nodes. There are a few prominent bilateral hilar  lymph nodes.    There is a small amount of fluid/secretions in the dependent portion of the  distal trachea and bilateral mainstem bronchus.    Diffuse emphysematous changes are appreciated. There is no pneumothorax. There  is a 7.3 mm nodule in the right upper lobe. Allowing for differences in  positioning and technique, there is no significant change.    There is a focal area of consolidation in the right lower lobe. It measures  roughly 3.1 x 1.9 cm.    There is mild left basilar infiltrate or atelectasis.    There are small bilateral pleural effusions.    A band of scarring is again seen at the base of the right upper lobe along the  minor fissure. Mild scarring or atelectasis is noted anterior to the left major  fissure in the lingula. There is bilateral apical scarring.    The visualized upper abdomen shows a PEG tube. No other obvious significant  changes are observed. Osseous structures are within normal limits.    Impression  1. No evidence of pulmonary embolism.    2. The main pulmonary artery is enlarged measuring 3.5 cm. This finding suggests  pulmonary hypertension.    3. Interval development of focal area of apparent consolidation in the right  lower lobe. Mild infiltrate or atelectasis is seen at the left base. There are  small bilateral pleural effusions.    4. Fluid/secretions are seen in the dependent portion of the distal trachea and  bilateral mainstem bronchus.    5. COPD. Stable right upper lobe nodule. Chronic lung changes.        LDCT SCREENING:    No results found for this or any previous visit from the past 365 days.      PET SCAN:    PET CT SKULL BASE TO MID THIGH 04/26/2021    Narrative  PET/CT TUMOR IMAGE SKULL THIGH (SUB)   04/26/2021 3:17 PM    HISTORY: Restaging colon cancer initially diagnosed October 2021.    COMPARISON EXAMS: CT chest 04/14/2021. CT soft tissue neck 03/19/2021 PET/CT  06/20/2020.    TECHNIQUE: The patient  was injected intravenously with 12.8 mCi 18 FDG. A  noncontrast CT was performed for attenuation correction purposes. This was  followed by delayed PET imaging from the skull base through the mid thigh. Blood  glucose: 139 mg/dL. The CT, PET and fusion images are then evaluated on a AW  workstation.    FINDINGS: Normal physiologic uptake is identified within the salivary glands,  myocardium, kidneys, ureters and bladder.  Scattered areas of physiologic bowel  uptake are also present.    NECK:  Lymph nodes: No pathologic activity. No enlarged cervical lymph nodes.    Additional findings: Extensive resection and reconstruction changes are noted  involving the anterior aspect of the mandible and floor of mouth. Residual  activity thought to reflect muscular uptake within the patient's residual  tongue. Defect involving the left tongue thought to reflect postoperative change  and/or atrophy. Extensive lymph node dissection also noted in the right and left  neck.    CHEST:  Lungs: No pathologic activity. Centrilobular emphysema. Scarring noted at the  posterior aspect right lower lobe and along the posterior lateral right middle  lobe. Lungs otherwise clear. Clearing of the previously noted right lower lobe  lung consolidation.    Lymph nodes: No pathologic activity. No enlarged thoracic lymph nodes.    Additional findings: Right PICC line in place.    ABDOMEN/PELVIS:  Hepatobiliary: No pathologic activity.    Spleen: No pathologic activity.    Pancreas: No pathologic activity.    Kidneys: No pathologic activity. Nonobstructing renal collecting system stones  are present bilaterally. Left renal atrophy. No hydronephrosis. Bladder is  decompressed.    Adrenals: No pathologic activity.    Reproductive: No pathologic activity. Prostate gland unremarkable.    Gastrointestinal: No pathologic activity. No bowel obstruction. Gastrostomy tube  noted in the stomach. Patient appears cachectic.    Lymph nodes: No pathologic  activity. No enlarged abdominal or pelvic lymph  nodes.    Additional findings: Prior bilateral inguinal hernia repair.    SKELETON:  No pathologic activity.    Impression  1. Extensive postsurgical changes involving the mandible and floor of mouth  previous tumor resection. Bilateral cervical lymph node dissection changes also  present. Muscular activity likely physiologic within the residual tongue. Left  aspect of tongue demonstrates scarring or atrophy with little FDG activity.  2. No evidence of metastatic disease in the neck, chest, abdomen or pelvis.  3. Nonobstructing renal collecting system stones bilaterally. No hydronephrosis.        Spirometry:      No flowsheet data found.    Exercise oximetry:          ASSESSMENT:   Diagnosis Orders   1. Pneumonia due to methicillin resistant Staphylococcus aureus (MRSA), unspecified laterality, unspecified part of lung (HCC)  XR CHEST (2 VW)      2. Centrilobular emphysema (HCC)  budesonide (PULMICORT) 0.5 MG/2ML nebulizer suspension      3. Cancer of tongue ([place])        4. Tobacco abuse        5. Lung nodule            PLAN:  Cxr out to follow-up on infiltrates  Chest CT in November  Appointment after chest CT  nebulizer treatments DuoNeb and Pulmicort  Follow up in 6 months      Orders Placed This Encounter   Procedures    XR CHEST (2 VW)     Standing Status:   Future     Standing Expiration Date:   10/19/2021     Order Specific Question:   Reason for exam:     Answer:   See Diagnosis  Orders Placed This Encounter   Medications    fluticasone-umeclidin-vilant (TRELEGY ELLIPTA) 100-62.5-25 MCG/INH AEPB     Sig: Inhale 1 puff into the lungs in the morning. 2 inhalation DAILY (route: inhalation).     Dispense:  1 each     Refill:  11    budesonide (PULMICORT) 0.5 MG/2ML nebulizer suspension     Sig: Take 2 mLs by nebulization in the morning and 2 mLs before bedtime.     Dispense:  60 each     Refill:  3           Electronically signed by  Shella Spearing,  MD

## 2021-08-01 ENCOUNTER — Encounter

## 2021-08-03 ENCOUNTER — Ambulatory Visit: Admit: 2021-08-03 | Payer: MEDICARE | Primary: Family Medicine

## 2021-08-03 ENCOUNTER — Inpatient Hospital Stay: Admit: 2021-08-03 | Payer: MEDICARE | Primary: Family Medicine

## 2021-08-03 DIAGNOSIS — C029 Malignant neoplasm of tongue, unspecified: Secondary | ICD-10-CM

## 2021-08-03 MED ORDER — IOPAMIDOL 76 % IV SOLN
76 % | Freq: Once | INTRAVENOUS | Status: AC | PRN
Start: 2021-08-03 — End: 2021-08-03
  Administered 2021-08-03: 18:00:00 100 mL via INTRAVENOUS

## 2021-08-03 MED ORDER — DIATRIZOATE MEGLUMINE & SODIUM 66-10 % PO SOLN
66-10 % | Freq: Once | ORAL | Status: DC | PRN
Start: 2021-08-03 — End: 2021-08-07
  Administered 2021-08-03: 19:00:00 15 mL via ORAL

## 2021-08-03 MED ORDER — NORMAL SALINE FLUSH 0.9 % IV SOLN
0.9 % | Freq: Once | INTRAVENOUS | Status: AC | PRN
Start: 2021-08-03 — End: 2021-08-03
  Administered 2021-08-03: 18:00:00 10 mL via INTRAVENOUS

## 2021-08-03 MED ORDER — SODIUM CHLORIDE 0.9 % IV BOLUS
0.9 % | Freq: Once | INTRAVENOUS | Status: AC | PRN
Start: 2021-08-03 — End: 2021-08-03
  Administered 2021-08-03: 19:00:00 100 mL via INTRAVENOUS

## 2021-08-06 ENCOUNTER — Ambulatory Visit: Payer: MEDICARE | Primary: Family Medicine

## 2021-08-06 ENCOUNTER — Encounter

## 2021-08-06 ENCOUNTER — Encounter: Payer: MEDICARE | Primary: Family Medicine

## 2021-08-06 ENCOUNTER — Encounter: Payer: MEDICARE | Attending: Hematology & Oncology | Primary: Family Medicine

## 2021-08-06 ENCOUNTER — Encounter: Primary: Family Medicine

## 2021-08-07 ENCOUNTER — Ambulatory Visit
Admit: 2021-08-07 | Discharge: 2021-08-07 | Payer: MEDICARE | Attending: Hematology & Oncology | Primary: Family Medicine

## 2021-08-07 ENCOUNTER — Inpatient Hospital Stay: Admit: 2021-08-07 | Payer: MEDICARE | Primary: Family Medicine

## 2021-08-07 ENCOUNTER — Ambulatory Visit: Admit: 2021-08-07 | Discharge: 2021-08-07 | Payer: MEDICARE | Primary: Family Medicine

## 2021-08-07 DIAGNOSIS — C029 Malignant neoplasm of tongue, unspecified: Secondary | ICD-10-CM

## 2021-08-07 DIAGNOSIS — Z008 Encounter for other general examination: Secondary | ICD-10-CM

## 2021-08-07 LAB — COMPREHENSIVE METABOLIC PANEL
ALT: 39 U/L (ref 12–65)
AST: 34 U/L (ref 15–37)
Albumin/Globulin Ratio: 0.8 — ABNORMAL LOW (ref 1.2–3.5)
Albumin: 3.1 g/dL — ABNORMAL LOW (ref 3.2–4.6)
Alk Phosphatase: 108 U/L (ref 50–136)
Anion Gap: 2 mmol/L — ABNORMAL LOW (ref 7–16)
BUN: 49 MG/DL — ABNORMAL HIGH (ref 8–23)
CO2: 34 mmol/L — ABNORMAL HIGH (ref 21–32)
Calcium: 9.9 MG/DL (ref 8.3–10.4)
Chloride: 103 mmol/L (ref 98–107)
Creatinine: 1 MG/DL (ref 0.8–1.5)
GFR African American: >60 mL/min/{1.73_m2} (ref >60)
GFR Non-African American: >60 mL/min/{1.73_m2} (ref >60)
Globulin: 4 g/dL — ABNORMAL HIGH (ref 2.3–3.5)
Glucose: 84 mg/dL (ref 65–100)
Potassium: 4.4 mmol/L (ref 3.5–5.1)
Sodium: 139 mmol/L (ref 136–145)
Total Bilirubin: 0.3 MG/DL (ref 0.2–1.1)
Total Protein: 7.1 g/dL (ref 6.3–8.2)

## 2021-08-07 LAB — CBC WITH AUTO DIFFERENTIAL
Absolute Eos #: 0.1 10*3/uL (ref 0.0–0.8)
Absolute Immature Granulocyte: 0.1 10*3/uL (ref 0.0–0.5)
Absolute Lymph #: 1 10*3/uL (ref 0.5–4.6)
Absolute Mono #: 0.7 10*3/uL (ref 0.1–1.3)
Basophils Absolute: 0.1 10*3/uL (ref 0.0–0.2)
Basophils: 1 % (ref 0.0–2.0)
Eosinophils %: 1 % (ref 0.5–7.8)
Hematocrit: 34.6 %
Hemoglobin: 10.6 g/dL — ABNORMAL LOW (ref 13.6–17.2)
Immature Granulocytes: 1 % (ref 0.0–5.0)
Lymphocytes: 10 % — ABNORMAL LOW (ref 13–44)
MCH: 30.5 PG (ref 26.1–32.9)
MCHC: 30.6 g/dL — ABNORMAL LOW (ref 31.4–35.0)
MCV: 99.4 FL — ABNORMAL HIGH (ref 79.6–97.8)
MPV: 11.2 FL (ref 9.4–12.3)
Monocytes: 7 % (ref 4.0–12.0)
Platelets: 298 10*3/uL (ref 150–450)
RBC: 3.48 M/uL — ABNORMAL LOW (ref 4.23–5.6)
RDW: 17.5 % — ABNORMAL HIGH (ref 11.9–14.6)
Seg Neutrophils: 80 % — ABNORMAL HIGH (ref 43–78)
Segs Absolute: 7.5 10*3/uL (ref 1.7–8.2)
WBC: 9.3 10*3/uL (ref 4.3–11.1)
nRBC: 0 10*3/uL (ref 0.0–0.2)

## 2021-08-07 LAB — MAGNESIUM: Magnesium: 2.4 mg/dL (ref 1.8–2.4)

## 2021-08-07 NOTE — Patient Instructions (Signed)
Patient Instructions from Today's Visit    Reason for Visit:  Follow up for H&N Cancer - CT scan results     Plan:  Your CT scan shows no evidence of cancer     Your consult with Dr. Elana Alm is on (9/7)    Continue with your tube feedings     Follow Up:  Call Davey Bergsma and let her know when you need continued local follow after your surgery     Recent Lab Results:  Hospital Outpatient Visit on 08/07/2021   Component Date Value Ref Range Status    WBC 08/07/2021 9.3  4.3 - 11.1 K/uL Final    RBC 08/07/2021 3.48 (A) 4.23 - 5.6 M/uL Final    Hemoglobin 08/07/2021 10.6 (A) 13.6 - 17.2 g/dL Final    Hematocrit 08/07/2021 34.6  % Final    MCV 08/07/2021 99.4 (A) 79.6 - 97.8 FL Final    MCH 08/07/2021 30.5  26.1 - 32.9 PG Final    MCHC 08/07/2021 30.6 (A) 31.4 - 35.0 g/dL Final    RDW 08/07/2021 17.5 (A) 11.9 - 14.6 % Final    Platelets 08/07/2021 298  150 - 450 K/uL Final    MPV 08/07/2021 11.2  9.4 - 12.3 FL Final    nRBC 08/07/2021 0.00  0.0 - 0.2 K/uL Final    **Note: Absolute NRBC parameter is now reported with Hemogram**    Differential Type 08/07/2021 AUTOMATED    Final    Seg Neutrophils 08/07/2021 80 (A) 43 - 78 % Final    Lymphocytes 08/07/2021 10 (A) 13 - 44 % Final    Monocytes 08/07/2021 7  4.0 - 12.0 % Final    Eosinophils % 08/07/2021 1  0.5 - 7.8 % Final    Basophils 08/07/2021 1  0.0 - 2.0 % Final    Immature Granulocytes 08/07/2021 1  0.0 - 5.0 % Final    Segs Absolute 08/07/2021 7.5  1.7 - 8.2 K/UL Final    Absolute Lymph # 08/07/2021 1.0  0.5 - 4.6 K/UL Final    Absolute Mono # 08/07/2021 0.7  0.1 - 1.3 K/UL Final    Absolute Eos # 08/07/2021 0.1  0.0 - 0.8 K/UL Final    Basophils Absolute 08/07/2021 0.1  0.0 - 0.2 K/UL Final    Absolute Immature Granulocyte 08/07/2021 0.1  0.0 - 0.5 K/UL Final    Sodium 08/07/2021 139  136 - 145 mmol/L Final    Potassium 08/07/2021 4.4  3.5 - 5.1 mmol/L Final    Chloride 08/07/2021 103  98 - 107 mmol/L Final    CO2 08/07/2021 34 (A) 21 - 32 mmol/L Final    Anion Gap  08/07/2021 2 (A) 7 - 16 mmol/L Final    RESULTS CHECKED X 2    Glucose 08/07/2021 84  65 - 100 mg/dL Final    BUN 08/07/2021 49 (A) 8 - 23 MG/DL Final    Creatinine 08/07/2021 1.00  0.8 - 1.5 MG/DL Final    GFR African American 08/07/2021 >60  >60 ml/min/1.64m Final    GFR Non-African American 08/07/2021 >60  >60 ml/min/1.721mFinal    Comment:      Estimated GFR is calculated using the Modification of Diet in Renal Disease (MDRD) Study equation, reported for both African Americans (GFRAA) and non-African Americans (GFRNA), and normalized to 1.7386mody surface area. The physician must decide which value applies to the patient.  The MDRD study equation should only be used in individuals age 70  or older. It has not been validated for the following: pregnant women, patients with serious comorbid conditions,or on certain medications, or persons with extremes of body size, muscle mass, or nutritional status.      Calcium 08/07/2021 9.9  8.3 - 10.4 MG/DL Final    Total Bilirubin 08/07/2021 0.3  0.2 - 1.1 MG/DL Final    ALT 08/07/2021 39  12 - 65 U/L Final    AST 08/07/2021 34  15 - 37 U/L Final    Alk Phosphatase 08/07/2021 108  50 - 136 U/L Final    Total Protein 08/07/2021 7.1  6.3 - 8.2 g/dL Final    Albumin 08/07/2021 3.1 (A) 3.2 - 4.6 g/dL Final    Globulin 08/07/2021 4.0 (A) 2.3 - 3.5 g/dL Final    Albumin/Globulin Ratio 08/07/2021 0.8 (A) 1.2 - 3.5   Final    Magnesium 08/07/2021 2.4  1.8 - 2.4 mg/dL Final         Treatment Summary has been discussed and given to patient: NA        -------------------------------------------------------------------------------------------------------------------  Please call our office at [phone] if you have any  of the following symptoms:   Fever of 100.5 or greater  Chills  Shortness of breath  Swelling or pain in one leg    After office hours an answering service is available and will contact a provider for emergencies or if you are experiencing any of the above  symptoms.    Patient does express an interest in My Chart.  My Chart log in information explained on the after visit summary printout at the [facility] desk.    Ray, CSO, LD  Outpatient Oncology Dietitian  Head and Neck Tumor Navigator  [phone]  [email or web address]

## 2021-08-07 NOTE — Progress Notes (Signed)
HEMATOLOGY/ONCOLOGY FOLLOWUP  VISIT      Patient Name: Caleb Robinson             Date of Visit: 08/07/2021  DOB: June 08, 1951  Age:70 y.o.                      Presenting Complaint:  Caleb Robinson  is seen in follow-up for tongue cancer.    History of Present Illness:  Caleb Robinson was seen for the first time in our office in July 2021.  He was a gentleman with a rather substantial alcohol and tobacco history.  He has smoked upwards of 2-1/2 packs/day during the  course of his life but now is down to less than a pack.  He indicated that his alcohol use was substantial prior to 9 years ago when his intake decreased significant after the death of his wife.  In 26-Dec-2019 he first noticed some tongue discomfort  associated with weight loss.  By May 2021 his discomfort had significantly increased and he sought medical attention.  CT scanning of his neck on May 26 showed right greater than left submandibular gland enlargement with an indeterminate lymph node at  level 2 on the right he was seen by Dr. Elby Beck on May 28.  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   The remainder of the laryngoscopy was negative.  A subsequent PET scan was performed notable for FDG uptake within the ventral tongue.  There was an FDG avid right level 2A lymph node concerning for metastasis.  There was faint FDG uptake associated  with nodules noted in the left lung 1 of which was new from a CT on June 23.  A previously seen right lung nodule was not FDG avid. On October 20, he underwent excision of most of the tongue floor of the mouth and jaw with a radical neck dissection.   He had a transient tracheostomy and a G-tube was placed.  The pathology report is not visible in Care Everywhere at this time.  However, Dr. 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  After discussion with radiation oncology, a decision was made to treat the patient with radiation therapy alone.   He returns today for follow-up.  He had been followed routinely by Dr. Nance Pew prior to Dr. Donavan Burnet departure to the [facility].  This is the first I have seen him since January this year.  Since that time, he has had progressive difficulty with his reconstruction.  At this point, he is scheduled to see Dr. Elana Alm for consideration of a revision of the mandibular prosthesis.  He has ongoing pain which is managed by pain management.  His nutrition is limited to G-tube feedings.  He has no cough or shortness of breath.  His weight has been stable but this is about 35 pounds less than his baseline.  CT scanning prior to today's visit shows no evidence of active cancer.  There are extensive surgical changes however which could potentially hiatal lesion.      Medications:   Current Outpatient Medications   Medication Sig Dispense Refill    ascorbic acid (VITAMIN C) 100 MG tablet Take 100 mg by mouth daily      ferrous sulfate (IRON 325) 325 (65 Fe) MG tablet Take by mouth every morning (before breakfast)  potassium chloride (KLOR-CON M) 20 MEQ extended release tablet Take by mouth      Vitamin A 3 MG (10000 UT) TABS Take by mouth      Magnesium Oxide 200 MG TABS Take by mouth      oxyCODONE HCl (OXY-IR) 10 MG immediate release tablet Take 10 mg by mouth every 8 hours as needed for Pain.      fluticasone-umeclidin-vilant (TRELEGY ELLIPTA) 100-62.5-25 MCG/INH AEPB Inhale 1 puff into the lungs in the morning. 2 inhalation DAILY (route: inhalation). 1 each 11    budesonide (PULMICORT) 0.5 MG/2ML nebulizer suspension Take 2 mLs by nebulization in the morning and 2 mLs before bedtime. 60 each 3    rosuvastatin (CRESTOR) 10 MG tablet Take 1 tablet by mouth nightly 90 tablet 0    ibuprofen (ADVIL;MOTRIN) 800 MG tablet Take 1 tablet by mouth twice daily as needed for pain 60 tablet 0     ipratropium-albuterol (DUONEB) 0.5-2.5 (3) MG/3ML SOLN nebulizer solution Inhale 3 mLs into the lungs every 6 hours as needed for Shortness of Breath 360 mL 3    sertraline (ZOLOFT) 20 MG/ML concentrated solution Take 2.5 mLs by mouth daily 2.5 mL DAILY (route: oral) 75 mL 5    albuterol sulfate HFA 108 (90 Base) MCG/ACT inhaler Inhale 2 puffs into the lungs every 6 hours as needed      chlorhexidine (PERIDEX) 0.12 % solution 10 mL 4 TIMES DAILY (route: mucous membrane)      vitamin D 25 MCG (1000 UT) CAPS Take by mouth daily      diphenoxylate-atropine (LOMOTIL) 2.5-0.025 MG per tablet Take 1 tablet by mouth 4 times daily as needed. As needed      fluticasone (FLONASE) 50 MCG/ACT nasal spray 2 sprays by Nasal route daily      gabapentin (NEURONTIN) 100 MG capsule Take 200 mg by mouth 3 times daily as needed.      LORazepam (ATIVAN) 0.5 MG tablet Take 0.5 mg by mouth in the morning. (Patient not taking: No sig reported)       No current facility-administered medications for this visit.       Allergies:  Allergies   Allergen Reactions    Cat Hair Extract Swelling       Review of Systems:  The Review of Systems is documented in full in the internal medical record. All systems are negative other than for those noted above.     Past Medical History:  Documented in electronic medical record    Past Surgical History:  Documented in electronic medical record    Social History:  Documented in electronic medical record    Family History:  Documented in electronic medical record      Physical Examination:  General Appearance: Cachectic and chronically ill appearing patient in no acute distress.   Vital signs: BP (!) 116/54 (Site: Left Upper Arm, Position: Standing, Cuff Size: Small Adult)    Pulse 55    Temp 98.4 ??F (36.9 ??C) (Axillary)    Resp 12    Ht '5\' 4"'$  (1.626 m)    Wt 87 lb 4.8 oz (39.6 kg)    BMI 14.99 kg/m??     Performance status: ECOG Level: 2  Distress  Screening Score: PHQ-9 Total Score: 1 (08/07/2021 11:08  AM)    Pain score: Pain Score:   0 - No pain (fatigue-2)  HEENT: There is significant abnormality involving the floor of the mouth.  The mandibular prosthesis is virtually completely exposed with  presumptively necrotic bone.  Neck: Supple. There is no thyromegaly.   Lymph nodes: There is no cervical, supraclavicular, axillary or inguinal adenopathy.   Lungs: The lungs are clear to auscultation and percussion. There is no egophony. There is no chest wall tenderness and no  use of accessory respiratory musculature.  Heart: There is no jugular venous distention. The rate is normal and rhythm regular. The S1 and S2 are normal and there are no murmurs or rubs.    Abdomen: Soft, non-tender, bowel sounds present and normal, no appreciated hepatosplenomegaly. No palpable masses.   Skin: No rash, petechiae or ecchymoses. No evidence of malignancy.   Extremities: No cyanosis, clubbing or edema.       Labs/Imaging:  Lab Results   Component Value Date/Time    WBC 9.3 08/07/2021 10:26 AM    HGB 10.6 08/07/2021 10:26 AM    HCT 34.6 08/07/2021 10:26 AM    PLT 298 08/07/2021 10:26 AM    MCV 99.4 08/07/2021 10:26 AM       Lab Results   Component Value Date/Time    NA 139 08/07/2021 10:26 AM    K 4.4 08/07/2021 10:26 AM    CL 103 08/07/2021 10:26 AM    CO2 34 08/07/2021 10:26 AM    BUN 49 08/07/2021 10:26 AM    GFRAA >60 08/07/2021 10:26 AM    GLOB 4.0 08/07/2021 10:26 AM    ALT 39 08/07/2021 10:26 AM       Above results reviewed with patient.         ASSESSMENT:  This gentleman had a locally advanced tongue cancer treated with surgical resection and mandibular reconstruction.  At the present time, he has no evidence of cancer recurrence but unfortunately the mandibular prosthesis is exposed and will likely need revision.    PLAN:  He is scheduled to be seen in [place] next week at which point a decision regarding the viability of the prosthesis and what if anything can be done to manage his difficulties.  A formal follow-up  appointment has not been set up pending any surgical decisions in [place] and there needs for ongoing postoperative follow-up.        RESUSCITATION DIRECTIVES/HOSPICE CARE;  Full Support           [place]    Oncology and Hematology Program Director  [facility]  [address] [place] [address]  P [phone]  F [phone]  [email or web address]

## 2021-08-08 NOTE — Progress Notes (Signed)
Nutrition F/U:  Assessment:  Pt seen during office visit w/ Dr. Sonny Masters, completed radiation in March 2022, CT scan of neck showing NED, pt has follow up with Dr. Coralyn Mark Day @ [facility] on (9/7) regarding surgical consult - pt's hardware and bone is exposed, remains NPO and TF dependent for estimated nutrition needs, bolus feeding 5-6 cartons of Nutren 1.5, reports some days doing up to 9 cartons/day, pt feels chronically hungry although not gaining weight and prescribed TF is adequate to meet nutrition needs.  Pt will clean his mouth w/ free water via a 60 mL syringe.  Pt has old PEG in place, awaiting new MIC-KEY button, port placed for home IVF (every other day), [facility] supplying fluids and [facility] home health changing port needle weekly. Current BW: 87#, stable over the past 6 weeks.     Intervention:  1. Continue w/ water via syringe po as tolerated  2. Consult with Dr. Coralyn Mark Day on (9/7)   3. Continue w/ current TF regimen  4. Continue w/ home IVF via port     Monitoring/Evaluation:  1. Pt's care giver to notify us once plans for surgery are made to determine level of follow up care needed from medical oncology.    [place], [place], [place]

## 2021-08-09 ENCOUNTER — Encounter: Payer: MEDICARE | Attending: Radiation Oncology | Primary: Family Medicine

## 2021-08-14 NOTE — Care Coordination-Inpatient (Signed)
RNCM attempted to reach pt, no answer left HIPAA compliant vm.  Will continue attempts to reach pt.

## 2021-08-15 NOTE — Telephone Encounter (Signed)
Almyra Free with [facility] called wanting to update PCP that the patients roommate was taking him to the ER for low BP. BP was 78/42. Patient was lethargic and can not stay awake. Please call with questions

## 2021-08-24 NOTE — Telephone Encounter (Signed)
[facility] # [id number]

## 2021-08-27 NOTE — Telephone Encounter (Signed)
Signed, faxed, confirmation received. Placed in tray to scan.

## 2021-08-31 NOTE — Care Coordination-Inpatient (Signed)
RNCM attempted to reach pt, no answer.  Will continue attempts to reach pt.

## 2021-09-07 NOTE — Care Coordination-Inpatient (Signed)
RNCM attempted to reach pt, no answer left HIPAA compliant vm.  Will continue attempts to reach pt.

## 2021-09-13 NOTE — Care Coordination-Inpatient (Signed)
Ambulatory Care Coordination Note  09/13/2021    ACC: Tilford Pillar, RN        Offered patient enrollment in the Remote Patient Monitoring (RPM) program for in-home monitoring: NA.   RNCM received vm from pt after hrs requesting call back. RNCM returned call, left vm, will await call back.

## 2021-09-17 ENCOUNTER — Inpatient Hospital Stay: Admit: 2021-09-17 | Payer: MEDICARE | Primary: Family Medicine

## 2021-09-17 ENCOUNTER — Encounter

## 2021-09-17 DIAGNOSIS — K9423 Gastrostomy malfunction: Secondary | ICD-10-CM

## 2021-09-17 MED ORDER — DIATRIZOATE MEGLUMINE & SODIUM 66-10 % PO SOLN
66-10 % | Freq: Once | ORAL | Status: DC | PRN
Start: 2021-09-17 — End: 2021-09-21
  Administered 2021-09-17: 19:00:00 30 mL via GASTROSTOMY

## 2021-09-24 NOTE — Care Coordination-Inpatient (Signed)
RNCM attempted to reach pt, no answer.  Will continue attempts to reach pt.

## 2021-09-26 NOTE — Telephone Encounter (Signed)
Signed, faxed, confirmation recieved. Placed in tray to scan.

## 2021-09-26 NOTE — Telephone Encounter (Signed)
[facility] # [id number]

## 2021-10-01 MED ORDER — MIRTAZAPINE 15 MG PO TABS
15 MG | ORAL_TABLET | ORAL | 5 refills | Status: AC
Start: 2021-10-01 — End: ?

## 2021-10-01 NOTE — Telephone Encounter (Signed)
Mirtazapine to go [place] [facility] [address]

## 2021-10-01 NOTE — Care Coordination-Inpatient (Signed)
Ambulatory Care Coordination Note  10/01/2021    ACC: Tilford Pillar, RN        Offered patient enrollment in the Remote Patient Monitoring (RPM) program for in-home monitoring: NA.  RNCM spoke w/ pt. Pt continues to have [place] RN through [facility] for port care and IVFs every 2-3d  Pt reports hands and feet swelling. Pt on O2 1ltr continuous now.     RNCM called PCP to schedule OV as requested by pt, was transferred to triage nurse who stated she would call pt to assess.  Pt then called RNCM, requested pt to call office to report symptoms.  Noted PCP visit scheduled for 10/27.   RNCM scheduled f/u in 1wk.    Care Coordination Interventions    Referral from Primary Care Provider: No  Suggested Interventions and [place]: In Process (Comment: [facility])          Goals Addressed                   This Visit's Progress     Conditions and Symptoms   On track     I will schedule office visits, as directed by my provider.  I will keep my appointment or reschedule if I have to cancel.  I will notify my provider of any barriers to my plan of care.  I will follow my Zone Management tool to seek urgent or emergent care.  I will notify my provider of any symptoms that indicate a worsening of my condition.    Barriers: none  Plan for overcoming my barriers: N/A  Confidence: 10/10  Anticipated Goal Completion Date: 07/25/21                  Prior to Admission medications    Medication Sig Start Date End Date Taking? Authorizing Provider   mirtazapine (REMERON) 15 MG tablet Take 1 tablet by mouth nightly 10/01/21   [name], MD   ascorbic acid (VITAMIN C) 100 MG tablet Take 100 mg by mouth daily    Historical Provider, MD   ferrous sulfate (IRON 325) 325 (65 Fe) MG tablet Take by mouth every morning (before breakfast)    Historical Provider, MD   potassium chloride (KLOR-CON M) 20 MEQ extended release tablet Take by mouth    Historical Provider, MD   Vitamin A 3 MG (10000 UT) TABS Take by mouth     Historical Provider, MD   Magnesium Oxide 200 MG TABS Take by mouth    Historical Provider, MD   oxyCODONE HCl (OXY-IR) 10 MG immediate release tablet Take 10 mg by mouth every 8 hours as needed for Pain.    Historical Provider, MD   fluticasone-umeclidin-vilant (TRELEGY ELLIPTA) 100-62.5-25 MCG/INH AEPB Inhale 1 puff into the lungs in the morning. 2 inhalation DAILY (route: inhalation). 07/19/21   Shella Spearing, MD   budesonide (PULMICORT) 0.5 MG/2ML nebulizer suspension Take 2 mLs by nebulization in the morning and 2 mLs before bedtime. 07/19/21   Shella Spearing, MD   rosuvastatin (CRESTOR) 10 MG tablet Take 1 tablet by mouth nightly 07/09/21   Verdie Shire, MD   ibuprofen (ADVIL;MOTRIN) 800 MG tablet Take 1 tablet by mouth twice daily as needed for pain 07/09/21   Verdie Shire, MD   ipratropium-albuterol (DUONEB) 0.5-2.5 (3) MG/3ML SOLN nebulizer solution Inhale 3 mLs into the lungs every 6 hours as needed for Shortness of Breath 06/26/21   [name], MD  sertraline (ZOLOFT) 20 MG/ML concentrated solution Take 2.5 mLs by mouth daily 2.5 mL DAILY (route: oral) 05/28/21   [name], MD   albuterol sulfate HFA 108 (90 Base) MCG/ACT inhaler Inhale 2 puffs into the lungs every 6 hours as needed 02/14/21   Ar Automatic Reconciliation   chlorhexidine (PERIDEX) 0.12 % solution 10 mL 4 TIMES DAILY (route: mucous membrane) 10/14/20   Ar Automatic Reconciliation   vitamin D 25 MCG (1000 UT) CAPS Take by mouth daily    Ar Automatic Reconciliation   diphenoxylate-atropine (LOMOTIL) 2.5-0.025 MG per tablet Take 1 tablet by mouth 4 times daily as needed. As needed 01/23/21   Ar Automatic Reconciliation   fluticasone (FLONASE) 50 MCG/ACT nasal spray 2 sprays by Nasal route daily 03/26/21   Ar Automatic Reconciliation   gabapentin (NEURONTIN) 100 MG capsule Take 200 mg by mouth 3 times daily as needed. 02/06/21   Ar Automatic Reconciliation       Future Appointments   Date Time Provider [place]   10/04/2021 11:00  AM [name], MD FPA [facility] AMB   10/19/2021  5:28 AM SFD CT 64 SLICE UNIT 1 SFDRCT SFD   12/31/2021  2:15 PM [name], MD FPA [facility] AMB   01/30/2022  4:00 PM Shella Spearing, MD PPS [facility] AMB    and   General Assessment    Do you have any symptoms that are causing concern?: Yes  Progression since Onset: Gradually Worsening  Reported Symptoms: Shortness of Breath, Other (Comment: swelling hands and feet)

## 2021-10-01 NOTE — Telephone Encounter (Signed)
Received call from [facility] at [facility], caller not on line.     Complaint: swelling sob    Practice Name: [facility]    Caller's telephone number verified as [phone]    Unsuccessful attempt to re-connect with caller via phone, left message for return call to office           Nurse care manager called to set up office visit not with pt so called the pt and no answer        Reason for Disposition  ??? Caller can't be reached by phone    Protocols used: Information Only Call - No Triage-ADULT-AH

## 2021-10-04 ENCOUNTER — Encounter: Payer: MEDICARE | Attending: Family Medicine | Primary: Family Medicine

## 2021-10-05 NOTE — Telephone Encounter (Signed)
Order from [facility] for pts medical supplies. Placed in dr's in tray.

## 2021-10-09 NOTE — Care Coordination-Inpatient (Signed)
Ambulatory Care Coordination Note  10/09/2021    ACC: Tilford Pillar, RN        Offered patient enrollment in the Remote Patient Monitoring (RPM) program for in-home monitoring: NA.    Pt states he is on his way to [facility] to see surgeon. He unintentionally missed the PCP appt scheduled 10/04/21 for c/o swelling in hands and feet. He reports the swelling has improved slightly, also mentions he would like to have vision checked.  He plans to reschedule this appt upon his return from [place]. Pt denies other concerns. RNCM remains available for care coordination.   Care Coordination Interventions    Referral from Primary Care Provider: No  Suggested Interventions and [place]: In Process (Comment: [facility])          Goals Addressed    None         Prior to Admission medications    Medication Sig Start Date End Date Taking? Authorizing Provider   mirtazapine (REMERON) 15 MG tablet Take 1 tablet by mouth nightly 10/01/21   [name], MD   ascorbic acid (VITAMIN C) 100 MG tablet Take 100 mg by mouth daily    Historical Provider, MD   ferrous sulfate (IRON 325) 325 (65 Fe) MG tablet Take by mouth every morning (before breakfast)    Historical Provider, MD   potassium chloride (KLOR-CON M) 20 MEQ extended release tablet Take by mouth    Historical Provider, MD   Vitamin A 3 MG (10000 UT) TABS Take by mouth    Historical Provider, MD   Magnesium Oxide 200 MG TABS Take by mouth    Historical Provider, MD   oxyCODONE HCl (OXY-IR) 10 MG immediate release tablet Take 10 mg by mouth every 8 hours as needed for Pain.    Historical Provider, MD   fluticasone-umeclidin-vilant (TRELEGY ELLIPTA) 100-62.5-25 MCG/INH AEPB Inhale 1 puff into the lungs in the morning. 2 inhalation DAILY (route: inhalation). 07/19/21   Shella Spearing, MD   budesonide (PULMICORT) 0.5 MG/2ML nebulizer suspension Take 2 mLs by nebulization in the morning and 2 mLs before bedtime. 07/19/21   Shella Spearing, MD    rosuvastatin (CRESTOR) 10 MG tablet Take 1 tablet by mouth nightly 07/09/21   Verdie Shire, MD   ibuprofen (ADVIL;MOTRIN) 800 MG tablet Take 1 tablet by mouth twice daily as needed for pain 07/09/21   Verdie Shire, MD   ipratropium-albuterol (DUONEB) 0.5-2.5 (3) MG/3ML SOLN nebulizer solution Inhale 3 mLs into the lungs every 6 hours as needed for Shortness of Breath 06/26/21   [name], MD   sertraline (ZOLOFT) 20 MG/ML concentrated solution Take 2.5 mLs by mouth daily 2.5 mL DAILY (route: oral) 05/28/21   [name], MD   albuterol sulfate HFA 108 (90 Base) MCG/ACT inhaler Inhale 2 puffs into the lungs every 6 hours as needed 02/14/21   Ar Automatic Reconciliation   chlorhexidine (PERIDEX) 0.12 % solution 10 mL 4 TIMES DAILY (route: mucous membrane) 10/14/20   Ar Automatic Reconciliation   vitamin D 25 MCG (1000 UT) CAPS Take by mouth daily    Ar Automatic Reconciliation   diphenoxylate-atropine (LOMOTIL) 2.5-0.025 MG per tablet Take 1 tablet by mouth 4 times daily as needed. As needed 01/23/21   Ar Automatic Reconciliation   fluticasone (FLONASE) 50 MCG/ACT nasal spray 2 sprays by Nasal route daily 03/26/21   Ar Automatic Reconciliation   gabapentin (NEURONTIN) 100 MG capsule Take 200 mg by  mouth 3 times daily as needed. 02/06/21   Ar Automatic Reconciliation       Future Appointments   Date Time Provider [place]   10/19/2021  9:37 AM SFD CT 64 SLICE UNIT 1 SFDRCT SFD   12/31/2021  2:15 PM [name], MD FPA [facility] AMB   01/30/2022  4:00 PM Shella Spearing, MD PPS [facility] AMB    and   General Assessment

## 2021-10-12 NOTE — Telephone Encounter (Signed)
In doctor's folder 

## 2021-10-15 NOTE — Telephone Encounter (Signed)
Signed, faxed, confirmation recieved. Placed in tray to scan.

## 2021-10-19 ENCOUNTER — Inpatient Hospital Stay: Payer: MEDICARE | Primary: Family Medicine

## 2021-10-23 NOTE — Care Coordination-Inpatient (Signed)
Ambulatory Care Coordination Note  10/23/2021    ACC: Tilford Pillar, RN        Offered patient enrollment in the Remote Patient Monitoring (RPM) program for in-home monitoring: NA.    Pt reports he is doing well, no hand/feet swelling. Pt will be followed by Dr. Saunders Glance surgeon for further surgical needs. Continues to have [place] w/ IVFs. He denies concerns at this time. Will close episode, pt has RNCM contact information for questions or concerns.     Care Coordination Interventions    Referral from Primary Care Provider: No  Suggested Interventions and [place]: In Process (Comment: [facility])          Goals Addressed                   This Visit's Progress     COMPLETED: Conditions and Symptoms        I will schedule office visits, as directed by my provider.  I will keep my appointment or reschedule if I have to cancel.  I will notify my provider of any barriers to my plan of care.  I will follow my Zone Management tool to seek urgent or emergent care.  I will notify my provider of any symptoms that indicate a worsening of my condition.    Barriers: none  Plan for overcoming my barriers: N/A  Confidence: 10/10  Anticipated Goal Completion Date: 07/25/21                  Prior to Admission medications    Medication Sig Start Date End Date Taking? Authorizing Provider   mirtazapine (REMERON) 15 MG tablet Take 1 tablet by mouth nightly 10/01/21   [name], MD   ascorbic acid (VITAMIN C) 100 MG tablet Take 100 mg by mouth daily    Historical Provider, MD   ferrous sulfate (IRON 325) 325 (65 Fe) MG tablet Take by mouth every morning (before breakfast)    Historical Provider, MD   potassium chloride (KLOR-CON M) 20 MEQ extended release tablet Take by mouth    Historical Provider, MD   Vitamin A 3 MG (10000 UT) TABS Take by mouth    Historical Provider, MD   Magnesium Oxide 200 MG TABS Take by mouth    Historical Provider, MD   oxyCODONE HCl (OXY-IR) 10 MG immediate release tablet Take  10 mg by mouth every 8 hours as needed for Pain.    Historical Provider, MD   fluticasone-umeclidin-vilant (TRELEGY ELLIPTA) 100-62.5-25 MCG/INH AEPB Inhale 1 puff into the lungs in the morning. 2 inhalation DAILY (route: inhalation). 07/19/21   Shella Spearing, MD   budesonide (PULMICORT) 0.5 MG/2ML nebulizer suspension Take 2 mLs by nebulization in the morning and 2 mLs before bedtime. 07/19/21   Shella Spearing, MD   rosuvastatin (CRESTOR) 10 MG tablet Take 1 tablet by mouth nightly 07/09/21   Verdie Shire, MD   ibuprofen (ADVIL;MOTRIN) 800 MG tablet Take 1 tablet by mouth twice daily as needed for pain 07/09/21   Verdie Shire, MD   ipratropium-albuterol (DUONEB) 0.5-2.5 (3) MG/3ML SOLN nebulizer solution Inhale 3 mLs into the lungs every 6 hours as needed for Shortness of Breath 06/26/21   [name], MD   sertraline (ZOLOFT) 20 MG/ML concentrated solution Take 2.5 mLs by mouth daily 2.5 mL DAILY (route: oral) 05/28/21   [name], MD   albuterol sulfate HFA 108 (90 Base) MCG/ACT inhaler Inhale 2  puffs into the lungs every 6 hours as needed 02/14/21   Ar Automatic Reconciliation   chlorhexidine (PERIDEX) 0.12 % solution 10 mL 4 TIMES DAILY (route: mucous membrane) 10/14/20   Ar Automatic Reconciliation   vitamin D 25 MCG (1000 UT) CAPS Take by mouth daily    Ar Automatic Reconciliation   diphenoxylate-atropine (LOMOTIL) 2.5-0.025 MG per tablet Take 1 tablet by mouth 4 times daily as needed. As needed 01/23/21   Ar Automatic Reconciliation   fluticasone (FLONASE) 50 MCG/ACT nasal spray 2 sprays by Nasal route daily 03/26/21   Ar Automatic Reconciliation   gabapentin (NEURONTIN) 100 MG capsule Take 200 mg by mouth 3 times daily as needed. 02/06/21   Ar Automatic Reconciliation       Future Appointments   Date Time Provider [place]   12/31/2021  2:15 PM [name], MD FPA [facility] AMB   01/30/2022  4:00 PM Shella Spearing, MD PPS [facility] AMB    and   General Assessment    Do you have any symptoms that are  causing concern?: No

## 2021-10-23 NOTE — Care Coordination-Inpatient (Signed)
RNCM attempted to reach pt, no answer left HIPAA compliant vm.  Will continue attempts to reach pt.

## 2021-10-29 NOTE — Telephone Encounter (Signed)
[facility] # [id number]

## 2021-10-30 NOTE — Telephone Encounter (Signed)
Signed, faxed, confirmation recieved. Placed in tray to scan.

## 2021-11-07 ENCOUNTER — Ambulatory Visit: Admit: 2021-11-07 | Discharge: 2021-11-07 | Payer: MEDICARE | Attending: Family Medicine | Primary: Family Medicine

## 2021-11-07 DIAGNOSIS — R0602 Shortness of breath: Secondary | ICD-10-CM

## 2021-11-07 NOTE — ACP (Advance Care Planning) (Signed)
Has ACP

## 2021-11-07 NOTE — Progress Notes (Signed)
Caleb Robinson (DOB:  1951/11/30) is a 70 y.o. male,Established patient, here for evaluation of the following chief complaint(s):  Shortness of Breath (       ASSESSMENT/PLAN:  1. Shortness of breath  -     XR CHEST PA LAT (2 VIEWS); Future  2. Acute midline thoracic back pain  -     XR THORACIC SPINE (2 VIEWS); Future  3. Centrilobular emphysema (HCC)  -     XR CHEST PA LAT (2 VIEWS); Future  -     XR THORACIC SPINE (2 VIEWS); Future  4. History of tongue cancer  -     XR CHEST PA LAT (2 VIEWS); Future  -     XR THORACIC SPINE (2 VIEWS); Future    No follow-ups on file.         Subjective   SUBJECTIVE/OBJECTIVE:  HPI  Started getting worse this weekend and is feeling SOB all the time. ), Facial Swelling (Started a month ago bilateral swelling on jaw line ), Fall (Had a fall and roommate stated he hit his head, after fall BP was 76/30 something, thinks fall was caused by low BP ), and Back Pain (Every night patients back starts to hurt right around shoulder line, sometimes the pain with going up the spin and sometimes the pain will go down the spine. )  Shortness of breath  Follow up with pulm.    Facial swelling:    Back pain:  Upper    Fall:  Likely 2/2 low bp             Objective   Physical Exam     Vitals:    11/07/21 1613   BP: (!) 86/57   Pulse:    Resp:    Temp:    SpO2:        On this date 11/07/2021 I have spent 30 minutes reviewing previous notes, test results and face to face with the patient discussing the diagnosis and importance of compliance with the treatment plan as well as documenting on the day of the visit.      An electronic signature was used to authenticate this note.    --[name], MD, MD

## 2021-11-08 ENCOUNTER — Encounter

## 2021-12-19 ENCOUNTER — Inpatient Hospital Stay: Admit: 2021-12-19 | Payer: MEDICARE | Primary: Family Medicine

## 2021-12-19 LAB — HEMOGLOBIN AND HEMATOCRIT
Hematocrit: 20.5 % — ABNORMAL LOW (ref 41.1–50.3)
Hemoglobin: 6.6 g/dL — CL (ref 13.6–17.2)

## 2021-12-31 ENCOUNTER — Encounter: Payer: MEDICARE | Attending: Family Medicine | Primary: Family Medicine

## 2022-01-03 ENCOUNTER — Inpatient Hospital Stay: Admit: 2022-01-03 | Payer: MEDICARE | Primary: Family Medicine

## 2022-01-03 LAB — VANCOMYCIN LEVEL, TROUGH: Vancomycin Tr: 22.7 ug/mL (ref 5–20)

## 2022-01-30 ENCOUNTER — Encounter: Payer: MEDICARE | Attending: Critical Care Medicine | Primary: Family Medicine

## 2022-02-04 NOTE — Progress Notes (Signed)
Office informed by patient's ex-wife  Caleb Robinson. Obituary:

## 2022-02-06 DEATH — deceased
